# Patient Record
Sex: Female | Born: 1935 | Race: White | Hispanic: No | Smoking: Former smoker
Health system: Southern US, Community
[De-identification: ages and names within clinical notes are randomized; demographics above are authoritative.]

## PROBLEM LIST (undated history)

## (undated) DIAGNOSIS — Z800 Family history of malignant neoplasm of digestive organs: Secondary | ICD-10-CM

## (undated) DIAGNOSIS — Z803 Family history of malignant neoplasm of breast: Secondary | ICD-10-CM

## (undated) DIAGNOSIS — N183 Chronic kidney disease, stage 3 unspecified: Secondary | ICD-10-CM

## (undated) DIAGNOSIS — K56609 Unspecified intestinal obstruction, unspecified as to partial versus complete obstruction: Secondary | ICD-10-CM

## (undated) DIAGNOSIS — M8580 Other specified disorders of bone density and structure, unspecified site: Secondary | ICD-10-CM

## (undated) HISTORY — DX: Family history of malignant neoplasm of breast: Z80.3

## (undated) HISTORY — DX: Family history of malignant neoplasm of digestive organs: Z80.0

## (undated) HISTORY — PX: GLAUCOMA SURGERY: SHX656

## (undated) HISTORY — PX: COLECTOMY: SHX59

## (undated) HISTORY — PX: HIP SURGERY: SHX245

## (undated) HISTORY — PX: APPENDECTOMY: SHX54

## (undated) HISTORY — DX: Other specified disorders of bone density and structure, unspecified site: M85.80

---

## 1998-08-14 ENCOUNTER — Ambulatory Visit (HOSPITAL_COMMUNITY): Admission: RE | Admit: 1998-08-14 | Discharge: 1998-08-14 | Payer: Self-pay | Admitting: Gastroenterology

## 1999-08-10 ENCOUNTER — Emergency Department (HOSPITAL_COMMUNITY): Admission: EM | Admit: 1999-08-10 | Discharge: 1999-08-10 | Payer: Self-pay | Admitting: Emergency Medicine

## 1999-09-10 ENCOUNTER — Encounter: Payer: Self-pay | Admitting: Family Medicine

## 1999-09-10 ENCOUNTER — Encounter: Admission: RE | Admit: 1999-09-10 | Discharge: 1999-09-10 | Payer: Self-pay | Admitting: Family Medicine

## 2000-09-14 ENCOUNTER — Encounter: Payer: Self-pay | Admitting: Internal Medicine

## 2000-09-14 ENCOUNTER — Encounter: Admission: RE | Admit: 2000-09-14 | Discharge: 2000-09-14 | Payer: Self-pay | Admitting: Internal Medicine

## 2001-07-22 ENCOUNTER — Emergency Department (HOSPITAL_COMMUNITY): Admission: EM | Admit: 2001-07-22 | Discharge: 2001-07-23 | Payer: Self-pay | Admitting: Anesthesiology

## 2001-07-23 ENCOUNTER — Ambulatory Visit (HOSPITAL_COMMUNITY): Admission: RE | Admit: 2001-07-23 | Discharge: 2001-07-23 | Payer: Self-pay

## 2001-07-23 ENCOUNTER — Encounter: Payer: Self-pay | Admitting: Emergency Medicine

## 2001-09-28 ENCOUNTER — Encounter: Payer: Self-pay | Admitting: *Deleted

## 2001-09-28 ENCOUNTER — Encounter: Admission: RE | Admit: 2001-09-28 | Discharge: 2001-09-28 | Payer: Self-pay | Admitting: *Deleted

## 2001-10-06 ENCOUNTER — Other Ambulatory Visit: Admission: RE | Admit: 2001-10-06 | Discharge: 2001-10-06 | Payer: Self-pay | Admitting: *Deleted

## 2002-03-13 ENCOUNTER — Ambulatory Visit (HOSPITAL_COMMUNITY): Admission: RE | Admit: 2002-03-13 | Discharge: 2002-03-13 | Payer: Self-pay | Admitting: Gastroenterology

## 2002-03-13 ENCOUNTER — Encounter (INDEPENDENT_AMBULATORY_CARE_PROVIDER_SITE_OTHER): Payer: Self-pay

## 2002-10-02 ENCOUNTER — Encounter: Admission: RE | Admit: 2002-10-02 | Discharge: 2002-10-02 | Payer: Self-pay | Admitting: Family Medicine

## 2002-10-02 ENCOUNTER — Encounter: Payer: Self-pay | Admitting: Family Medicine

## 2003-10-04 ENCOUNTER — Ambulatory Visit (HOSPITAL_COMMUNITY): Admission: RE | Admit: 2003-10-04 | Discharge: 2003-10-04 | Payer: Self-pay | Admitting: Family Medicine

## 2004-10-07 ENCOUNTER — Ambulatory Visit (HOSPITAL_COMMUNITY): Admission: RE | Admit: 2004-10-07 | Discharge: 2004-10-07 | Payer: Self-pay | Admitting: *Deleted

## 2005-03-19 ENCOUNTER — Other Ambulatory Visit: Admission: RE | Admit: 2005-03-19 | Discharge: 2005-03-19 | Payer: Self-pay | Admitting: *Deleted

## 2005-12-17 ENCOUNTER — Ambulatory Visit (HOSPITAL_COMMUNITY): Admission: RE | Admit: 2005-12-17 | Discharge: 2005-12-17 | Payer: Self-pay | Admitting: *Deleted

## 2006-12-21 ENCOUNTER — Ambulatory Visit (HOSPITAL_COMMUNITY): Admission: RE | Admit: 2006-12-21 | Discharge: 2006-12-21 | Payer: Self-pay | Admitting: *Deleted

## 2008-01-10 ENCOUNTER — Ambulatory Visit (HOSPITAL_COMMUNITY): Admission: RE | Admit: 2008-01-10 | Discharge: 2008-01-10 | Payer: Self-pay | Admitting: *Deleted

## 2008-02-16 ENCOUNTER — Encounter: Admission: RE | Admit: 2008-02-16 | Discharge: 2008-02-16 | Payer: Self-pay | Admitting: Family Medicine

## 2008-02-24 ENCOUNTER — Encounter: Admission: RE | Admit: 2008-02-24 | Discharge: 2008-02-24 | Payer: Self-pay | Admitting: Family Medicine

## 2008-06-12 ENCOUNTER — Encounter: Admission: RE | Admit: 2008-06-12 | Discharge: 2008-06-12 | Payer: Self-pay | Admitting: Family Medicine

## 2009-01-11 ENCOUNTER — Ambulatory Visit (HOSPITAL_COMMUNITY): Admission: RE | Admit: 2009-01-11 | Discharge: 2009-01-11 | Payer: Self-pay | Admitting: Family Medicine

## 2009-03-28 ENCOUNTER — Encounter: Admission: RE | Admit: 2009-03-28 | Discharge: 2009-03-28 | Payer: Self-pay | Admitting: Family Medicine

## 2009-10-28 ENCOUNTER — Emergency Department (HOSPITAL_COMMUNITY): Admission: EM | Admit: 2009-10-28 | Discharge: 2009-10-29 | Payer: Self-pay | Admitting: Emergency Medicine

## 2010-01-13 ENCOUNTER — Ambulatory Visit (HOSPITAL_COMMUNITY): Admission: RE | Admit: 2010-01-13 | Discharge: 2010-01-13 | Payer: Self-pay | Admitting: Family Medicine

## 2010-05-01 ENCOUNTER — Encounter: Admission: RE | Admit: 2010-05-01 | Discharge: 2010-05-01 | Payer: Self-pay | Admitting: Family Medicine

## 2010-06-13 ENCOUNTER — Ambulatory Visit: Payer: Self-pay | Admitting: Vascular Surgery

## 2010-10-15 LAB — BASIC METABOLIC PANEL
BUN: 25 mg/dL — ABNORMAL HIGH (ref 6–23)
CO2: 25 mEq/L (ref 19–32)
Glucose, Bld: 147 mg/dL — ABNORMAL HIGH (ref 70–99)
Potassium: 3.9 mEq/L (ref 3.5–5.1)
Sodium: 133 mEq/L — ABNORMAL LOW (ref 135–145)

## 2010-10-15 LAB — URINALYSIS, ROUTINE W REFLEX MICROSCOPIC
Bilirubin Urine: NEGATIVE
Glucose, UA: NEGATIVE mg/dL
Hgb urine dipstick: NEGATIVE
Ketones, ur: NEGATIVE mg/dL
pH: 5 (ref 5.0–8.0)

## 2010-10-15 LAB — DIFFERENTIAL
Basophils Absolute: 0 10*3/uL (ref 0.0–0.1)
Lymphocytes Relative: 6 % — ABNORMAL LOW (ref 12–46)
Lymphs Abs: 0.9 10*3/uL (ref 0.7–4.0)
Monocytes Absolute: 0.6 10*3/uL (ref 0.1–1.0)
Monocytes Relative: 4 % (ref 3–12)

## 2010-10-15 LAB — CBC
HCT: 31.9 % — ABNORMAL LOW (ref 36.0–46.0)
MCHC: 33.2 g/dL (ref 30.0–36.0)
RDW: 13.1 % (ref 11.5–15.5)

## 2010-11-27 ENCOUNTER — Other Ambulatory Visit: Payer: Self-pay | Admitting: Gastroenterology

## 2010-12-12 NOTE — Op Note (Signed)
   TNAMEGLADA, MATCHETTE                     ACCOUNT NO.:  [id number]   MEDICAL RECORD NO.:  [id number]                   PATIENT TYPE:  AMB   LOCATION:  ENDO                                 FACILITY:  [facility]   PHYSICIAN:  Missy Sabins, M.D.                  DATE OF BIRTH:  12-31-1931   DATE OF PROCEDURE:  03/13/2002  DATE OF DISCHARGE:                                 OPERATIVE REPORT   PROCEDURE:  Colonoscopy with polypectomy.   INDICATIONS FOR PROCEDURE:  History of adenomatous colon polyps.   DESCRIPTION OF PROCEDURE:  The patient was placed in the left lateral  decubitus position then placed on the pulse monitor with continuous low flow  oxygen delivered by nasal cannula. She was sedated with 70 mg IV Demerol and  7 mg IV Versed. The Olympus video colonoscope was inserted into the rectum  and advanced to the cecum, confirmed by transillumination at McBurney's  point and visualization of the ileocecal valve and appendiceal orifice. The  prep was generally good. The cecum and ascending colon appeared normal with  no masses, polyps, diverticula or other mucosal abnormalities. We looked in  the transverse colon and there was seen a 1.5 cm polyp that was removed by  snare. Opposite this, there was an 8 mm polyp which was removed by hot  biopsy. The remainder of the transverse, descending, sigmoid and rectum  appeared normal without further polyps, masses, diverticula or other mucosal  abnormalities. Within the rectum, there was an 8 mm sessile polyp which was  fulgurated by hot biopsy. The scope was then withdrawn and the patient  returned to the recovery room in stable condition. The patient tolerated the  procedure well and there were no immediate complications.   IMPRESSION:  Transverse and rectal polyps.   PLAN:  Await histology for determination of interval for next colonoscopy.                                               Missy Sabins, M.D.    JCH/MEDQ  D:   03/13/2002  T:  03/13/2002  Job:  [id number]

## 2010-12-22 DIAGNOSIS — K56699 Other intestinal obstruction unspecified as to partial versus complete obstruction: Secondary | ICD-10-CM | POA: Insufficient documentation

## 2010-12-24 ENCOUNTER — Other Ambulatory Visit: Payer: Self-pay | Admitting: Gastroenterology

## 2010-12-24 ENCOUNTER — Other Ambulatory Visit (HOSPITAL_COMMUNITY): Payer: Self-pay | Admitting: Gastroenterology

## 2010-12-24 DIAGNOSIS — R197 Diarrhea, unspecified: Secondary | ICD-10-CM

## 2010-12-25 ENCOUNTER — Ambulatory Visit (HOSPITAL_COMMUNITY)
Admission: RE | Admit: 2010-12-25 | Discharge: 2010-12-25 | Disposition: A | Discharge status: Home / Self Care | Payer: Medicare Other | Source: Ambulatory Visit | Attending: Gastroenterology | Admitting: Gastroenterology

## 2010-12-25 ENCOUNTER — Inpatient Hospital Stay
Admission: RE | Admit: 2010-12-25 | Discharge: 2010-12-25 | Payer: Self-pay | Source: Ambulatory Visit | Attending: Gastroenterology | Admitting: Gastroenterology

## 2010-12-25 DIAGNOSIS — K449 Diaphragmatic hernia without obstruction or gangrene: Secondary | ICD-10-CM | POA: Insufficient documentation

## 2010-12-25 DIAGNOSIS — R197 Diarrhea, unspecified: Secondary | ICD-10-CM

## 2010-12-25 DIAGNOSIS — M47817 Spondylosis without myelopathy or radiculopathy, lumbosacral region: Secondary | ICD-10-CM | POA: Insufficient documentation

## 2010-12-25 DIAGNOSIS — N269 Renal sclerosis, unspecified: Secondary | ICD-10-CM | POA: Insufficient documentation

## 2010-12-25 DIAGNOSIS — I7090 Unspecified atherosclerosis: Secondary | ICD-10-CM | POA: Insufficient documentation

## 2010-12-25 DIAGNOSIS — K639 Disease of intestine, unspecified: Secondary | ICD-10-CM | POA: Insufficient documentation

## 2010-12-25 DIAGNOSIS — N83209 Unspecified ovarian cyst, unspecified side: Secondary | ICD-10-CM | POA: Insufficient documentation

## 2010-12-25 MED ORDER — IOHEXOL 300 MG/ML  SOLN
100.0000 mL | Freq: Once | INTRAMUSCULAR | Status: AC | PRN
  Administered 2010-12-25: 100 mL via INTRAVENOUS

## 2011-01-14 ENCOUNTER — Other Ambulatory Visit (HOSPITAL_COMMUNITY): Payer: Self-pay | Admitting: Family Medicine

## 2011-01-14 DIAGNOSIS — Z1231 Encounter for screening mammogram for malignant neoplasm of breast: Secondary | ICD-10-CM

## 2011-01-19 ENCOUNTER — Other Ambulatory Visit: Payer: Self-pay | Admitting: Gastroenterology

## 2011-01-22 ENCOUNTER — Other Ambulatory Visit: Payer: Medicare Other

## 2011-01-23 ENCOUNTER — Ambulatory Visit (HOSPITAL_COMMUNITY): Payer: Medicare Other

## 2011-01-27 ENCOUNTER — Ambulatory Visit (HOSPITAL_COMMUNITY)
Admission: RE | Admit: 2011-01-27 | Discharge: 2011-01-27 | Disposition: A | Discharge status: Home / Self Care | Payer: Medicare Other | Source: Ambulatory Visit | Attending: Family Medicine | Admitting: Family Medicine

## 2011-01-27 ENCOUNTER — Ambulatory Visit
Admission: RE | Admit: 2011-01-27 | Discharge: 2011-01-27 | Disposition: A | Discharge status: Home / Self Care | Payer: Medicare Other | Source: Ambulatory Visit | Attending: Gastroenterology | Admitting: Gastroenterology

## 2011-01-27 DIAGNOSIS — Z1231 Encounter for screening mammogram for malignant neoplasm of breast: Secondary | ICD-10-CM | POA: Insufficient documentation

## 2011-01-27 MED ORDER — IOHEXOL 300 MG/ML  SOLN
100.0000 mL | Freq: Once | INTRAMUSCULAR | Status: AC | PRN
  Administered 2011-01-27: 100 mL via INTRAVENOUS

## 2011-02-04 ENCOUNTER — Other Ambulatory Visit: Payer: Self-pay | Admitting: Family Medicine

## 2011-02-04 DIAGNOSIS — R928 Other abnormal and inconclusive findings on diagnostic imaging of breast: Secondary | ICD-10-CM

## 2011-02-06 ENCOUNTER — Ambulatory Visit
Admission: RE | Admit: 2011-02-06 | Discharge: 2011-02-06 | Disposition: A | Discharge status: Home / Self Care | Payer: Medicare Other | Source: Ambulatory Visit | Attending: Family Medicine | Admitting: Family Medicine

## 2011-02-06 DIAGNOSIS — R928 Other abnormal and inconclusive findings on diagnostic imaging of breast: Secondary | ICD-10-CM

## 2011-03-06 ENCOUNTER — Other Ambulatory Visit: Payer: Self-pay | Admitting: Gastroenterology

## 2011-03-23 ENCOUNTER — Encounter (INDEPENDENT_AMBULATORY_CARE_PROVIDER_SITE_OTHER): Payer: Self-pay | Admitting: Surgery

## 2011-03-24 ENCOUNTER — Encounter (INDEPENDENT_AMBULATORY_CARE_PROVIDER_SITE_OTHER): Payer: Self-pay | Admitting: Surgery

## 2011-03-24 ENCOUNTER — Ambulatory Visit (INDEPENDENT_AMBULATORY_CARE_PROVIDER_SITE_OTHER): Payer: Medicare Other | Admitting: Surgery

## 2011-03-24 VITALS — BP 122/68 | HR 76 | Temp 96.7°F | Ht 60.0 in | Wt 114.6 lb

## 2011-03-24 DIAGNOSIS — K56609 Unspecified intestinal obstruction, unspecified as to partial versus complete obstruction: Secondary | ICD-10-CM

## 2011-03-24 DIAGNOSIS — K56699 Other intestinal obstruction unspecified as to partial versus complete obstruction: Secondary | ICD-10-CM

## 2011-03-24 NOTE — Progress Notes (Signed)
Chief Complaint  Patient presents with  . Other    Eval of col mass possible benign    HPI Laurie [name] is a 77 y.o. female.  This patient comes over for evaluation of a problem with her sigmoid colon. She is referred by [name].  She began having some symptoms of loose stools after E. and rectal bleeding with weight loss and was evaluated in April of 2012. At that time she had a slightly low hemoglobin of 10.8. She subsequently had an upper endoscopy which was negative and biopsies showed no features of sprue or active inflammation. She then underwent a colonoscopy which showed mild inflammation in the area of the sigmoid colon. A CT scan showed what looked like an apple core lesion in the area. Biopsies of the sigmoid showed mild inflammation but no evidence of tumor. She was treated with a couple of courses of Cipro and Flagyl. All of her abdominal symptoms have resolved and her abdominal pain is gone and she is having no problems with bowel movements. She returned for subsequent followup and had a second CT scan which basically unchanged from the original. Another attempt at colonoscopy showed inflammation at the area of the sigmoid and could not be passed. She has not had a barium enema.  She comes in today to discuss whether any surgical intervention is appropriate. She remains asymptomatic in terms of her abdomen. HPI  Past Medical History  Diagnosis Date  . Osteopenia   . Glaucoma   . Family history of breast cancer     mother  . Family history of colon cancer     father    Past Surgical History  Procedure Date  . Appendectomy   . Cesarean section     x6    Family History  Problem Relation Age of Onset  . Colon cancer Father   . Breast cancer Mother   . Diabetes Sister     Social History History  Substance Use Topics  . Smoking status: Current Everyday Smoker -- 0.5 packs/day  . Smokeless tobacco: Not on file  . Alcohol Use: No     rare    Allergies  Allergen Reactions  . Actonel Nausea And Vomiting  . Fosamax Nausea And Vomiting  . Miacalcin   . Neosporin (Neomycin-Polymyx-Gramicid) Swelling  . Codeine Palpitations    Current Outpatient Prescriptions  Medication Sig Dispense Refill  . aspirin 81 MG tablet Take 81 mg by mouth daily.        . dorzolamide-timolol (COSOPT) 22.3-6.8 MG/ML ophthalmic solution 1 drop 2 (two) times daily.        . Vitamin D, Ergocalciferol, (DRISDOL) 50000 UNITS CAPS Take 50,000 Units by mouth.        . hyoscyamine (ANASPAZ) 0.125 MG TBDP Place 0.125 mg under the tongue as needed.        . Travoprost, BAK Free, (TRAVATAMN) 0.004 % SOLN ophthalmic solution 1 drop at bedtime.          Review of Systems ROSHer 15 point ROS is basically negative  Blood pressure 122/68, pulse 76, temperature 96.7 F (35.9 C), temperature source Temporal, height 5' (1.524 m), weight 114 lb 9.6 oz (51.982 kg).  Physical Exam Physical Exam  GENERAL: The patient is alert, oriented, and generally healthy-appearing, NAD. Mood and affect are normal.  HEENT: The head is normocephalic, the eyes nonicteric, the pupils were round regular and equal. EOMs are normal. Pharynx normal. Dentition good.  NECK: The  neck is supple and there are no masses or thyromegaly.  LUNGS: Normal respirations and clear to auscultation.  HEART: Regular rhythm, with no murmurs rubs or gallops. Pulses are intact carotid dorsalis pedis and posterior tibial. No significant varicosities are noted.  ABDOMEN: Soft, flat, and nontender. No masses or organomegaly is noted. No hernias are noted. Bowel sounds are normal.There is some question of some tubular thickening in the LLQ in the presumed location of the sigmoid colon  EXTREMITIES: Good range of motion, no edema.  Data Reviewed I've reviewed over office notes from three or four office visits with her gastroenterologist, her pathology reports, her CT scan reports and films, and  her endoscopy/colonoscopy notes. Assessment    She appears to have some abnormality in the distal sigmoid area that looks like some form of a stricture. At least by the colonoscopy there did not appear to be an intrinsic mass in the colon although the CT scan is certainly suggestive for a colon carcinoma.    Plan    I think she is likely going to need exploration, possibly laparoscopic. However I would like to have a barium enema just to see if we can make any better definition of this area and to see if it would distend any under pressure. I got over that with the patient and she is agreeable to that plan. We will try to get this set up to be done at her convenience.       Laurie [name] 03/24/2011, 2:59 PM

## 2011-04-01 ENCOUNTER — Other Ambulatory Visit (INDEPENDENT_AMBULATORY_CARE_PROVIDER_SITE_OTHER): Payer: Self-pay | Admitting: Surgery

## 2011-04-01 ENCOUNTER — Ambulatory Visit
Admission: RE | Admit: 2011-04-01 | Discharge: 2011-04-01 | Disposition: A | Discharge status: Home / Self Care | Payer: Medicare Other | Source: Ambulatory Visit | Attending: Surgery | Admitting: Surgery

## 2011-04-01 DIAGNOSIS — K56699 Other intestinal obstruction unspecified as to partial versus complete obstruction: Secondary | ICD-10-CM

## 2011-04-03 ENCOUNTER — Telehealth (INDEPENDENT_AMBULATORY_CARE_PROVIDER_SITE_OTHER): Payer: Self-pay | Admitting: Surgery

## 2011-04-03 NOTE — Telephone Encounter (Signed)
I reviewed her barium enema films. There is a long stricture of uncertain etiology. Combined with the previous information I think she needs an exploratory laparotomy and sigmoid resection. Although she is asymptomatic I'm concerned about the length of the stricture and the potential for some extrinsic tumor although this could represent, I suppose, a long diverticular stricture.  I have left her message to give me a call since she was not home when I called her today. I reviewed with her at that point in time.

## 2011-04-06 ENCOUNTER — Telehealth (INDEPENDENT_AMBULATORY_CARE_PROVIDER_SITE_OTHER): Payer: Self-pay | Admitting: Surgery

## 2011-04-06 NOTE — Telephone Encounter (Signed)
Patient called re: speaking to Dr Margot Chimes. I informed her he would like to speak with her about her test results. He will be in the office tomorrow and I will have him call her. She has some appts but will be available after 3 pm.

## 2011-04-07 ENCOUNTER — Telehealth (INDEPENDENT_AMBULATORY_CARE_PROVIDER_SITE_OTHER): Payer: Self-pay | Admitting: Surgery

## 2011-04-07 DIAGNOSIS — K56699 Other intestinal obstruction unspecified as to partial versus complete obstruction: Secondary | ICD-10-CM

## 2011-04-07 NOTE — Telephone Encounter (Signed)
I called the patient today and reviewed the findings of her barium enema. Basically shows a sigmoid stricture with what appears to be perhaps extrinsic compression.  I told her that based on this, and the findings at her colonoscopy, that she on to undergo sigmoid colectomy. I went over the surgery including risks and complications. I did tell her that surgery I think she may at some point obstruct. I also told her that we don't have a clear etiology, this could be chronic inflammatory changes from diverticular disease or some sort of cancer.  I told her we will try to do this as a laparoscopic assisted, but that she might need a standard open operation. I told her to expect up to six or eight days in the hospital and at least six weeks of recovery to a full recovery.  I think all questions have been answered. We will try to set this up to be done at her convenience.

## 2011-04-20 ENCOUNTER — Telehealth (INDEPENDENT_AMBULATORY_CARE_PROVIDER_SITE_OTHER): Payer: Self-pay | Admitting: General Surgery

## 2011-04-20 NOTE — Telephone Encounter (Signed)
Patient called to see if she could mix her prep with apple juice instead of gatorade. Per Dr Zella Richer the enzymes in the gatorade are needed and the pectin in apple juice would be bad. She needs to use gatorade. Patient agrees with this.

## 2011-04-24 ENCOUNTER — Encounter (HOSPITAL_COMMUNITY)
Admission: RE | Admit: 2011-04-24 | Discharge: 2011-04-24 | Disposition: A | Discharge status: Home / Self Care | Payer: Medicare Other | Source: Ambulatory Visit | Attending: Surgery | Admitting: Surgery

## 2011-04-24 ENCOUNTER — Other Ambulatory Visit (INDEPENDENT_AMBULATORY_CARE_PROVIDER_SITE_OTHER): Payer: Self-pay | Admitting: Surgery

## 2011-04-24 ENCOUNTER — Telehealth (INDEPENDENT_AMBULATORY_CARE_PROVIDER_SITE_OTHER): Payer: Self-pay

## 2011-04-24 ENCOUNTER — Telehealth (INDEPENDENT_AMBULATORY_CARE_PROVIDER_SITE_OTHER): Payer: Self-pay | Admitting: General Surgery

## 2011-04-24 DIAGNOSIS — K56609 Unspecified intestinal obstruction, unspecified as to partial versus complete obstruction: Secondary | ICD-10-CM

## 2011-04-24 LAB — CBC
Hemoglobin: 12.5 g/dL (ref 12.0–15.0)
MCH: 30.1 pg (ref 26.0–34.0)
Platelets: 317 10*3/uL (ref 150–400)
RBC: 4.15 MIL/uL (ref 3.87–5.11)
WBC: 11.4 10*3/uL — ABNORMAL HIGH (ref 4.0–10.5)

## 2011-04-24 LAB — URINE MICROSCOPIC-ADD ON

## 2011-04-24 LAB — URINALYSIS, ROUTINE W REFLEX MICROSCOPIC
Bilirubin Urine: NEGATIVE
Ketones, ur: NEGATIVE mg/dL
Specific Gravity, Urine: 1.015 (ref 1.005–1.030)
pH: 5 (ref 5.0–8.0)

## 2011-04-24 LAB — DIFFERENTIAL
Basophils Absolute: 0 10*3/uL (ref 0.0–0.1)
Basophils Relative: 0 % (ref 0–1)
Eosinophils Absolute: 0.3 10*3/uL (ref 0.0–0.7)
Monocytes Relative: 8 % (ref 3–12)
Neutro Abs: 7.8 10*3/uL — ABNORMAL HIGH (ref 1.7–7.7)
Neutrophils Relative %: 69 % (ref 43–77)

## 2011-04-24 LAB — COMPREHENSIVE METABOLIC PANEL
ALT: 9 U/L (ref 0–35)
AST: 12 U/L (ref 0–37)
Alkaline Phosphatase: 79 U/L (ref 39–117)
CO2: 28 mEq/L (ref 19–32)
Calcium: 9.3 mg/dL (ref 8.4–10.5)
GFR calc non Af Amer: 36 mL/min — ABNORMAL LOW (ref >60)
Potassium: 3.8 mEq/L (ref 3.5–5.1)
Sodium: 140 mEq/L (ref 135–145)

## 2011-04-24 LAB — SURGICAL PCR SCREEN
MRSA, PCR: NEGATIVE
Staphylococcus aureus: POSITIVE — AB

## 2011-04-24 NOTE — Telephone Encounter (Signed)
Labs okay for surgery faxed to pre-op.

## 2011-04-24 NOTE — Telephone Encounter (Signed)
Message copied by Margarette Asal on Fri Apr 24, 2011  2:39 PM ------      Message from: Haywood Lasso      Created: Fri Apr 24, 2011  2:20 PM       These labs are OK for surgery

## 2011-04-24 NOTE — Telephone Encounter (Signed)
The Pharmacist called and stated the pt is allergic to Neomycin.  She was prescribed this for her bowel prep.  I spoke to Dr Margot Chimes and he advised to omit that part of the prescription and just give Erythromycin.  I called this to [facility]

## 2011-04-29 ENCOUNTER — Telehealth (INDEPENDENT_AMBULATORY_CARE_PROVIDER_SITE_OTHER): Payer: Self-pay | Admitting: General Surgery

## 2011-04-29 NOTE — Telephone Encounter (Signed)
Called pt to advise that the bowel prep was to remain the same based on doctor instruction. Advised her she had to complete the prep and antibiotic prescription as instructed in order to avoid complications from surgery.   Instruction regarding bowel prep was per Dr. Donne Hazel who was given a copy of the sx order for review prior to calling pt with answer.

## 2011-04-29 NOTE — Telephone Encounter (Signed)
Pt called in complaining of nausea due to bowel prep. Stated she has not started taking her antibiotic either. Stated she tried eating some chicken soup about an hour ago to see if it would help her. Advised patient prep needed to be completed, in addition to taking the antibiotic prescribed in order to be ready for surgery tomorrow. Advised pt I would call her back at [phone] as soon as I was able to get a doctor to approve a prescription for anti nausea medication.

## 2011-04-30 ENCOUNTER — Inpatient Hospital Stay (HOSPITAL_COMMUNITY)
Admission: RE | Admit: 2011-04-30 | Discharge: 2011-05-06 | DRG: 330 | Disposition: A | Discharge status: Home / Self Care | Payer: Medicare Other | Source: Ambulatory Visit | Attending: Surgery | Admitting: Surgery

## 2011-04-30 ENCOUNTER — Encounter (INDEPENDENT_AMBULATORY_CARE_PROVIDER_SITE_OTHER): Payer: Self-pay | Admitting: Surgery

## 2011-04-30 ENCOUNTER — Other Ambulatory Visit (INDEPENDENT_AMBULATORY_CARE_PROVIDER_SITE_OTHER): Payer: Self-pay | Admitting: Surgery

## 2011-04-30 DIAGNOSIS — K5669 Other intestinal obstruction: Secondary | ICD-10-CM | POA: Diagnosis present

## 2011-04-30 DIAGNOSIS — Z5331 Laparoscopic surgical procedure converted to open procedure: Secondary | ICD-10-CM

## 2011-04-30 DIAGNOSIS — K5732 Diverticulitis of large intestine without perforation or abscess without bleeding: Principal | ICD-10-CM | POA: Diagnosis present

## 2011-04-30 DIAGNOSIS — Z23 Encounter for immunization: Secondary | ICD-10-CM

## 2011-04-30 DIAGNOSIS — D282 Benign neoplasm of uterine tubes and ligaments: Secondary | ICD-10-CM

## 2011-04-30 DIAGNOSIS — Z87891 Personal history of nicotine dependence: Secondary | ICD-10-CM

## 2011-04-30 DIAGNOSIS — Z860100 Personal history of colon polyps, unspecified: Secondary | ICD-10-CM

## 2011-04-30 DIAGNOSIS — Z01812 Encounter for preprocedural laboratory examination: Secondary | ICD-10-CM

## 2011-04-30 DIAGNOSIS — Z01818 Encounter for other preprocedural examination: Secondary | ICD-10-CM

## 2011-04-30 HISTORY — PX: OTHER SURGICAL HISTORY: SHX169

## 2011-04-30 HISTORY — PX: LEFT COLECTOMY: SHX856

## 2011-04-30 LAB — TYPE AND SCREEN: ABO/RH(D): B NEG

## 2011-05-02 LAB — BASIC METABOLIC PANEL
BUN: 12 mg/dL (ref 6–23)
Calcium: 8.5 mg/dL (ref 8.4–10.5)
Creatinine, Ser: 1.26 mg/dL — ABNORMAL HIGH (ref 0.50–1.10)
GFR calc non Af Amer: 40 mL/min — ABNORMAL LOW (ref >90)
Glucose, Bld: 122 mg/dL — ABNORMAL HIGH (ref 70–99)

## 2011-05-02 LAB — CBC
HCT: 30.2 % — ABNORMAL LOW (ref 36.0–46.0)
Hemoglobin: 10.1 g/dL — ABNORMAL LOW (ref 12.0–15.0)
MCH: 29.7 pg (ref 26.0–34.0)
MCHC: 33.4 g/dL (ref 30.0–36.0)
MCV: 88.8 fL (ref 78.0–100.0)

## 2011-05-03 LAB — BASIC METABOLIC PANEL
BUN: 7 mg/dL (ref 6–23)
Calcium: 8.5 mg/dL (ref 8.4–10.5)
Chloride: 103 mEq/L (ref 96–112)
Creatinine, Ser: 1.04 mg/dL (ref 0.50–1.10)
GFR calc Af Amer: 59 mL/min — ABNORMAL LOW (ref >90)
GFR calc non Af Amer: 50 mL/min — ABNORMAL LOW (ref >90)

## 2011-05-06 NOTE — Discharge Summary (Signed)
  NAMEELTA, KAZAR NO.:  [id number]  MEDICAL RECORD NO.:  [id number]  LOCATION:  [id number]                         FACILITY:  [facility]  PHYSICIAN:  Haywood Lasso, M.D.DATE OF BIRTH:  1932/01/24  DATE OF ADMISSION:  04/30/2011 DATE OF DISCHARGE:  05/06/2011                              DISCHARGE SUMMARY   FINAL DIAGNOSIS:  Severe left colonic diverticulitis and diverticulosis with stricture. Also Serous cystadenoma left fallopian tube  CLINICAL HISTORY:  This patient was recently diagnosed with a significant sigmoid colon obstruction that was near-complete, although she was relatively asymptomatic.  It was thought to be more inflammatory than malignant.  After discussion with the patient, she was elected to be admitted for resection.  HOSPITAL COURSE:  The patient was admitted and taken to the operating room.  Operative findings including a very long inflammatory process of the sigmoid colon looked chronic with structure extending basically from the proximal descending colon to the rectosigmoid junction.  She underwent a left hemicolectomy with takedown of her splenic flexure, and a low anterior anastomosis.  In addition, a fallopian tube cystic mass was seen and this was removed as well.  The patient had an unremarkable postoperative course.  She was kept n.p.o. for a couple of days, begun on liquids and gradually advanced to solids. She underwent normal respiratory toilet.  Her wound was healing nicely and her wicks were removed on the 5th postoperative day.  She is passing gas and having bowel movements and tolerating diet.  She requested to be discharged today and she is feeling well.  Pathology report confirmed diverticulitis with a benign ovarian cyst.  Plan is for her to go home today to be followed in my office next week. She knows to call if there are any problems or concerns.     Haywood Lasso, M.D.     CJS/MEDQ  D:  05/06/2011   T:  05/06/2011  Job:  [id number]  cc:   Elyse Jarvis. Amedeo Plenty, M.D.  Electronically Signed by Neldon Mc M.D. on 05/06/2011 07:17:20 PM

## 2011-05-06 NOTE — Op Note (Signed)
Laurie [name], Laurie [name]            ACCOUNT NO.:  [id number]  MEDICAL RECORD NO.:  [id number]  LOCATION:  [id number]                         FACILITY:  [facility]  PHYSICIAN:  Haywood Lasso, M.D.DATE OF BIRTH:  1932-05-14  DATE OF PROCEDURE:  04/30/2011 DATE OF DISCHARGE:                              OPERATIVE REPORT   PREOPERATIVE DIAGNOSIS:  Stricture of sigmoid colon likely diverticulitis.  POSTOPERATIVE DIAGNOSIS:  Stricture of sigmoid colon likely diverticulitis plus left fallopian tube cyst.  PROCEDURE: 1. Left hemicolectomy with takedown of splenic flexure and low     anterior anastomosis. 2. Resection of left fallopian tube cystic mass.  SURGEON:  Haywood Lasso, MD  ASSISTANT:  Dr. Rise Patience.  ANESTHESIA:  General endotracheal.  CLINICAL HISTORY:  This lady has a what appears to be stricture with almost an apple core lesion on CT that has become relatively asymptomatic, but because of its ongoing nature and the fact we were concerned there might be tumor there, we recommended that she have a sigmoid colectomy.  She had been scoped, but they were unable to get passed the scope past this area of obstruction.  DESCRIPTION OF PROCEDURE:  I saw the patient in the holding area and reviewed the plans for the surgery as noted above.  The patient was taken to the operating room, after satisfactory general endotracheal anesthesia had been obtained, Foley catheter was placed and the abdomen prepped and draped.  We put her in some modified lithotomy.  After the time-out was done I went ahead and made a small incision below the umbilicus and identified the fascia and entered the peritoneal cavity under direct vision.  A pursestring was placed, the Hasson introduced and the abdomen insufflated to 15.  Camera was placed and we saw there was some inflammatory process along the left side, but could not get good visualization.  Under direct vision, I put a 5-mm trocar in the  right upper and right lower quadrants.  Upon doing this, I was able to mobilize the small bowel off and see that we had a very chronically inflamed thickened sigmoid that had no mobility whatsoever.  We tried to move this around, tried to free up a little bit of peritoneum, but I decided this was going to be better done open.  The laparoscopic instruments were removed.  The midline incision was made from just above the umbilicus at her old C-section scar to just above the symphysis.  Omental adhesions of the upper midline were taken down and self-retaining retractor was placed.  I could feel that the area of involvement extended right down to the rectosigmoid junction inferiorly and at most to the left colon proximally.  I mobilized the left colon off of the lateral abdominal wall opening the line of Toldt towards the splenic flexure.  I could see clearly we need to take the splenic flexure down and visualization was not good, so I extended the incision another inch and a half above the umbilicus.  I then took all the omental adhesions down.  Once that was done, I was able to mobilize around the splenic flexure using a combination of cautery and LigaSure.  Once I had that down, I put  a pack up to make she was not having bleeding around the spleen.  We checked that two additional times later the case and things appeared to be dry.  There were multiple omental adhesions to the colon as well as the normal attachment of the omentum.  These were all taken down so I had good mobility.  I then found an area that was proximal to any of the thickening and I divided the bowel here with the GIA.  I then became divided the mesentery staying fairly close to the bowel as I thought this was all chronic diverticular disease and not cancer until we got to the mid descending colon.  Earlier I had freed up the sigmoid somewhat identified the ureter and I reidentified it to make sure we are going  to stay well away from that. I also identified the right ureter it was well retroperitoneal and lateral to where we were working.  I then continue to using the LigaSure, but any large vessels were tied or suture ligated until I got beyond the distal area of inflammatory process and where I was in the proximal rectum.  I checked this carefully make sure that we had a healthy bowel.  I then used a stapler (contour) to divide the bowel distally and retracted a low bit up into the rectal pouch.  I checked to make sure I had adequate mobility of the proximal colon and this laid easily down into the pelvis with no tension whatsoever.  I cleaned a little bit more the mesentery off where I had stapled it and put the pursestring device across, cut off the excess and placed a pursestring of Prolene.  We used a 33 stapler and I put the anvil in and tied the pursestring down a little bit and we are going to have a nice proximal area.  At this point, my assistant, Dr. Rise Patience, went below.  He dilated the rectal a little bit and then put the stapler up through the anus.  I could see the stapler impinging and coming up on stretching out the staple line of the rectum and the superior came through easily.  The colon was attached, the stapler closed and fired in the usual fashion. This produced 2 nice thick rings and no tension.  I obstruct that the proximal colon with a clamp and Dr. Rise Patience scoped the patient and the anastomosis looked intact.  We insufflated, there was no evidence of any air leak.  The rectum was then deflated.  Working in the pelvis, I did note that there was about a 3 cm cystic structure attached to the left fallopian tube and this was resected using the LigaSure and sent off as a separate specimen.  The main colon specimen was sent to the pathology to report this looked like all diverticular disease.  I spent several minutes irrigating.  I checked along the spleen again  to make sure that was dry along the entire length of the sigmoid colon, the colon this was coming down to the pelvis.  I did tack the mesentery posteriorly so the small bowel would not get out under it to cause an internal hernia.  Our anastomosis looked healthy and I used to dial projectile, we had good blood supply with a good pulse was coming to the colon from proximal colon.  We irrigated a final time.  Everything appeared to be dry.  The abdomen was then closed with a running 0 looped PDS with interrupted 0 Prolene  sutures on the fascia.  Subcu was irrigated and skin closed with staples with some Telfa wicks placed as well.  The patient tolerated procedure well.  There were no operative complications.  All counts were correct.  Estimated blood loss was 150 mL.     Haywood Lasso, M.D.     CJS/MEDQ  D:  04/30/2011  T:  04/30/2011  Job:  [id number]  cc:   Elyse Jarvis. Amedeo Plenty, M.D.  Electronically Signed by Neldon Mc M.D. on 05/06/2011 07:15:45 PM

## 2011-05-07 ENCOUNTER — Ambulatory Visit (HOSPITAL_COMMUNITY)
Admission: RE | Admit: 2011-05-07 | Discharge: 2011-05-07 | Disposition: A | Discharge status: Home / Self Care | Payer: Medicare Other | Source: Ambulatory Visit | Attending: Surgery | Admitting: Surgery

## 2011-05-07 ENCOUNTER — Other Ambulatory Visit (INDEPENDENT_AMBULATORY_CARE_PROVIDER_SITE_OTHER): Payer: Self-pay | Admitting: General Surgery

## 2011-05-07 DIAGNOSIS — M79651 Pain in right thigh: Secondary | ICD-10-CM

## 2011-05-07 DIAGNOSIS — Z9889 Other specified postprocedural states: Secondary | ICD-10-CM | POA: Insufficient documentation

## 2011-05-07 DIAGNOSIS — M79609 Pain in unspecified limb: Secondary | ICD-10-CM | POA: Insufficient documentation

## 2011-05-13 ENCOUNTER — Ambulatory Visit (INDEPENDENT_AMBULATORY_CARE_PROVIDER_SITE_OTHER): Payer: Medicare Other | Admitting: Surgery

## 2011-05-13 ENCOUNTER — Encounter (INDEPENDENT_AMBULATORY_CARE_PROVIDER_SITE_OTHER): Payer: Self-pay | Admitting: Surgery

## 2011-05-13 VITALS — BP 104/58 | HR 64 | Temp 98.4°F | Resp 16 | Ht 60.5 in | Wt 111.8 lb

## 2011-05-13 DIAGNOSIS — K56609 Unspecified intestinal obstruction, unspecified as to partial versus complete obstruction: Secondary | ICD-10-CM

## 2011-05-13 DIAGNOSIS — K56699 Other intestinal obstruction unspecified as to partial versus complete obstruction: Secondary | ICD-10-CM

## 2011-05-13 NOTE — Patient Instructions (Signed)
Call if you have any problems - I will plan to see you in about three weeks.

## 2011-05-13 NOTE — Progress Notes (Signed)
NAME: Laurie [name]                                            DOB: Nov 12, 1931 DATE: 05/13/2011                                                  MRN: [id number]  CC: Post op colectomy   HPI: This patient comes in for post op follow-up.Sheunderwent Left hemicolectomy and removal of left fallopian tube cyst  on 04/30/2011. She feels that she is doing well.  PE: General: The patient appears to be healthy, NAD Abd is soft and benign. Wound healing nicely and staples removed  DATA REVIEWED: No new data - discussed path with patient in hospital. Reminded her that we removed a small left fallopian tube cyst  IMPRESSION: The patient is doing well S/P left colectomy.    PLAN: RTC three weeks

## 2011-05-19 ENCOUNTER — Encounter (INDEPENDENT_AMBULATORY_CARE_PROVIDER_SITE_OTHER): Payer: Medicare Other | Admitting: Surgery

## 2011-06-05 ENCOUNTER — Ambulatory Visit (INDEPENDENT_AMBULATORY_CARE_PROVIDER_SITE_OTHER): Payer: Medicare Other | Admitting: Surgery

## 2011-06-05 ENCOUNTER — Encounter (INDEPENDENT_AMBULATORY_CARE_PROVIDER_SITE_OTHER): Payer: Self-pay | Admitting: Surgery

## 2011-06-05 VITALS — BP 138/66 | HR 68 | Temp 98.0°F | Resp 16 | Ht 60.0 in | Wt 113.2 lb

## 2011-06-05 DIAGNOSIS — Z9889 Other specified postprocedural states: Secondary | ICD-10-CM

## 2011-06-05 DIAGNOSIS — N23 Unspecified renal colic: Secondary | ICD-10-CM

## 2011-06-05 DIAGNOSIS — R309 Painful micturition, unspecified: Secondary | ICD-10-CM

## 2011-06-05 NOTE — Progress Notes (Signed)
NAME: Laurie [name]                                            DOB: 04/22/1932 DATE: 06/05/2011                                                  MRN: [id number]  CC: Post op colectomy   HPI: This patient comes in for post op follow-up.Sheunderwent Left hemicolectomy and removal of left fallopian tube cyst  on 04/30/2011. She feels that she is doing well.She notes some discomfort around the umbilicus and thinks she may have a bladder infection  PE: General: The patient appears to be healthy, NAD Abd is soft and benign. Wound healed with the exception of some peri-umbilical granulations  DATA REVIEWED: No new data - discussed path with patient in hospital. Reminded her that we removed a small left fallopian tube cyst  IMPRESSION: The patient is doing well S/P left colectomy. Possible UTI (had one pre-op Wound granulations    PLAN: RTC PRN Urine c&s Touched granulations with AgNO3.

## 2011-06-08 ENCOUNTER — Telehealth (INDEPENDENT_AMBULATORY_CARE_PROVIDER_SITE_OTHER): Payer: Self-pay | Admitting: General Surgery

## 2011-06-08 NOTE — Telephone Encounter (Signed)
Message copied by Margarette Asal on Mon Jun 08, 2011  8:50 AM ------      Message from: Haywood Lasso      Created: Sun Jun 07, 2011 12:19 PM       Tell her that urine culture is negative - No urinary tract infection

## 2011-06-08 NOTE — Telephone Encounter (Signed)
Left message on machine making patient aware test was negative and if she has anymore symptoms to follow up with her PCP.

## 2011-09-02 ENCOUNTER — Other Ambulatory Visit: Payer: Self-pay | Admitting: Family Medicine

## 2011-09-02 DIAGNOSIS — R921 Mammographic calcification found on diagnostic imaging of breast: Secondary | ICD-10-CM

## 2011-09-07 ENCOUNTER — Ambulatory Visit
Admission: RE | Admit: 2011-09-07 | Discharge: 2011-09-07 | Disposition: A | Discharge status: Home / Self Care | Payer: Medicare Other | Source: Ambulatory Visit | Attending: Family Medicine | Admitting: Family Medicine

## 2011-09-07 DIAGNOSIS — R921 Mammographic calcification found on diagnostic imaging of breast: Secondary | ICD-10-CM

## 2012-01-08 ENCOUNTER — Other Ambulatory Visit: Payer: Self-pay | Admitting: Family Medicine

## 2012-01-08 DIAGNOSIS — R921 Mammographic calcification found on diagnostic imaging of breast: Secondary | ICD-10-CM

## 2012-02-02 ENCOUNTER — Ambulatory Visit
Admission: RE | Admit: 2012-02-02 | Discharge: 2012-02-02 | Disposition: A | Discharge status: Home / Self Care | Payer: Medicare Other | Source: Ambulatory Visit | Attending: Family Medicine | Admitting: Family Medicine

## 2012-02-02 DIAGNOSIS — R921 Mammographic calcification found on diagnostic imaging of breast: Secondary | ICD-10-CM

## 2012-07-07 ENCOUNTER — Emergency Department (HOSPITAL_COMMUNITY): Payer: Medicare Other

## 2012-07-07 ENCOUNTER — Emergency Department (HOSPITAL_COMMUNITY)
Admission: EM | Admit: 2012-07-07 | Discharge: 2012-07-07 | Disposition: A | Discharge status: Home / Self Care | Payer: Medicare Other | Attending: Emergency Medicine | Admitting: Emergency Medicine

## 2012-07-07 ENCOUNTER — Encounter (HOSPITAL_COMMUNITY): Payer: Self-pay | Admitting: Emergency Medicine

## 2012-07-07 ENCOUNTER — Encounter (HOSPITAL_COMMUNITY)
Admission: EM | Disposition: A | Discharge status: Home / Self Care | Payer: Self-pay | Source: Home / Self Care | Attending: Emergency Medicine

## 2012-07-07 DIAGNOSIS — IMO0002 Reserved for concepts with insufficient information to code with codable children: Secondary | ICD-10-CM | POA: Insufficient documentation

## 2012-07-07 DIAGNOSIS — R1310 Dysphagia, unspecified: Secondary | ICD-10-CM | POA: Insufficient documentation

## 2012-07-07 DIAGNOSIS — T18108A Unspecified foreign body in esophagus causing other injury, initial encounter: Secondary | ICD-10-CM

## 2012-07-07 HISTORY — PX: ESOPHAGOGASTRODUODENOSCOPY: SHX5428

## 2012-07-07 LAB — CBC WITH DIFFERENTIAL/PLATELET
Basophils Absolute: 0 10*3/uL (ref 0.0–0.1)
Basophils Relative: 0 % (ref 0–1)
Lymphocytes Relative: 23 % (ref 12–46)
MCHC: 34.9 g/dL (ref 30.0–36.0)
Neutro Abs: 5.4 10*3/uL (ref 1.7–7.7)
Neutrophils Relative %: 67 % (ref 43–77)
RDW: 12.8 % (ref 11.5–15.5)
WBC: 8 10*3/uL (ref 4.0–10.5)

## 2012-07-07 LAB — BASIC METABOLIC PANEL
CO2: 24 mEq/L (ref 19–32)
Chloride: 102 mEq/L (ref 96–112)
GFR calc Af Amer: 43 mL/min — ABNORMAL LOW (ref >90)
Potassium: 4 mEq/L (ref 3.5–5.1)
Sodium: 138 mEq/L (ref 135–145)

## 2012-07-07 SURGERY — EGD (ESOPHAGOGASTRODUODENOSCOPY)
Anesthesia: Moderate Sedation

## 2012-07-07 MED ORDER — PROPOFOL 10 MG/ML IV BOLUS
0.5000 mg/kg | Freq: Once | INTRAVENOUS | Status: DC
  Filled 2012-07-07: qty 20

## 2012-07-07 MED ORDER — ONDANSETRON HCL 4 MG/2ML IJ SOLN: 4.0000 mg | Freq: Once | INTRAMUSCULAR | Status: DC

## 2012-07-07 MED ORDER — SODIUM CHLORIDE 0.9 % IV BOLUS (SEPSIS): 1000.0000 mL | Freq: Once | INTRAVENOUS | Status: DC

## 2012-07-07 MED ORDER — GLUCAGON HCL (RDNA) 1 MG IJ SOLR: 1.0000 mg | Freq: Once | INTRAMUSCULAR | Status: DC

## 2012-07-07 MED ORDER — SODIUM CHLORIDE 0.9 % IV SOLN: INTRAVENOUS | Status: DC

## 2012-07-07 MED ORDER — SUCRALFATE 1 GM/10ML PO SUSP: 1.0000 g | Freq: Three times a day (TID) | ORAL | Status: DC

## 2012-07-07 MED ORDER — OXYMETAZOLINE HCL 0.05 % NA SOLN
1.0000 | Freq: Once | NASAL | Status: AC
  Administered 2012-07-07: 1 via NASAL
  Filled 2012-07-07: qty 15

## 2012-07-07 NOTE — Consult Note (Signed)
[facility] Consultation Note  Primary Care Physician:  Gerrit Heck, MD Primary Gastroenterologist:  Dr. Teena Irani  Reason for Consultation:  Possible esophageal foreign body  HPI: Laurie [name] is a 78 y.o. female who has had odynophagia and foreign body sensation in throat since eating apple earlier this afternoon.  No prior dysphagia.  On alendronate per chart, but she denies taking this.  Saw ENT who performed negative laryngoscopy.  She endorses sialorrhea, but was per nursing able to drink a glass of water.   Past Medical History  Diagnosis Date  . Osteopenia   . Glaucoma(365)   . Family history of breast cancer     mother  . Family history of colon cancer     father    Past Surgical History  Procedure Date  . Appendectomy   . Cesarean section     x6  . Left colectomy 04/30/2011    Sr Streck  . Tubal cyst 04/30/2011    L fallopian tube cyst incidentally found  . Colectomy     Prior to Admission medications   Medication Sig Start Date End Date Taking? Authorizing Provider  aspirin 81 MG tablet Take 81 mg by mouth daily.     Yes Historical Provider, MD  Cholecalciferol (VITAMIN D) 2000 UNITS CAPS Take 1 capsule by mouth once a week.    Yes Historical Provider, MD  dorzolamide-timolol (COSOPT) 22.3-6.8 MG/ML ophthalmic solution 1 drop 2 (two) times daily.     Yes Historical Provider, MD  Travoprost, BAK Free, (TRAVATAMN) 0.004 % SOLN ophthalmic solution Place 1 drop into both eyes at bedtime.    Yes Historical Provider, MD    Current Facility-Administered Medications  Medication Dose Route Frequency Provider Last Rate Last Dose  . glucagon (GLUCAGEN) injection 1 mg  1 mg Intravenous Once Ankit Nanavati, MD      . ondansetron (ZOFRAN) injection 4 mg  4 mg Intravenous Once Ankit Nanavati, MD      . propofol (DIPRIVAN) 10 mg/mL bolus/IV push 28.6 mg  0.5 mg/kg Intravenous Once Ankit Nanavati, MD      . sodium chloride 0.9 % bolus 1,000 mL   1,000 mL Intravenous Once Varney Biles, MD       Current Outpatient Prescriptions  Medication Sig Dispense Refill  . aspirin 81 MG tablet Take 81 mg by mouth daily.        . Cholecalciferol (VITAMIN D) 2000 UNITS CAPS Take 1 capsule by mouth once a week.       . dorzolamide-timolol (COSOPT) 22.3-6.8 MG/ML ophthalmic solution 1 drop 2 (two) times daily.        . Travoprost, BAK Free, (TRAVATAMN) 0.004 % SOLN ophthalmic solution Place 1 drop into both eyes at bedtime.         Allergies as of 07/07/2012 - Review Complete 07/07/2012  Allergen Reaction Noted  . Alendronate sodium Nausea And Vomiting 03/23/2011  . Miacalcin  03/23/2011  . Neomycin  04/24/2011  . Neosporin (neomycin-polymyxin-gramicidin) Swelling 03/23/2011  . Risedronate sodium Nausea And Vomiting 03/23/2011  . Codeine Palpitations 03/23/2011    Family History  Problem Relation Age of Onset  . Colon cancer Father   . Breast cancer Mother   . Diabetes Sister     History   Social History  . Marital Status: Widowed    Spouse Name: N/A    Number of Children: N/A  . Years of Education: N/A   Occupational History  . Not on file.   Social History  Main Topics  . Smoking status: Former Smoker -- 0.5 packs/day  . Smokeless tobacco: Not on file  . Alcohol Use: No     Comment: rare  . Drug Use: No  . Sexually Active: Not on file   Other Topics Concern  . Not on file   Social History Narrative  . No narrative on file    Review of Systems: As per HPI, all others negative  Physical Exam: Vital signs in last 24 hours: Temp:  [98.1 F (36.7 C)] 98.1 F (36.7 C) (12/12 1800) Pulse Rate:  [73] 73  (12/12 1800) Resp:  [18] 18  (12/12 1800) BP: (168)/(75) 168/75 mmHg (12/12 1800) SpO2:  [99 %] 99 % (12/12 1800) Weight:  [57.153 kg (126 lb)] 57.153 kg (126 lb) (12/12 2100)   General:   Alert, cantankerous, NAD Head:  Normocephalic and atraumatic. Eyes:  Sclera clear, no icterus.   Conjunctiva pink. Ears:   Normal auditory acuity. Nose:  No deformity, discharge,  or lesions. Mouth:  No deformity or lesions.  Oropharynx pink & moist. Neck:  Supple; no masses or thyromegaly. Lungs:  Clear throughout to auscultation.   No wheezes, crackles, or rhonchi. No acute distress. Heart:  Regular rate and rhythm; no murmurs, clicks, rubs,  or gallops. Abdomen:  Soft, nontender and nondistended. No masses, hepatosplenomegaly or hernias noted. Normal bowel sounds, without guarding, and without rebound.     Pulses:  Normal pulses noted. Extremities:  Without clubbing or edema. Neurologic:  Alert and  oriented x4;  grossly normal neurologically. Skin:  Intact without significant lesions or rashes. Psych:  Alert and cooperative. Normal mood and affect.  Lab Results:  [facility] 07/07/12 2017  WBC 8.0  HGB 13.4  HCT 38.4  PLT 270   BMET  Basename 07/07/12 2017  NA 138  K 4.0  CL 102  CO2 24  GLUCOSE 101*  BUN 22  CREATININE 1.33*  CALCIUM 9.4   LFT No results found for this basename: PROT,ALBUMIN,AST,ALT,ALKPHOS,BILITOT,BILIDIR,IBILI in the last 72 hours PT/INR No results found for this basename: LABPROT:2,INR:2 in the last 72 hours  Studies/Results: Dg Chest Port 1 View  07/07/2012  *RADIOLOGY REPORT*  Clinical Data: Chest pain.  Choked on apple earlier today.  To rule out foreign body.  PORTABLE CHEST - 1 VIEW  Comparison: 04/24/2011  Findings: Shallow inspiration.  Heart size and pulmonary vascularity are normal.  Calcified and tortuous aorta.  No focal airspace consolidation.  No blunting of costophrenic angles.  No pneumothorax.  No radiopaque foreign bodies are identified although there is a bull matter may not be radiopaque.  No significant change since previous study.  IMPRESSION: No evidence of active pulmonary disease.  No radiopaque foreign bodies identified.   Original Report Authenticated By: Lucienne Capers, M.D.    Impression:  1.  Odynophagia today after eating apple.  Able to  tolerate liquids.  Is on alendronate per chart, but she denies taking this. 2.  Foreign body sensation in esophagus.  Symptoms not typical for esophageal food impaction, but this can't be definitively excluded.  Plan:  1.  Upper endoscopy (EGD) today with possible foreign body extraction. 2.  Risks (bleeding, infection, bowel perforation that could require surgery, sedation-related changes in cardiopulmonary systems), benefits (identification and possible treatment of source of symptoms, exclusion of certain causes of symptoms), and alternatives (watchful waiting, radiographic imaging studies, empiric medical treatment) of upper endoscopy (EGD) were explained to patient in detail and she wishes to proceed.   LOS:  0 days   Thailan Sava M  07/07/2012, 10:37 PM

## 2012-07-07 NOTE — Op Note (Signed)
Date:  07/07/12 Physician:  Arta Silence, MD Procedure performed:  Esophagogastroduodenoscopy (EGD) with esophageal foreign body removal. Indication:  Suspected esophageal foreign body Medications administered:  Cetacaine spray x 2  FINDINGS:  1-cm by 1-cm husk of apple seen in her proximal esophagus, just distal to the GE junction.  This was gently nudged into the stomach with the pediatric (XP) endoscope.  There was lots of edema and ulceration in the proximal esophagus in the region of the impacted food.  Patient has moderate hiatal hernia but no obvious underlying stricture and no obvious mucosal features of eosinophilic esophagitis.  Limited views of stomach, pylorus and duodenum to the second portion was normal.  IMPRESSION:  As above.  Proximal esophageal foreign body (apple husk), gently nudged into the stomach as below.  Underlying ulceration, edema and friability of proximal esophagus in region of foreign body.  RECOMMENDATIONS: 1.  Watch for potential complications of procedure. 2.  Pureed and finely chopped food only until further notice. 3.  Carafate suspension 1 gram by mouth three times a day. 4.  Follow-up with Dr. Amedeo Plenty in 2-3 weeks.  Patient would likely benefit from repeat endoscopy in the next several weeks. 5.  Case discussed in detail with the ED team.

## 2012-07-07 NOTE — ED Provider Notes (Addendum)
History     CSN: [id number]  Arrival date & time 07/07/12  1745   First MD Initiated Contact with Patient 07/07/12 1848      Chief Complaint  Patient presents with  . Foreign Body    (Consider location/radiation/quality/duration/timing/severity/associated sxs/prior treatment) HPI Comments: Pt comes in with cc of foreign body impaction to the neck. She was eating apple, and felt like something got stuck. Since then she has been having a foreign body sensation in her lower throat, with painful swallowing. No n/v. No chest pain, sob. No hx of similar complain in the past. Pt was seen at urgent care, sent to the ED and ENT was consulted.   Patient is a 78 y.o. female presenting with foreign body. The history is provided by the patient.  Foreign Body  Pertinent negatives include no chest pain, no abdominal pain, no vomiting, no drooling and no sore throat.    Past Medical History  Diagnosis Date  . Osteopenia   . Glaucoma(365)   . Family history of breast cancer     mother  . Family history of colon cancer     father    Past Surgical History  Procedure Date  . Appendectomy   . Cesarean section     x6  . Left colectomy 04/30/2011    Sr Streck  . Tubal cyst 04/30/2011    L fallopian tube cyst incidentally found  . Colectomy     Family History  Problem Relation Age of Onset  . Colon cancer Father   . Breast cancer Mother   . Diabetes Sister     History  Substance Use Topics  . Smoking status: Former Smoker -- 0.5 packs/day  . Smokeless tobacco: Not on file  . Alcohol Use: No     Comment: rare    OB History    Grav Para Term Preterm Abortions TAB SAB Ect Mult Living                  Review of Systems  Constitutional: Positive for activity change.  HENT: Negative for sore throat, drooling and neck pain.   Respiratory: Negative for shortness of breath, wheezing and stridor.   Cardiovascular: Negative for chest pain.  Gastrointestinal: Negative for  nausea, vomiting and abdominal pain.  Genitourinary: Negative for dysuria.  Neurological: Negative for headaches.    Allergies  Alendronate sodium; Miacalcin; Neomycin; Neosporin; Risedronate sodium; and Codeine  Home Medications   Current Outpatient Rx  Name  Route  Sig  Dispense  Refill  . ASPIRIN 81 MG PO TABS   Oral   Take 81 mg by mouth daily.           Marland Kitchen VITAMIN D 2000 UNITS PO CAPS   Oral   Take 1 capsule by mouth once a week.          . DORZOLAMIDE HCL-TIMOLOL MAL 22.3-6.8 MG/ML OP SOLN      1 drop 2 (two) times daily.           . TRAVOPROST (BAK FREE) 0.004 % OP SOLN   Both Eyes   Place 1 drop into both eyes at bedtime.            BP 168/75  Pulse 73  Temp 98.1 F (36.7 C) (Oral)  Resp 18  SpO2 99%  Physical Exam  Nursing note and vitals reviewed. Constitutional: She is oriented to person, place, and time. She appears well-developed and well-nourished.  HENT:  Head: Normocephalic and  atraumatic.  Eyes: EOM are normal. Pupils are equal, round, and reactive to light.  Neck: Neck supple.  Cardiovascular: Normal rate, regular rhythm and normal heart sounds.   No murmur heard. Pulmonary/Chest: Effort normal. No respiratory distress.  Abdominal: Soft. She exhibits no distension. There is no tenderness. There is no rebound and no guarding.  Neurological: She is alert and oriented to person, place, and time.  Skin: Skin is warm and dry.    ED Course  Procedures (including critical care time)   Labs Reviewed  CBC WITH DIFFERENTIAL  BASIC METABOLIC PANEL   No results found.   No diagnosis found.    MDM  Pt comes in with appears like food impaction. Urgent care already seen, and appears that ENt has been paged from triage. Resp status is WNL at this time. Will try glucagon in the meantime.  Varney Biles, MD 07/07/12 2022  11:07 PM ENT scope was negative, but patient has persistent pain wit swallowing - so GI consulted. Pt got a  scope per GI - and there was a small foreign body that was pushed down. Will discharge now.  Varney Biles, MD 07/07/12 [phone]

## 2012-07-07 NOTE — H&P (Signed)
Patient interval history reviewed.  Patient examined again.  There has been no change from documented H/P and my consult dated 07/07/12 except as documented below.  Assessment:  1.  Possible esophageal foreign body. 2.  Odynophagia.  Plan:  1.  Upper endoscopy with possible esophageal foreign body extraction. 2.  Risks (bleeding, infection, bowel perforation that could require surgery, sedation-related changes in cardiopulmonary systems), benefits (identification and possible treatment of source of symptoms, exclusion of certain causes of symptoms), and alternatives (watchful waiting, radiographic imaging studies, empiric medical treatment) of upper endoscopy (EGD) were explained to patient in detail and patient wishes to proceed.

## 2012-07-07 NOTE — ED Notes (Signed)
Paged Dr. Benson Norway to [phone]

## 2012-07-07 NOTE — Consult Note (Signed)
Laurelin, Luca [id number] 12-May-1932 Varney Biles, MD  Reason for Consult: dysphagia, possible foreign body  HPI: patient was eating an apple today and thinks she choked on a piece of the apple. Went to an urgent care, has been able to swallow liquids but complains of odynophagia and dysphagia. Sent to ER for barium swallow and ENT eval.  Allergies:  Allergies  Allergen Reactions  . Alendronate Sodium Nausea And Vomiting  . Miacalcin     Unknown  . Neomycin     Unknown  . Neosporin (Neomycin-Polymyxin-Gramicidin) Swelling  . Risedronate Sodium Nausea And Vomiting  . Codeine Palpitations    ROS: + for odynophagia, dysphagia, sore throat, otherwise negative x 10 systems except per HPI.  PMH:  Past Medical History  Diagnosis Date  . Osteopenia   . Glaucoma(365)   . Family history of breast cancer     mother  . Family history of colon cancer     father    FH:  Family History  Problem Relation Age of Onset  . Colon cancer Father   . Breast cancer Mother   . Diabetes Sister     SH:  History   Social History  . Marital Status: Widowed    Spouse Name: N/A    Number of Children: N/A  . Years of Education: N/A   Occupational History  . Not on file.   Social History Main Topics  . Smoking status: Former Smoker -- 0.5 packs/day  . Smokeless tobacco: Not on file  . Alcohol Use: No     Comment: rare  . Drug Use: No  . Sexually Active: Not on file   Other Topics Concern  . Not on file   Social History Narrative  . No narrative on file    PSH:  Past Surgical History  Procedure Date  . Appendectomy   . Cesarean section     x6  . Left colectomy 04/30/2011    Sr Streck  . Tubal cyst 04/30/2011    L fallopian tube cyst incidentally found  . Colectomy     Physical  Exam: CN 2-12 grossly intact and symmetric. Normal voice, no hoarseness or stridor. Abel to swallow secretions without difficulty. EAC/TMs normal BL. Oral cavity, lips, gums, ororpharynx  normal with no masses or lesions. Skin warm and dry. Nasal cavity without polyps or purulence. External nose and ears without masses or lesions. EOMI, PERRLA. Neck supple with no masses or lesions. No lymphadenopathy palpated.  Procedure Note: 31575 Informed verbal consent was obtained after explaining the risks (including bleeding and infection), benefits and alternatives of the procedure. Verbal timeout was performed prior to the procedure. The 62mm flexible scope was advanced through the  nasal cavity. The septum and turbinates appeared normal on the right but on the left the septum is significantly deviated to the left with nasal obstruction. The middle meatus was free of polyps or purulence. The eustachian tube, choana, and adenoids were normal in appearance. The hypopharynx, arytenoids, false vocal folds, and true vocal folds appeared normal and the true vocal folds adduct and abduct normally. The visualized portion of the subglottis appeared normal. The esophageal inlet appeared normal. No laryngeal, hypopharyngeal, or vallecular masses or foreign bodies were seen. The patient tolerated the procedure with no immediate complications.  A/P: foreign body sensation/dysphagia with no foreign bodies seen on flexible laryngoscopy. I recommended a Barium swallow. If the Barium swallow is grossly normal she can resume a normal diet or soft/liquid diet and advance to  regular diet as tolerated.   Ruby Cola 07/07/2012 7:50 PM

## 2012-07-07 NOTE — ED Notes (Signed)
Pt st's she was eating a apple earlier today and got choked pt st's she feels like piece of apple is still lodged in her throat.  St's she was able to drink a bottle of water and keep it down.  Pt was sent to ED from Urgent Care.

## 2012-07-07 NOTE — ED Notes (Signed)
Procedure explained by Dr. Paulita Fujita and consent signed for EGD.  No sedation needed.

## 2012-07-07 NOTE — ED Notes (Signed)
Pt sent here by [facility] to have eval for possible piece of apple foreign body in throat; pt sts pain with swallowing and handling secretions at present; [facility] paged

## 2012-07-07 NOTE — ED Notes (Signed)
ENT returned call and sts for pt to be seen by EDP and have barium swallow and consult to ENT if needed

## 2012-07-07 NOTE — ED Notes (Signed)
Paged Dr. Benson Norway to [phone]

## 2012-07-11 ENCOUNTER — Encounter (HOSPITAL_COMMUNITY): Payer: Self-pay | Admitting: Gastroenterology

## 2013-02-16 ENCOUNTER — Other Ambulatory Visit: Payer: Self-pay

## 2013-02-16 DIAGNOSIS — Z1231 Encounter for screening mammogram for malignant neoplasm of breast: Secondary | ICD-10-CM

## 2013-03-01 ENCOUNTER — Ambulatory Visit
Admission: RE | Admit: 2013-03-01 | Discharge: 2013-03-01 | Disposition: A | Discharge status: Home / Self Care | Payer: Medicare Other | Source: Ambulatory Visit

## 2013-03-01 DIAGNOSIS — Z1231 Encounter for screening mammogram for malignant neoplasm of breast: Secondary | ICD-10-CM

## 2013-07-09 ENCOUNTER — Encounter (HOSPITAL_COMMUNITY): Payer: Self-pay | Admitting: Emergency Medicine

## 2013-07-09 ENCOUNTER — Emergency Department (HOSPITAL_COMMUNITY)
Admission: EM | Admit: 2013-07-09 | Discharge: 2013-07-09 | Disposition: A | Discharge status: Home / Self Care | Payer: Medicare Other | Attending: Emergency Medicine | Admitting: Emergency Medicine

## 2013-07-09 ENCOUNTER — Telehealth (HOSPITAL_COMMUNITY): Payer: Self-pay

## 2013-07-09 DIAGNOSIS — Z8739 Personal history of other diseases of the musculoskeletal system and connective tissue: Secondary | ICD-10-CM | POA: Insufficient documentation

## 2013-07-09 DIAGNOSIS — Z87891 Personal history of nicotine dependence: Secondary | ICD-10-CM | POA: Insufficient documentation

## 2013-07-09 DIAGNOSIS — H109 Unspecified conjunctivitis: Secondary | ICD-10-CM

## 2013-07-09 DIAGNOSIS — Z79899 Other long term (current) drug therapy: Secondary | ICD-10-CM | POA: Insufficient documentation

## 2013-07-09 MED ORDER — BACITRACIN 500 UNIT/GM OP OINT: 1.0000 "application " | TOPICAL_OINTMENT | Freq: Two times a day (BID) | OPHTHALMIC | Status: DC

## 2013-07-09 MED ORDER — BACITRACIN 500 UNIT/GM EX OINT: 1.0000 "application " | TOPICAL_OINTMENT | Freq: Four times a day (QID) | CUTANEOUS | Status: DC

## 2013-07-09 MED ORDER — FLUORESCEIN SODIUM 1 MG OP STRP
1.0000 | ORAL_STRIP | Freq: Once | OPHTHALMIC | Status: AC
  Administered 2013-07-09: 1 via OPHTHALMIC
  Filled 2013-07-09: qty 1

## 2013-07-09 MED ORDER — NON FORMULARY: 500.0000 [IU] | Freq: Two times a day (BID) | Status: DC

## 2013-07-09 MED ORDER — TETRACAINE HCL 0.5 % OP SOLN
2.0000 [drp] | Freq: Once | OPHTHALMIC | Status: AC
  Administered 2013-07-09: 2 [drp] via OPHTHALMIC
  Filled 2013-07-09: qty 2

## 2013-07-09 NOTE — Discharge Instructions (Signed)
Conjunctivitis Conjunctivitis is commonly called "pink eye." Conjunctivitis can be caused by bacterial or viral infection, allergies, or injuries. There is usually redness of the lining of the eye, itching, discomfort, and sometimes discharge. There may be deposits of matter along the eyelids. A viral infection usually causes a watery discharge, while a bacterial infection causes a yellowish, thick discharge. Pink eye is very contagious and spreads by direct contact. You may be given antibiotic eyedrops as part of your treatment. Before using your eye medicine, remove all drainage from the eye by washing gently with warm water and cotton balls. Continue to use the medication until you have awakened 2 mornings in a row without discharge from the eye. Do not rub your eye. This increases the irritation and helps spread infection. Use separate towels from other household members. Wash your hands with soap and water before and after touching your eyes. Use cold compresses to reduce pain and sunglasses to relieve irritation from light. Do not wear contact lenses or wear eye makeup until the infection is gone. SEEK MEDICAL CARE IF:   Your symptoms are not better after 3 days of treatment.  You have increased pain or trouble seeing.  The outer eyelids become very red or swollen. Document Released: 08/20/2004 Document Revised: 10/05/2011 Document Reviewed: 07/13/2005 Exit[employer] Patient Information 2014 [employer].  

## 2013-07-09 NOTE — ED Notes (Signed)
Pharmacy calling for clarification of Rx written by Dr Dorna Mai.   Call transferred to Dr Dorna Mai for clarification.

## 2013-07-09 NOTE — ED Notes (Addendum)
Pt reports having hx of glacuoma in both eyes and states she woke up this morning around 5am with right eye pain with clear watery discharge.

## 2013-07-09 NOTE — ED Provider Notes (Signed)
CSN: [id number]     Arrival date & time 07/09/13  0831 History   First MD Initiated Contact with Patient 07/09/13 [phone]     Chief Complaint  Patient presents with  . Eye Pain   (Consider location/radiation/quality/duration/timing/severity/associated sxs/prior Treatment) HPI Comments: Pt has a h/o glaucoma in both eyes and wears glasses, not contacts.  Does not think she injured it or got anything in it.  She has somewhat of a FB sensation in right eye, pain began with waking around 0500 this AM.  Doesn't have allergies.  She did use usually AM eye drops for glaucoma. Also takes 2 drops at night and a pill for glaucoma.  Her ophthalmologist is Dr. Posey Pronto.  No HA, no neck pain, no N/V.  No change in vision by her report.  Eye is watery with clear tears  Patient is a 79 y.o. female presenting with eye pain. The history is provided by the patient.  Eye Pain This is a new problem. The current episode started 1 to 2 hours ago. The problem occurs constantly. The problem has not changed since onset.Pertinent negatives include no chest pain, no abdominal pain and no headaches. Nothing aggravates the symptoms. Nothing relieves the symptoms. She has tried nothing for the symptoms.    Past Medical History  Diagnosis Date  . Osteopenia   . Glaucoma   . Family history of breast cancer     mother  . Family history of colon cancer     father   Past Surgical History  Procedure Laterality Date  . Appendectomy    . Cesarean section      x6  . Left colectomy  04/30/2011    Sr Streck  . Tubal cyst  04/30/2011    L fallopian tube cyst incidentally found  . Colectomy    . Esophagogastroduodenoscopy  07/07/2012    Procedure: ESOPHAGOGASTRODUODENOSCOPY (EGD);  Surgeon: Arta Silence, MD;  [facility] END[facility]COPY;  Service: Endoscopy;  Laterality: N/A;   Family History  Problem Relation Age of Onset  . Colon cancer Father   . Breast cancer Mother   . Diabetes Sister    History  Substance Use Topics   . Smoking status: Former Smoker -- 0.50 packs/day  . Smokeless tobacco: Not on file  . Alcohol Use: No     Comment: rare   OB History   Grav Para Term Preterm Abortions TAB SAB Ect Mult Living                 Review of Systems  Constitutional: Negative for fever and chills.  Eyes: Positive for pain, discharge, redness and itching. Negative for photophobia and visual disturbance.  Cardiovascular: Negative for chest pain.  Gastrointestinal: Negative for nausea, vomiting and abdominal pain.  Musculoskeletal: Negative for neck pain.  Neurological: Negative for headaches.    Allergies  Alendronate sodium; Miacalcin; Neomycin; Neosporin; Risedronate sodium; and Codeine  Home Medications   Current Outpatient Rx  Name  Route  Sig  Dispense  Refill  . acetaminophen (TYLENOL) 500 MG tablet   Oral   Take 1,000 mg by mouth every 6 (six) hours as needed.         . Cholecalciferol (VITAMIN D) 2000 UNITS CAPS   Oral   Take 1 capsule by mouth once a week.          . dorzolamide-timolol (COSOPT) 22.3-6.8 MG/ML ophthalmic solution      1 drop 2 (two) times daily.           Marland Kitchen  Travoprost, BAK Free, (TRAVATAMN) 0.004 % SOLN ophthalmic solution   Both Eyes   Place 1 drop into both eyes at bedtime.          . bacitracin 500 UNIT/GM ointment   Topical   Apply 1 application topically 4 (four) times daily.   15 g   0    BP 157/74  Pulse 60  Temp(Src) 98 F (36.7 C) (Oral)  Resp 16  SpO2 96% Physical Exam  Nursing note and vitals reviewed. Constitutional: She is oriented to person, place, and time. She appears well-developed and well-nourished. No distress.  Eyes: Lids are normal. Pupils are equal, round, and reactive to light. Right eye exhibits chemosis. Right conjunctiva is injected. Right conjunctiva has no hemorrhage. Left conjunctiva has no hemorrhage. Right eye exhibits normal extraocular motion. Left eye exhibits normal extraocular motion.  Fundoscopic exam:       The right eye shows no exudate.  Slit lamp exam:      The right eye shows no corneal abrasion, no corneal ulcer, no foreign body, no hyphema and no fluorescein uptake.       The left eye shows no corneal abrasion, no corneal ulcer, no foreign body and no fluorescein uptake.  tonopen pressure in OS is 19, in OD (affected eye) is 24  Neurological: She is alert and oriented to person, place, and time. She exhibits normal muscle tone. Coordination and gait normal.  Skin: Skin is warm and dry. No rash noted. She is not diaphoretic.  Psychiatric: She has a normal mood and affect.    ED Course  Procedures (including critical care time) Labs Review Labs Reviewed - No data to display Imaging Review No results found.  EKG Interpretation   None      RA sat is 94% and I interpret to be adequate  10:56 AM Spoke to Dr. Bing Plume who will see pt in follow up tomorrow.  She would prefer to be seen at 3 PM.  Will give bacitracin ointment for symptom relief.   MDM   1. Conjunctivitis      Pt with right eye irritation, eye pressure is slightly elevated, most likely consistent with h/o glaucoma and irritation.  No obvious abrasion, certainly no ulceration.  Pt is not diabetic.  Will discuss with Dr. Posey Pronto.  Pt is on Travatamn and Cosopt already.        Saddie Benders. Raegen Tarpley, MD 07/09/13 1103

## 2013-07-23 ENCOUNTER — Emergency Department (HOSPITAL_COMMUNITY)
Admission: EM | Admit: 2013-07-23 | Discharge: 2013-07-23 | Discharge status: Against Medical Advice | Payer: Medicare Other | Attending: Emergency Medicine | Admitting: Emergency Medicine

## 2013-07-23 ENCOUNTER — Encounter (HOSPITAL_COMMUNITY): Payer: Self-pay | Admitting: Emergency Medicine

## 2013-07-23 DIAGNOSIS — R05 Cough: Secondary | ICD-10-CM | POA: Insufficient documentation

## 2013-07-23 DIAGNOSIS — R059 Cough, unspecified: Secondary | ICD-10-CM | POA: Insufficient documentation

## 2013-07-23 DIAGNOSIS — J3489 Other specified disorders of nose and nasal sinuses: Secondary | ICD-10-CM | POA: Insufficient documentation

## 2013-07-23 NOTE — ED Notes (Signed)
Per pt/family, cold symptoms, N/V/D since the 25th-now has some nasal congestion, and cough, nonproductive

## 2013-11-08 ENCOUNTER — Ambulatory Visit: Payer: Medicare Other | Attending: Internal Medicine | Admitting: Physical Therapy

## 2013-11-08 DIAGNOSIS — R5381 Other malaise: Secondary | ICD-10-CM | POA: Insufficient documentation

## 2013-11-08 DIAGNOSIS — IMO0001 Reserved for inherently not codable concepts without codable children: Secondary | ICD-10-CM | POA: Insufficient documentation

## 2013-11-08 DIAGNOSIS — M810 Age-related osteoporosis without current pathological fracture: Secondary | ICD-10-CM | POA: Insufficient documentation

## 2013-11-15 ENCOUNTER — Ambulatory Visit: Payer: Medicare Other | Admitting: Physical Therapy

## 2013-11-22 ENCOUNTER — Encounter: Payer: Medicare Other | Admitting: Physical Therapy

## 2013-11-28 ENCOUNTER — Ambulatory Visit: Payer: Medicare Other | Attending: Internal Medicine | Admitting: Physical Therapy

## 2013-11-28 DIAGNOSIS — IMO0001 Reserved for inherently not codable concepts without codable children: Secondary | ICD-10-CM | POA: Diagnosis not present

## 2013-11-28 DIAGNOSIS — R5381 Other malaise: Secondary | ICD-10-CM | POA: Diagnosis not present

## 2013-11-28 DIAGNOSIS — M810 Age-related osteoporosis without current pathological fracture: Secondary | ICD-10-CM | POA: Insufficient documentation

## 2013-11-29 ENCOUNTER — Encounter: Payer: Medicare Other | Admitting: Physical Therapy

## 2013-12-05 ENCOUNTER — Ambulatory Visit: Payer: Medicare Other | Admitting: Physical Therapy

## 2013-12-05 DIAGNOSIS — IMO0001 Reserved for inherently not codable concepts without codable children: Secondary | ICD-10-CM | POA: Diagnosis not present

## 2014-02-08 ENCOUNTER — Other Ambulatory Visit: Payer: Self-pay

## 2014-02-08 DIAGNOSIS — Z1231 Encounter for screening mammogram for malignant neoplasm of breast: Secondary | ICD-10-CM

## 2014-03-02 ENCOUNTER — Ambulatory Visit: Payer: Medicare Other

## 2014-03-02 ENCOUNTER — Ambulatory Visit
Admission: RE | Admit: 2014-03-02 | Discharge: 2014-03-02 | Disposition: A | Discharge status: Home / Self Care | Payer: Medicare Other | Source: Ambulatory Visit

## 2014-03-02 DIAGNOSIS — Z1231 Encounter for screening mammogram for malignant neoplasm of breast: Secondary | ICD-10-CM

## 2015-02-11 ENCOUNTER — Other Ambulatory Visit: Payer: Self-pay

## 2015-02-11 DIAGNOSIS — Z1231 Encounter for screening mammogram for malignant neoplasm of breast: Secondary | ICD-10-CM

## 2015-03-05 ENCOUNTER — Ambulatory Visit: Payer: Self-pay

## 2015-03-12 ENCOUNTER — Ambulatory Visit
Admission: RE | Admit: 2015-03-12 | Discharge: 2015-03-12 | Disposition: A | Discharge status: Home / Self Care | Payer: Medicare Other | Source: Ambulatory Visit

## 2015-03-12 DIAGNOSIS — Z1231 Encounter for screening mammogram for malignant neoplasm of breast: Secondary | ICD-10-CM

## 2016-02-20 ENCOUNTER — Other Ambulatory Visit: Payer: Self-pay | Admitting: Family Medicine

## 2016-02-20 DIAGNOSIS — Z1231 Encounter for screening mammogram for malignant neoplasm of breast: Secondary | ICD-10-CM

## 2016-03-13 ENCOUNTER — Ambulatory Visit
Admission: RE | Admit: 2016-03-13 | Discharge: 2016-03-13 | Disposition: A | Discharge status: Home / Self Care | Payer: Medicare Other | Source: Ambulatory Visit | Attending: Family Medicine | Admitting: Family Medicine

## 2016-03-13 DIAGNOSIS — Z1231 Encounter for screening mammogram for malignant neoplasm of breast: Secondary | ICD-10-CM

## 2017-02-11 ENCOUNTER — Other Ambulatory Visit: Payer: Self-pay | Admitting: Family Medicine

## 2017-02-11 DIAGNOSIS — Z1231 Encounter for screening mammogram for malignant neoplasm of breast: Secondary | ICD-10-CM

## 2017-03-16 ENCOUNTER — Ambulatory Visit
Admission: RE | Admit: 2017-03-16 | Discharge: 2017-03-16 | Disposition: A | Discharge status: Home / Self Care | Payer: Medicare Other | Source: Ambulatory Visit | Attending: Family Medicine | Admitting: Family Medicine

## 2017-03-16 DIAGNOSIS — Z1231 Encounter for screening mammogram for malignant neoplasm of breast: Secondary | ICD-10-CM

## 2017-11-29 ENCOUNTER — Ambulatory Visit: Payer: Medicare Other | Attending: Internal Medicine | Admitting: Physical Therapy

## 2017-11-29 ENCOUNTER — Other Ambulatory Visit: Payer: Self-pay

## 2017-11-29 ENCOUNTER — Encounter: Payer: Self-pay | Admitting: Physical Therapy

## 2017-11-29 DIAGNOSIS — M810 Age-related osteoporosis without current pathological fracture: Secondary | ICD-10-CM | POA: Diagnosis present

## 2017-11-29 DIAGNOSIS — R2689 Other abnormalities of gait and mobility: Secondary | ICD-10-CM | POA: Diagnosis present

## 2017-11-29 DIAGNOSIS — M6281 Muscle weakness (generalized): Secondary | ICD-10-CM | POA: Insufficient documentation

## 2017-11-29 NOTE — Patient Instructions (Signed)
Access Code: [email or web address]  URL: [email or web address]  Date: 11/29/2017  Prepared by: Earlie Counts   Exercises  Standing Single Leg Stance with Counter Support - 5 reps - 10 hold - 1x daily - 7x weekly  Tandem Stance with Support - 5 reps - 1 sets - 30 hold - 1x daily - 7x weekly  Patient Education  How to [facility] [address], [place], [place] [address] Phone # [phone] Fax [phone]

## 2017-11-29 NOTE — Therapy (Signed)
[facility] [address] [place], [place], [address] Phone: [phone]   Fax:  [phone]  Physical Therapy Evaluation  Patient Details  Name: Laurie [name] MRN: [id number] Date of Birth: 13-Oct-1931 Referring Provider: Dr. Orion Crook   Encounter Date: 11/29/2017  PT End of Session - 11/29/17 1618    Visit Number  1    Date for PT Re-Evaluation  12/27/17    Authorization Type  UHC    PT Start Time  6712    PT Stop Time  1525    PT Time Calculation (min)  40 min    Activity Tolerance  Patient tolerated treatment well    Behavior During Therapy  [facility] for tasks assessed/performed       Past Medical History:  Diagnosis Date  . Family history of breast cancer    mother  . Family history of colon cancer    father  . Glaucoma   . Osteopenia     Past Surgical History:  Procedure Laterality Date  . APPENDECTOMY    . CESAREAN SECTION     x6  . COLECTOMY    . ESOPHAGOGASTRODUODENOSCOPY  07/07/2012   Procedure: ESOPHAGOGASTRODUODENOSCOPY (EGD);  Surgeon: Arta Silence, MD;  Location: [facility] ENDOSCOPY;  Service: Endoscopy;  Laterality: N/A;  . LEFT COLECTOMY  04/30/2011   Sr Streck  . tubal cyst  04/30/2011   L fallopian tube cyst incidentally found    There were no vitals filed for this visit.   Subjective Assessment - 11/29/17 1454    Subjective  Patient has a t-score of -2.8.  I was here before for osteoporosis. Exercise is walking around the house. Takes a nap in the afternoon.  I have not been doing the exercises since last time.     Patient Stated Goals  education on osteoporoses    Currently in Pain?  No/denies    Multiple Pain Sites  No         [facility] PT Assessment - 11/29/17 0001      Assessment   Medical Diagnosis  M81.0 age-related ostoeporosis without current pathological fracture    Referring Provider  Dr. Orion Crook    Onset Date/Surgical Date  11/29/16    Prior Therapy   yes      Precautions   Precautions  Other (comment)    Precaution Comments  osteoporosis      Restrictions   Weight Bearing Restrictions  No      Balance Screen   Has the patient fallen in the past 6 months  No    Has the patient had a decrease in activity level because of a fear of falling?   No    Is the patient reluctant to leave their home because of a fear of falling?   No      Home Environment   Living Environment  Private residence    Living Arrangements  Alone    Type of [place] to enter    Entrance Stairs-Number of Steps  1    Home Layout  Two level lives on first level      Prior Function   Level of Independence  Independent      Cognition   Overall Cognitive Status  Within Functional Limits for tasks assessed      Observation/Other Assessments   Focus on Therapeutic Outcomes (FOTO)   25% limitation due to Berg balance score  Posture/Postural Control   Posture Comments  57.3/4 inches      ROM / Strength   AROM / PROM / Strength  AROM;PROM;Strength      AROM   Lumbar Extension  decreased by 75%    Lumbar - Right Side Bend  decreased by 50%    Lumbar - Left Side Bend  decreased by 50%      Strength   Right Hip Flexion  4/5    Right Hip Extension  3/5    Right Hip ABduction  4/5    Left Hip Flexion  4/5    Left Hip Extension  3/5    Left Hip ABduction  4/5    Right/Left Knee  Right;Left    Right Knee Flexion  4/5    Right Knee Extension  5/5    Left Knee Flexion  4/5    Left Knee Extension  5/5      Ambulation/Gait   Ambulation/Gait  No      Standardized Balance Assessment   Standardized Balance Assessment  Five Times Sit to Stand;Timed Up and Go Test;Berg Balance Test    Five times sit to stand comments   15 sec       Berg Balance Test   Sit to Stand  Able to stand without using hands and stabilize independently    Standing Unsupported  Able to stand safely 2 minutes    Sitting with Back Unsupported but Feet  Supported on Floor or Stool  Able to sit safely and securely 2 minutes    Stand to Sit  Sits safely with minimal use of hands    Transfers  Able to transfer safely, minor use of hands    Standing Unsupported with Eyes Closed  Able to stand 10 seconds safely    Standing Ubsupported with Feet Together  Able to place feet together independently and stand 1 minute safely    From Standing, Reach Forward with Outstretched Arm  Can reach confidently >25 cm (10")    From Standing Position, Pick up Object from Floor  Able to pick up shoe safely and easily    From Standing Position, Turn to Look Behind Over each Shoulder  Looks behind from both sides and weight shifts well    Turn 360 Degrees  Able to turn 360 degrees safely in 4 seconds or less    Standing Unsupported, Alternately Place Feet on Step/Stool  Able to stand independently and safely and complete 8 steps in 20 seconds    Standing Unsupported, One Foot in Front  Needs help to step but can hold 15 seconds    Standing on One Leg  Tries to lift leg/unable to hold 3 seconds but remains standing independently    Total Score  50    Berg comment:  25% chance of falling      Timed Up and Go Test   TUG  Normal TUG    Normal TUG (seconds)  12                Objective measurements completed on examination: See above findings.              PT Education - 11/29/17 1721    Education provided  Yes    Education Details  Access Code: [email or web address]     Person(s) Educated  Patient    Methods  Explanation;Demonstration;Verbal cues;Handout    Comprehension  Returned demonstration;Verbalized understanding          PT Long  Term Goals - 11/29/17 1733      PT LONG TERM GOAL #1   Title  understand how to exercise at home to improve strength and balance safely    Time  4    Period  Weeks    Status  New    Target Date  12/27/17      PT LONG TERM GOAL #2   Title  understand correct posture with daily tasks to reduce strain on the  spine to reduce risk of fractures    Time  4    Period  Weeks    Status  New    Target Date  12/27/17      PT LONG TERM GOAL #3   Title  understand how to manage osteoporosis with correct posture and weightbearing activities    Time  4    Period  Weeks    Status  New    Target Date  12/27/17      PT LONG TERM GOAL #4   Title  Berg balance score >/= 52/56 due to improved balance    Time  4    Period  Weeks    Status  New    Target Date  12/27/17             Plan - 11/29/17 1724    Clinical Impression Statement  Patient is a 84 year old female with diagnosis of osteoporosis with t-score -2.8.  Patient reports no fracture. Patient reports no pain. Patient height is 57.75 inches using the [place] system.  Patient posture include forward head, increased cervical thoracic kyphosis, and flat lumbar spine.  TUG score is 12 seconds.  Sit to stand is 15 seconds with no hands.  Lumbar extension is decreased by 75% and bilateral sidebending is decreased by 50%. Bilateral hip strength is 4/5 with exception of extension and is 3/5. Bilatereal knee flexion is 4/5.  Berg Balance is 50/56 indicating 25% chance of falls.  Patient has difficulty with single leg stance and tandem stance.  Patient will benefit from osteoporosis program to improve balance, understand about osteoporosis and to be placed on an HEP to work on strength and bone strength.     History and Personal Factors relevant to plan of care:  osteoporosis; [place]    Clinical Presentation  Stable    Clinical Presentation due to:  stable condition    Clinical Decision Making  Low    Rehab Potential  Excellent    Clinical Impairments Affecting Rehab Potential  osteoporosis; [place]    PT Frequency  2x / week    PT Duration  4 weeks    PT Treatment/Interventions  Neuromuscular re-education;Therapeutic activities;Therapeutic exercise;Manual techniques;Energy conservation;Passive range of motion    PT Next Visit Plan  tips to avoid falls;  information about osteoporosis; balance exercises; hip and knee strength; stretches for legs and back    PT Home Exercise Plan  tips to avoid falls; information on osteoporosis    Consulted and Agree with Plan of Care  Patient       Patient will benefit from skilled therapeutic intervention in order to improve the following deficits and impairments:  Increased fascial restricitons, Decreased mobility, Decreased balance, Decreased strength  Visit Diagnosis: Muscle weakness (generalized)  Other abnormalities of gait and mobility  Osteoporosis without current pathological fracture, unspecified osteoporosis type     Problem List Patient Active Problem List   Diagnosis Date Noted  . 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, [place], [place], [address] Phone: [phone]   Fax:  [phone]  Name: Laurie [name] MRN: [id number] Date of Birth: 01-Nov-1931

## 2017-12-02 ENCOUNTER — Encounter: Payer: Medicare Other | Admitting: Physical Therapy

## 2017-12-07 ENCOUNTER — Encounter: Payer: Self-pay | Admitting: Physical Therapy

## 2017-12-07 ENCOUNTER — Ambulatory Visit: Payer: Medicare Other | Admitting: Physical Therapy

## 2017-12-07 DIAGNOSIS — M6281 Muscle weakness (generalized): Secondary | ICD-10-CM | POA: Diagnosis not present

## 2017-12-07 DIAGNOSIS — R2689 Other abnormalities of gait and mobility: Secondary | ICD-10-CM

## 2017-12-07 DIAGNOSIS — M810 Age-related osteoporosis without current pathological fracture: Secondary | ICD-10-CM

## 2017-12-07 NOTE — Therapy (Signed)
[facility] [address] [place], [place], [address] Phone: [phone]   Fax:  [phone]  Physical Therapy Treatment  Patient Details  Name: Laurie [name] MRN: [id number] Date of Birth: February 07, 1932 Referring Provider: Dr. Orion Crook   Encounter Date: 12/07/2017  PT End of Session - 12/07/17 1113    Visit Number  2    Date for PT Re-Evaluation  12/27/17    Authorization Type  UHC    PT Start Time  1110 late    PT Stop Time  1143    PT Time Calculation (min)  33 min    Activity Tolerance  Patient tolerated treatment well    Behavior During Therapy  [facility] for tasks assessed/performed       Past Medical History:  Diagnosis Date  . Family history of breast cancer    mother  . Family history of colon cancer    father  . Glaucoma   . Osteopenia     Past Surgical History:  Procedure Laterality Date  . APPENDECTOMY    . CESAREAN SECTION     x6  . COLECTOMY    . ESOPHAGOGASTRODUODENOSCOPY  07/07/2012   Procedure: ESOPHAGOGASTRODUODENOSCOPY (EGD);  Surgeon: Arta Silence, MD;  Location: [facility] ENDOSCOPY;  Service: Endoscopy;  Laterality: N/A;  . LEFT COLECTOMY  04/30/2011   Sr Streck  . tubal cyst  04/30/2011   L fallopian tube cyst incidentally found    There were no vitals filed for this visit.  Subjective Assessment - 12/07/17 1118    Subjective  Pt has been doing the exercises, states no issues or concerns at this time    Currently in Pain?  No/denies    Multiple Pain Sites  No                       [facility] Adult PT Treatment/Exercise - 12/07/17 0001      Exercises   Exercises  Knee/Hip      Knee/Hip Exercises: Standing   Hip Flexion  Stengthening;Right;Left;1 set;5 reps;Knee bent 5 sec hold for balance    Hip Abduction  Right;Left;10 reps;Stengthening;Knee straight    Hip Extension  Stengthening;Right;Left;10 reps;Knee straight      Shoulder Exercises: Supine   Horizontal ABduction  Strengthening;Both;15 reps;Theraband    Theraband Level (Shoulder Horizontal ABduction)  Level 1 (Yellow)    External Rotation  Strengthening;Both;10 reps;Theraband    Theraband Level (Shoulder External Rotation)  Level 1 (Yellow)      Shoulder Exercises: Seated   Horizontal ABduction  Strengthening;Both;15 reps;Theraband    Theraband Level (Shoulder Horizontal ABduction)  Level 1 (Yellow)    External Rotation  Strengthening;Both;15 reps;Theraband    Theraband Level (Shoulder External Rotation)  Level 1 (Yellow)       leg lengthener and hip extension in supine - 10x 5 sec hold      PT Education - 12/07/17 1139    Education provided  Yes    Education Details   Access Code: [email or web address]     Person(s) Educated  Patient    Methods  Explanation;Demonstration;Handout    Comprehension  Verbalized understanding;Returned demonstration          PT Long Term Goals - 11/29/17 1733      PT LONG TERM GOAL #1   Title  understand how to exercise at home to improve strength and balance safely    Time  4    Period  Weeks  Status  New    Target Date  12/27/17      PT LONG TERM GOAL #2   Title  understand correct posture with daily tasks to reduce strain on the spine to reduce risk of fractures    Time  4    Period  Weeks    Status  New    Target Date  12/27/17      PT LONG TERM GOAL #3   Title  understand how to manage osteoporosis with correct posture and weightbearing activities    Time  4    Period  Weeks    Status  New    Target Date  12/27/17      PT LONG TERM GOAL #4   Title  Berg balance score >/= 52/56 due to improved balance    Time  4    Period  Weeks    Status  New    Target Date  12/27/17            Plan - 12/07/17 1114    Clinical Impression Statement  Pt was able to hold tandem for count of 30 on each side.  She needs UE support for standing LE exercises.  Pt has not met any goals yet due to only one treatment since eval.  She will  benefit from skilled PT to work on posture and balance for reduced risk of falls and improved strength.    PT Treatment/Interventions  Neuromuscular re-education;Therapeutic activities;Therapeutic exercise;Manual techniques;Energy conservation;Passive range of motion    PT Next Visit Plan  tips to avoid falls; information about osteoporosis; balance exercises; hip and knee strength; stretches for legs and back    Consulted and Agree with Plan of Care  Patient       Patient will benefit from skilled therapeutic intervention in order to improve the following deficits and impairments:  Increased fascial restricitons, Decreased mobility, Decreased balance, Decreased strength  Visit Diagnosis: Muscle weakness (generalized)  Other abnormalities of gait and mobility  Osteoporosis without current pathological fracture, unspecified osteoporosis type     Problem List Patient Active Problem List   Diagnosis Date Noted  . 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 [place], [place], [address] Phone: [phone]   Fax:  [phone]  Name: Laurie [name] MRN: [id number] Date of Birth: April 04, 1932

## 2017-12-07 NOTE — Patient Instructions (Signed)
Access Code: [email or web address]  URL: [email or web address]  Date: 12/07/2017  Prepared by: Lovett Calender   Exercises  Standing Single Leg Stance with Counter Support - 5 reps - 10 hold - 1x daily - 7x weekly  Tandem Stance with Support - 5 reps - 1 sets - 30 hold - 1x daily - 7x weekly  Supine Shoulder Horizontal Abduction with Resistance - 10 reps - 2 sets - 1x daily - 7x weekly  Supine Shoulder External Rotation with Resistance - 10 reps - 2 sets - 1x daily - 7x weekly  Patient Education  How to Prevent [facility] [address], [place], [place] [address] Phone # [phone] Fax [phone]

## 2017-12-14 ENCOUNTER — Encounter: Payer: Medicare Other | Admitting: Physical Therapy

## 2017-12-16 ENCOUNTER — Ambulatory Visit: Payer: Medicare Other | Admitting: Physical Therapy

## 2017-12-16 ENCOUNTER — Encounter: Payer: Self-pay | Admitting: Physical Therapy

## 2017-12-16 DIAGNOSIS — M6281 Muscle weakness (generalized): Secondary | ICD-10-CM

## 2017-12-16 DIAGNOSIS — M810 Age-related osteoporosis without current pathological fracture: Secondary | ICD-10-CM

## 2017-12-16 DIAGNOSIS — R2689 Other abnormalities of gait and mobility: Secondary | ICD-10-CM

## 2017-12-16 NOTE — Therapy (Signed)
[facility] [address], [place], [place], [address] Phone: [phone]   Fax:  [phone]  Physical Therapy Treatment  Patient Details  Name: Laurie [name] MRN: [id number] Date of Birth: [birth year] Referring Provider: Dr. Orion Crook   Encounter Date: 12/16/2017  PT End of Session - 12/16/17 1058    Visit Number  3    Date for PT Re-Evaluation  12/27/17    Authorization Type  UHC    PT Start Time  1018    PT Stop Time  1058    PT Time Calculation (min)  40 min    Activity Tolerance  Patient tolerated treatment well    Behavior During Therapy  [facility] for tasks assessed/performed       Past Medical History:  Diagnosis Date  . Family history of breast cancer    mother  . Family history of colon cancer    father  . Glaucoma   . Osteopenia     Past Surgical History:  Procedure Laterality Date  . APPENDECTOMY    . CESAREAN SECTION     x6  . COLECTOMY    . ESOPHAGOGASTRODUODENOSCOPY  07/07/2012   Procedure: ESOPHAGOGASTRODUODENOSCOPY (EGD);  Surgeon: Arta Silence, MD;  Location: [facility] ENDOSCOPY;  Service: Endoscopy;  Laterality: N/A;  . LEFT COLECTOMY  04/30/2011   Sr Streck  . tubal cyst  04/30/2011   L fallopian tube cyst incidentally found    There were no vitals filed for this visit.  Subjective Assessment - 12/16/17 1021    Subjective  Pt had some knee pain with the exercises but stopped and it felt better.  She is feeling fine now and denies pain.    Patient Stated Goals  education on osteoporoses    Currently in Pain?  No/denies                       [facility] Adult PT Treatment/Exercise - 12/16/17 0001      Neuro Re-ed    Neuro Re-ed Details   educated on posture throughout exercises      Knee/Hip Exercises: Stretches   Active Hamstring Stretch  Right;Left;3 reps;10 seconds      Knee/Hip Exercises: Supine   Bridges  Strengthening;Both;15 reps cues for posture and  alignment of LE; go slow and don't plop    Nature conservation officer;Both    Straight Leg Raises  Strengthening;Right;Left      Shoulder Exercises: Standing   Extension  Strengthening;20 reps;Theraband    Theraband Level (Shoulder Extension)  Level 1 (Yellow)    Row  Strengthening;20 reps;Theraband    Theraband Level (Shoulder Row)  Level 2 (Red)      Shoulder Exercises: ROM/Strengthening   UBE (Upper Arm Bike)  L0 2 x2 fwd/back    Other ROM/Strengthening Exercises  pec stretch in doorway 3x 10 sec hold             PT Education - 12/16/17 1106    Education provided  Yes    Education Details   Access Code: [email or web address]     Person(s) Educated  Patient    Methods  Explanation;Demonstration;Handout    Comprehension  Verbalized understanding;Returned demonstration          PT Long Term Goals - 12/16/17 1157      PT LONG TERM GOAL #1   Title  understand how to exercise at home to improve strength and balance safely    Time  4    Period  Weeks    Status  On-going      PT LONG TERM GOAL #2   Title  understand correct posture with daily tasks to reduce strain on the spine to reduce risk of fractures    Time  4    Period  Weeks    Status  On-going      PT LONG TERM GOAL #3   Title  understand how to manage osteoporosis with correct posture and weightbearing activities    Time  4    Period  Weeks    Status  On-going      PT LONG TERM GOAL #4   Title  Berg balance score >/= 52/56 due to improved balance    Time  4    Period  Weeks    Status  On-going            Plan - 12/16/17 1152    Clinical Impression Statement  Pt was monitored for posture throughout.  She is doing well with exercises and able to demonstrate good posture with verbal cues.  Pt feels good with exercises and understands how to perform. She was educated on importance of HEP.  She verbalized ability to use techniques on how to avoid falls.  Pt will benefit from skilled PT to progress  strenght and balance for safe discharge with HEP and to reduce risk of falls.    PT Treatment/Interventions  Neuromuscular re-education;Therapeutic activities;Therapeutic exercise;Manual techniques;Energy conservation;Passive range of motion    PT Next Visit Plan  balance exercises; hip and knee strength; stretches for legs and back    Consulted and Agree with Plan of Care  Patient       Patient will benefit from skilled therapeutic intervention in order to improve the following deficits and impairments:  Increased fascial restricitons, Decreased mobility, Decreased balance, Decreased strength  Visit Diagnosis: Muscle weakness (generalized)  Other abnormalities of gait and mobility  Osteoporosis without current pathological fracture, unspecified osteoporosis type     Problem List Patient Active Problem List   Diagnosis Date Noted  . 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, [place], [place], [address] Phone: [phone]   Fax:  [phone]  Name: Laurie [name] MRN: [id number] Date of Birth: 18-Mar-1932

## 2017-12-16 NOTE — Patient Instructions (Signed)
Access Code: [email or web address]  URL: [email or web address]  Date: 12/16/2017  Prepared by: Lovett Calender   Exercises  Standing Single Leg Stance with Counter Support - 5 reps - 10 hold - 1x daily - 7x weekly  Tandem Stance with Support - 5 reps - 1 sets - 30 hold - 1x daily - 7x weekly  Supine Shoulder Horizontal Abduction with Resistance - 10 reps - 2 sets - 1x daily - 7x weekly  Supine Shoulder External Rotation with Resistance - 10 reps - 2 sets - 1x daily - 7x weekly  Supine Bridge - 10 reps - 2 sets - 1x daily - 7x weekly  Hooklying Clamshell with Resistance - 10 reps - 2 sets - 1x daily - 7x weekly  Seated Scapular Retraction - 10 reps - 1 sets - 5 sec hold - 3x daily - 7x weekly  Scapular Retraction with Resistance Advanced - 10 reps - 3 sets - 1x daily - 7x weekly  Patient Education  How to Prevent Falls

## 2017-12-23 ENCOUNTER — Ambulatory Visit: Payer: Medicare Other | Admitting: Physical Therapy

## 2017-12-23 DIAGNOSIS — M6281 Muscle weakness (generalized): Secondary | ICD-10-CM

## 2017-12-23 DIAGNOSIS — M810 Age-related osteoporosis without current pathological fracture: Secondary | ICD-10-CM

## 2017-12-23 DIAGNOSIS — R2689 Other abnormalities of gait and mobility: Secondary | ICD-10-CM

## 2017-12-23 NOTE — Therapy (Signed)
[facility] [address], [place], [place], [address] Phone: [phone]   Fax:  [phone]  Physical Therapy Treatment  Patient Details  Name: Laurie [name] MRN: [id number] Date of Birth: 13-Aug-1931 Referring Provider: Dr. Orion Crook   Encounter Date: 12/23/2017  PT End of Session - 12/23/17 1049    Visit Number  4    Date for PT Re-Evaluation  12/27/17    Authorization Type  [employer]    PT Start Time  1017    PT Stop Time  1056    PT Time Calculation (min)  39 min    Activity Tolerance  Patient tolerated treatment well    Behavior During Therapy  [facility] for tasks assessed/performed       Past Medical History:  Diagnosis Date  . Family history of breast cancer    mother  . Family history of colon cancer    father  . Glaucoma   . Osteopenia     Past Surgical History:  Procedure Laterality Date  . APPENDECTOMY    . CESAREAN SECTION     x6  . COLECTOMY    . ESOPHAGOGASTRODUODENOSCOPY  07/07/2012   Procedure: ESOPHAGOGASTRODUODENOSCOPY (EGD);  Surgeon: Arta Silence, MD;  Location: [facility] ENDOSCOPY;  Service: Endoscopy;  Laterality: N/A;  . LEFT COLECTOMY  04/30/2011   Sr Streck  . tubal cyst  04/30/2011   L fallopian tube cyst incidentally found    There were no vitals filed for this visit.      [facility] PT Assessment - 12/23/17 0001      Berg Balance Test   Sit to Stand  Able to stand without using hands and stabilize independently    Standing Unsupported  Able to stand safely 2 minutes    Sitting with Back Unsupported but Feet Supported on Floor or Stool  Able to sit safely and securely 2 minutes    Stand to Sit  Sits safely with minimal use of hands    Transfers  Able to transfer safely, minor use of hands    Standing Unsupported with Eyes Closed  Able to stand 10 seconds safely    Standing Ubsupported with Feet Together  Able to place feet together independently and stand 1 minute safely    From Standing, Reach Forward with Outstretched Arm  Can reach confidently >25 cm (10")    From Standing Position, Pick up Object from Floor  Able to pick up shoe safely and easily    From Standing Position, Turn to Look Behind Over each Shoulder  Looks behind from both sides and weight shifts well    Turn 360 Degrees  Able to turn 360 degrees safely in 4 seconds or less    Standing Unsupported, Alternately Place Feet on Step/Stool  Able to stand independently and safely and complete 8 steps in 20 seconds    Standing Unsupported, One Foot in Front  Able to plae foot ahead of the other independently and hold 30 seconds    Standing on One Leg  Able to lift leg independently and hold equal to or more than 3 seconds    Total Score  53                   [facility] Adult PT Treatment/Exercise - 12/23/17 0001      Neuro Re-ed    Neuro Re-ed Details   educated on posture throughout exercises      Knee/Hip Exercises: Stretches  Active Hamstring Stretch  Right;Left;3 reps;10 seconds      Knee/Hip Exercises: Aerobic   Nustep  L2 x 8 min      Knee/Hip Exercises: Seated   Long Arc Quad  Strengthening;Both;10 reps bracing core    Clamshell with TheraBand  Green 20x      Knee/Hip Exercises: Supine   Bridges  --    Bridges with Oceanographer;Both    Straight Leg Raises  Strengthening;Right;Left    Other Supine Knee/Hip Exercises  leg lengthener - 10x 5 sec hold      Shoulder Exercises: Standing   Extension  Strengthening;20 reps;Theraband    Theraband Level (Shoulder Extension)  Level 1 (Yellow)    Row  Strengthening;20 reps;Theraband    Theraband Level (Shoulder Row)  Level 2 (Red)      Shoulder Exercises: ROM/Strengthening   UBE (Upper Arm Bike)  --    Other ROM/Strengthening Exercises  --                  PT Long Term Goals - 12/23/17 1050      PT LONG TERM GOAL #1   Title  understand how to exercise at home to improve strength and balance safely     Status  Achieved      PT LONG TERM GOAL #2   Title  understand correct posture with daily tasks to reduce strain on the spine to reduce risk of fractures    Status  Achieved      PT LONG TERM GOAL #3   Title  understand how to manage osteoporosis with correct posture and weightbearing activities    Status  Achieved      PT LONG TERM GOAL #4   Title  Berg balance score >/= 52/56 due to improved balance    Baseline  53/56    Status  Achieved            Plan - 12/23/17 1049    Clinical Impression Statement  Pt is doing well and demonstrates good posture corrections and is able to perform exercises correctly.  She has met long term goal for improved balance with 53/56 Berg score.  Pt will discharge with HEP today.    PT Treatment/Interventions  Neuromuscular re-education;Therapeutic activities;Therapeutic exercise;Manual techniques;Energy conservation;Passive range of motion    PT Next Visit Plan  discharged today    Consulted and Agree with Plan of Care  Patient       Patient will benefit from skilled therapeutic intervention in order to improve the following deficits and impairments:  Increased fascial restricitons, Decreased mobility, Decreased balance, Decreased strength  Visit Diagnosis: No diagnosis found.     Problem List Patient Active Problem List   Diagnosis Date Noted  . 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[place]) 12/22/2010    Zannie Cove, PT 12/23/2017, 10:51 AM  [facility] [address], [place], [place], [address] Phone: [phone]   Fax:  [phone]  Name: Laurie [name] MRN: [id number] Date of Birth: [birth year]   PHYSICAL THERAPY DISCHARGE SUMMARY  Visits from Start of Care: 4  Current functional level related to goals / functional outcomes: See above goals   Remaining deficits: See above details   Education / Equipment: HEP  Plan: Patient agrees to discharge.  Patient goals  were met. Patient is being discharged due to meeting the stated rehab goals.  ?????    [employer], PT 12/23/17 10:56 AM

## 2018-01-25 ENCOUNTER — Ambulatory Visit (INDEPENDENT_AMBULATORY_CARE_PROVIDER_SITE_OTHER): Payer: Medicare Other

## 2018-01-25 ENCOUNTER — Ambulatory Visit (INDEPENDENT_AMBULATORY_CARE_PROVIDER_SITE_OTHER): Payer: Medicare Other | Admitting: Orthopaedic Surgery

## 2018-01-25 DIAGNOSIS — M1711 Unilateral primary osteoarthritis, right knee: Secondary | ICD-10-CM

## 2018-01-25 DIAGNOSIS — M25561 Pain in right knee: Secondary | ICD-10-CM | POA: Diagnosis not present

## 2018-01-25 DIAGNOSIS — G8929 Other chronic pain: Secondary | ICD-10-CM

## 2018-01-25 DIAGNOSIS — M1712 Unilateral primary osteoarthritis, left knee: Secondary | ICD-10-CM

## 2018-01-25 DIAGNOSIS — M25562 Pain in left knee: Secondary | ICD-10-CM

## 2018-01-25 MED ORDER — METHYLPREDNISOLONE ACETATE 40 MG/ML IJ SUSP
40.0000 mg | INTRAMUSCULAR | Status: AC | PRN
  Administered 2018-01-25: 40 mg via INTRA_ARTICULAR

## 2018-01-25 MED ORDER — LIDOCAINE HCL 1 % IJ SOLN
3.0000 mL | INTRAMUSCULAR | Status: AC | PRN
Start: 2018-01-25 — End: 2018-01-25
  Administered 2018-01-25: 3 mL

## 2018-01-25 MED ORDER — LIDOCAINE HCL 1 % IJ SOLN
3.0000 mL | INTRAMUSCULAR | Status: AC | PRN
  Administered 2018-01-25: 3 mL

## 2018-01-25 NOTE — Progress Notes (Signed)
Office Visit Note   Patient: Laurie [name]           Date of Birth: March 02, 1932           MRN: [id number] Visit Date: 01/25/2018              Requested by: Leighton Ruff, MD [place], [place] [address] PCP: Leighton Ruff, MD   Assessment & Plan: Visit Diagnoses:  1. Chronic pain of both knees   2. Unilateral primary osteoarthritis, left knee   3. Unilateral primary osteoarthritis, right knee     Plan: I went over x-rays with her in detail and she understands that she does have quite significant arthritis in both her knees.  I do feel she would benefit from a steroid injection in both knees and she agrees with this.  She understands the risk and benefits of injections and tolerated them well.  She is definitely candidate for hyaluronic acid as well.  I gave her handout about this we will order this for both her knees to be placed and hopefully a month from now.  All question concerns were answered and addressed.  She tolerated injections well and felt better after them.  Follow-Up Instructions: Return in about 1 month (around 02/22/2018).   Orders:  Orders Placed This Encounter  Procedures  . Large Joint Inj  . Large Joint Inj  . XR Knee 1-2 Views Left  . XR Knee 1-2 Views Right   No orders of the defined types were placed in this encounter.     Procedures: Large Joint Inj: R knee on 01/25/2018 10:20 AM Indications: diagnostic evaluation and pain Details: 22 G 1.5 in needle, superolateral approach  Arthrogram: No  Medications: 3 mL lidocaine 1 %; 40 mg methylPREDNISolone acetate 40 MG/ML Outcome: tolerated well, no immediate complications Procedure, treatment alternatives, risks and benefits explained, specific risks discussed. Consent was given by the patient. Immediately prior to procedure a time out was called to verify the correct patient, procedure, equipment, support staff and site/side marked as required. Patient was prepped and draped in  the usual sterile fashion.   Large Joint Inj: L knee on 01/25/2018 10:20 AM Indications: diagnostic evaluation and pain Details: 22 G 1.5 in needle, superolateral approach  Arthrogram: No  Medications: 3 mL lidocaine 1 %; 40 mg methylPREDNISolone acetate 40 MG/ML Outcome: tolerated well, no immediate complications Procedure, treatment alternatives, risks and benefits explained, specific risks discussed. Consent was given by the patient. Immediately prior to procedure a time out was called to verify the correct patient, procedure, equipment, support staff and site/side marked as required. Patient was prepped and draped in the usual sterile fashion.       Clinical Data: No additional findings.   Subjective: Chief Complaint  Patient presents with  . Left Knee - Pain  The patient is a very pleasant 84 year old who comes in with bilateral knee pain is been hurting for about 3 or 4 months now.  She denies any specific injuries and this is been a long and slow process of her knee started to hurt more.  She did have a twisting type of accident when she stepped the wrong way recently.  She says is a pretty constant ache.  It does wake her up at night.  She is been sitting for long period time and gets up to move she has bilateral knee pain.  She denies any locking catching.  She is never had any type of injections to  her knees.  She is never any type of injuries either.  She does have stage III kidney disease and cannot take anti-inflammatories.  She just takes Tylenol for pain.  HPI  Review of Systems She currently denies any headache, chest pain, shortness of breath, fever, chills, nausea, vomiting.  Objective: Vital Signs: There were no vitals taken for this visit.  Physical Exam She is alert and oriented x3 and in no acute distress Ortho Exam Examination of both knees show slight varus malalignment.  Both knees have slight effusion.  Both knees have patellofemoral crepitation from  flexion extension.  Both knees feel stable ligamentously. Specialty Comments:  No specialty comments available.  Imaging: Xr Knee 1-2 Views Left  Result Date: 01/25/2018 2 views of the left knee show tricompartmental arthritic changes with varus malalignment and medial joint space narrowing.  There are large para-articular osteophytes in all 3 compartments.  Xr Knee 1-2 Views Right  Result Date: 01/25/2018 2 views of the right knee shows severe tricompartmental arthritic changes.  There is varus malalignment with almost complete loss of medial joint space.  There are periarticular osteophytes throughout the knee and calcific changes in the meniscus.    PMFS History: Patient Active Problem List   Diagnosis Date Noted  . Unilateral primary osteoarthritis, left knee 01/25/2018  . Unilateral primary osteoarthritis, right knee 01/25/2018  . Chronic pain of both knees 01/25/2018  . 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[place]) 12/22/2010   Past Medical History:  Diagnosis Date  . Family history of breast cancer    mother  . Family history of colon cancer    father  . Glaucoma   . Osteopenia     Family History  Problem Relation Age of Onset  . Breast cancer Mother   . Colon cancer Father   . Diabetes Sister     Past Surgical History:  Procedure Laterality Date  . APPENDECTOMY    . CESAREAN SECTION     x6  . COLECTOMY    . ESOPHAGOGASTRODUODENOSCOPY  07/07/2012   Procedure: ESOPHAGOGASTRODUODENOSCOPY (EGD);  Surgeon: Arta Silence, MD;  Location: [facility] ENDOSCOPY;  Service: Endoscopy;  Laterality: N/A;  . LEFT COLECTOMY  04/30/2011   Sr Streck  . tubal cyst  04/30/2011   L fallopian tube cyst incidentally found   Social History   Occupational History  . Not on file  Tobacco Use  . Smoking status: Former Smoker    Packs/day: 0.50  Substance and Sexual Activity  . Alcohol use: No    Comment: rare  . Drug use: No  . Sexual activity: Not on file

## 2018-01-26 ENCOUNTER — Telehealth (INDEPENDENT_AMBULATORY_CARE_PROVIDER_SITE_OTHER): Payer: Self-pay

## 2018-01-26 ENCOUNTER — Other Ambulatory Visit (INDEPENDENT_AMBULATORY_CARE_PROVIDER_SITE_OTHER): Payer: Self-pay

## 2018-01-26 NOTE — Telephone Encounter (Signed)
Submitted application online for Monovisc, bilateral knee 

## 2018-01-26 NOTE — Telephone Encounter (Signed)
Bilateral knee gel injections 

## 2018-01-26 NOTE — Telephone Encounter (Signed)
Noted  

## 2018-02-01 ENCOUNTER — Telehealth (INDEPENDENT_AMBULATORY_CARE_PROVIDER_SITE_OTHER): Payer: Self-pay

## 2018-02-01 NOTE — Telephone Encounter (Signed)
Patient approved for Monovisc, bilateral knee. Covered at 100% Buy & Bill No PA required

## 2018-02-23 ENCOUNTER — Ambulatory Visit (INDEPENDENT_AMBULATORY_CARE_PROVIDER_SITE_OTHER): Payer: Medicare Other | Admitting: Orthopaedic Surgery

## 2018-02-23 ENCOUNTER — Encounter (INDEPENDENT_AMBULATORY_CARE_PROVIDER_SITE_OTHER): Payer: Self-pay | Admitting: Orthopaedic Surgery

## 2018-02-23 DIAGNOSIS — M1712 Unilateral primary osteoarthritis, left knee: Secondary | ICD-10-CM | POA: Diagnosis not present

## 2018-02-23 DIAGNOSIS — M1711 Unilateral primary osteoarthritis, right knee: Secondary | ICD-10-CM | POA: Diagnosis not present

## 2018-02-23 MED ORDER — HYALURONAN 88 MG/4ML IX SOSY
88.0000 mg | PREFILLED_SYRINGE | INTRA_ARTICULAR | Status: AC | PRN
  Administered 2018-02-23: 88 mg via INTRA_ARTICULAR

## 2018-02-23 MED ORDER — HYALURONAN 88 MG/4ML IX SOSY
88.0000 mg | PREFILLED_SYRINGE | INTRA_ARTICULAR | Status: AC | PRN
Start: 2018-02-23 — End: 2018-02-23
  Administered 2018-02-23: 88 mg via INTRA_ARTICULAR

## 2018-02-23 NOTE — Progress Notes (Signed)
   Procedure Note  Patient: Laurie [name]             Date of Birth: 09-08-1931           MRN: [id number]             Visit Date: 02/23/2018  Procedures: Visit Diagnoses: Unilateral primary osteoarthritis, left knee  Unilateral primary osteoarthritis, right knee  Large Joint Inj: R knee on 02/23/2018 9:44 AM Indications: diagnostic evaluation and pain Details: 22 G 1.5 in needle, superolateral approach  Arthrogram: No  Medications: 88 mg Hyaluronan 88 MG/4ML Outcome: tolerated well, no immediate complications Procedure, treatment alternatives, risks and benefits explained, specific risks discussed. Consent was given by the patient. Immediately prior to procedure a time out was called to verify the correct patient, procedure, equipment, support staff and site/side marked as required. Patient was prepped and draped in the usual sterile fashion.   Large Joint Inj on 02/23/2018 9:45 AM Indications: diagnostic evaluation and pain Details: 22 G 1.5 in needle, superolateral approach  Arthrogram: No  Medications: 88 mg Hyaluronan 88 MG/4ML Outcome: tolerated well, no immediate complications Procedure, treatment alternatives, risks and benefits explained, specific risks discussed. Consent was given by the patient. Immediately prior to procedure a time out was called to verify the correct patient, procedure, equipment, support staff and site/side marked as required. Patient was prepped and draped in the usual sterile fashion.    The patient is here today for scheduled hyaluronic acid injections with Monovisc in both knees to treat the pain from osteoarthritis.  She is 84 years old.  She has had steroid injections in her knees and this is helped temporize her pain.  On exam both knees have some patellofemoral crepitation with good range of motion.  Both knees have just a slight effusion with varus malalignment.  She tolerated the Monovisc injections well in both knees.  She understands  again this is treating the pain from osteoarthritis.  All questions concerns were answered and addressed.  Follow-up will be as needed.

## 2018-03-09 ENCOUNTER — Other Ambulatory Visit: Payer: Self-pay | Admitting: Family Medicine

## 2018-03-09 DIAGNOSIS — Z1231 Encounter for screening mammogram for malignant neoplasm of breast: Secondary | ICD-10-CM

## 2018-04-11 ENCOUNTER — Ambulatory Visit
Admission: RE | Admit: 2018-04-11 | Discharge: 2018-04-11 | Disposition: A | Discharge status: Home / Self Care | Payer: Medicare Other | Source: Ambulatory Visit | Attending: Family Medicine | Admitting: Family Medicine

## 2018-04-11 DIAGNOSIS — Z1231 Encounter for screening mammogram for malignant neoplasm of breast: Secondary | ICD-10-CM

## 2018-07-04 ENCOUNTER — Ambulatory Visit (INDEPENDENT_AMBULATORY_CARE_PROVIDER_SITE_OTHER): Payer: Medicare Other | Admitting: Orthopaedic Surgery

## 2018-07-04 ENCOUNTER — Encounter (INDEPENDENT_AMBULATORY_CARE_PROVIDER_SITE_OTHER): Payer: Self-pay | Admitting: Orthopaedic Surgery

## 2018-07-04 DIAGNOSIS — S8001XA Contusion of right knee, initial encounter: Secondary | ICD-10-CM

## 2018-07-04 MED ORDER — LIDOCAINE HCL 1 % IJ SOLN
3.0000 mL | INTRAMUSCULAR | Status: AC | PRN
Start: 2018-07-04 — End: 2018-07-04
  Administered 2018-07-04: 3 mL

## 2018-07-04 MED ORDER — METHYLPREDNISOLONE ACETATE 40 MG/ML IJ SUSP
40.0000 mg | INTRAMUSCULAR | Status: AC | PRN
Start: 2018-07-04 — End: 2018-07-04
  Administered 2018-07-04: 40 mg via INTRA_ARTICULAR

## 2018-07-04 NOTE — Progress Notes (Signed)
Office Visit Note   Patient: Laurie [name]           Date of Birth: 1932-06-06           MRN: [id number] Visit Date: 07/04/2018              Requested by: Leighton Ruff, MD [place], [place] [address] PCP: Leighton Ruff, MD   Assessment & Plan: Visit Diagnoses:  1. Contusion of right knee, initial encounter     Plan: Certainly a cane will help offload that knee if she is walking around in public she needs to have a cane in her opposite hand.  She says she has one at home.  I did provide a steroid injection in her right knee to see if this will help.  She also needs to consider compressive garments and staying off the leg at times so she can elevate it to help with swelling.  She understands this fully.  We will see her back in 4 weeks for repeat exam.  If there is any issues prior to then she will let us know.  Follow-Up Instructions: Return in about 4 weeks (around 08/01/2018).   Orders:  No orders of the defined types were placed in this encounter.  No orders of the defined types were placed in this encounter.     Procedures: Large Joint Inj: R knee on 07/04/2018 4:12 PM Indications: diagnostic evaluation and pain Details: 22 G 1.5 in needle, superolateral approach  Arthrogram: No  Medications: 3 mL lidocaine 1 %; 40 mg methylPREDNISolone acetate 40 MG/ML Outcome: tolerated well, no immediate complications Procedure, treatment alternatives, risks and benefits explained, specific risks discussed. Consent was given by the patient. Immediately prior to procedure a time out was called to verify the correct patient, procedure, equipment, support staff and site/side marked as required. Patient was prepped and draped in the usual sterile fashion.       Clinical Data: No additional findings.   Subjective: Chief Complaint  Patient presents with  . Right Knee - Pain  Patient is a very pleasant 84 year old female well-known to [place].  She has  bilateral knee osteoarthritis with the right worse than left.  We actually injected her knees back in July and she says she has been pain-free up until last week.  She had a significant fall landing on her right knee leg foot and ankle.  She went to a walk-in urgent care clinic and they obtain x-rays of her knee.  She has been able to walk.  She uses a walker at home.  I recommended a cane for her when she is out in public.  She has swelling down to her foot and ankle with bruising as well.  The x-rays of her knee were read as negative but we can see them today.  She did not injure anything else when she fell.  HPI  Review of Systems She currently denies any headache, chest pain, shortness of breath, fever, chills, nausea, vomiting.  Objective: Vital Signs: There were no vitals taken for this visit.  Physical Exam She is alert and oriented x3 and in no acute distress Ortho Exam Examination of her right knee shows no significant swelling or effusion but there is significant patellofemoral arthritic changes on clinical exam with swelling from her knee down her foot and ankle and significant bruising.  Her ankle exam is stable on exam as well as her knee exam.  Her calf is soft but again she  has global swelling down that leg. Specialty Comments:  No specialty comments available.  Imaging: No results found. Independent review of the x-rays of her right knee shows severe arthritic changes in all 3 compartments with osteopenic bone but no evidence of an acute injury or fracture.  PMFS History: Patient Active Problem List   Diagnosis Date Noted  . Unilateral primary osteoarthritis, left knee 01/25/2018  . Unilateral primary osteoarthritis, right knee 01/25/2018  . Chronic pain of both knees 01/25/2018  . 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[place]) 12/22/2010   Past Medical History:  Diagnosis Date  . Family history of breast cancer    mother  . Family history of colon cancer    father  . Glaucoma     . Osteopenia     Family History  Problem Relation Age of Onset  . Breast cancer Mother   . Colon cancer Father   . Diabetes Sister     Past Surgical History:  Procedure Laterality Date  . APPENDECTOMY    . CESAREAN SECTION     x6  . COLECTOMY    . ESOPHAGOGASTRODUODENOSCOPY  07/07/2012   Procedure: ESOPHAGOGASTRODUODENOSCOPY (EGD);  Surgeon: Arta Silence, MD;  Location: [facility] ENDOSCOPY;  Service: Endoscopy;  Laterality: N/A;  . LEFT COLECTOMY  04/30/2011   Sr Streck  . tubal cyst  04/30/2011   L fallopian tube cyst incidentally found   Social History   Occupational History  . Not on file  Tobacco Use  . Smoking status: Former Smoker    Packs/day: 0.50  Substance and Sexual Activity  . Alcohol use: No    Comment: rare  . Drug use: No  . Sexual activity: Not on file

## 2018-08-02 ENCOUNTER — Ambulatory Visit (INDEPENDENT_AMBULATORY_CARE_PROVIDER_SITE_OTHER): Payer: Medicare Other | Admitting: Orthopaedic Surgery

## 2018-08-02 DIAGNOSIS — M25561 Pain in right knee: Secondary | ICD-10-CM | POA: Diagnosis not present

## 2018-08-02 DIAGNOSIS — S8001XA Contusion of right knee, initial encounter: Secondary | ICD-10-CM

## 2018-08-02 DIAGNOSIS — G8929 Other chronic pain: Secondary | ICD-10-CM

## 2018-08-02 DIAGNOSIS — M25562 Pain in left knee: Secondary | ICD-10-CM | POA: Diagnosis not present

## 2018-08-02 NOTE — Progress Notes (Signed)
The patient is a 85 year old female who I saw last month after she had a mechanical fall and sustained a contusion to her arthritic right knee.  She had a significant minor swelling around the knee and we placed a steroid injection in the knee.  She states she is doing very well in the injection was helpful.  She walks without assistive device.  She is driving and doing well overall she states.  On examination of her right knee is still does have a significant of swelling comparing the right and left knees but her range of motion is full and she is pain-free.  At this point we will just watch her knee.  I would only recommend any other intervention if she is having problems a month from now.  She will let us know if there is any issues and come back and see [place] otherwise follow-up will be as needed.  All question concerns were answered and addressed.

## 2018-12-08 ENCOUNTER — Other Ambulatory Visit: Payer: Self-pay

## 2018-12-08 ENCOUNTER — Ambulatory Visit: Payer: Self-pay

## 2018-12-08 ENCOUNTER — Encounter: Payer: Self-pay | Admitting: Orthopaedic Surgery

## 2018-12-08 ENCOUNTER — Ambulatory Visit (INDEPENDENT_AMBULATORY_CARE_PROVIDER_SITE_OTHER): Payer: Medicare Other | Admitting: Orthopaedic Surgery

## 2018-12-08 DIAGNOSIS — M25511 Pain in right shoulder: Secondary | ICD-10-CM

## 2018-12-08 NOTE — Progress Notes (Signed)
Office Visit Note   Patient: Laurie [name]           Date of Birth: 19-Dec-1931           MRN: [id number] Visit Date: 12/08/2018              Requested by: Laurie Ruff, MD [place], [place] [address] PCP: Laurie Ruff, MD   Assessment & Plan: Visit Diagnoses:  1. Acute pain of right shoulder     Plan:  She is shown forward flexion exercises and wall crawls which she will perform on her own at home.  See her back in 2 weeks check her response to the cortisone injection and also to the exercises.  Questions were encouraged and answered.  She can always cancel the appointment if she is doing well.  Questions were encouraged and answered. Follow-Up Instructions: Return in about 2 weeks (around 12/22/2018).   Orders:  Orders Placed This Encounter  Procedures  . XR Shoulder Right   No orders of the defined types were placed in this encounter.     Procedures: No procedures performed   Clinical Data: No additional findings.   Subjective: Chief Complaint  Patient presents with  . Right Shoulder - Pain    HPI Laurie [name] is well-known to our service comes in today due to right shoulder pain.  She states she has had increasing pain in the right shoulder with decreased range of motion.  Difficulty elevating the arm above her head and reaching backwards.  This been ongoing for the past month with increasing pain over the past 2 weeks.  She is tried Tylenol without any relief.  She is had no known injury to the right shoulder.  Never had prior injury or surgery on this shoulder.  She denies any radicular symptoms down the right arm.  Review of Systems Denies fevers chills.  Please see HPI otherwise negative or noncontributory.  Objective: Vital Signs: There were no vitals taken for this visit.  Physical Exam Constitutional:      Appearance: She is not ill-appearing or diaphoretic.  Pulmonary:     Effort: Pulmonary effort is normal.   Neurological:     Mental Status: She is alert and oriented to person, place, and time.     Ortho Exam Bilateral shoulder she has 5 out of 5 strength with external and internal rotation against resistance.  Empty can test is negative bilaterally.  Positive impingement testing on the right negative on the left.  Forward flexion right shoulder to approximately 160 270 degrees and left shoulder full range of motion.  Specialty Comments:  No specialty comments available.  Imaging: Xr Shoulder Right  Result Date: 12/08/2018 3 views right shoulder: No acute fracture.  Shoulders well located.  Glenohumeral joint is well-maintained.  Lung fields on the right appear clear .    PMFS History: Patient Active Problem List   Diagnosis Date Noted  . Unilateral primary osteoarthritis, left knee 01/25/2018  . Unilateral primary osteoarthritis, right knee 01/25/2018  . Chronic pain of both knees 01/25/2018  . 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[place]) 12/22/2010   Past Medical History:  Diagnosis Date  . Family history of breast cancer    mother  . Family history of colon cancer    father  . Glaucoma   . Osteopenia     Family History  Problem Relation Age of Onset  . Breast cancer Mother   . Colon cancer Father   . Diabetes  Sister     Past Surgical History:  Procedure Laterality Date  . APPENDECTOMY    . CESAREAN SECTION     x6  . COLECTOMY    . ESOPHAGOGASTRODUODENOSCOPY  07/07/2012   Procedure: ESOPHAGOGASTRODUODENOSCOPY (EGD);  Surgeon: Arta Silence, MD;  Location: [facility] ENDOSCOPY;  Service: Endoscopy;  Laterality: N/A;  . LEFT COLECTOMY  04/30/2011   Sr Streck  . tubal cyst  04/30/2011   L fallopian tube cyst incidentally found   Social History   Occupational History  . Not on file  Tobacco Use  . Smoking status: Former Smoker    Packs/day: 0.50  Substance and Sexual Activity  . Alcohol use: No    Comment: rare  . Drug use: No  . Sexual activity: Not on file

## 2018-12-26 ENCOUNTER — Ambulatory Visit: Payer: Medicare Other | Admitting: Orthopaedic Surgery

## 2019-01-02 ENCOUNTER — Ambulatory Visit: Payer: Medicare Other | Admitting: Orthopaedic Surgery

## 2019-02-20 ENCOUNTER — Ambulatory Visit: Payer: Self-pay

## 2019-02-20 ENCOUNTER — Ambulatory Visit (INDEPENDENT_AMBULATORY_CARE_PROVIDER_SITE_OTHER): Payer: Medicare Other | Admitting: Physician Assistant

## 2019-02-20 ENCOUNTER — Encounter: Payer: Self-pay | Admitting: Physician Assistant

## 2019-02-20 DIAGNOSIS — M542 Cervicalgia: Secondary | ICD-10-CM | POA: Diagnosis not present

## 2019-02-20 MED ORDER — METHYLPREDNISOLONE 4 MG PO TABS: ORAL_TABLET | ORAL | 0 refills | Status: DC

## 2019-02-20 NOTE — Addendum Note (Signed)
Addended by: Erskine Emery on: 02/20/2019 12:24 PM   Modules accepted: Orders

## 2019-02-20 NOTE — Progress Notes (Addendum)
Office Visit Note   Patient: Laurie [name]           Date of Birth: 1932/02/09           MRN: [id number] Visit Date: 02/20/2019              Requested by: Leighton Ruff, MD [place],  [place] [address] PCP: Leighton Ruff, MD   Assessment & Plan: Visit Diagnoses:  1. Neck pain     Plan: We will place her on a Medrol Dosepak.  Send her to physical therapy for range of motion strengthening home exercise program and modalities for her cervical spine.  Questions were encouraged and answered at length.  We will see her back in a month check.  Response to each treatments.  However if her condition worsens she can always call the office and she may require an MRI of her cervical spine in the future.  Follow-Up Instructions: Return in about 4 weeks (around 03/20/2019) for Dr. Ninfa Linden.   Orders:  Orders Placed This Encounter  Procedures  . XR Cervical Spine 2 or 3 views  . Ambulatory referral to Physical Therapy   Meds ordered this encounter  Medications  . methylPREDNISolone (MEDROL) 4 MG tablet    Sig: Take as directed    Dispense:  21 tablet    Refill:  0      Procedures: No procedures performed   Clinical Data: No additional findings.   Subjective: Chief Complaint  Patient presents with  . Neck - Pain    Bilateral arm pain with numbness in fingers.  . Right Shoulder - Pain  . Left Shoulder - Pain    HPI Laurie [name] 85 year old female known to Dr. Ninfa Linden service comes in today with bilateral arm shoulder and neck pain for the last week.  She states she has pain down both arms.  Has pains and needle sensation in bilateral hands throughout.  She is had no known injury.  Tylenol does help some with the numbness tingling in the hands.  Numbness tingling in hands wakes her at night.  She notes pain lateral aspect of bilateral humerus regions.  Review of Systems  Constitutional: Negative for chills and fever.  Musculoskeletal: Positive for  neck pain and neck stiffness.     Objective: Vital Signs: There were no vitals taken for this visit.  Physical Exam Constitutional:      Appearance: She is not ill-appearing or diaphoretic.  Pulmonary:     Effort: Pulmonary effort is normal.  Neurological:     Mental Status: She is alert and oriented to person, place, and time.  Psychiatric:        Mood and Affect: Mood normal.     Ortho Exam Cervical spine decreased range of motion particularly with rotation right and left.  She has tenderness over the lower cervical spinal column.  No tenderness over the medial border of the scapula bilaterally.  Out of 5 strength bilateral shoulders with external and internal rotation against resistance.  Biceps and triceps 5 out of 5 strength bilaterally.  Subjective decreased sensation throughout bilateral hands.  Negative Tinel's at the wrist bilaterally negative Phalen's bilaterally and negative compression test over the median nerves bilaterally.  Full motor bilateral hands.  Specialty Comments:  No specialty comments available.  Imaging: Xr Cervical Spine 2 Or 3 Views  Result Date: 02/20/2019 Cervical spine AP and lateral views: No acute fractures.  Degenerative changes mid to lower cervical spine.  Cervical  6-7 disc space not visualized on lateral view.  No spondylolisthesis.    PMFS History: Patient Active Problem List   Diagnosis Date Noted  . Unilateral primary osteoarthritis, left knee 01/25/2018  . Unilateral primary osteoarthritis, right knee 01/25/2018  . Chronic pain of both knees 01/25/2018  . 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[place]) 12/22/2010   Past Medical History:  Diagnosis Date  . Family history of breast cancer    mother  . Family history of colon cancer    father  . Glaucoma   . Osteopenia     Family History  Problem Relation Age of Onset  . Breast cancer Mother   . Colon cancer Father   . Diabetes Sister     Past Surgical History:  Procedure Laterality Date   . APPENDECTOMY    . CESAREAN SECTION     x6  . COLECTOMY    . ESOPHAGOGASTRODUODENOSCOPY  07/07/2012   Procedure: ESOPHAGOGASTRODUODENOSCOPY (EGD);  Surgeon: Arta Silence, MD;  Location: [facility] ENDOSCOPY;  Service: Endoscopy;  Laterality: N/A;  . LEFT COLECTOMY  04/30/2011   Sr Streck  . tubal cyst  04/30/2011   L fallopian tube cyst incidentally found   Social History   Occupational History  . Not on file  Tobacco Use  . Smoking status: Former Smoker    Packs/day: 0.50  Substance and Sexual Activity  . Alcohol use: No    Comment: rare  . Drug use: No  . Sexual activity: Not on file

## 2019-02-27 ENCOUNTER — Other Ambulatory Visit: Payer: Self-pay

## 2019-02-27 ENCOUNTER — Ambulatory Visit: Payer: Medicare Other | Attending: Psychiatry | Admitting: Physical Therapy

## 2019-02-27 ENCOUNTER — Encounter: Payer: Self-pay | Admitting: Physical Therapy

## 2019-02-27 DIAGNOSIS — R293 Abnormal posture: Secondary | ICD-10-CM | POA: Insufficient documentation

## 2019-02-27 DIAGNOSIS — M542 Cervicalgia: Secondary | ICD-10-CM | POA: Diagnosis not present

## 2019-02-27 DIAGNOSIS — M6281 Muscle weakness (generalized): Secondary | ICD-10-CM | POA: Insufficient documentation

## 2019-02-27 NOTE — Therapy (Signed)
[facility] [address], [place], [place], [address] Phone: [phone]   Fax:  [phone]  Physical Therapy Evaluation  Patient Details  Name: Laurie [name] MRN: [id number] Date of Birth: [birth year] Referring Provider (PT): Pete Pelt, [place]   Encounter Date: 02/27/2019  PT End of Session - 02/27/19 1028    Visit Number  1    Date for PT Re-Evaluation  04/24/19    PT Start Time  1017    PT Stop Time  1051    PT Time Calculation (min)  34 min    Activity Tolerance  Patient tolerated treatment well       Past Medical History:  Diagnosis Date  . Family history of breast cancer    mother  . Family history of colon cancer    father  . Glaucoma   . Osteopenia     Past Surgical History:  Procedure Laterality Date  . APPENDECTOMY    . CESAREAN SECTION     x6  . COLECTOMY    . ESOPHAGOGASTRODUODENOSCOPY  07/07/2012   Procedure: ESOPHAGOGASTRODUODENOSCOPY (EGD);  Surgeon: Arta Silence, MD;  Location[facility] ENDOSCOPY;  Service: Endoscopy;  Laterality: N/A;  . LEFT COLECTOMY  04/30/2011   Sr Streck  . tubal cyst  04/30/2011   L fallopian tube cyst incidentally found    There were no vitals filed for this visit.   Subjective Assessment - 02/27/19 1031    Subjective  Pt has been having neck pain into shoulders, difficulty reaching overhead with Rt UE, tingling in bilateral hands    Limitations  Lifting    Patient Stated Goals  be able to do things around the house, get rid of pain and tingling    Currently in Pain?  Yes    Pain Score  8     Pain Location  Neck    Pain Orientation  Right;Left;Mid;Lower    Pain Descriptors / Indicators  Sharp    Pain Type  Acute pain    Pain Radiating Towards  down to shoulders and tingling going into hands    Pain Onset  1 to 4 weeks ago    Pain Frequency  Intermittent    Aggravating Factors   lifting the Rt arm;    Pain Relieving Factors  hot shower    Effect  of Pain on Daily Activities  very slow at house work because I am afraid of using the arm too much    Multiple Pain Sites  No         [facility] PT Assessment - 02/27/19 0001      Assessment   Medical Diagnosis  M54.2 (ICD-10-CM) - Neck pain    Referring Provider (PT)  Pete Pelt, PA-C    Onset Date/Surgical Date  --   2 weeks   Hand Dominance  Right    Prior Therapy  No      Precautions   Precautions  None      Restrictions   Weight Bearing Restrictions  No      Balance Screen   Has the patient fallen in the past 6 months  No      [place] residence    Living Arrangements  Alone      Prior Function   Level of Independence  Independent      Cognition   Overall Cognitive Status  Within Functional Limits for tasks assessed  Observation/Other Assessments   Focus on Therapeutic Outcomes (FOTO)   37% limited      Posture/Postural Control   Posture/Postural Control  Postural limitations    Postural Limitations  Forward head;Increased thoracic kyphosis      ROM / Strength   AROM / PROM / Strength  AROM;Strength      AROM   Overall AROM Comments  Lt shoulder flexion 148; Rt shoulder flexion 110    AROM Assessment Site  Cervical    Cervical Flexion  70    Cervical Extension  32    Cervical - Right Rotation  38    Cervical - Left Rotation  35      Strength   Overall Strength Comments  Lt shoulder flexion 3/5; Rt 4-/5; Rt shoulder abductoin 3/5; Lt 4/5      Palpation   Palpation comment  suboccipitals and cervical paraspinals tight, pecs tight, throracic hypomobility      Special Tests    Special Tests  Cervical    Cervical Tests  Spurling's;Dictraction      Spurling's   Findings  Positive    Side  Left      Distraction Test   Findngs  Positive    Comment  reduced symptoms      Ambulation/Gait   Gait Pattern  Within Functional Limits                Objective measurements completed on examination: See  above findings.      [place] Adult PT Treatment/Exercise - 02/27/19 0001      Self-Care   Self-Care  Other Self-Care Comments    Other Self-Care Comments   initial HEP  Access Code: [email or web address]              PT Education - 02/27/19 1742    Education Details  Access Code: [email or web address]    Person(s) Educated  Patient    Methods  Explanation;Demonstration;Verbal cues;Handout    Comprehension  Verbalized understanding;Returned demonstration       PT Short Term Goals - 02/27/19 1746      PT SHORT TERM GOAL #1   Title  ind with initial HEP    Time  4    Period  Weeks    Status  New    Target Date  03/27/19        PT Long Term Goals - 02/27/19 1746      PT LONG TERM GOAL #1   Title  ind with advanced HEP    Time  8    Period  Weeks    Status  New    Target Date  04/24/19      PT LONG TERM GOAL #2   Title  understand correct posture with daily tasks to reduce strain on the cervical spine and reduce pain    Time  8    Period  Weeks    Status  New    Target Date  04/24/19      PT LONG TERM GOAL #3   Title  Pt will report at least 60% improvement of pain    Time  8    Period  Weeks    Status  New    Target Date  04/24/19      PT LONG TERM GOAL #4   Title  Pt will report no tingling in hands during typical daily activities    Time  8    Period  Weeks    Status  New  Target Date  04/24/19             Plan - 02/27/19 1058    Clinical Impression Statement  Pt presents to clinic with pain in neck and shoulder, tingling in hands bilaterally.  Pt reports this began about 1 month ago.  She demonstrates posture abnormalities as mentioned above.  Pt has decreased cervical ROM and Rt shoulder flexion ROM.  Pt has shoulder weakness.  She is had reduced symptoms after applying cervical traction and pain increased with Spuring's test.  Pt will benefit from skilled PT to address impairments including posture and strength for greater ability to perform functoinal tasks     Personal Factors and Comorbidities  Age;Comorbidity 1    Comorbidities  arthritis    Examination-Activity Limitations  Reach Overhead    Examination-Participation Restrictions  Other   house work   Stability/Clinical Decision Making  Evolving/Moderate complexity    Clinical Decision Making  Moderate    Rehab Potential  Excellent    PT Frequency  2x / week    PT Duration  6 weeks    PT Treatment/Interventions  ADLs/Self Care Home Management;Cryotherapy;Electrical Stimulation;Moist Heat;Traction;Ultrasound;Therapeutic activities;Therapeutic exercise;Manual techniques;Patient/family education;Passive range of motion;Dry needling;Taping    PT Next Visit Plan  cervical traction, posture, gentle neck and shoulder strength and ROM    PT Home Exercise Plan  Access Code: [email or web address]    Consulted and Agree with Plan of Care  Patient       Patient will benefit from skilled therapeutic intervention in order to improve the following deficits and impairments:  Abnormal gait, Pain, Postural dysfunction, Increased muscle spasms, Decreased strength, Decreased range of motion  Visit Diagnosis: 1. Cervicalgia   2. Muscle weakness (generalized)   3. Abnormal posture        Problem List Patient Active Problem List   Diagnosis Date Noted  . Unilateral primary osteoarthritis, left knee 01/25/2018  . Unilateral primary osteoarthritis, right knee 01/25/2018  . Chronic pain of both knees 01/25/2018  . 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[place]) 12/22/2010    Jule Ser, PT 02/27/2019, 5:49 PM  [facility] [address], [place], [place], [address] Phone: [phone]   Fax:  [phone]  Name: Laurie [name] MRN: [id number] Date of Birth: 1932/07/26

## 2019-02-27 NOTE — Patient Instructions (Signed)
Access Code: [email or web address]  URL: [email or web address]  Date: 02/27/2019  Prepared by: Jari Favre   Exercises  Supine Chin Tuck - 10 reps - 1 sets - 2x daily - 7x weekly

## 2019-03-02 ENCOUNTER — Encounter: Payer: Self-pay | Admitting: Physical Therapy

## 2019-03-02 ENCOUNTER — Ambulatory Visit: Payer: Medicare Other | Admitting: Physical Therapy

## 2019-03-02 ENCOUNTER — Other Ambulatory Visit: Payer: Self-pay

## 2019-03-02 DIAGNOSIS — R293 Abnormal posture: Secondary | ICD-10-CM

## 2019-03-02 DIAGNOSIS — M6281 Muscle weakness (generalized): Secondary | ICD-10-CM

## 2019-03-02 DIAGNOSIS — M542 Cervicalgia: Secondary | ICD-10-CM | POA: Diagnosis not present

## 2019-03-02 NOTE — Therapy (Signed)
[facility] [address] [place], [place], [address] Phone: [phone]   Fax:  [phone]  Physical Therapy Treatment  Patient Details  Name: Laurie [name] MRN: [id number] Date of Birth: Jan 10, 1932 Referring Provider (PT): Pete Pelt, [place]   Encounter Date: 03/02/2019  PT End of Session - 03/02/19 1015    Visit Number  2    Date for PT Re-Evaluation  04/24/19    PT Start Time  1016    PT Stop Time  1058    PT Time Calculation (min)  42 min    Activity Tolerance  Patient tolerated treatment well    Behavior During Therapy  [facility] for tasks assessed/performed       Past Medical History:  Diagnosis Date  . Family history of breast cancer    mother  . Family history of colon cancer    father  . Glaucoma   . Osteopenia     Past Surgical History:  Procedure Laterality Date  . APPENDECTOMY    . CESAREAN SECTION     x6  . COLECTOMY    . ESOPHAGOGASTRODUODENOSCOPY  07/07/2012   Procedure: ESOPHAGOGASTRODUODENOSCOPY (EGD);  Surgeon: Arta Silence, MD;  Location: [facility] ENDOSCOPY;  Service: Endoscopy;  Laterality: N/A;  . LEFT COLECTOMY  04/30/2011   Sr Streck  . tubal cyst  04/30/2011   L fallopian tube cyst incidentally found    There were no vitals filed for this visit.  Subjective Assessment - 03/02/19 1038    Subjective  Pain is in the shoulder today.  The exercise felt good.    Patient Stated Goals  be able to do things around the house, get rid of pain and tingling    Pain Score  8     Pain Location  Neck    Pain Orientation  Right    Pain Radiating Towards  down shoulder to elbow    Pain Onset  More than a month ago    Pain Frequency  Intermittent    Multiple Pain Sites  No                       [facility] Adult PT Treatment/Exercise - 03/02/19 0001      Exercises   Exercises  Neck;Shoulder      Neck Exercises: Supine   Neck Retraction  20 reps      Shoulder Exercises:  Sidelying   Flexion  Strengthening;Right;20 reps    ABduction  Strengthening;Left;20 reps    ABduction Limitations  horizontal abd - 20x Rt UE      Shoulder Exercises: Pulleys   Flexion  2 minutes      Shoulder Exercises: ROM/Strengthening   Other ROM/Strengthening Exercises  wall slide Rt UE - cues to keep posture reduced pain      Manual Therapy   Manual Therapy  Manual Traction;Soft tissue mobilization    Soft tissue mobilization  suboccipitals    Manual Traction  cervial             PT Education - 03/02/19 1048    Education Details  Access Code: [email or web address]    Person(s) Educated  Patient    Methods  Explanation;Demonstration;Handout    Comprehension  Verbalized understanding;Returned demonstration       PT Short Term Goals - 02/27/19 1746      PT SHORT TERM GOAL #1   Title  ind with initial HEP    Time  4  Period  Weeks    Status  New    Target Date  03/27/19        PT Long Term Goals - 02/27/19 1746      PT LONG TERM GOAL #1   Title  ind with advanced HEP    Time  8    Period  Weeks    Status  New    Target Date  04/24/19      PT LONG TERM GOAL #2   Title  understand correct posture with daily tasks to reduce strain on the cervical spine and reduce pain    Time  8    Period  Weeks    Status  New    Target Date  04/24/19      PT LONG TERM GOAL #3   Title  Pt will report at least 60% improvement of pain    Time  8    Period  Weeks    Status  New    Target Date  04/24/19      PT LONG TERM GOAL #4   Title  Pt will report no tingling in hands during typical daily activities    Time  8    Period  Weeks    Status  New    Target Date  04/24/19            Plan - 03/02/19 1058    Clinical Impression Statement  Pt is doing well with initial HEP.  Pt had some pain into arm when supine during STM.  Pain was reduced to zero in side lying.  She had no pain with exercises.  She needs cues for posture and reduce shoulder elevation.  Pt will benefit  fom skilled PT to continue to progress strength and posture with overhead movements.    PT Treatment/Interventions  ADLs/Self Care Home Management;Cryotherapy;Electrical Stimulation;Moist Heat;Traction;Ultrasound;Therapeutic activities;Therapeutic exercise;Manual techniques;Patient/family education;Passive range of motion;Dry needling;Taping    PT Next Visit Plan  posture with overhead movements, AAROM and AROM cervical and UE strength and ROM    PT Home Exercise Plan  Access Code: [email or web address]    Consulted and Agree with Plan of Care  Patient       Patient will benefit from skilled therapeutic intervention in order to improve the following deficits and impairments:  Abnormal gait, Pain, Postural dysfunction, Increased muscle spasms, Decreased strength, Decreased range of motion  Visit Diagnosis: 1. Cervicalgia   2. Muscle weakness (generalized)   3. Abnormal posture        Problem List Patient Active Problem List   Diagnosis Date Noted  . Unilateral primary osteoarthritis, left knee 01/25/2018  . Unilateral primary osteoarthritis, right knee 01/25/2018  . Chronic pain of both knees 01/25/2018  . 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[place]) 12/22/2010    Jule Ser, PT 03/02/2019, 11:01 AM  [facility] [address], [place], [place], [address] Phone: [phone]   Fax:  [phone]  Name: Laurie [name] MRN: [id number] Date of Birth: 17-Dec-1931

## 2019-03-02 NOTE — Patient Instructions (Signed)
Access Code: [email or web address]  URL: [email or web address]  Date: 03/02/2019  Prepared by: Jari Favre   Exercises  Supine Chin Tuck - 10 reps - 1 sets - 2x daily - 7x weekly  Sidelying Shoulder Abduction - 10 reps - 2 sets - 1x daily - 7x weekly  Sidelying Shoulder Horizontal Abduction - 10 reps - 2 sets - 1x daily - 7x weekly  Sidelying Shoulder Flexion 15 Degrees - 10 reps - 2 sets - 1x daily - 7x weekly

## 2019-03-06 ENCOUNTER — Ambulatory Visit: Payer: Medicare Other | Admitting: Physical Therapy

## 2019-03-06 ENCOUNTER — Other Ambulatory Visit: Payer: Self-pay

## 2019-03-06 ENCOUNTER — Encounter: Payer: Self-pay | Admitting: Physical Therapy

## 2019-03-06 DIAGNOSIS — R293 Abnormal posture: Secondary | ICD-10-CM

## 2019-03-06 DIAGNOSIS — M542 Cervicalgia: Secondary | ICD-10-CM

## 2019-03-06 DIAGNOSIS — M6281 Muscle weakness (generalized): Secondary | ICD-10-CM

## 2019-03-06 NOTE — Therapy (Signed)
[facility] [address] [place], [place], [address] Phone: [phone]   Fax:  [phone]  Physical Therapy Treatment  Patient Details  Name: Laurie [name] MRN: [id number] Date of Birth: [birth year] Referring Provider (PT): Pete Pelt, [place]   Encounter Date: 03/06/2019  PT End of Session - 03/06/19 1019    Visit Number  3    Date for PT Re-Evaluation  04/24/19    PT Start Time  1018    PT Stop Time  1058    PT Time Calculation (min)  40 min    Activity Tolerance  Patient tolerated treatment well    Behavior During Therapy  [facility] for tasks assessed/performed       Past Medical History:  Diagnosis Date  . Family history of breast cancer    mother  . Family history of colon cancer    father  . Glaucoma   . Osteopenia     Past Surgical History:  Procedure Laterality Date  . APPENDECTOMY    . CESAREAN SECTION     x6  . COLECTOMY    . ESOPHAGOGASTRODUODENOSCOPY  07/07/2012   Procedure: ESOPHAGOGASTRODUODENOSCOPY (EGD);  Surgeon: Arta Silence, MD;  Location: [facility] ENDOSCOPY;  Service: Endoscopy;  Laterality: N/A;  . LEFT COLECTOMY  04/30/2011   Sr Streck  . tubal cyst  04/30/2011   L fallopian tube cyst incidentally found    There were no vitals filed for this visit.  Subjective Assessment - 03/06/19 1020    Subjective  Pt states she was in a lot of pain yesterday in her shoulder and then her neck hurt later. I used some head and it felt better    Patient Stated Goals  be able to do things around the house, get rid of pain and tingling    Currently in Pain?  Yes    Pain Score  4     Pain Location  Neck    Pain Orientation  Right;Left    Pain Descriptors / Indicators  Tingling;Pins and needles    Pain Radiating Towards  in the fingers    Pain Onset  More than a month ago    Pain Frequency  Intermittent    Aggravating Factors   lifting the arm    Pain Relieving Factors  heat    Multiple Pain  Sites  No                       [facility] Adult PT Treatment/Exercise - 03/06/19 0001      Shoulder Exercises: Standing   Flexion  Right;Strengthening;Left;5 reps   finger ladder with cues to relax shoulder   Extension  Strengthening;Both;15 reps;Theraband    Theraband Level (Shoulder Extension)  Level 1 (Yellow)    Row  Strengthening;Both;20 reps;Theraband    Theraband Level (Shoulder Row)  Level 2 (Red)      Shoulder Exercises: Pulleys   Flexion  3 minutes    ABduction  2 minutes      Manual Therapy   Soft tissue mobilization  suboccipitals, SCM, upper traps    Manual Traction  cervial               PT Short Term Goals - 03/06/19 1100      PT SHORT TERM GOAL #1   Title  ind with initial HEP    Status  Achieved        PT Long Term Goals - 02/27/19 1746  PT LONG TERM GOAL #1   Title  ind with advanced HEP    Time  8    Period  Weeks    Status  New    Target Date  04/24/19      PT LONG TERM GOAL #2   Title  understand correct posture with daily tasks to reduce strain on the cervical spine and reduce pain    Time  8    Period  Weeks    Status  New    Target Date  04/24/19      PT LONG TERM GOAL #3   Title  Pt will report at least 60% improvement of pain    Time  8    Period  Weeks    Status  New    Target Date  04/24/19      PT LONG TERM GOAL #4   Title  Pt will report no tingling in hands during typical daily activities    Time  8    Period  Weeks    Status  New    Target Date  04/24/19            Plan - 03/06/19 1058    Clinical Impression Statement  Pt did well with theraband progressions.  She has relief with upper trap trigger point release on right side today.  Overall, patient is progressing and able to perform initial HEP correctly.  Continue with POC.    PT Treatment/Interventions  ADLs/Self Care Home Management;Cryotherapy;Electrical Stimulation;Moist Heat;Traction;Ultrasound;Therapeutic activities;Therapeutic  exercise;Manual techniques;Patient/family education;Passive range of motion;Dry needling;Taping    PT Next Visit Plan  posture with overhead movements, AAROM and AROM cervical and UE strength and ROM    PT Home Exercise Plan  Access Code: [email or web address]    Consulted and Agree with Plan of Care  Patient       Patient will benefit from skilled therapeutic intervention in order to improve the following deficits and impairments:  Abnormal gait, Pain, Postural dysfunction, Increased muscle spasms, Decreased strength, Decreased range of motion  Visit Diagnosis: 1. Cervicalgia   2. Muscle weakness (generalized)   3. Abnormal posture        Problem List Patient Active Problem List   Diagnosis Date Noted  . Unilateral primary osteoarthritis, left knee 01/25/2018  . Unilateral primary osteoarthritis, right knee 01/25/2018  . Chronic pain of both knees 01/25/2018  . 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[place]) 12/22/2010    Jule Ser, PT 03/06/2019, 11:01 AM  [facility] [address] [place], [place], [address] Phone: [phone]   Fax:  [phone]  Name: JERMANI PUND MRN: [id number] Date of Birth: [birth year]

## 2019-03-10 ENCOUNTER — Other Ambulatory Visit: Payer: Self-pay

## 2019-03-10 ENCOUNTER — Ambulatory Visit: Payer: Medicare Other | Admitting: Physical Therapy

## 2019-03-10 ENCOUNTER — Encounter: Payer: Self-pay | Admitting: Physical Therapy

## 2019-03-10 DIAGNOSIS — M542 Cervicalgia: Secondary | ICD-10-CM

## 2019-03-10 DIAGNOSIS — M6281 Muscle weakness (generalized): Secondary | ICD-10-CM

## 2019-03-10 DIAGNOSIS — R293 Abnormal posture: Secondary | ICD-10-CM

## 2019-03-10 NOTE — Therapy (Signed)
[facility] [address], [place], [place], [address] Phone: [phone]   Fax:  [phone]  Physical Therapy Treatment  Patient Details  Name: Laurie [name] MRN: [id number] Date of Birth: Jun 10, 1932 Referring Provider (PT): Pete Pelt, [place]   Encounter Date: 03/10/2019  PT End of Session - 03/10/19 1027    Visit Number  4    Date for PT Re-Evaluation  04/24/19    PT Start Time  1028    PT Stop Time  1108    PT Time Calculation (min)  40 min    Activity Tolerance  Patient tolerated treatment well       Past Medical History:  Diagnosis Date  . Family history of breast cancer    mother  . Family history of colon cancer    father  . Glaucoma   . Osteopenia     Past Surgical History:  Procedure Laterality Date  . APPENDECTOMY    . CESAREAN SECTION     x6  . COLECTOMY    . ESOPHAGOGASTRODUODENOSCOPY  07/07/2012   Procedure: ESOPHAGOGASTRODUODENOSCOPY (EGD);  Surgeon: Arta Silence, MD;  Location: [facility] ENDOSCOPY;  Service: Endoscopy;  Laterality: N/A;  . LEFT COLECTOMY  04/30/2011   Sr Streck  . tubal cyst  04/30/2011   L fallopian tube cyst incidentally found    There were no vitals filed for this visit.  Subjective Assessment - 03/10/19 1133    Subjective  Pt states she was in a lot of pain yesterday and 52/84 pain with certain movements today in her shoulder and up to back of the head.  Pt states she almost went to the ER and doesn't know why it hurts so much.    Patient Stated Goals  be able to do things around the house, get rid of pain and tingling    Currently in Pain?  Yes    Pain Score  10-Worst pain ever    Pain Location  Neck    Pain Orientation  Mid;Right;Upper    Pain Descriptors / Indicators  Sharp    Pain Type  Chronic pain    Pain Radiating Towards  into lateral shoulder up to back of the head    Aggravating Factors   looking down    Multiple Pain Sites  No                        [facility] Adult PT Treatment/Exercise - 03/10/19 0001      Self-Care   Other Self-Care Comments   educated on posture correction brace and pulleys; reviewed and performed posture with scap squeeze added to HEP      Manual Therapy   Soft tissue mobilization  suboccipitals, cervical paraspinals, SCM, upper traps    Manual Traction  cervial             PT Education - 03/10/19 1117    Education Details  educated on posture corrector and pulleys for additions to HEP; scap squeezes added to [employer]) Educated  Patient    Methods  Explanation;Demonstration;Handout;Verbal cues    Comprehension  Verbalized understanding;Returned demonstration       PT Short Term Goals - 03/06/19 1100      PT SHORT TERM GOAL #1   Title  ind with initial HEP    Status  Achieved        PT Long Term Goals - 03/10/19 1126  PT LONG TERM GOAL #1   Title  ind with advanced HEP    Status  On-going      PT LONG TERM GOAL #2   Title  understand correct posture with daily tasks to reduce strain on the cervical spine and reduce pain    Baseline  improving    Status  Partially Met      PT LONG TERM GOAL #3   Title  Pt will report at least 60% improvement of pain    Status  On-going      PT LONG TERM GOAL #4   Title  Pt will report no tingling in hands during typical daily activities    Status  On-going            Plan - 03/10/19 1118    Clinical Impression Statement  Pt responded well to treatment today with no pain at the end of treatment. She arrived in 10/10 pain. Pt had muscle spasms throughout suboccipitals and cervical paraspinals.  She felt better with scap squeezes and improved posture in sitting.  Pt will benefit from skilled PT to work on strength and improved posture    PT Treatment/Interventions  ADLs/Self Care Home Management;Cryotherapy;Electrical Stimulation;Moist Heat;Traction;Ultrasound;Therapeutic activities;Therapeutic  exercise;Manual techniques;Patient/family education;Passive range of motion;Dry needling;Taping    PT Next Visit Plan  posture with UE movements, throacic extension, AAROM and AROM cervical and UE strength and ROM, STM to cervical paraspinals and suboccipitals as needed    PT Home Exercise Plan  Access Code: [email or web address]    Consulted and Agree with Plan of Care  Patient       Patient will benefit from skilled therapeutic intervention in order to improve the following deficits and impairments:  Abnormal gait, Pain, Postural dysfunction, Increased muscle spasms, Decreased strength, Decreased range of motion  Visit Diagnosis: 1. Cervicalgia   2. Muscle weakness (generalized)   3. Abnormal posture        Problem List Patient Active Problem List   Diagnosis Date Noted  . Unilateral primary osteoarthritis, left knee 01/25/2018  . Unilateral primary osteoarthritis, right knee 01/25/2018  . Chronic pain of both knees 01/25/2018  . 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[place]) 12/22/2010    Jule Ser, PT 03/10/2019, 11:35 AM   [facility] [address], [place], [place], [address] Phone: [phone]   Fax:  [phone]  Name: Laurie [name] MRN: [id number] Date of Birth: Apr 14, 1932

## 2019-03-10 NOTE — Patient Instructions (Addendum)
   [facility] [address], [address] [place], [place] [address] Phone # [phone] Fax [phone]  

## 2019-03-13 ENCOUNTER — Encounter: Payer: Self-pay | Admitting: Physical Therapy

## 2019-03-13 ENCOUNTER — Ambulatory Visit: Payer: Medicare Other | Admitting: Physical Therapy

## 2019-03-13 ENCOUNTER — Other Ambulatory Visit: Payer: Self-pay

## 2019-03-13 DIAGNOSIS — R293 Abnormal posture: Secondary | ICD-10-CM

## 2019-03-13 DIAGNOSIS — M542 Cervicalgia: Secondary | ICD-10-CM

## 2019-03-13 DIAGNOSIS — M6281 Muscle weakness (generalized): Secondary | ICD-10-CM

## 2019-03-13 NOTE — Therapy (Addendum)
[facility] [address] [place], [place], [address] Phone: [phone]   Fax:  [phone]  Physical Therapy Treatment  Patient Details  Name: Laurie [name] MRN: [id number] Date of Birth: Sep 29, 1931 Referring Provider (PT): Pete Pelt, [place]   Encounter Date: 03/13/2019  PT End of Session - 03/13/19 1017    Visit Number  5    Date for PT Re-Evaluation  04/24/19    PT Start Time  1017    PT Stop Time  1057    PT Time Calculation (min)  40 min    Activity Tolerance  Patient tolerated treatment well    Behavior During Therapy  [facility] for tasks assessed/performed       Past Medical History:  Diagnosis Date  . Family history of breast cancer    mother  . Family history of colon cancer    father  . Glaucoma   . Osteopenia     Past Surgical History:  Procedure Laterality Date  . APPENDECTOMY    . CESAREAN SECTION     x6  . COLECTOMY    . ESOPHAGOGASTRODUODENOSCOPY  07/07/2012   Procedure: ESOPHAGOGASTRODUODENOSCOPY (EGD);  Surgeon: Arta Silence, MD;  Location: [facility] ENDOSCOPY;  Service: Endoscopy;  Laterality: N/A;  . LEFT COLECTOMY  04/30/2011   Sr Streck  . tubal cyst  04/30/2011   L fallopian tube cyst incidentally found    There were no vitals filed for this visit.  Subjective Assessment - 03/13/19 1050    Subjective  Pt states she felt good after previous treatment but the pain is back this morning.  States it is okay when not moving.    Currently in Pain?  Yes    Pain Score  9     Pain Location  Neck    Pain Orientation  Right;Upper    Pain Descriptors / Indicators  Sharp    Pain Type  Chronic pain    Pain Radiating Towards  up into the head    Pain Onset  More than a month ago    Pain Frequency  Intermittent    Aggravating Factors   looking down    Pain Relieving Factors  heat    Multiple Pain Sites  No                       [facility] Adult PT Treatment/Exercise -  03/13/19 0001      Neck Exercises: Seated   Other Seated Exercise  cervical rotation - 10x each way      Neck Exercises: Supine   Cervical Rotation  Left;Both;10 reps      Shoulder Exercises: Supine   External Rotation  Strengthening;Both;20 reps;Theraband    Theraband Level (Shoulder External Rotation)  Level 1 (Yellow)      Modalities   Modalities  Moist Heat      Moist Heat Therapy   Number Minutes Moist Heat  10 Minutes    Moist Heat Location  Cervical   during supine ex     Manual Therapy   Soft tissue mobilization  suboccipitals, cervical paraspinals, SCM, upper traps    Manual Traction  cervial             PT Education - 03/13/19 1142    Education Details  Access Code: [email or web address]    Person(s) Educated  Patient    Methods  Explanation;Demonstration;Handout;Verbal cues    Comprehension  Verbalized understanding;Returned demonstration  PT Short Term Goals - 03/06/19 1100      PT SHORT TERM GOAL #1   Title  ind with initial HEP    Status  Achieved        PT Long Term Goals - 03/10/19 1126      PT LONG TERM GOAL #1   Title  ind with advanced HEP    Status  On-going      PT LONG TERM GOAL #2   Title  understand correct posture with daily tasks to reduce strain on the cervical spine and reduce pain    Baseline  improving    Status  Partially Met      PT LONG TERM GOAL #3   Title  Pt will report at least 60% improvement of pain    Status  On-going      PT LONG TERM GOAL #4   Title  Pt will report no tingling in hands during typical daily activities    Status  On-going            Plan - 03/13/19 1054    Clinical Impression Statement  Pt was able to rotate her head better without pain at the end of the session.  She was able to understand the cause of her pain a little better at the end of the session and it being due partly to muscle tension.  She was educated in adding cervical rotion to her HEP. Pt will benefit fom skilled PT to continue  to work on improved ROM for return to maximum function.    PT Treatment/Interventions  ADLs/Self Care Home Management;Cryotherapy;Electrical Stimulation;Moist Heat;Traction;Ultrasound;Therapeutic activities;Therapeutic exercise;Manual techniques;Patient/family education;Passive range of motion;Dry needling;Taping    PT Next Visit Plan  posture with UE movements, throacic extension, AAROM and AROM cervical and UE strength and ROM, STM to cervical paraspinals and suboccipitals as needed    PT Home Exercise Plan  Access Code: [email or web address]    Consulted and Agree with Plan of Care  Patient       Patient will benefit from skilled therapeutic intervention in order to improve the following deficits and impairments:  Abnormal gait, Pain, Postural dysfunction, Increased muscle spasms, Decreased strength, Decreased range of motion  Visit Diagnosis: 1. Cervicalgia   2. Muscle weakness (generalized)   3. Abnormal posture        Problem List Patient Active Problem List   Diagnosis Date Noted  . Unilateral primary osteoarthritis, left knee 01/25/2018  . Unilateral primary osteoarthritis, right knee 01/25/2018  . Chronic pain of both knees 01/25/2018  . 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[place]) 12/22/2010    Jule Ser, PT 03/13/2019, 11:42 AM  [facility] [address] [place], [place], [address] Phone: [phone]   Fax:  [phone]  Name: Laurie [name] MRN: [id number] Date of Birth: 09/30/1931  PHYSICAL THERAPY DISCHARGE SUMMARY  Visits from Start of Care: 5  Current functional level related to goals / functional outcomes: See above goals   Remaining deficits: See above   Education / Equipment: HEP  Plan: Patient agrees to discharge.  Patient goals were not met. Patient is being discharged due to not returning since the last visit.  ?????    [employer], PT 04/24/19 9:12 AM

## 2019-03-16 ENCOUNTER — Ambulatory Visit: Payer: Medicare Other | Admitting: Physical Therapy

## 2019-03-20 ENCOUNTER — Ambulatory Visit: Payer: Medicare Other | Admitting: Orthopaedic Surgery

## 2019-03-21 ENCOUNTER — Encounter: Payer: Medicare Other | Admitting: Physical Therapy

## 2019-03-23 ENCOUNTER — Ambulatory Visit: Payer: Medicare Other | Admitting: Physical Therapy

## 2019-03-27 ENCOUNTER — Ambulatory Visit: Payer: Medicare Other | Admitting: Physical Therapy

## 2019-03-30 ENCOUNTER — Ambulatory Visit: Payer: Medicare Other | Admitting: Physical Therapy

## 2019-04-04 ENCOUNTER — Encounter: Payer: Medicare Other | Admitting: Physical Therapy

## 2019-04-06 ENCOUNTER — Encounter: Payer: Medicare Other | Admitting: Physical Therapy

## 2019-04-27 ENCOUNTER — Other Ambulatory Visit: Payer: Self-pay | Admitting: Family Medicine

## 2019-04-27 DIAGNOSIS — Z1231 Encounter for screening mammogram for malignant neoplasm of breast: Secondary | ICD-10-CM

## 2019-04-28 ENCOUNTER — Other Ambulatory Visit: Payer: Self-pay

## 2019-04-28 ENCOUNTER — Ambulatory Visit
Admission: RE | Admit: 2019-04-28 | Discharge: 2019-04-28 | Disposition: A | Discharge status: Home / Self Care | Payer: Medicare Other | Source: Ambulatory Visit | Attending: Family Medicine | Admitting: Family Medicine

## 2019-04-28 DIAGNOSIS — Z1231 Encounter for screening mammogram for malignant neoplasm of breast: Secondary | ICD-10-CM

## 2019-07-10 ENCOUNTER — Ambulatory Visit: Payer: Medicare Other | Admitting: Physician Assistant

## 2019-07-10 ENCOUNTER — Ambulatory Visit: Payer: Self-pay

## 2019-07-10 ENCOUNTER — Other Ambulatory Visit: Payer: Self-pay

## 2019-07-10 ENCOUNTER — Encounter: Payer: Self-pay | Admitting: Physician Assistant

## 2019-07-10 DIAGNOSIS — M25561 Pain in right knee: Secondary | ICD-10-CM | POA: Diagnosis not present

## 2019-07-10 DIAGNOSIS — M1711 Unilateral primary osteoarthritis, right knee: Secondary | ICD-10-CM

## 2019-07-10 MED ORDER — METHYLPREDNISOLONE ACETATE 40 MG/ML IJ SUSP
40.0000 mg | INTRAMUSCULAR | Status: AC | PRN
  Administered 2019-07-10: 10:00:00 40 mg via INTRA_ARTICULAR

## 2019-07-10 MED ORDER — LIDOCAINE HCL 1 % IJ SOLN
3.0000 mL | INTRAMUSCULAR | Status: AC | PRN
  Administered 2019-07-10: 10:00:00 3 mL

## 2019-07-10 NOTE — Progress Notes (Signed)
Office Visit Note   Patient: Laurie [name]           Date of Birth: 23-Sep-1931           MRN: [id number] Visit Date: 07/10/2019              Requested by: Leighton Ruff, MD [place],  [place] [address] PCP: Leighton Ruff, MD   Assessment & Plan: Visit Diagnoses:  1. Right knee pain, unspecified chronicity   2. Unilateral primary osteoarthritis, right knee     Plan:  She understands she needs to wait at least 3 months between injections in the knee.  Follow-up as needed.  Follow-Up Instructions: Return if symptoms worsen or fail to improve.   Orders:  Orders Placed This Encounter  Procedures  . Large Joint Inj: R knee  . XR Knee 1-2 Views Right   No orders of the defined types were placed in this encounter.     Procedures: Large Joint Inj: R knee on 07/10/2019 10:11 AM Indications: pain Details: 22 G 1.5 in needle, anterolateral approach  Arthrogram: No  Medications: 3 mL lidocaine 1 %; 40 mg methylPREDNISolone acetate 40 MG/ML Outcome: tolerated well, no immediate complications Procedure, treatment alternatives, risks and benefits explained, specific risks discussed. Consent was given by the patient. Immediately prior to procedure a time out was called to verify the correct patient, procedure, equipment, support staff and site/side marked as required. Patient was prepped and draped in the usual sterile fashion.       Clinical Data: No additional findings.   Subjective: Chief Complaint  Patient presents with  . Right Knee - Pain    HPI Laurie [name] comes in today due to right knee pain.  Started this past Friday.  No injury.  She has pain when ambulating.  She has pain especially with the first steps.  She last had a cortisone injection in the right knee 12 of 2019.  She denies any mechanical symptoms to the knee.  She is taken some Tylenol which helps some.  She has known arthritis of the right knee. Review of Systems See HPI  otherwise negative or noncontributory  Objective: Vital Signs: There were no vitals taken for this visit.  Physical Exam Constitutional:      Appearance: She is not ill-appearing or diaphoretic.  Pulmonary:     Effort: Pulmonary effort is normal.  Neurological:     Mental Status: She is alert and oriented to person, place, and time.  Psychiatric:        Mood and Affect: Mood normal.     Ortho Exam Right knee good range of motion patellofemoral crepitus present.  Tenderness along the lateral joint line.  No instability no abnormal warmth erythema or effusion. Specialty Comments:  No specialty comments available.  Imaging: XR Knee 1-2 Views Right  Result Date: 07/10/2019 Right knee AP/lateral views: No acute fracture.  Tricompartmental changes with moderate medial compartmental changes and severe patellofemoral changes.    PMFS History: Patient Active Problem List   Diagnosis Date Noted  . Unilateral primary osteoarthritis, left knee 01/25/2018  . Unilateral primary osteoarthritis, right knee 01/25/2018  . Chronic pain of both knees 01/25/2018  . 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[place]) 12/22/2010   Past Medical History:  Diagnosis Date  . Family history of breast cancer    mother  . Family history of colon cancer    father  . Glaucoma   . Osteopenia     Family History  Problem Relation Age of Onset  . Breast cancer Mother   . Colon cancer Father   . Diabetes Sister     Past Surgical History:  Procedure Laterality Date  . APPENDECTOMY    . CESAREAN SECTION     x6  . COLECTOMY    . ESOPHAGOGASTRODUODENOSCOPY  07/07/2012   Procedure: ESOPHAGOGASTRODUODENOSCOPY (EGD);  Surgeon: Arta Silence, MD;  Location: [facility] ENDOSCOPY;  Service: Endoscopy;  Laterality: N/A;  . LEFT COLECTOMY  04/30/2011   Sr Streck  . tubal cyst  04/30/2011   L fallopian tube cyst incidentally found   Social History   Occupational History  . Not on file  Tobacco Use  . Smoking status:  Former Smoker    Packs/day: 0.50  Substance and Sexual Activity  . Alcohol use: No    Comment: rare  . Drug use: No  . Sexual activity: Not on file

## 2019-08-15 ENCOUNTER — Ambulatory Visit: Payer: Medicare Other | Attending: Internal Medicine

## 2019-08-15 DIAGNOSIS — Z23 Encounter for immunization: Secondary | ICD-10-CM

## 2019-08-15 NOTE — Progress Notes (Signed)
   Covid-19 Vaccination Clinic  Name:  Laurie [name]    MRN: [id number] DOB: 06-10-1932  08/15/2019  Laurie [name] was observed post Covid-19 immunization for 15 minutes without incidence. She was provided with Vaccine Information Sheet and instruction to access the V-Safe system.   Laurie [name] was instructed to call 911 with any severe reactions post vaccine: Marland Kitchen Difficulty breathing  . Swelling of your face and throat  . A fast heartbeat  . A bad rash all over your body  . Dizziness and weakness    Immunizations Administered    Name Date Dose VIS Date Route   Pfizer COVID-19 Vaccine 08/15/2019  5:41 PM 0.3 mL 07/07/2019 Intramuscular   Manufacturer: [place]   Lot: [id number]   [place]: [id number]

## 2019-09-02 ENCOUNTER — Ambulatory Visit: Payer: Medicare Other | Attending: Internal Medicine

## 2019-09-02 DIAGNOSIS — Z23 Encounter for immunization: Secondary | ICD-10-CM | POA: Insufficient documentation

## 2019-09-02 NOTE — Progress Notes (Signed)
   Covid-19 Vaccination Clinic  Name:  Laurie [name]    MRN: [id number] DOB: 07/28/1931  09/02/2019  [name] was observed post Covid-19 immunization for 15 minutes without incidence. She was provided with Vaccine Information Sheet and instruction to access the V-Safe system.   [name] was instructed to call 911 with any severe reactions post vaccine: Marland Kitchen Difficulty breathing  . Swelling of your face and throat  . A fast heartbeat  . A bad rash all over your body  . Dizziness and weakness    Immunizations Administered    Name Date Dose VIS Date Route   Pfizer COVID-19 Vaccine 09/02/2019  1:10 PM 0.3 mL 07/07/2019 Intramuscular   Manufacturer: [place]   Lot: [id number]   [place]: [id number]

## 2020-01-15 ENCOUNTER — Encounter: Payer: Self-pay | Admitting: Orthopaedic Surgery

## 2020-01-15 ENCOUNTER — Ambulatory Visit: Payer: Self-pay

## 2020-01-15 ENCOUNTER — Ambulatory Visit: Payer: Medicare Other | Admitting: Orthopaedic Surgery

## 2020-01-15 ENCOUNTER — Other Ambulatory Visit: Payer: Self-pay

## 2020-01-15 DIAGNOSIS — M542 Cervicalgia: Secondary | ICD-10-CM

## 2020-01-15 DIAGNOSIS — M1711 Unilateral primary osteoarthritis, right knee: Secondary | ICD-10-CM

## 2020-01-15 DIAGNOSIS — G8929 Other chronic pain: Secondary | ICD-10-CM | POA: Diagnosis not present

## 2020-01-15 DIAGNOSIS — M25561 Pain in right knee: Secondary | ICD-10-CM

## 2020-01-15 MED ORDER — METHYLPREDNISOLONE ACETATE 40 MG/ML IJ SUSP
40.0000 mg | INTRAMUSCULAR | Status: AC | PRN
  Administered 2020-01-15: 40 mg via INTRA_ARTICULAR

## 2020-01-15 MED ORDER — LIDOCAINE HCL 1 % IJ SOLN
3.0000 mL | INTRAMUSCULAR | Status: AC | PRN
  Administered 2020-01-15: 3 mL

## 2020-01-15 NOTE — Progress Notes (Signed)
Office Visit Note   Patient: Laurie [name]           Date of Birth: [birth year]           MRN: [id number] Visit Date: 01/15/2020              Requested by: Leighton Ruff, MD [place],  [place] [address] PCP: Leighton Ruff, MD   Assessment & Plan: Visit Diagnoses:  1. Chronic pain of right knee   2. Neck pain   3. Unilateral primary osteoarthritis, right knee     Plan: Per her request I did place a steroid injection in her right knee.  I also recommended Voltaren gel for both her knees and her cervical spine.  She will work on posture and will avoid reading for a while which affects neck posture.  All questions and concerns were answered and addressed.  The other thing we have recommended for her knee if all else fails will be a knee replacement.  Follow-Up Instructions: Return if symptoms worsen or fail to improve.   Orders:  Orders Placed This Encounter  Procedures  . Large Joint Inj  . XR Cervical Spine 2 or 3 views  . XR Knee 1-2 Views Right   No orders of the defined types were placed in this encounter.     Procedures: Large Joint Inj: R knee on 01/15/2020 10:07 AM Indications: diagnostic evaluation and pain Details: 22 G 1.5 in needle, superolateral approach  Arthrogram: No  Medications: 3 mL lidocaine 1 %; 40 mg methylPREDNISolone acetate 40 MG/ML Outcome: tolerated well, no immediate complications Procedure, treatment alternatives, risks and benefits explained, specific risks discussed. Consent was given by the patient. Immediately prior to procedure a time out was called to verify the correct patient, procedure, equipment, support staff and site/side marked as required. Patient was prepped and draped in the usual sterile fashion.       Clinical Data: No additional findings.   Subjective: Chief Complaint  Patient presents with  . Right Knee - Pain  The patient is an active 86 year old female who comes in today with neck pain  as well as right knee pain.  We have seen her before before her knees.  She has known osteoarthritis in her knees.  She is requesting a steroid injection in her right knee today because she is going to [place] next month.  She has had a steroid injection in her knees before and it does help.  The last time she had one was in December of last year.  She has been doing a lot of reading and now has had having neck pain in the mid neck and goes up into her head.  It does not radiate down her arms.  She says it happens more after she has been reading.  She did try therapy but states that that did not help her.  She denies any numbness and tingling in her hands or feet.  She denies any other acute changes in her medical status.  HPI  Review of Systems She currently denies any headache, chest pain, shortness of breath, fever, chills, nausea, vomiting  Objective: Vital Signs: There were no vitals taken for this visit.  Physical Exam She is alert and orient x3 and in no acute distress.  She is slow to mobilize. Ortho Exam Examination of her neck shows midline tenderness but no blocks to range of motion.  It is just stiff and has some pain throughout its  arc of motion.  She has good strength in her bilateral upper extremities and normal sensation.  Examination of both knees show stiffness with patellofemoral crepitation and slight varus malalignment.  Her right knee has more medial joint line tenderness. Specialty Comments:  No specialty comments available.  Imaging: XR Cervical Spine 2 or 3 views  Result Date: 01/15/2020 2 views of the cervical spine showed no acute findings.  There is no malalignment.  There is arthritic changes of the facet joints and slight degenerative disc disease at several levels.  XR Knee 1-2 Views Right  Result Date: 01/15/2020 2 views of the right knee show no acute findings.  There is tricompartmental arthritis with medial joint space narrowing as well as severe  patellofemoral disease.  There are osteophytes in all 3 compartments.    PMFS History: Patient Active Problem List   Diagnosis Date Noted  . Chronic pain of right knee 01/15/2020  . Unilateral primary osteoarthritis, left knee 01/25/2018  . Unilateral primary osteoarthritis, right knee 01/25/2018  . Chronic pain of both knees 01/25/2018  . 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[place]) 12/22/2010   Past Medical History:  Diagnosis Date  . Family history of breast cancer    mother  . Family history of colon cancer    father  . Glaucoma   . Osteopenia     Family History  Problem Relation Age of Onset  . Breast cancer Mother   . Colon cancer Father   . Diabetes Sister     Past Surgical History:  Procedure Laterality Date  . APPENDECTOMY    . CESAREAN SECTION     x6  . COLECTOMY    . ESOPHAGOGASTRODUODENOSCOPY  07/07/2012   Procedure: ESOPHAGOGASTRODUODENOSCOPY (EGD);  Surgeon: Arta Silence, MD;  Location: [facility] ENDOSCOPY;  Service: Endoscopy;  Laterality: N/A;  . LEFT COLECTOMY  04/30/2011   Sr Streck  . tubal cyst  04/30/2011   L fallopian tube cyst incidentally found   Social History   Occupational History  . Not on file  Tobacco Use  . Smoking status: Former Smoker    Packs/day: 0.50  Substance and Sexual Activity  . Alcohol use: No    Comment: rare  . Drug use: No  . Sexual activity: Not on file

## 2020-04-16 ENCOUNTER — Other Ambulatory Visit: Payer: Self-pay | Admitting: Family Medicine

## 2020-04-16 DIAGNOSIS — Z1231 Encounter for screening mammogram for malignant neoplasm of breast: Secondary | ICD-10-CM

## 2020-05-03 ENCOUNTER — Other Ambulatory Visit: Payer: Self-pay

## 2020-05-03 ENCOUNTER — Ambulatory Visit
Admission: RE | Admit: 2020-05-03 | Discharge: 2020-05-03 | Disposition: A | Discharge status: Home / Self Care | Payer: Medicare Other | Source: Ambulatory Visit | Attending: Family Medicine | Admitting: Family Medicine

## 2020-05-03 DIAGNOSIS — Z1231 Encounter for screening mammogram for malignant neoplasm of breast: Secondary | ICD-10-CM

## 2020-09-17 DIAGNOSIS — H348312 Tributary (branch) retinal vein occlusion, right eye, stable: Secondary | ICD-10-CM | POA: Diagnosis not present

## 2020-09-17 DIAGNOSIS — H04123 Dry eye syndrome of bilateral lacrimal glands: Secondary | ICD-10-CM | POA: Diagnosis not present

## 2020-09-17 DIAGNOSIS — H401133 Primary open-angle glaucoma, bilateral, severe stage: Secondary | ICD-10-CM | POA: Diagnosis not present

## 2020-10-09 DIAGNOSIS — H348312 Tributary (branch) retinal vein occlusion, right eye, stable: Secondary | ICD-10-CM | POA: Diagnosis not present

## 2020-10-09 DIAGNOSIS — H35372 Puckering of macula, left eye: Secondary | ICD-10-CM | POA: Diagnosis not present

## 2020-10-09 DIAGNOSIS — H401130 Primary open-angle glaucoma, bilateral, stage unspecified: Secondary | ICD-10-CM | POA: Diagnosis not present

## 2020-10-09 DIAGNOSIS — H353132 Nonexudative age-related macular degeneration, bilateral, intermediate dry stage: Secondary | ICD-10-CM | POA: Diagnosis not present

## 2020-10-29 DIAGNOSIS — H353130 Nonexudative age-related macular degeneration, bilateral, stage unspecified: Secondary | ICD-10-CM | POA: Diagnosis not present

## 2020-10-29 DIAGNOSIS — H401133 Primary open-angle glaucoma, bilateral, severe stage: Secondary | ICD-10-CM | POA: Diagnosis not present

## 2020-10-29 DIAGNOSIS — H348312 Tributary (branch) retinal vein occlusion, right eye, stable: Secondary | ICD-10-CM | POA: Diagnosis not present

## 2020-10-29 DIAGNOSIS — H04123 Dry eye syndrome of bilateral lacrimal glands: Secondary | ICD-10-CM | POA: Diagnosis not present

## 2020-11-14 DIAGNOSIS — H401112 Primary open-angle glaucoma, right eye, moderate stage: Secondary | ICD-10-CM | POA: Diagnosis not present

## 2021-01-07 DIAGNOSIS — R52 Pain, unspecified: Secondary | ICD-10-CM | POA: Diagnosis not present

## 2021-01-07 DIAGNOSIS — R059 Cough, unspecified: Secondary | ICD-10-CM | POA: Diagnosis not present

## 2021-01-30 DIAGNOSIS — H401123 Primary open-angle glaucoma, left eye, severe stage: Secondary | ICD-10-CM | POA: Diagnosis not present

## 2021-04-24 DIAGNOSIS — H401133 Primary open-angle glaucoma, bilateral, severe stage: Secondary | ICD-10-CM | POA: Diagnosis not present

## 2021-04-24 DIAGNOSIS — H353131 Nonexudative age-related macular degeneration, bilateral, early dry stage: Secondary | ICD-10-CM | POA: Diagnosis not present

## 2021-05-28 DIAGNOSIS — R051 Acute cough: Secondary | ICD-10-CM | POA: Diagnosis not present

## 2021-05-28 DIAGNOSIS — Z03818 Encounter for observation for suspected exposure to other biological agents ruled out: Secondary | ICD-10-CM | POA: Diagnosis not present

## 2021-06-05 DIAGNOSIS — H401133 Primary open-angle glaucoma, bilateral, severe stage: Secondary | ICD-10-CM | POA: Diagnosis not present

## 2021-06-05 DIAGNOSIS — H353131 Nonexudative age-related macular degeneration, bilateral, early dry stage: Secondary | ICD-10-CM | POA: Diagnosis not present

## 2021-06-11 DIAGNOSIS — D225 Melanocytic nevi of trunk: Secondary | ICD-10-CM | POA: Diagnosis not present

## 2021-06-11 DIAGNOSIS — C44529 Squamous cell carcinoma of skin of other part of trunk: Secondary | ICD-10-CM | POA: Diagnosis not present

## 2021-06-16 ENCOUNTER — Other Ambulatory Visit: Payer: Self-pay | Admitting: Family Medicine

## 2021-06-16 DIAGNOSIS — Z1231 Encounter for screening mammogram for malignant neoplasm of breast: Secondary | ICD-10-CM

## 2021-07-02 DIAGNOSIS — L821 Other seborrheic keratosis: Secondary | ICD-10-CM | POA: Diagnosis not present

## 2021-07-02 DIAGNOSIS — Z85828 Personal history of other malignant neoplasm of skin: Secondary | ICD-10-CM | POA: Diagnosis not present

## 2021-07-02 DIAGNOSIS — L570 Actinic keratosis: Secondary | ICD-10-CM | POA: Diagnosis not present

## 2021-07-02 DIAGNOSIS — D044 Carcinoma in situ of skin of scalp and neck: Secondary | ICD-10-CM | POA: Diagnosis not present

## 2021-07-02 DIAGNOSIS — D485 Neoplasm of uncertain behavior of skin: Secondary | ICD-10-CM | POA: Diagnosis not present

## 2021-07-09 DIAGNOSIS — E559 Vitamin D deficiency, unspecified: Secondary | ICD-10-CM | POA: Diagnosis not present

## 2021-07-09 DIAGNOSIS — M810 Age-related osteoporosis without current pathological fracture: Secondary | ICD-10-CM | POA: Diagnosis not present

## 2021-07-09 DIAGNOSIS — H409 Unspecified glaucoma: Secondary | ICD-10-CM | POA: Diagnosis not present

## 2021-07-09 DIAGNOSIS — N184 Chronic kidney disease, stage 4 (severe): Secondary | ICD-10-CM | POA: Diagnosis not present

## 2021-07-09 DIAGNOSIS — E7800 Pure hypercholesterolemia, unspecified: Secondary | ICD-10-CM | POA: Diagnosis not present

## 2021-07-09 DIAGNOSIS — L989 Disorder of the skin and subcutaneous tissue, unspecified: Secondary | ICD-10-CM | POA: Diagnosis not present

## 2021-07-09 DIAGNOSIS — Z0000 Encounter for general adult medical examination without abnormal findings: Secondary | ICD-10-CM | POA: Diagnosis not present

## 2021-07-14 DIAGNOSIS — L570 Actinic keratosis: Secondary | ICD-10-CM | POA: Diagnosis not present

## 2021-07-14 DIAGNOSIS — C44722 Squamous cell carcinoma of skin of right lower limb, including hip: Secondary | ICD-10-CM | POA: Diagnosis not present

## 2021-07-25 ENCOUNTER — Ambulatory Visit
Admission: RE | Admit: 2021-07-25 | Discharge: 2021-07-25 | Disposition: A | Discharge status: Home / Self Care | Payer: Medicare Other | Source: Ambulatory Visit | Attending: Family Medicine | Admitting: Family Medicine

## 2021-07-25 ENCOUNTER — Other Ambulatory Visit: Payer: Self-pay

## 2021-07-25 DIAGNOSIS — Z1231 Encounter for screening mammogram for malignant neoplasm of breast: Secondary | ICD-10-CM

## 2021-08-07 DIAGNOSIS — C44529 Squamous cell carcinoma of skin of other part of trunk: Secondary | ICD-10-CM | POA: Diagnosis not present

## 2021-08-21 DIAGNOSIS — L0889 Other specified local infections of the skin and subcutaneous tissue: Secondary | ICD-10-CM | POA: Diagnosis not present

## 2021-08-21 DIAGNOSIS — L0101 Non-bullous impetigo: Secondary | ICD-10-CM | POA: Diagnosis not present

## 2021-09-03 DIAGNOSIS — Z85828 Personal history of other malignant neoplasm of skin: Secondary | ICD-10-CM | POA: Diagnosis not present

## 2021-09-03 DIAGNOSIS — L570 Actinic keratosis: Secondary | ICD-10-CM | POA: Diagnosis not present

## 2021-09-09 DIAGNOSIS — K5900 Constipation, unspecified: Secondary | ICD-10-CM | POA: Diagnosis not present

## 2021-09-22 DIAGNOSIS — R194 Change in bowel habit: Secondary | ICD-10-CM | POA: Diagnosis not present

## 2021-10-01 DIAGNOSIS — R198 Other specified symptoms and signs involving the digestive system and abdomen: Secondary | ICD-10-CM | POA: Diagnosis not present

## 2021-10-01 DIAGNOSIS — R194 Change in bowel habit: Secondary | ICD-10-CM | POA: Diagnosis not present

## 2021-10-20 DIAGNOSIS — B029 Zoster without complications: Secondary | ICD-10-CM | POA: Diagnosis not present

## 2021-10-20 DIAGNOSIS — L989 Disorder of the skin and subcutaneous tissue, unspecified: Secondary | ICD-10-CM | POA: Diagnosis not present

## 2021-10-27 DIAGNOSIS — B0229 Other postherpetic nervous system involvement: Secondary | ICD-10-CM | POA: Diagnosis not present

## 2021-10-27 DIAGNOSIS — E46 Unspecified protein-calorie malnutrition: Secondary | ICD-10-CM | POA: Diagnosis not present

## 2021-11-10 DIAGNOSIS — B0229 Other postherpetic nervous system involvement: Secondary | ICD-10-CM | POA: Diagnosis not present

## 2021-11-21 DIAGNOSIS — I10 Essential (primary) hypertension: Secondary | ICD-10-CM | POA: Diagnosis not present

## 2021-11-21 DIAGNOSIS — K5900 Constipation, unspecified: Secondary | ICD-10-CM | POA: Diagnosis not present

## 2021-12-04 DIAGNOSIS — H401133 Primary open-angle glaucoma, bilateral, severe stage: Secondary | ICD-10-CM | POA: Diagnosis not present

## 2021-12-04 DIAGNOSIS — H353131 Nonexudative age-related macular degeneration, bilateral, early dry stage: Secondary | ICD-10-CM | POA: Diagnosis not present

## 2021-12-17 DIAGNOSIS — H401133 Primary open-angle glaucoma, bilateral, severe stage: Secondary | ICD-10-CM | POA: Diagnosis not present

## 2021-12-23 DIAGNOSIS — H401133 Primary open-angle glaucoma, bilateral, severe stage: Secondary | ICD-10-CM | POA: Diagnosis not present

## 2021-12-28 ENCOUNTER — Encounter (HOSPITAL_BASED_OUTPATIENT_CLINIC_OR_DEPARTMENT_OTHER): Payer: Self-pay

## 2021-12-28 ENCOUNTER — Emergency Department (HOSPITAL_BASED_OUTPATIENT_CLINIC_OR_DEPARTMENT_OTHER): Payer: Medicare Other

## 2021-12-28 ENCOUNTER — Inpatient Hospital Stay (HOSPITAL_BASED_OUTPATIENT_CLINIC_OR_DEPARTMENT_OTHER)
Admission: EM | Admit: 2021-12-28 | Discharge: 2021-12-30 | DRG: 065 | Disposition: A | Discharge status: Home Health | Payer: Medicare Other | Attending: Family Medicine | Admitting: Family Medicine

## 2021-12-28 ENCOUNTER — Other Ambulatory Visit: Payer: Self-pay

## 2021-12-28 DIAGNOSIS — I10 Essential (primary) hypertension: Secondary | ICD-10-CM

## 2021-12-28 DIAGNOSIS — I639 Cerebral infarction, unspecified: Principal | ICD-10-CM | POA: Diagnosis present

## 2021-12-28 DIAGNOSIS — I444 Left anterior fascicular block: Secondary | ICD-10-CM | POA: Diagnosis not present

## 2021-12-28 DIAGNOSIS — R4182 Altered mental status, unspecified: Secondary | ICD-10-CM | POA: Diagnosis present

## 2021-12-28 DIAGNOSIS — R059 Cough, unspecified: Secondary | ICD-10-CM | POA: Diagnosis not present

## 2021-12-28 DIAGNOSIS — R791 Abnormal coagulation profile: Secondary | ICD-10-CM | POA: Diagnosis not present

## 2021-12-28 DIAGNOSIS — R4781 Slurred speech: Secondary | ICD-10-CM | POA: Diagnosis not present

## 2021-12-28 DIAGNOSIS — R29702 NIHSS score 2: Secondary | ICD-10-CM | POA: Diagnosis present

## 2021-12-28 DIAGNOSIS — R471 Dysarthria and anarthria: Secondary | ICD-10-CM | POA: Diagnosis present

## 2021-12-28 DIAGNOSIS — Z87891 Personal history of nicotine dependence: Secondary | ICD-10-CM | POA: Diagnosis not present

## 2021-12-28 DIAGNOSIS — R5383 Other fatigue: Secondary | ICD-10-CM | POA: Diagnosis present

## 2021-12-28 DIAGNOSIS — G459 Transient cerebral ischemic attack, unspecified: Secondary | ICD-10-CM

## 2021-12-28 DIAGNOSIS — M1990 Unspecified osteoarthritis, unspecified site: Secondary | ICD-10-CM | POA: Diagnosis present

## 2021-12-28 DIAGNOSIS — I129 Hypertensive chronic kidney disease with stage 1 through stage 4 chronic kidney disease, or unspecified chronic kidney disease: Secondary | ICD-10-CM | POA: Diagnosis not present

## 2021-12-28 DIAGNOSIS — M79604 Pain in right leg: Secondary | ICD-10-CM | POA: Diagnosis not present

## 2021-12-28 DIAGNOSIS — R8290 Unspecified abnormal findings in urine: Secondary | ICD-10-CM | POA: Diagnosis not present

## 2021-12-28 DIAGNOSIS — M810 Age-related osteoporosis without current pathological fracture: Secondary | ICD-10-CM | POA: Diagnosis present

## 2021-12-28 DIAGNOSIS — Z803 Family history of malignant neoplasm of breast: Secondary | ICD-10-CM

## 2021-12-28 DIAGNOSIS — Z7952 Long term (current) use of systemic steroids: Secondary | ICD-10-CM

## 2021-12-28 DIAGNOSIS — Z881 Allergy status to other antibiotic agents status: Secondary | ICD-10-CM

## 2021-12-28 DIAGNOSIS — I6381 Other cerebral infarction due to occlusion or stenosis of small artery: Secondary | ICD-10-CM | POA: Diagnosis not present

## 2021-12-28 DIAGNOSIS — N184 Chronic kidney disease, stage 4 (severe): Secondary | ICD-10-CM | POA: Diagnosis present

## 2021-12-28 DIAGNOSIS — H409 Unspecified glaucoma: Secondary | ICD-10-CM | POA: Diagnosis not present

## 2021-12-28 DIAGNOSIS — Z885 Allergy status to narcotic agent status: Secondary | ICD-10-CM

## 2021-12-28 DIAGNOSIS — Z85038 Personal history of other malignant neoplasm of large intestine: Secondary | ICD-10-CM | POA: Diagnosis not present

## 2021-12-28 DIAGNOSIS — Z888 Allergy status to other drugs, medicaments and biological substances status: Secondary | ICD-10-CM

## 2021-12-28 DIAGNOSIS — N1832 Chronic kidney disease, stage 3b: Secondary | ICD-10-CM | POA: Diagnosis not present

## 2021-12-28 DIAGNOSIS — Z20822 Contact with and (suspected) exposure to covid-19: Secondary | ICD-10-CM | POA: Diagnosis present

## 2021-12-28 DIAGNOSIS — Z85828 Personal history of other malignant neoplasm of skin: Secondary | ICD-10-CM | POA: Diagnosis not present

## 2021-12-28 DIAGNOSIS — Z800 Family history of malignant neoplasm of digestive organs: Secondary | ICD-10-CM

## 2021-12-28 DIAGNOSIS — E785 Hyperlipidemia, unspecified: Secondary | ICD-10-CM | POA: Diagnosis present

## 2021-12-28 DIAGNOSIS — Z833 Family history of diabetes mellitus: Secondary | ICD-10-CM

## 2021-12-28 DIAGNOSIS — N183 Chronic kidney disease, stage 3 unspecified: Secondary | ICD-10-CM | POA: Diagnosis present

## 2021-12-28 DIAGNOSIS — M79605 Pain in left leg: Secondary | ICD-10-CM | POA: Diagnosis not present

## 2021-12-28 DIAGNOSIS — I63412 Cerebral infarction due to embolism of left middle cerebral artery: Secondary | ICD-10-CM | POA: Diagnosis not present

## 2021-12-28 HISTORY — DX: Chronic kidney disease, stage 3 unspecified: N18.30

## 2021-12-28 HISTORY — DX: Unspecified intestinal obstruction, unspecified as to partial versus complete obstruction: K56.609

## 2021-12-28 LAB — CBC
HCT: 41.1 % (ref 36.0–46.0)
Hemoglobin: 13.4 g/dL (ref 12.0–15.0)
MCH: 31.7 pg (ref 26.0–34.0)
MCHC: 32.6 g/dL (ref 30.0–36.0)
MCV: 97.2 fL (ref 80.0–100.0)
Platelets: 215 10*3/uL (ref 150–400)
RBC: 4.23 MIL/uL (ref 3.87–5.11)
RDW: 13.1 % (ref 11.5–15.5)
WBC: 6.2 10*3/uL (ref 4.0–10.5)
nRBC: 0 % (ref 0.0–0.2)

## 2021-12-28 LAB — URINALYSIS, ROUTINE W REFLEX MICROSCOPIC
Bilirubin Urine: NEGATIVE
Glucose, UA: NEGATIVE mg/dL
Hgb urine dipstick: NEGATIVE
Ketones, ur: NEGATIVE mg/dL
Nitrite: NEGATIVE
Protein, ur: NEGATIVE mg/dL
Specific Gravity, Urine: 1.01 (ref 1.005–1.030)
pH: 5.5 (ref 5.0–8.0)

## 2021-12-28 LAB — PROTIME-INR
INR: 1 (ref 0.8–1.2)
Prothrombin Time: 12.6 seconds (ref 11.4–15.2)

## 2021-12-28 LAB — DIFFERENTIAL
Abs Immature Granulocytes: 0.02 10*3/uL (ref 0.00–0.07)
Basophils Absolute: 0 10*3/uL (ref 0.0–0.1)
Basophils Relative: 0 %
Eosinophils Absolute: 0.3 10*3/uL (ref 0.0–0.5)
Eosinophils Relative: 5 %
Immature Granulocytes: 0 %
Lymphocytes Relative: 24 %
Lymphs Abs: 1.5 10*3/uL (ref 0.7–4.0)
Monocytes Absolute: 0.5 10*3/uL (ref 0.1–1.0)
Monocytes Relative: 8 %
Neutro Abs: 3.9 10*3/uL (ref 1.7–7.7)
Neutrophils Relative %: 63 %

## 2021-12-28 LAB — COMPREHENSIVE METABOLIC PANEL
ALT: 8 U/L (ref 0–44)
AST: 16 U/L (ref 15–41)
Albumin: 3.9 g/dL (ref 3.5–5.0)
Alkaline Phosphatase: 48 U/L (ref 38–126)
Anion gap: 9 (ref 5–15)
BUN: 23 mg/dL (ref 8–23)
CO2: 25 mmol/L (ref 22–32)
Calcium: 9.3 mg/dL (ref 8.9–10.3)
Chloride: 106 mmol/L (ref 98–111)
Creatinine, Ser: 1.57 mg/dL — ABNORMAL HIGH (ref 0.44–1.00)
GFR, Estimated: 32 mL/min — ABNORMAL LOW (ref >60)
Glucose, Bld: 84 mg/dL (ref 70–99)
Potassium: 4 mmol/L (ref 3.5–5.1)
Sodium: 140 mmol/L (ref 135–145)
Total Bilirubin: 0.6 mg/dL (ref 0.3–1.2)
Total Protein: 6.9 g/dL (ref 6.5–8.1)

## 2021-12-28 LAB — CBG MONITORING, ED: Glucose-Capillary: 84 mg/dL (ref 70–99)

## 2021-12-28 LAB — APTT: aPTT: 54 seconds — ABNORMAL HIGH (ref 24–36)

## 2021-12-28 LAB — SARS CORONAVIRUS 2 BY RT PCR: SARS Coronavirus 2 by RT PCR: NEGATIVE

## 2021-12-28 MED ORDER — SODIUM CHLORIDE 0.9% FLUSH
3.0000 mL | Freq: Once | INTRAVENOUS | Status: AC
  Administered 2021-12-28: 3 mL via INTRAVENOUS
  Filled 2021-12-28: qty 3

## 2021-12-28 MED ORDER — ACETAMINOPHEN 325 MG PO TABS
ORAL_TABLET | ORAL | Status: AC
  Administered 2021-12-28: 650 mg
  Filled 2021-12-28: qty 2

## 2021-12-28 NOTE — ED Triage Notes (Addendum)
Per son pt was "perfectly fine" yesterday afternoon at 3pm went to church approx 4pm son noted pt was lethargic with slurred speech, left church at 5:30 pm, son gave her food thinking maybe she was hungry, fell asleep approx 9pm per son. This 0830 pt woke up as son was leaving for work, son reports she was better, no slurred speech at that time. While at work son called her at noon today and noted pt to have slurred speech, lethargic and unable to find her words.  Right arm weakness noted in triage  LKW 3pm yesterday   EDPA Ovid Curd to triage

## 2021-12-28 NOTE — ED Notes (Signed)
Patient transported to CT 

## 2021-12-28 NOTE — Progress Notes (Signed)
Plan of Care Note for accepted transfer   Patient: Laurie [name] MRN: [id number]   [place]: 12/28/2021  Facility [facility] transfer: [facility]   Requesting Provider:Dr. Zenia Resides Reason for transfer: TIA workup Facility course: 88 yo F with HTN, CKD stage 4 who had slurred speech yesterday at 3 PM that resolved.  However continues to have some confusion and lethargy.   Has no leukocytosis. Negative UA.  CT head is negative.  Neurology Dr. Reeves Forth was consulted and recommends her to be admitted for TIA workup and MRI.   plan of care: The patient is accepted for admission to Telemetry unit, at [facility]..  ED physician to remain care of patient while she remains in the ED.  Author: Orene Desanctis, DO 12/28/2021  Check [email or web address] for on-call coverage.  Nursing staff, Please call [place] number on Amion as soon as patient's arrival, so appropriate admitting provider can evaluate the pt.

## 2021-12-28 NOTE — Plan of Care (Signed)
Received a call for curbside from Dr. Zenia Resides.  88 year old with slurred speech started yesterday at 3 PM that has resolved.  No focal weakness on exam has generalized weakness.  She also appears to have some confusion.  Discussed need for TIA work-up including MRI.  He will plan to transfer the [facility] to [facility] for further work-up

## 2021-12-28 NOTE — ED Provider Notes (Signed)
[facility] EMERGENC[facility] DEPT Provider Note   CSN: [id number] Arrival date & time: 12/28/21  1341     History  Chief Complaint  Patient presents with   Altered Mental Status    Laurie [name] is a 88 y.o. female.  88 year old female presents with slurred speech and lethargy which began yesterday around 3 PM.  Patient's symptoms somewhat resolved.  Returned again this morning around 830.  Her son noted that she did not appear to be herself.  She denies any new medications.  No recent illnesses.  No headaches.  No recent drug use.  No prior history of CVA.      Home Medications Prior to Admission medications   Medication Sig Start Date End Date Taking? Authorizing Provider  acetaminophen (TYLENOL) 500 MG tablet Take 1,000 mg by mouth every 6 (six) hours as needed.    [provider]  bacitracin 500 UNIT/GM ointment Apply 1 application topically 4 (four) times daily. 07/09/13   Kingsley Spittle, MD  bisacodyl (DULCOLAX) 5 MG EC tablet Take 5 mg by mouth daily as needed for moderate constipation.    [provider]  brimonidine (ALPHAGAN) 0.15 % ophthalmic solution 1 drop 3 (three) times daily.    [provider]  Cholecalciferol (VITAMIN D) 2000 UNITS CAPS Take 1 capsule by mouth once a week.     [provider]  dorzolamide-timolol (COSOPT) 22.3-6.8 MG/ML ophthalmic solution 1 drop 2 (two) times daily.      [provider]  methylPREDNISolone (MEDROL) 4 MG tablet Take as directed 02/20/19   Pete Pelt, PA-C  Travoprost, BAK Free, (TRAVATAMN) 0.004 % SOLN ophthalmic solution Place 1 drop into both eyes at bedtime.     [provider]      Allergies    Alendronate sodium, Miacalcin, Neomycin, Neosporin [neomycin-polymyxin-gramicidin], Risedronate sodium, and Codeine    Review of Systems   Review of Systems  All other systems reviewed and are negative.  Physical Exam Updated Vital Signs BP (!) 159/88 (BP  Location: Right Arm)   Pulse (!) 56   Temp 98.2 F (36.8 C)   Resp 16   SpO2 100%  Physical Exam Vitals and nursing note reviewed.  Constitutional:      General: She is not in acute distress.    Appearance: Normal appearance. She is well-developed. She is not toxic-appearing.  HENT:     Head: Normocephalic and atraumatic.  Eyes:     General: Lids are normal.     Conjunctiva/sclera: Conjunctivae normal.     Pupils: Pupils are equal, round, and reactive to light.  Neck:     Thyroid: No thyroid mass.     Trachea: No tracheal deviation.  Cardiovascular:     Rate and Rhythm: Normal rate and regular rhythm.     Heart sounds: Normal heart sounds. No murmur heard.   No gallop.  Pulmonary:     Effort: Pulmonary effort is normal. No respiratory distress.     Breath sounds: Normal breath sounds. No stridor. No decreased breath sounds, wheezing, rhonchi or rales.  Abdominal:     General: There is no distension.     Palpations: Abdomen is soft.     Tenderness: There is no abdominal tenderness. There is no rebound.  Musculoskeletal:        General: No tenderness. Normal range of motion.     Cervical back: Normal range of motion and neck supple.  Skin:    General: Skin is warm and dry.  Findings: No abrasion or rash.  Neurological:     General: No focal deficit present.     Mental Status: She is alert and oriented to person, place, and time. Mental status is at baseline.     GCS: GCS eye subscore is 4. GCS verbal subscore is 5. GCS motor subscore is 6.     Cranial Nerves: No cranial nerve deficit.     Sensory: No sensory deficit.     Motor: Motor function is intact.     Comments: Strength is 5 of 5 throughout  Psychiatric:        Attention and Perception: Attention normal.        Speech: Speech normal.        Behavior: Behavior normal.    ED Results / Procedures / Treatments   Labs (all labs ordered are listed, but only abnormal results are displayed) Labs Reviewed   PROTIME-INR  APTT  CBC  DIFFERENTIAL  COMPREHENSIVE METABOLIC PANEL  URINALYSIS, ROUTINE W REFLEX MICROSCOPIC  CBG MONITORING, ED    EKG EKG Interpretation  Date/Time:  Sunday December 28 2021 15:10:45 EDT Ventricular Rate:  50 PR Interval:  157 QRS Duration: 88 QT Interval:  491 QTC Calculation: 448 R Axis:   -42 Text Interpretation: Sinus rhythm Probable left atrial enlargement Left anterior fascicular block Probable anterior infarct, old Confirmed by Lacretia Leigh ([id number]) on 12/28/2021 3:25:52 PM  Radiology CT HEAD WO CONTRAST  Result Date: 12/28/2021 CLINICAL DATA:  Slurred speech EXAM: CT HEAD WITHOUT CONTRAST TECHNIQUE: Contiguous axial images were obtained from the base of the skull through the vertex without intravenous contrast. RADIATION DOSE REDUCTION: This exam was performed according to the departmental dose-optimization program which includes automated exposure control, adjustment of the mA and/or kV according to patient size and/or use of iterative reconstruction technique. COMPARISON:  None Available. FINDINGS: Brain: No evidence of acute infarction, hemorrhage, cerebral edema, mass, mass effect, or midline shift. No hydrocephalus or extra-axial fluid collection. Periventricular white matter changes, likely the sequela of chronic small vessel ischemic disease. Vascular: No hyperdense vessel. Atherosclerotic calcifications in the intracranial carotid and vertebral arteries. Skull: Normal. Negative for fracture or focal lesion. Sinuses/Orbits: Complete opacification of the right maxillary sinus, likely chronic sinusitis. Additional mild mucosal thickening in the ethmoid air cells. Status post bilateral lens replacements. Other: The mastoid air cells are well aerated. IMPRESSION: No acute intracranial process. Electronically Signed   By: Merilyn Baba M.D.   On: 12/28/2021 15:05    Procedures Procedures    Medications Ordered in ED Medications  sodium chloride flush (NS) 0.9  % injection 3 mL (has no administration in time range)    ED Course/ Medical Decision Making/ A&P                           Medical Decision Making Amount and/or Complexity of Data Reviewed Labs: ordered. Radiology: ordered.  Patient is EKG per my interpretation shows no acute hemic findings. Patient presented here with questionable strokelike symptoms.  Head CT per my interpretation showed no new findings. Considered infectious etiology as cause of patient's lethargy and so far negative.  We will add chest x-ray.  Has no leukocytosis on her CBC.  Urinalysis negative.  Discussed case with neurology on-call who agrees with plan for TIA work-up.  Requested patient be sent to [facility].  Will consult hospitalist for admission.  Discussed with patient and family and they are in agreement.  This  patient is attentionally critically ill and requires admission        Final Clinical Impression(s) / ED Diagnoses Final diagnoses:  None    Rx / DC Orders ED Discharge Orders     None         Lacretia Leigh, MD 12/28/21 1753

## 2021-12-29 ENCOUNTER — Observation Stay (HOSPITAL_COMMUNITY): Payer: Medicare Other

## 2021-12-29 ENCOUNTER — Encounter (HOSPITAL_BASED_OUTPATIENT_CLINIC_OR_DEPARTMENT_OTHER): Payer: Self-pay | Admitting: Family Medicine

## 2021-12-29 ENCOUNTER — Observation Stay (HOSPITAL_BASED_OUTPATIENT_CLINIC_OR_DEPARTMENT_OTHER): Payer: Medicare Other

## 2021-12-29 DIAGNOSIS — R8290 Unspecified abnormal findings in urine: Secondary | ICD-10-CM | POA: Diagnosis not present

## 2021-12-29 DIAGNOSIS — H409 Unspecified glaucoma: Secondary | ICD-10-CM

## 2021-12-29 DIAGNOSIS — R5383 Other fatigue: Secondary | ICD-10-CM | POA: Diagnosis not present

## 2021-12-29 DIAGNOSIS — I63412 Cerebral infarction due to embolism of left middle cerebral artery: Secondary | ICD-10-CM | POA: Diagnosis not present

## 2021-12-29 DIAGNOSIS — R471 Dysarthria and anarthria: Secondary | ICD-10-CM

## 2021-12-29 DIAGNOSIS — N184 Chronic kidney disease, stage 4 (severe): Secondary | ICD-10-CM | POA: Diagnosis present

## 2021-12-29 DIAGNOSIS — I639 Cerebral infarction, unspecified: Secondary | ICD-10-CM | POA: Diagnosis not present

## 2021-12-29 DIAGNOSIS — N1832 Chronic kidney disease, stage 3b: Secondary | ICD-10-CM | POA: Diagnosis not present

## 2021-12-29 DIAGNOSIS — R791 Abnormal coagulation profile: Secondary | ICD-10-CM

## 2021-12-29 DIAGNOSIS — M79605 Pain in left leg: Secondary | ICD-10-CM

## 2021-12-29 DIAGNOSIS — M79604 Pain in right leg: Secondary | ICD-10-CM

## 2021-12-29 DIAGNOSIS — N183 Chronic kidney disease, stage 3 unspecified: Secondary | ICD-10-CM | POA: Diagnosis present

## 2021-12-29 LAB — BASIC METABOLIC PANEL
Anion gap: 9 (ref 5–15)
BUN: 22 mg/dL (ref 8–23)
CO2: 22 mmol/L (ref 22–32)
Calcium: 9.3 mg/dL (ref 8.9–10.3)
Chloride: 109 mmol/L (ref 98–111)
Creatinine, Ser: 1.55 mg/dL — ABNORMAL HIGH (ref 0.44–1.00)
GFR, Estimated: 32 mL/min — ABNORMAL LOW (ref >60)
Glucose, Bld: 146 mg/dL — ABNORMAL HIGH (ref 70–99)
Potassium: 3.8 mmol/L (ref 3.5–5.1)
Sodium: 140 mmol/L (ref 135–145)

## 2021-12-29 LAB — ECHOCARDIOGRAM COMPLETE
AR max vel: 2.43 cm2
AV Peak grad: 6.5 mmHg
Ao pk vel: 1.28 m/s
Area-P 1/2: 2.91 cm2
Height: 59 in
S' Lateral: 1.9 cm
Weight: 2064 oz

## 2021-12-29 LAB — LIPID PANEL
Cholesterol: 225 mg/dL — ABNORMAL HIGH (ref 0–200)
HDL: 72 mg/dL (ref >40)
LDL Cholesterol: 130 mg/dL — ABNORMAL HIGH (ref 0–99)
Total CHOL/HDL Ratio: 3.1 RATIO
Triglycerides: 114 mg/dL (ref <150)
VLDL: 23 mg/dL (ref 0–40)

## 2021-12-29 LAB — APTT: aPTT: 51 seconds — ABNORMAL HIGH (ref 24–36)

## 2021-12-29 LAB — CBC
HCT: 45.2 % (ref 36.0–46.0)
Hemoglobin: 15.3 g/dL — ABNORMAL HIGH (ref 12.0–15.0)
MCH: 32.5 pg (ref 26.0–34.0)
MCHC: 33.8 g/dL (ref 30.0–36.0)
MCV: 96 fL (ref 80.0–100.0)
Platelets: 260 10*3/uL (ref 150–400)
RBC: 4.71 MIL/uL (ref 3.87–5.11)
RDW: 12.8 % (ref 11.5–15.5)
WBC: 7.7 10*3/uL (ref 4.0–10.5)
nRBC: 0 % (ref 0.0–0.2)

## 2021-12-29 LAB — PROTIME-INR
INR: 0.9 (ref 0.8–1.2)
Prothrombin Time: 12.3 seconds (ref 11.4–15.2)

## 2021-12-29 LAB — TSH: TSH: 2.321 u[IU]/mL (ref 0.350–4.500)

## 2021-12-29 LAB — HEMOGLOBIN A1C
Hgb A1c MFr Bld: 5.5 % (ref 4.8–5.6)
Mean Plasma Glucose: 111.15 mg/dL

## 2021-12-29 MED ORDER — ACETAMINOPHEN 325 MG PO TABS
650.0000 mg | ORAL_TABLET | ORAL | Status: DC | PRN
  Administered 2021-12-29: 650 mg via ORAL
  Filled 2021-12-29: qty 2

## 2021-12-29 MED ORDER — ACETAMINOPHEN 160 MG/5ML PO SOLN: 650.0000 mg | ORAL | Status: DC | PRN

## 2021-12-29 MED ORDER — HEPARIN SODIUM (PORCINE) 5000 UNIT/ML IJ SOLN
5000.0000 [IU] | Freq: Three times a day (TID) | INTRAMUSCULAR | Status: DC
  Administered 2021-12-29 – 2021-12-30 (×3): 5000 [IU] via SUBCUTANEOUS
  Filled 2021-12-29 (×3): qty 1

## 2021-12-29 MED ORDER — SODIUM CHLORIDE 0.9 % IV SOLN
Freq: Once | INTRAVENOUS | Status: AC
Start: 2021-12-29 — End: 2021-12-29

## 2021-12-29 MED ORDER — CLOPIDOGREL BISULFATE 75 MG PO TABS: 75.0000 mg | ORAL_TABLET | Freq: Every day | ORAL | Status: DC

## 2021-12-29 MED ORDER — CLOPIDOGREL BISULFATE 75 MG PO TABS
75.0000 mg | ORAL_TABLET | Freq: Every day | ORAL | Status: DC
  Administered 2021-12-29 – 2021-12-30 (×2): 75 mg via ORAL
  Filled 2021-12-29 (×2): qty 1

## 2021-12-29 MED ORDER — STROKE: EARLY STAGES OF RECOVERY BOOK: Freq: Once | Status: AC

## 2021-12-29 MED ORDER — SODIUM CHLORIDE 0.9 % IV SOLN: Freq: Once | INTRAVENOUS | Status: DC

## 2021-12-29 MED ORDER — ATORVASTATIN CALCIUM 40 MG PO TABS
40.0000 mg | ORAL_TABLET | Freq: Every day | ORAL | Status: DC
  Administered 2021-12-29 – 2021-12-30 (×2): 40 mg via ORAL
  Filled 2021-12-29 (×2): qty 1

## 2021-12-29 MED ORDER — ACETAMINOPHEN 650 MG RE SUPP: 650.0000 mg | RECTAL | Status: DC | PRN

## 2021-12-29 MED ORDER — ASPIRIN 81 MG PO TBEC
81.0000 mg | DELAYED_RELEASE_TABLET | Freq: Every day | ORAL | Status: DC
  Administered 2021-12-29 – 2021-12-30 (×2): 81 mg via ORAL
  Filled 2021-12-29 (×2): qty 1

## 2021-12-29 MED ORDER — STROKE: EARLY STAGES OF RECOVERY BOOK
Status: AC
  Filled 2021-12-29: qty 1

## 2021-12-29 MED ORDER — LORAZEPAM 2 MG/ML IJ SOLN: 0.5000 mg | Freq: Once | INTRAMUSCULAR | Status: DC | PRN

## 2021-12-29 NOTE — ED Provider Notes (Signed)
6:43 AM  I was asked by nursing to speak with this patient's son.  He expressed concerns regarding the patient's care.  Felt like she was not receiving good care.  She has been here for 17 hours and admitted for approximately 12.  She is admitted for TIA work-up.  He is concerned that she has not received proper nutrition or ongoing work-up.  I discussed the process of transfer to [facility]  Generally we would initiate ED ED transfer at 8 to 10 hours; however, given that this was around 4 AM this morning, felt it was reasonable to wait until the bed placement meeting at 7 AM.  I did offer ED to ED transfer; however, he was concerned about this as well.  We discussed the process from here.  When she is admitted and is transferred, she will likely need at least 24 hours for a TIA work-up.  He would like to have a contact at [facility] to ensure that she is receiving proper care.  I have encouraged him to make sure he engages the charge nurse on the floor as well as the admitting physician.  Patient has been hemodynamically stable.  She is resting comfortably without complaint while I am in the room.   Merryl Hacker, MD 12/29/21 [phone]

## 2021-12-29 NOTE — ED Notes (Signed)
Patient's son concerned about breakfast tray for patient. RN expressed that it would hopefully be a quick process as the patient has no history of HTN or DM so it should be a normal breakfast tray order once she is evaluated by the MD. RN accepting patient aware of patient's need for breakfast tray order and the fact that she has not eaten

## 2021-12-29 NOTE — Plan of Care (Signed)
  Problem: Education: Goal: Knowledge of General Education information will improve Description Including pain rating scale, medication(s)/side effects and non-pharmacologic comfort measures Outcome: Progressing   Problem: Health Behavior/Discharge Planning: Goal: Ability to manage health-related needs will improve Outcome: Progressing   

## 2021-12-29 NOTE — H&P (Addendum)
History and Physical    Patient: Laurie [name] SWN:[id number] DOB: 1931/08/17 DOA: 12/28/2021 DOS: the patient was seen and examined on 12/29/2021 PCP: Pcp, No  Patient coming from: transfer from [place]  Chief Complaint:  Chief Complaint  Patient presents with   Altered Mental Status   HPI: Laurie [name] is a 88 y.o. female with medical history significant of hypertension, CKD stage III/IV, arthritis, and SBO  s/p left hemicolectomy who presents with complaints of slurred speech and lethargy.  Patient lives at home and her son moved in with her approximately a year ago after patient was no longer able to drive.  He reports that she still able to manage her finances and she gets around without need of assistance.  The patient along with the assistance of her son present at bedside provide history.  Currently 2 days ago the the patient had been in her normal state of health at around 3 PM sitting at the dining room table doing bills.  When the son came to check on her at around 27 her speech was slurred and she appeared tired.  He had gotten her something to eat but she still seemed to be fatigued.  She went to bed around 9:30 PM at night, and when she woke up at around 8:30 AM seemed to be in her normal state of health.  However at around 12:30 PM when he checked on her that afternoon she was slurring her speech again and seemed lethargic which she took her to be evaluated.  Patient denies having any focal weakness, changes in vision, chest pain, palpitations, nausea, vomiting, abdominal pain, constipation, diarrhea, or dysuria.  She is not on a daily aspirin or any anticoagulation  Upon admission into the emergency department patient was seen to be afebrile with pulse 51-72, respirations 16-22, blood pressures elevated up to 214/91, and O2 saturations maintained on room air.  CT scan of the head was negative for any acute abnormality.   Labs significant for creatinine 1.57, BUN 23, APTT  54, and all other labs relatively within normal limits.  Urinalysis noted large leukocytes without signs of bacteria or signs of infection.  Dr.Palikh of neurology was consulted and recommends her to be admitted for TIA workup and MRI. Since she had been at the hospital she does complain of some right leg pain which is new.  Her son makes note that she seems to still be having a difficult time comprehending.  Review of Systems: As mentioned in the history of present illness. All other systems reviewed and are negative. Past Medical History:  Diagnosis Date   CKD (chronic kidney disease), stage III (HCC)    Family history of breast cancer    mother   Family history of colon cancer    father   Glaucoma    Osteopenia    SBO (small bowel obstruction) ([place])    Past Surgical History:  Procedure Laterality Date   APPENDECTOMY     CESAREAN SECTION     x6   COLECTOMY     ESOPHAGOGASTRODUODENOSCOPY  07/07/2012   Procedure: ESOPHAGOGASTRODUODENOSCOPY (EGD);  Surgeon: Arta Silence, MD;  Location: [facility] ENDOSCOPY;  Service: Endoscopy;  Laterality: N/A;   LEFT COLECTOMY  04/30/2011   Sr Streck   tubal cyst  04/30/2011   L fallopian tube cyst incidentally found   Social History:  reports that she has quit smoking. Her smoking use included cigarettes. She smoked an average of .5 packs per day. She does not  have any smokeless tobacco history on file. She reports that she does not drink alcohol and does not use drugs.  Allergies  Allergen Reactions   Alendronate Sodium Nausea And Vomiting   Miacalcin     Unknown   Neomycin     Unknown   Neosporin [Neomycin-Polymyxin-Gramicidin] Swelling   Risedronate Sodium Nausea And Vomiting   Codeine Palpitations    Family History  Problem Relation Age of Onset   Breast cancer Mother    Colon cancer Father    Diabetes Sister     Prior to Admission medications   Medication Sig Start Date End Date Taking? Authorizing Provider  acetaminophen (TYLENOL)  500 MG tablet Take 1,000 mg by mouth every 6 (six) hours as needed.    [provider]  bacitracin 500 UNIT/GM ointment Apply 1 application topically 4 (four) times daily. 07/09/13   Kingsley Spittle, MD  bisacodyl (DULCOLAX) 5 MG EC tablet Take 5 mg by mouth daily as needed for moderate constipation.    [provider]  brimonidine (ALPHAGAN) 0.15 % ophthalmic solution 1 drop 3 (three) times daily.    [provider]  Cholecalciferol (VITAMIN D) 2000 UNITS CAPS Take 1 capsule by mouth once a week.     [provider]  dorzolamide-timolol (COSOPT) 22.3-6.8 MG/ML ophthalmic solution 1 drop 2 (two) times daily.      [provider]  methylPREDNISolone (MEDROL) 4 MG tablet Take as directed 02/20/19   Pete Pelt, PA-C  Travoprost, BAK Free, (TRAVATAMN) 0.004 % SOLN ophthalmic solution Place 1 drop into both eyes at bedtime.     [provider]    Physical Exam: Vitals:   12/28/21 2259 12/29/21 0451 12/29/21 0745 12/29/21 0830  BP:  (!) 153/92  (!) 180/83  Pulse:  66 66 66  Resp:  '18 18 16  '$ Temp: 98.8 F (37.1 C)   98.7 F (37.1 C)  TempSrc: Oral     SpO2:  95% 96% 97%    Constitutional: Elderly female currently in no acute distress eating breakfast Eyes: PERRL, lids and conjunctivae normal ENMT: Mucous membranes are moist.   Neck: normal, supple,  Respiratory: clear to auscultation bilaterally, no wheezing, no crackles. Normal respiratory effort. No accessory muscle use.  Cardiovascular: Bradycardic, no murmurs / rubs / gallops. No extremity edema. 2+ pedal pulses.   Abdomen: no abdominal/CVA tenderness, no masses palpated. Bowel sounds positive.  Musculoskeletal: Arthritic changes noted of the bilateral hands.  Good ROM, no contractures. Normal muscle tone.  Skin: no rashes, lesions, ulcers. No induration Neurologic: CN 2-12 grossly intact. Sensation intact. Strength 5/5 in all 4.  Psychiatric: Normal judgment and insight. Alert  and oriented x 3. Normal mood.   Data Reviewed:  Sinus rhythm at 50 bpm with left anterior fascicular block.  Assessment and Plan:  Dysarthria and lethargy Acute. Patient presented with complaints of slurred speech and lethargy which started 2 days ago.  Patient still appears to be fatigued and son feels like patient is still having a hard time comprehending at times.  CT scan of the brain negative for any acute abnormality.  Patient was not a thrombolytic candidate due to being out of the window and patient's symptoms.  Neurology had recommended transfer for need of MRI. -Admit to telemetry bed -Stroke order set initiated -Neuro checks -Check Hemoglobin A1c, lipid panel, TSH -Check MRI w/o contrast and MRA of the head and neck -Check echocardiogram -PT/OT/Speech to eval and treat -Aspirin +/- Plavix per neurology recommended -  Follow-up telemetry  -Appreciate neurology consultative services, will follow-up   Coagulation test abnormality Acute.  On admission APTT was elevated at 54.  Patient is not on any anticoagulation or antiplatelet medications.  Possibly related to supplements that the patient takes over-the-counter.   -Recheck APTT -May warrant further investigation  Abnormal urinalysis Patient denied any complaints of dysuria or abdominal discomfort.  Urinalysis noted large leukocytes, negative nitrites, no bacteria seen, 0-5 squamous epithelial cells, and 0-5 WBCs. -Check urine culture  Right leg pain Acute.  Patient complained of right leg pain since waiting for transfer to the hospital. -Check Doppler ultrasound of the lower extremities  CKD stage IIIb/IV Stable.  On admission creatinine 1.57 with BUN 23.  Baseline creatinine noted to be 1.53 back in 2021 per records on care everywhere. -Continue to monitor  Glaucoma -Continue home eyedrops  DVT prophylaxis: Heparin Advance Care Planning:   Code Status: Full Code   Consults: neurology  Family Communication: Son  updated at bedside  Severity of Illness: The appropriate patient status for this patient is OBSERVATION. Observation status is judged to be reasonable and necessary in order to provide the required intensity of service to ensure the patient's safety. The patient's presenting symptoms, physical exam findings, and initial radiographic and laboratory data in the context of their medical condition is felt to place them at decreased risk for further clinical deterioration. Furthermore, it is anticipated that the patient will be medically stable for discharge from the hospital within 2 midnights of admission.   Author: Norval Morton, MD 12/29/2021 9:28 AM  For on call review [email or web address].

## 2021-12-29 NOTE — Evaluation (Signed)
Speech Language Pathology Evaluation Patient Details Name: Laurie [name] MRN: [id number] DOB: Oct 02, 1931 Today's Date: 12/29/2021 Time: 3267-1245 SLP Time Calculation (min) (ACUTE ONLY): 13 min  Problem List:  Patient Active Problem List   Diagnosis Date Noted   Lethargy 12/29/2021   Coagulation test abnormality 12/29/2021   Abnormal urinalysis 12/29/2021   CKD (chronic kidney disease), stage III ([place]) 12/29/2021   Glaucoma 12/29/2021   Dysarthria 12/28/2021   Chronic pain of right knee 01/15/2020   Unilateral primary osteoarthritis, left knee 01/25/2018   Unilateral primary osteoarthritis, right knee 01/25/2018   Chronic pain of both knees 01/25/2018   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[place]) 12/22/2010   Past Medical History:  Past Medical History:  Diagnosis Date   CKD (chronic kidney disease), stage III ([place])    Family history of breast cancer    mother   Family history of colon cancer    father   Glaucoma    Osteopenia    SBO (small bowel obstruction) ([place])    Past Surgical History:  Past Surgical History:  Procedure Laterality Date   APPENDECTOMY     CESAREAN SECTION     x6   ESOPHAGOGASTRODUODENOSCOPY  07/07/2012   Procedure: ESOPHAGOGASTRODUODENOSCOPY (EGD);  Surgeon: Arta Silence, MD;  Location: [facility] ENDOSCOPY;  Service: Endoscopy;  Laterality: N/A;   GLAUCOMA SURGERY     LEFT COLECTOMY  04/30/2011   Sr Streck   tubal cyst  04/30/2011   L fallopian tube cyst incidentally found   HPI:  Laurie [name] is a 88 y.o. female with a past medical history of CKDIV, osteoporosis, HLD, squamous cell carcinoma, glaucoma, colon cancer, and hypertension. Admitted for slurred speech, confusion and lethargy. 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   Assessment / Plan / Recommendation Clinical Impression  Pt exhbits mild dysarthria marked by imprecise articulation in sentences however when asked to repeat she indpendently increased intensity and precision. She is  oriented with adequate awareness and problem solving, able to understand language and express her thoughts. Follows simple commands and was 1/2 for more abstract direction. Her memory for 4 word recall was impaired and scored in the severe range. Educated and discussed with pt and family strategies to supplement her memory. Daughter reported that they help her recall appointments etc. She does not take meds at home and she is in charge of her finances. Pt has necessary help at home for her memory and therapist encouraged her to use her speech strategies (noted she was very tired and hadn't slept much past 24 hours). No further ST needed.    SLP Assessment  SLP Recommendation/Assessment: Patient does not need any further Speech Lanaguage Pathology Services SLP Visit Diagnosis: Cognitive communication deficit (R41.841);Dysarthria and anarthria (R47.1)    Recommendations for follow up therapy are one component of a multi-disciplinary discharge planning process, led by the attending physician.  Recommendations may be updated based on patient status, additional functional criteria and insurance authorization.    Follow Up Recommendations  No SLP follow up    Assistance Recommended at Discharge  None  Functional Status Assessment    Frequency and Duration           SLP Evaluation Cognition  Overall Cognitive Status: Within Functional Limits for tasks assessed Arousal/Alertness:  (awake but tired) Orientation Level: Oriented X4 Year: 2023 Month: June Day of Week: Correct Attention: Sustained Sustained Attention: Appears intact Memory: Impaired Memory Impairment: Retrieval deficit;Storage deficit (1/4 independent) Awareness: Appears intact Problem Solving: Appears intact Safety/Judgment:  Appears intact       Comprehension  Auditory Comprehension Overall Auditory Comprehension: Appears within functional limits for tasks assessed Commands: Impaired (1/2 for abstract command) Visual  Recognition/Discrimination Discrimination: Not tested Reading Comprehension Reading Status: Not tested    Expression Expression Primary Mode of Expression: Verbal Verbal Expression Overall Verbal Expression: Appears within functional limits for tasks assessed Naming:  (divergent naming 8 items in category) Pragmatics: No impairment Written Expression Written Expression: Not tested   Oral / Motor  Oral Motor/Sensory Function Overall Oral Motor/Sensory Function: Within functional limits Motor Speech Overall Motor Speech: Impaired Respiration: Within functional limits Phonation: Normal Resonance: Within functional limits Articulation: Impaired Level of Impairment: Sentence Intelligibility: Intelligibility reduced Word: 75-100% accurate Phrase: 75-100% accurate Sentence: 75-100% accurate Conversation: 75-100% accurate Motor Planning: Witnin functional limits            Houston Siren 12/29/2021, 3:42 PM

## 2021-12-29 NOTE — Progress Notes (Signed)
OT Cancellation Note  Patient Details Name: Laurie [name] MRN: [id number] DOB: 03/16/1932   Cancelled Treatment:    Reason Eval/Treat Not Completed: Patient at procedure or test/ unavailable off unit at this time. Will follow as see as able.  [place] 12/29/2021, 1:16 PM

## 2021-12-29 NOTE — ED Notes (Signed)
Family requesting to take patient home as they don't feel the care is adequate here. Will notify CN and MD.

## 2021-12-29 NOTE — ED Notes (Signed)
Patient's son reports he wants his mother to have breakfast when she arrives at [facility] and that he wishes to see the hospitalist/neurologist upon arrival to [facility]. Patient's son visibly frustrated and states he has been here going on 19 hours.

## 2021-12-29 NOTE — Consult Note (Addendum)
Neurology Consultation  Reason for Consult: ?TIA workup Referring Physician: Tamala Laurie [name]  CC: slurred speech, confusion, lethargy  History is obtained from: family  HPI: Laurie Laurie [name] is [name] 88 y.o. female with [name] past medical history of CKDIV, osteoporosis, HLD, squamous cell carcinoma, glaucoma, colon cancer, and hypertension.   The patient had been in her normal state of health at around 3 PM on 6/3 sitting at the dining room table doing bills.  When the son came to check on her at around 89 her speech was slurred and she appeared tired.  He had gotten her something to eat but she still seemed to be fatigued and she said that she had not taken her daily nap. She went to bed around 9:30 PM at night, and when she woke up at around 8:30 AM on 6/4 seemed to be in her normal state of health.  However at around 12:30 PM when he checked on her that afternoon she was slurring her speech again and seemed lethargic which she took her to be evaluated. She is not currently on daily aspirin or any anticoagulation. Son states that she is currently only taking medications (eye drops) for glaucoma.    ROS: [name] complete ROS was performed and is negative except as noted in the HPI.  Unable to obtain due to altered mental status.   Past Medical History:  Diagnosis Date   Family history of breast cancer    mother   Family history of colon cancer    father   Glaucoma    Osteopenia      Family History  Problem Relation Age of Onset   Breast cancer Mother    Colon cancer Father    Diabetes Sister      Social History:   reports that she has quit smoking. Her smoking use included cigarettes. She smoked an average of .5 packs per day. She does not have any smokeless tobacco history on file. She reports that she does not drink alcohol and does not use drugs.  Medications  Current Facility-Administered Medications:    acetaminophen (TYLENOL) tablet 650 mg, 650 mg, Oral, Q4H PRN **OR** acetaminophen  (TYLENOL) 160 MG/5ML solution 650 mg, 650 mg, Per Tube, Q4H PRN **OR** acetaminophen (TYLENOL) suppository 650 mg, 650 mg, Rectal, Q4H PRN, Laurie Laurie [name], Laurie A, MD   heparin injection 5,000 Units, 5,000 Units, Subcutaneous, Q8H, Laurie Laurie [name], Laurie A, MD   LORazepam (ATIVAN) injection 0.5 mg, 0.5 mg, Intravenous, Once PRN, Laurie Morton, MD   Exam: Current vital signs: BP (!) 165/65 (BP Location: Left Wrist)   Pulse 72   Temp 97.8 F (36.6 C) (Oral)   Resp 18   Ht '4\' 11"'$  (1.499 m)   Wt 58.5 kg   SpO2 96%   BMI 26.05 kg/m  Vital signs in last 24 hours: Temp:  [97.8 F (36.6 C)-98.8 F (37.1 C)] 97.8 F (36.6 C) (06/05 0943) Pulse Rate:  [51-72] 72 (06/05 0943) Resp:  [16-22] 18 (06/05 0943) BP: (146-214)/(59-141) 165/65 (06/05 0943) SpO2:  [81 %-100 %] 96 % (06/05 0943) Weight:  [58.5 kg] 58.5 kg (06/05 0943)  GENERAL: Awake, alert, in no acute distress Psych: Affect appropriate for situation, patient is calm and cooperative with examination Head: Normocephalic and atraumatic, without obvious abnormality EENT: Normal conjunctivae, dry mucous membranes, no OP obstruction LUNGS: Normal respiratory effort. Non-labored breathing on room air CV: Regular rate and rhythm on telemetry ABDOMEN: Soft, non-tender, non-distended Extremities: warm, Laurie [name] perfused, without obvious deformity  NEURO:  Mental Status: Awake, drowsy, but oriented to person, place, time, and situation. She is able to provide [name] clear and coherent history of present illness. Speech/Language: speech is mildly dysarthric.   Naming, repetition, fluency, and comprehension intact without aphasia  Able to name 12/15 animals and remembered 1/3 words.  No neglect is noted Cranial Nerves:  II: PERRL 2 mm/brisk. visual fields full.  III, IV, VI: EOMI. Lid elevation symmetric and full.  V: Sensation is intact to light touch and symmetrical to face. Blinks to threat. Moves jaw back and forth.  VII: Face is symmetric resting  and smiling. Able to puff cheeks and raise eyebrows.  VIII: Hearing intact to voice IX, X: Palate elevation is symmetric. Phonation normal.  XI: Normal sternocleidomastoid and trapezius muscle strength XII: Tongue protrudes midline without fasciculations.   Motor: 5/5 strength in bilateral upper extremities 3/5 strength in RLE, limited due to pain 5/5 strength in LLE Tone is normal. Bulk is normal.  Sensation: Intact to light touch bilaterally in all four extremities. No extinction to DSS present.  Coordination: FTN intact bilaterally. No pronator drift. Alternating hand movements.  DTRs: 2+ throughout.  Gait: Deferred  NIHSS: 1a Level of Conscious.: 0 1b LOC Questions: 0 1c LOC Commands: 0 2 Best Gaze: 0 3 Visual: 0 4 Facial Palsy: 0 5a Motor Arm - left: 0 5b Motor Arm - Right: 0 6a Motor Leg - Left: 0 6b Motor Leg - Right: 1 7 Limb Ataxia: 0 8 Sensory: 0 9 Best Language: 0 10 Dysarthria: 1 11 Extinct. and Inatten.: 0 TOTAL: 2   Labs I have reviewed labs in epic and the results pertinent to this consultation are:   CBC    Component Value Date/Time   WBC 6.2 12/28/2021 1429   RBC 4.23 12/28/2021 1429   HGB 13.4 12/28/2021 1429   HCT 41.1 12/28/2021 1429   PLT 215 12/28/2021 1429   MCV 97.2 12/28/2021 1429   MCH 31.7 12/28/2021 1429   MCHC 32.6 12/28/2021 1429   RDW 13.1 12/28/2021 1429   LYMPHSABS 1.5 12/28/2021 1429   MONOABS 0.5 12/28/2021 1429   EOSABS 0.3 12/28/2021 1429   BASOSABS 0.0 12/28/2021 1429    CMP     Component Value Date/Time   NA 140 12/28/2021 1429   K 4.0 12/28/2021 1429   CL 106 12/28/2021 1429   CO2 25 12/28/2021 1429   GLUCOSE 84 12/28/2021 1429   BUN 23 12/28/2021 1429   CREATININE 1.57 (H) 12/28/2021 1429   CALCIUM 9.3 12/28/2021 1429   PROT 6.9 12/28/2021 1429   ALBUMIN 3.9 12/28/2021 1429   AST 16 12/28/2021 1429   ALT 8 12/28/2021 1429   ALKPHOS 48 12/28/2021 1429   BILITOT 0.6 12/28/2021 1429   GFRNONAA 32 (L)  12/28/2021 1429   GFRAA 43 (L) 07/07/2012 2017    Lipid Panel  No results found for: CHOL, TRIG, HDL, CHOLHDL, VLDL, LDLCALC, LDLDIRECT  UA negative   Imaging I have reviewed the images obtained: CT-scan of the brain- no acute abnormality MRI examination of the brain- Small acute infarct of the left corona radiata.Moderate chronic microvascular ischemic changes. Mild burden of chronic microhemorrhages secondary to hypertension. MRA head and Neck- Negative for LVO and flow limiting stenosis  Assessment: 88 year old female with [name] PMH of CKDIV, osteoporosis, HLD, squamous cell carcinoma, glaucoma, colon cancer, and hypertension an acute onset of dysarthria, confusion, and lethargy. Admitted to [facility] for stroke work up. MRI shows an acute infarct in  the left corona radiata   Impression: Small acute infarct of the left corona radiata likely related to small vessel disease   Recommendations: - HgbA1c, fasting lipid panel - MRA of the head and neck without contrast - Frequent neuro checks - Echocardiogram - Prophylactic therapy-Antiplatelet med: ASA '81mg'$  and Plavix '75mg'$  - Risk factor modification - Telemetry monitoring - PT consult, OT consult, Speech consult - Stroke team to follow  Patient seen and examined by NP/APP with MD. MD to update note as needed.   Janine Ores, DNP, FNP-BC [facility] Pager: [phone]  I have seen the patient and reviewed the above note.  My suspicion is this likely represents [name] small vessel ischemic stroke.  She will need dual antiplatelet therapy and high intensity statin therapy.  Continue telemetry monitoring overnight, PT, OT, ST.  Stroke team to follow.  Roland Rack, MD [facility] [phone]  If 7pm- 7am, please page neurology on call as listed in [place].

## 2021-12-29 NOTE — ED Notes (Signed)
MD spoke with pt and family. Agreeable to stay and wait for bed assignment at this time.

## 2021-12-29 NOTE — Progress Notes (Deleted)
While helping patient's to go to the bathroom, she seems confused about her situation and condition.

## 2021-12-29 NOTE — Progress Notes (Signed)
VASCULAR LAB    Bilateral lower extremity venous duplex has been performed.  See CV proc for preliminary results.   Montavis Schubring, RVT 12/29/2021, 12:41 PM

## 2021-12-30 DIAGNOSIS — I639 Cerebral infarction, unspecified: Secondary | ICD-10-CM

## 2021-12-30 DIAGNOSIS — Z885 Allergy status to narcotic agent status: Secondary | ICD-10-CM | POA: Diagnosis not present

## 2021-12-30 DIAGNOSIS — I444 Left anterior fascicular block: Secondary | ICD-10-CM | POA: Diagnosis not present

## 2021-12-30 DIAGNOSIS — Z7952 Long term (current) use of systemic steroids: Secondary | ICD-10-CM | POA: Diagnosis not present

## 2021-12-30 DIAGNOSIS — H409 Unspecified glaucoma: Secondary | ICD-10-CM | POA: Diagnosis present

## 2021-12-30 DIAGNOSIS — M79604 Pain in right leg: Secondary | ICD-10-CM | POA: Diagnosis not present

## 2021-12-30 DIAGNOSIS — I6381 Other cerebral infarction due to occlusion or stenosis of small artery: Secondary | ICD-10-CM | POA: Diagnosis present

## 2021-12-30 DIAGNOSIS — I10 Essential (primary) hypertension: Secondary | ICD-10-CM | POA: Diagnosis not present

## 2021-12-30 DIAGNOSIS — E785 Hyperlipidemia, unspecified: Secondary | ICD-10-CM | POA: Diagnosis present

## 2021-12-30 DIAGNOSIS — N184 Chronic kidney disease, stage 4 (severe): Secondary | ICD-10-CM

## 2021-12-30 DIAGNOSIS — Z85038 Personal history of other malignant neoplasm of large intestine: Secondary | ICD-10-CM | POA: Diagnosis not present

## 2021-12-30 DIAGNOSIS — Z87891 Personal history of nicotine dependence: Secondary | ICD-10-CM | POA: Diagnosis not present

## 2021-12-30 DIAGNOSIS — Z85828 Personal history of other malignant neoplasm of skin: Secondary | ICD-10-CM | POA: Diagnosis not present

## 2021-12-30 DIAGNOSIS — Z881 Allergy status to other antibiotic agents status: Secondary | ICD-10-CM | POA: Diagnosis not present

## 2021-12-30 DIAGNOSIS — R4182 Altered mental status, unspecified: Secondary | ICD-10-CM | POA: Diagnosis present

## 2021-12-30 DIAGNOSIS — Z888 Allergy status to other drugs, medicaments and biological substances status: Secondary | ICD-10-CM | POA: Diagnosis not present

## 2021-12-30 DIAGNOSIS — M1990 Unspecified osteoarthritis, unspecified site: Secondary | ICD-10-CM | POA: Diagnosis present

## 2021-12-30 DIAGNOSIS — Z833 Family history of diabetes mellitus: Secondary | ICD-10-CM | POA: Diagnosis not present

## 2021-12-30 DIAGNOSIS — R471 Dysarthria and anarthria: Secondary | ICD-10-CM | POA: Diagnosis present

## 2021-12-30 DIAGNOSIS — I129 Hypertensive chronic kidney disease with stage 1 through stage 4 chronic kidney disease, or unspecified chronic kidney disease: Secondary | ICD-10-CM | POA: Diagnosis present

## 2021-12-30 DIAGNOSIS — Z803 Family history of malignant neoplasm of breast: Secondary | ICD-10-CM | POA: Diagnosis not present

## 2021-12-30 DIAGNOSIS — Z20822 Contact with and (suspected) exposure to covid-19: Secondary | ICD-10-CM | POA: Diagnosis present

## 2021-12-30 DIAGNOSIS — M810 Age-related osteoporosis without current pathological fracture: Secondary | ICD-10-CM | POA: Diagnosis present

## 2021-12-30 DIAGNOSIS — Z800 Family history of malignant neoplasm of digestive organs: Secondary | ICD-10-CM | POA: Diagnosis not present

## 2021-12-30 DIAGNOSIS — R29702 NIHSS score 2: Secondary | ICD-10-CM | POA: Diagnosis present

## 2021-12-30 LAB — URINE CULTURE

## 2021-12-30 MED ORDER — ASPIRIN 81 MG PO TBEC: 81.0000 mg | DELAYED_RELEASE_TABLET | Freq: Every day | ORAL | 12 refills | Status: AC

## 2021-12-30 MED ORDER — CLOPIDOGREL BISULFATE 75 MG PO TABS: 75.0000 mg | ORAL_TABLET | Freq: Every day | ORAL | 0 refills | Status: DC

## 2021-12-30 MED ORDER — ATORVASTATIN CALCIUM 40 MG PO TABS: 40.0000 mg | ORAL_TABLET | Freq: Every day | ORAL | 11 refills | Status: AC

## 2021-12-30 NOTE — Evaluation (Signed)
Occupational Therapy Evaluation Patient Details Name: MAGHEN GROUP MRN: [id number] DOB: 10-21-1931 Today's Date: 12/30/2021   History of Present Illness Pt is an 88 y/o F presenting to ED on 6/4 with lethargy, slurred speech, and RUE weakness. CT negative. MRI revealing small corona radiata infarct. PMH includes HTN, CKD III/IV, arthritis, SBO s/p L hemicolectomy.   Clinical Impression   Pt reports independence at baseline with ADLs/functional mobility. Son reports that pt has cane and wears glasses at baseline, however does not use/wear. Per son, pt will have 24/7 supervision at d/c. Educated pt on use of shower chair for safety, pt/family verbalized understanding. Pt min A for ADLs and transfers with RW, pt with posterior lean and difficulty  navigating small spaces with RW. Pt presenting with impairments listed below, will follow acutely. Recommend HHOT at d/c.      Recommendations for follow up therapy are one component of a multi-disciplinary discharge planning process, led by the attending physician.  Recommendations may be updated based on patient status, additional functional criteria and insurance authorization.   Follow Up Recommendations  Home health OT    Assistance Recommended at Discharge Frequent or constant Supervision/Assistance  Patient can return home with the following A little help with walking and/or transfers;A little help with bathing/dressing/bathroom;Assistance with cooking/housework;Direct supervision/assist for medications management;Direct supervision/assist for financial management;Help with stairs or ramp for entrance;Assist for transportation    Functional Status Assessment  Patient has had a recent decline in their functional status and demonstrates the ability to make significant improvements in function in a reasonable and predictable amount of time.  Equipment Recommendations  BSC/3in1; Other (comment) RW   Recommendations for Other Services PT  consult     Precautions / Restrictions Precautions Precautions: Fall Restrictions Weight Bearing Restrictions: No      Mobility Bed Mobility               General bed mobility comments: up in chair upon arrival    Transfers Overall transfer level: Needs assistance Equipment used: Rolling walker (2 wheels) Transfers: Sit to/from Stand Sit to Stand: Min assist                  Balance Overall balance assessment: Needs assistance Sitting-balance support: Feet supported, Bilateral upper extremity supported Sitting balance-Leahy Scale: Good Sitting balance - Comments: can reach outside BOS without LOB   Standing balance support: During functional activity, Reliant on assistive device for balance Standing balance-Leahy Scale: Poor Standing balance comment: reliant on external support                           ADL either performed or assessed with clinical judgement   ADL Overall ADL's : Needs assistance/impaired Eating/Feeding: Modified independent;Sitting   Grooming: Wash/dry hands;Minimal assistance;Standing Grooming Details (indicate cue type and reason): cues to find soap Upper Body Bathing: Minimal assistance;Sitting   Lower Body Bathing: Minimal assistance;Sit to/from stand;Sitting/lateral leans   Upper Body Dressing : Minimal assistance;Standing;Sitting   Lower Body Dressing: Minimal assistance;Sit to/from stand;Sitting/lateral leans Lower Body Dressing Details (indicate cue type and reason): to pull up socks Toilet Transfer: Minimal assistance;Rolling walker (2 wheels);Regular Toilet;Ambulation Toilet Transfer Details (indicate cue type and reason): posterior lean, difficulty with turning/small spaces with RW Toileting- Clothing Manipulation and Hygiene: Supervision/safety;Sitting/lateral lean Toileting - Clothing Manipulation Details (indicate cue type and reason): for pericare     Functional mobility during ADLs: Minimal  assistance;Rolling walker (2 wheels);Cueing for sequencing;Cueing for safety  Vision   Vision Assessment?: Yes Eye Alignment: Within Functional Limits Ocular Range of Motion: Within Functional Limits Alignment/Gaze Preference: Within Defined Limits Visual Fields: Impaired-to be further tested in functional context Additional Comments: pt able to identify stimulus in different quadrants of visual field, pt with difficulty following instructions for assesssment, looking at stimulus instead of looking ahead. Will further assess     Perception Perception Comments: R inattention noted   Praxis      Pertinent Vitals/Pain Pain Assessment Pain Assessment: No/denies pain     Hand Dominance     Extremity/Trunk Assessment Upper Extremity Assessment Upper Extremity Assessment: Generalized weakness (R>L weakness, both WFL, R (3+/5) and L (4/5))   Lower Extremity Assessment Lower Extremity Assessment: Defer to PT evaluation   Cervical / Trunk Assessment Cervical / Trunk Assessment: Normal   Communication Communication Communication: No difficulties   Cognition Arousal/Alertness: Awake/alert Behavior During Therapy: WFL for tasks assessed/performed Overall Cognitive Status: Impaired/Different from baseline Area of Impairment: Attention, Following commands, Safety/judgement, Memory, Awareness                   Current Attention Level: Focused Memory: Decreased short-term memory Following Commands: Follows one step commands with increased time Safety/Judgement: Decreased awareness of safety, Decreased awareness of deficits (R inattention) Awareness: Intellectual   General Comments: able to complete functional cognitive tasks, difficulty with navigating room     General Comments  VSS on RA, son in room    Exercises     Shoulder Clarence expects to be discharged to:: Private residence Living Arrangements: Children Available  Help at Discharge: Family;Available 24 hours/day Type of Home: House Home Access: Stairs to enter [employer] of Steps: 2   Home Layout: Two level;Able to live on main level with bedroom/bathroom     Bathroom Shower/Tub: Walk-in shower         Home Equipment: Kasandra Knudsen - single point   Additional Comments: family members alternating times taking care of pt  Lives With: Son    Prior Functioning/Environment Prior Level of Function : Independent/Modified Independent             Mobility Comments: has cane, but does not use per son ADLs Comments: does IADLs        OT Problem List: Decreased strength;Decreased range of motion;Decreased activity tolerance;Impaired balance (sitting and/or standing);Decreased knowledge of use of DME or AE;Decreased safety awareness;Impaired vision/perception      OT Treatment/Interventions: Self-care/ADL training;Therapeutic exercise;Therapeutic activities;Patient/family education;Balance training;Visual/perceptual remediation/compensation;Cognitive remediation/compensation    OT Goals(Current goals can be found in the care plan section) Acute Rehab OT Goals Patient Stated Goal: none stated OT Goal Formulation: With patient Time For Goal Achievement: 01/13/22 Potential to Achieve Goals: Good ADL Goals Pt Will Perform Upper Body Dressing: with modified independence;sitting;standing Pt Will Perform Lower Body Dressing: with modified independence;sitting/lateral leans;sit to/from stand Pt Will Transfer to Toilet: ambulating;regular height toilet;with supervision Pt Will Perform Tub/Shower Transfer: Shower transfer;ambulating;shower seat;rolling walker;with supervision Additional ADL Goal #1: Pt will be able to complete 3 step trailmaking task accurately in order to perform ADLs  OT Frequency: Min 3X/week    Co-evaluation              AM-PAC OT "6 Clicks" Daily Activity     Outcome Measure Help from another person eating meals?:  None Help from another person taking care of personal grooming?: A Little Help from another person toileting, which includes using toliet, bedpan, or urinal?: A  Lot Help from another person bathing (including washing, rinsing, drying)?: A Lot Help from another person to put on and taking off regular upper body clothing?: A Little Help from another person to put on and taking off regular lower body clothing?: A Little 6 Click Score: 17   End of Session Equipment Utilized During Treatment: Gait belt;Rolling walker (2 wheels) Nurse Communication: Mobility status  Activity Tolerance: Patient tolerated treatment well Patient left: in chair;with call bell/phone within reach;with family/visitor present  OT Visit Diagnosis: Unsteadiness on feet (R26.81);Other abnormalities of gait and mobility (R26.89);Muscle weakness (generalized) (M62.81);Other symptoms and signs involving cognitive function                Time: 5277-8242 OT Time Calculation (min): 22 min Charges:  OT General Charges $OT Visit: 1 Visit OT Evaluation $OT Eval Moderate Complexity: 1 [address] OTD, OTR/L Acute Rehab [phone]) [phone] - [place] 12/30/2021, 9:50 AM

## 2021-12-30 NOTE — Discharge Summary (Signed)
Physician Discharge Summary   Patient: Laurie [name] MRN: [id number] DOB: 17-Aug-1931  Admit date:     12/28/2021  Discharge date: 12/30/21  Discharge Physician: Edwin Dada   PCP: Pcp, No     Recommendations at discharge:  Follow up with [facility] in 6-8 weeks for new stroke     Discharge Diagnoses: Principal Problem:   Acute ischemic stroke [facility]) Active Problems:   CKD (chronic kidney disease), stage IV ([place])   Glaucoma   Essential hypertension        Hospital Course: [name] is an 88 y.o. F with CKD IV, HTN, hx SBO s/p L hemicolectomy who prseneted with slurred speech and generalized weakness.   In the ER, BP >188 systolic, CTH normal.    MRI brain was obtained that showed 1cm LEFT corona radiata stroke.  Non-invasive angiography showed <50% carotids and no significant intracranial stenoses.  Echocardiogram showed no cardiogenic source of embolism  Lipids ordered: discharged on new atorvastatin 40 mg nightly  Aspirin ordered at admission --> discharged on aspirin and Plavix for 3 weeks then aspirin alone indefinitely  Atrial fibrillation: none on monitoring.  tPA not given because unknown last well.  Dysphagia screen ordered in ER.  PT eval ordered: recommended HHPT.  Nonsmoker            The [facility] Controlled Substances Registry was reviewed for this patient prior to discharge.   Consultants: Neurology Procedures performed: Echo, MRI brain, MRA head and neck  Disposition: Home health Diet recommendation:  Discharge Diet Orders (From admission, onward)     Start     Ordered   12/30/21 0000  Diet - low sodium heart healthy        12/30/21 1356             DISCHARGE MEDICATION: Allergies as of 12/30/2021       Reactions   Actonel [risedronate] Nausea And Vomiting   Fosamax [alendronate] Nausea And Vomiting   Miacalcin Other (See Comments)   Unknown reaction   Neomycin Other (See Comments)    Unknown reaction   Neosporin [neomycin-polymyxin-gramicidin] Swelling   Neurontin [gabapentin] Other (See Comments)   Confusion   Codeine Palpitations        Medication List     TAKE these medications    acetaminophen 500 MG tablet Commonly known as: TYLENOL Take 1,000 mg by mouth every 6 (six) hours as needed.   aspirin EC 81 MG tablet Take 1 tablet (81 mg total) by mouth daily. Swallow whole. Start taking on: December 31, 2021   atorvastatin 40 MG tablet Commonly known as: LIPITOR Take 1 tablet (40 mg total) by mouth daily. Start taking on: December 31, 2021   brimonidine 0.2 % ophthalmic solution Commonly known as: ALPHAGAN Place 1 drop into both eyes 3 (three) times daily.   clopidogrel 75 MG tablet Commonly known as: PLAVIX Take 1 tablet (75 mg total) by mouth daily. Start taking on: December 31, 2021   dorzolamide-timolol 22.3-6.8 MG/ML ophthalmic solution Commonly known as: COSOPT 1 drop 2 (two) times daily.   fluorouracil 5 % cream Commonly known as: EFUDEX Apply 1 application. topically daily.   gabapentin 100 MG capsule Commonly known as: NEURONTIN Take 100 mg by mouth 3 (three) times daily.   latanoprost 0.005 % ophthalmic solution Commonly known as: XALATAN Place 1 drop into both eyes every evening.   OPTIVE 0.5-0.9 % ophthalmic solution Generic drug: carboxymethylcellul-glycerin Place 1 drop into both eyes 4 (  four) times daily as needed for dry eyes.   PreserVision AREDS 2 Caps Take 1 tablet by mouth 2 (two) times daily.   Rhopressa 0.02 % Soln Generic drug: Netarsudil Dimesylate Place 1 drop into both eyes every evening.   Rocklatan 0.02-0.005 % Soln Generic drug: Netarsudil-Latanoprost Place 1 drop into both eyes.   Travoprost (BAK Free) 0.004 % Soln ophthalmic solution Commonly known as: TRAVATAN Place 1 drop into both eyes at bedtime.   valACYclovir 500 MG tablet Commonly known as: VALTREX Take 500 mg by mouth in the morning and at  bedtime.        Follow-up Information     [facility] [place] Follow up.   Specialty: Home Health Services Why: The home health agency will contact you for the first home visit. Contact information: [address] [place] [address] [phone]         [facility] Follow up.   Specialty: Neurology Contact information: [address] [place] [phone]                Discharge Instructions     Ambulatory referral to Neurology   Complete by: As directed    An appointment is requested in approximately: 8 weeks For stroke   Diet - low sodium heart healthy   Complete by: As directed    Discharge instructions   Complete by: As directed    From Dr. Loleta Books: You were admitted for slurred speech and looking weak. Here, we found that you had a small 1cm stroke in the left corona radiata  You had an echocardiogram (ultrasound of the heart) that was normal You had imaging of the arteries of the neck that was normal  You should take atorvastatin/Lipitor nightly from now on (this is a cholesterol medicine) Take it at night  You should take aspirin also from now on, aspirin 81 mg daily  For the next three weeks, you should take clopidogrel/Plavix 75 mg daily This is like a "super aspirin" and like aspirin, it prevents platelets (clotting cells in your blood) from sticking together to form blockages  Go see the Neurologist in 8 weeks They will contact you for an appointment   Increase activity slowly   Complete by: As directed        Discharge Exam: Filed Weights   12/29/21 0943  Weight: 58.5 kg    General: Pt is alert, awake, not in acute distress, sitting up in chair, in soft neck collar Cardiovascular: RRR, nl S1-S2, no murmurs appreciated.   No LE edema.   Respiratory: Normal respiratory rate and rhythm.  CTAB without rales or wheezes. Abdominal: Abdomen soft and non-tender.  No  distension or HSM.   Neuro/Psych: Strength symmetric in upper and lower extremities.  Judgment and insight appear normal.   Condition at discharge: Good  The results of significant diagnostics from this hospitalization (including imaging, microbiology, ancillary and laboratory) are listed below for reference.   Imaging Studies: CT HEAD WO CONTRAST  Result Date: 12/28/2021 CLINICAL DATA:  Slurred speech EXAM: CT HEAD WITHOUT CONTRAST TECHNIQUE: Contiguous axial images were obtained from the base of the skull through the vertex without intravenous contrast. RADIATION DOSE REDUCTION: This exam was performed according to the departmental dose-optimization program which includes automated exposure control, adjustment of the mA and/or kV according to patient size and/or use of iterative reconstruction technique. COMPARISON:  None Available. FINDINGS: Brain: No evidence of acute infarction, hemorrhage, cerebral edema,  mass, mass effect, or midline shift. No hydrocephalus or extra-axial fluid collection. Periventricular white matter changes, likely the sequela of chronic small vessel ischemic disease. Vascular: No hyperdense vessel. Atherosclerotic calcifications in the intracranial carotid and vertebral arteries. Skull: Normal. Negative for fracture or focal lesion. Sinuses/Orbits: Complete opacification of the right maxillary sinus, likely chronic sinusitis. Additional mild mucosal thickening in the ethmoid air cells. Status post bilateral lens replacements. Other: The mastoid air cells are well aerated. IMPRESSION: No acute intracranial process. Electronically Signed   By: Merilyn Baba M.D.   On: 12/28/2021 15:05   MR ANGIO HEAD WO CONTRAST  Result Date: 12/29/2021 CLINICAL DATA:  Stroke, follow-up. Hypertension. Chronic renal disease. Slurred speech and lethargy. Left brain white matter infarction shown by MRI earlier today. EXAM: MRA NECK WITHOUT CONTRAST MRA HEAD WITHOUT CONTRAST TECHNIQUE: Angiographic  images of the Circle of Willis were acquired using MRA technique without intravenous contrast. COMPARISON:  MRI same day FINDINGS: MRA NECK FINDINGS Branching pattern is normal. No origin stenosis is seen. Both common carotid arteries are widely patent to their respective bifurcation. Both carotid bifurcations are patent without stenosis or irregularity. Both cervical internal carotid arteries are widely patent. Both vertebral arteries show antegrade flow with the left being dominant. MRA HEAD FINDINGS Both internal carotid arteries are widely patent into the brain. No siphon stenosis. The anterior and middle cerebral vessels are patent without proximal stenosis, aneurysm or vascular malformation. Both vertebral arteries are widely patent through the skull base. Right vertebral artery terminates in PICA. Left vertebral artery supplies the basilar. No basilar stenosis. Posterior circulation branch vessels appear normal. IMPRESSION: Negative noncontrast MR angiography of the neck vessels and the intracranial vasculature. No carotid stenosis. No intracranial large vessel occlusion. Electronically Signed   By: Nelson Chimes M.D.   On: 12/29/2021 14:14   MR ANGIO NECK WO CONTRAST  Result Date: 12/29/2021 CLINICAL DATA:  Stroke, follow-up. Hypertension. Chronic renal disease. Slurred speech and lethargy. Left brain white matter infarction shown by MRI earlier today. EXAM: MRA NECK WITHOUT CONTRAST MRA HEAD WITHOUT CONTRAST TECHNIQUE: Angiographic images of the Circle of Willis were acquired using MRA technique without intravenous contrast. COMPARISON:  MRI same day FINDINGS: MRA NECK FINDINGS Branching pattern is normal. No origin stenosis is seen. Both common carotid arteries are widely patent to their respective bifurcation. Both carotid bifurcations are patent without stenosis or irregularity. Both cervical internal carotid arteries are widely patent. Both vertebral arteries show antegrade flow with the left being  dominant. MRA HEAD FINDINGS Both internal carotid arteries are widely patent into the brain. No siphon stenosis. The anterior and middle cerebral vessels are patent without proximal stenosis, aneurysm or vascular malformation. Both vertebral arteries are widely patent through the skull base. Right vertebral artery terminates in PICA. Left vertebral artery supplies the basilar. No basilar stenosis. Posterior circulation branch vessels appear normal. IMPRESSION: Negative noncontrast MR angiography of the neck vessels and the intracranial vasculature. No carotid stenosis. No intracranial large vessel occlusion. Electronically Signed   By: Nelson Chimes M.D.   On: 12/29/2021 14:14   MR BRAIN WO CONTRAST  Result Date: 12/29/2021 CLINICAL DATA:  Stroke, follow up EXAM: MRI HEAD WITHOUT CONTRAST TECHNIQUE: Multiplanar, multiecho pulse sequences of the brain and surrounding structures were obtained without intravenous contrast. COMPARISON:  None Available. FINDINGS: Brain: 1 cm focus of reduced diffusion within the left corona radiata. Few small foci of susceptibility involving the deep gray nuclei likely reflecting hypertension related chronic microhemorrhages. Probable small chronic right  occipital infarct. Patchy and confluent areas of T2 hyperintensity in the supratentorial greater than pontine white matter are nonspecific but may reflect moderate chronic microvascular ischemic changes. Prominence of the ventricles and sulci reflects generalized parenchymal volume loss. Vascular: Major vessel flow voids at the skull base are preserved. Skull and upper cervical spine: Normal marrow signal is preserved. Sinuses/Orbits: Chronic right maxillary sinusitis. Bilateral lens replacements. Other: Sella is unremarkable.  Minimal left mastoid opacification. IMPRESSION: Small acute infarct of the left corona radiata. Moderate chronic microvascular ischemic changes. Mild burden of chronic microhemorrhages secondary to hypertension.  Electronically Signed   By: Macy Mis M.D.   On: 12/29/2021 11:27   DG Chest Port 1 View  Result Date: 12/28/2021 CLINICAL DATA:  Cough.  Slurred speech. EXAM: PORTABLE CHEST 1 VIEW COMPARISON:  07/08/2012 FINDINGS: Numerous leads and wires project over the chest. Midline trachea. Moderate cardiomegaly. Atherosclerosis in the transverse aorta. No pleural effusion or pneumothorax. Moderate pulmonary interstitial thickening, likely related to remote smoking history and/or chronic bronchitis. No lobar consolidation. No congestive failure. IMPRESSION: 1. No acute cardiopulmonary disease. 2. Cardiomegaly without congestive failure. Electronically Signed   By: Abigail Miyamoto M.D.   On: 12/28/2021 18:26   ECHOCARDIOGRAM COMPLETE  Result Date: 12/29/2021    ECHOCARDIOGRAM REPORT   Patient Name:   SHYANE FOSSUM Date of Exam: 12/29/2021 Medical Rec #:  [id number]          Height:       59.0 in Accession #:    [id number]         Weight:       129.0 lb Date of Birth:  [birth year]          BSA:          1.531 m Patient Age:    65 years           BP:           151/72 mmHg Patient Gender: F                  HR:           87 bpm. Exam Location:  Inpatient Procedure: 2D Echo, Cardiac Doppler and Color Doppler Indications:    TIA  History:        Patient has no prior history of Echocardiogram examinations.  Sonographer:    Jefferey Pica Referring Phys: [id number] RONDELL A SMITH IMPRESSIONS  1. Left ventricular ejection fraction, by estimation, is 60 to 65%. The left ventricle has normal function. The left ventricle has no regional wall motion abnormalities. There is mild concentric left ventricular hypertrophy. Left ventricular diastolic parameters were normal.  2. Right ventricular systolic function is normal. The right ventricular size is normal. Tricuspid regurgitation signal is inadequate for assessing PA pressure.  3. The mitral valve is normal in structure. Mild mitral valve regurgitation. No evidence of mitral  stenosis.  4. The aortic valve is tricuspid. Aortic valve regurgitation is not visualized. Aortic valve sclerosis is present, with no evidence of aortic valve stenosis.  5. The inferior vena cava is normal in size with greater than 50% respiratory variability, suggesting right atrial pressure of 3 mmHg. FINDINGS  Left Ventricle: Left ventricular ejection fraction, by estimation, is 60 to 65%. The left ventricle has normal function. The left ventricle has no regional wall motion abnormalities. The left ventricular internal cavity size was small. There is mild concentric left ventricular hypertrophy. Left ventricular diastolic parameters were normal. Right Ventricle: The right ventricular  size is normal. No increase in right ventricular wall thickness. Right ventricular systolic function is normal. Tricuspid regurgitation signal is inadequate for assessing PA pressure. Left Atrium: Left atrial size was normal in size. Right Atrium: Right atrial size was normal in size. Pericardium: There is no evidence of pericardial effusion. Presence of epicardial fat layer. Mitral Valve: The mitral valve is normal in structure. Mild mitral valve regurgitation. No evidence of mitral valve stenosis. Tricuspid Valve: The tricuspid valve is normal in structure. Tricuspid valve regurgitation is not demonstrated. No evidence of tricuspid stenosis. Aortic Valve: The aortic valve is tricuspid. Aortic valve regurgitation is not visualized. Aortic valve sclerosis is present, with no evidence of aortic valve stenosis. Aortic valve peak gradient measures 6.5 mmHg. Pulmonic Valve: The pulmonic valve was normal in structure. Pulmonic valve regurgitation is not visualized. No evidence of pulmonic stenosis. Aorta: The aortic root is normal in size and structure. Venous: The inferior vena cava is normal in size with greater than 50% respiratory variability, suggesting right atrial pressure of 3 mmHg. IAS/Shunts: No atrial level shunt detected by  color flow Doppler.  LEFT VENTRICLE PLAX 2D LVIDd:         2.80 cm   Diastology LVIDs:         1.90 cm   LV e' lateral:   8.70 cm/s LV PW:         1.00 cm   LV E/e' lateral: 7.7 LV IVS:        1.40 cm LVOT diam:     2.00 cm LV SV:         62 LV SV Index:   40 LVOT Area:     3.14 cm  IVC IVC diam: 1.20 cm LEFT ATRIUM           Index LA diam:      2.30 cm 1.50 cm/m LA Vol (A2C): 18.2 ml 11.89 ml/m LA Vol (A4C): 34.7 ml 22.67 ml/m  AORTIC VALVE                 PULMONIC VALVE AV Area (Vmax): 2.43 cm     PV Vmax:       0.74 m/s AV Vmax:        127.50 cm/s  PV Peak grad:  2.2 mmHg AV Peak Grad:   6.5 mmHg LVOT Vmax:      98.70 cm/s LVOT Vmean:     59.500 cm/s LVOT VTI:       0.196 m  AORTA Ao Root diam: 3.00 cm Ao Asc diam:  2.90 cm MITRAL VALVE MV Area (PHT): 2.91 cm    SHUNTS MV Decel Time: 261 msec    Systemic VTI:  0.20 m MV E velocity: 67.40 cm/s  Systemic Diam: 2.00 cm MV A velocity: 97.90 cm/s MV E/A ratio:  0.69 Kardie Tobb DO Electronically signed by Berniece Salines DO Signature Date/Time: 12/29/2021/4:59:03 PM    Final    VAS [place] LOWER EXTREMITY VENOUS (DVT)  Result Date: 12/29/2021  Lower Venous DVT Study Patient Name:  AN LANNAN  Date of Exam:   12/29/2021 Medical Rec #: [id number]           Accession #:    [id number] Date of Birth: Feb 27, 1932           Patient Gender: F Patient Age:   68 years Exam Location:  [facility] Procedure:      VAS [place] LOWER EXTREMITY VENOUS (DVT) Referring Phys: Fuller Plan --------------------------------------------------------------------------------  Indications:  Pain, and stroke.  Comparison Study: No prior study on file Performing Technologist: Sharion Dove RVS  Examination Guidelines: A complete evaluation includes B-mode imaging, spectral Doppler, color Doppler, and power Doppler as needed of all accessible portions of each vessel. Bilateral testing is considered an integral part of a complete examination. Limited examinations for reoccurring indications  may be performed as noted. The reflux portion of the exam is performed with the patient in reverse Trendelenburg.  +---------+---------------+---------+-----------+----------+--------------+ RIGHT    CompressibilityPhasicitySpontaneityPropertiesThrombus Aging +---------+---------------+---------+-----------+----------+--------------+ CFV      Full           Yes      Yes                                 +---------+---------------+---------+-----------+----------+--------------+ SFJ      Full                                                        +---------+---------------+---------+-----------+----------+--------------+ FV Prox  Full                                                        +---------+---------------+---------+-----------+----------+--------------+ FV Mid   Full                                                        +---------+---------------+---------+-----------+----------+--------------+ FV DistalFull                                                        +---------+---------------+---------+-----------+----------+--------------+ PFV      Full                                                        +---------+---------------+---------+-----------+----------+--------------+ POP      Full           Yes      Yes                                 +---------+---------------+---------+-----------+----------+--------------+ PTV      Full                                                        +---------+---------------+---------+-----------+----------+--------------+ PERO     Full                                                        +---------+---------------+---------+-----------+----------+--------------+   +---------+---------------+---------+-----------+----------+--------------+  LEFT     CompressibilityPhasicitySpontaneityPropertiesThrombus Aging +---------+---------------+---------+-----------+----------+--------------+ CFV       Full           Yes      Yes                                 +---------+---------------+---------+-----------+----------+--------------+ SFJ      Full                                                        +---------+---------------+---------+-----------+----------+--------------+ FV Prox  Full                                                        +---------+---------------+---------+-----------+----------+--------------+ FV Mid   Full                                                        +---------+---------------+---------+-----------+----------+--------------+ FV DistalFull                                                        +---------+---------------+---------+-----------+----------+--------------+ PFV      Full                                                        +---------+---------------+---------+-----------+----------+--------------+ POP      Full           Yes      Yes                                 +---------+---------------+---------+-----------+----------+--------------+ PTV      Full                                                        +---------+---------------+---------+-----------+----------+--------------+ PERO     Full                                                        +---------+---------------+---------+-----------+----------+--------------+     Summary: BILATERAL: - No evidence of deep vein thrombosis seen in the lower extremities, bilaterally. -No evidence of popliteal cyst, bilaterally.   *See table(s) above for measurements and observations. Electronically signed by Orlie Pollen on 12/29/2021 at 7:02:24 PM.  Final     Microbiology: Results for orders placed or performed during the hospital encounter of 12/28/21  SARS Coronavirus 2 by RT PCR (hospital order, performed in [facility] hospital lab) *cepheid single result test* Anterior Nasal Swab     Status: None   Collection Time: 12/28/21  6:39 PM   Specimen:  Anterior Nasal Swab  Result Value Ref Range Status   SARS Coronavirus 2 by RT PCR NEGATIVE NEGATIVE Final    Comment: (NOTE) SARS-CoV-2 target nucleic acids are NOT DETECTED.  The SARS-CoV-2 RNA is generally detectable in upper and lower respiratory specimens during the acute phase of infection. The lowest concentration of SARS-CoV-2 viral copies this assay can detect is 250 copies / mL. A negative result does not preclude SARS-CoV-2 infection and should not be used as the sole basis for treatment or other patient management decisions.  A negative result may occur with improper specimen collection / handling, submission of specimen other than nasopharyngeal swab, presence of viral mutation(s) within the areas targeted by this assay, and inadequate number of viral copies (<250 copies / mL). A negative result must be combined with clinical observations, patient history, and epidemiological information.  Fact Sheet for Patients:   https://www.patel.info/  Fact Sheet for Healthcare Providers: https://hall.com/  This test is not yet approved or  cleared by the [place] FDA and has been authorized for detection and/or diagnosis of SARS-CoV-2 by FDA under an Emergency Use Authorization (EUA).  This EUA will remain in effect (meaning this test can be used) for the duration of the COVID-19 declaration under Section 564(b)(1) of the Act, 21 U.S.C. section 360bbb-3(b)(1), unless the authorization is terminated or revoked sooner.  Performed at [facility], [address] [place], [place] [address]     Labs: CBC: Recent Labs  Lab 12/28/21 1429 12/29/21 1455  WBC 6.2 7.7  NEUTROABS 3.9  --   HGB 13.4 15.3*  HCT 41.1 45.2  MCV 97.2 96.0  PLT 215 573   Basic Metabolic Panel: Recent Labs  Lab 12/28/21 1429 12/29/21 1455  NA 140 140  K 4.0 3.8  CL 106 109  CO2 25 22  GLUCOSE 84 146*  BUN 23 22  CREATININE  1.57* 1.55*  CALCIUM 9.3 9.3   Liver Function Tests: Recent Labs  Lab 12/28/21 1429  AST 16  ALT 8  ALKPHOS 48  BILITOT 0.6  PROT 6.9  ALBUMIN 3.9   CBG: Recent Labs  Lab 12/28/21 1752  GLUCAP 84    Discharge time spent: approximately 25 minutes spent on discharge counseling, evaluation of patient on day of discharge, and coordination of discharge planning with nursing, social work, pharmacy and case management  Signed: Edwin Dada, MD [facility] 12/30/2021

## 2021-12-30 NOTE — TOC Transition Note (Signed)
Transition of Care [facility]) - CM/SW Discharge Note   Patient Details  Name: Laurie [name] MRN: [id number] Date of Birth: Nov 15, 1931  [facility]) CM/SW Contact:  Pollie Friar, RN Phone Number: 12/30/2021, 11:51 AM   Clinical Narrative:    PCP: [facility] [place] Patient is from home with her son. When her son is at work her daughter stays with her.  Pt with recommendations for 3 in 1. She is currently refusing the 3 in 1 and the daughter is in agreement.  Family provides needed transportation.  Son oversees her medications.  Pt has transport home today.   Final next level of care: Home w Home Health Services Barriers to Discharge: No Barriers Identified   Patient Goals and CMS Choice   CMS Medicare.gov Compare Post Acute Care list provided to:: Patient Represenative (must comment) Choice offered to / list presented to : Adult Children  Discharge Placement                       Discharge Plan and Services                          HH Arranged: PT, OT HH Agency: [place] Date [place]: 12/30/21   Representative spoke with at [place]: [place] Determinants of Health (SDOH) Interventions     Readmission Risk Interventions     View : No data to display.

## 2021-12-30 NOTE — Progress Notes (Addendum)
STROKE TEAM PROGRESS NOTE   INTERVAL HISTORY Patient is seen in her room with her daughter at the bedside.  Yesterday, she experienced acute onset slurred speech, but her speech has returned to baseline today.  MRI reveals small acute infarct in left corona radiata.  Echocardiogram shows ejection fraction of 60 to 65%.  Hemoglobin A1c is 5.5.  LDL cholesterol is 130 mg percent.  MR angiogram of the brain shows no significant large vessel stenosis or occlusion.  Vitals:   12/29/21 2355 12/30/21 0355 12/30/21 0829 12/30/21 1205  BP: (!) 179/77 (!) 165/89 (!) 154/79 (!) 141/66  Pulse: 65 71 80 78  Resp: '18 18 16 16  '$ Temp: 98.2 F (36.8 C) 98 F (36.7 C) 98.2 F (36.8 C) 97.8 F (36.6 C)  TempSrc: Oral Oral Oral Oral  SpO2: 98% 99% 99% 96%  Weight:      Height:       CBC:  Recent Labs  Lab 12/28/21 1429 12/29/21 1455  WBC 6.2 7.7  NEUTROABS 3.9  --   HGB 13.4 15.3*  HCT 41.1 45.2  MCV 97.2 96.0  PLT 215 628   Basic Metabolic Panel:  Recent Labs  Lab 12/28/21 1429 12/29/21 1455  NA 140 140  K 4.0 3.8  CL 106 109  CO2 25 22  GLUCOSE 84 146*  BUN 23 22  CREATININE 1.57* 1.55*  CALCIUM 9.3 9.3   Lipid Panel:  Recent Labs  Lab 12/29/21 1455  CHOL 225*  TRIG 114  HDL 72  CHOLHDL 3.1  VLDL 23  LDLCALC 130*   HgbA1c:  Recent Labs  Lab 12/29/21 1455  HGBA1C 5.5   Urine Drug Screen: No results for input(s): LABOPIA, COCAINSCRNUR, LABBENZ, AMPHETMU, THCU, LABBARB in the last 168 hours.  Alcohol Level No results for input(s): ETH in the last 168 hours.  IMAGING past 24 hours ECHOCARDIOGRAM COMPLETE  Result Date: 12/29/2021    ECHOCARDIOGRAM REPORT   Patient Name:   Laurie [name] Date of Exam: 12/29/2021 Medical Rec #:  [id number]          Height:       59.0 in Accession #:    [id number]         Weight:       129.0 lb Date of Birth:  Jun 12, 1932          BSA:          1.531 m Patient Age:    88 years           BP:           151/72 mmHg Patient Gender: F                   HR:           87 bpm. Exam Location:  Inpatient Procedure: 2D Echo, Cardiac Doppler and Color Doppler Indications:    TIA  History:        Patient has no prior history of Echocardiogram examinations.  Sonographer:    Jefferey Pica Referring Phys: [id number] RONDELL A SMITH IMPRESSIONS  1. Left ventricular ejection fraction, by estimation, is 60 to 65%. The left ventricle has normal function. The left ventricle has no regional wall motion abnormalities. There is mild concentric left ventricular hypertrophy. Left ventricular diastolic parameters were normal.  2. Right ventricular systolic function is normal. The right ventricular size is normal. Tricuspid regurgitation signal is inadequate for assessing PA pressure.  3. The mitral valve is normal in  structure. Mild mitral valve regurgitation. No evidence of mitral stenosis.  4. The aortic valve is tricuspid. Aortic valve regurgitation is not visualized. Aortic valve sclerosis is present, with no evidence of aortic valve stenosis.  5. The inferior vena cava is normal in size with greater than 50% respiratory variability, suggesting right atrial pressure of 3 mmHg. FINDINGS  Left Ventricle: Left ventricular ejection fraction, by estimation, is 60 to 65%. The left ventricle has normal function. The left ventricle has no regional wall motion abnormalities. The left ventricular internal cavity size was small. There is mild concentric left ventricular hypertrophy. Left ventricular diastolic parameters were normal. Right Ventricle: The right ventricular size is normal. No increase in right ventricular wall thickness. Right ventricular systolic function is normal. Tricuspid regurgitation signal is inadequate for assessing PA pressure. Left Atrium: Left atrial size was normal in size. Right Atrium: Right atrial size was normal in size. Pericardium: There is no evidence of pericardial effusion. Presence of epicardial fat layer. Mitral Valve: The mitral valve is normal in  structure. Mild mitral valve regurgitation. No evidence of mitral valve stenosis. Tricuspid Valve: The tricuspid valve is normal in structure. Tricuspid valve regurgitation is not demonstrated. No evidence of tricuspid stenosis. Aortic Valve: The aortic valve is tricuspid. Aortic valve regurgitation is not visualized. Aortic valve sclerosis is present, with no evidence of aortic valve stenosis. Aortic valve peak gradient measures 6.5 mmHg. Pulmonic Valve: The pulmonic valve was normal in structure. Pulmonic valve regurgitation is not visualized. No evidence of pulmonic stenosis. Aorta: The aortic root is normal in size and structure. Venous: The inferior vena cava is normal in size with greater than 50% respiratory variability, suggesting right atrial pressure of 3 mmHg. IAS/Shunts: No atrial level shunt detected by color flow Doppler.  LEFT VENTRICLE PLAX 2D LVIDd:         2.80 cm   Diastology LVIDs:         1.90 cm   LV e' lateral:   8.70 cm/s LV PW:         1.00 cm   LV E/e' lateral: 7.7 LV IVS:        1.40 cm LVOT diam:     2.00 cm LV SV:         62 LV SV Index:   40 LVOT Area:     3.14 cm  IVC IVC diam: 1.20 cm LEFT ATRIUM           Index LA diam:      2.30 cm 1.50 cm/m LA Vol (A2C): 18.2 ml 11.89 ml/m LA Vol (A4C): 34.7 ml 22.67 ml/m  AORTIC VALVE                 PULMONIC VALVE AV Area (Vmax): 2.43 cm     PV Vmax:       0.74 m/s AV Vmax:        127.50 cm/s  PV Peak grad:  2.2 mmHg AV Peak Grad:   6.5 mmHg LVOT Vmax:      98.70 cm/s LVOT Vmean:     59.500 cm/s LVOT VTI:       0.196 m  AORTA Ao Root diam: 3.00 cm Ao Asc diam:  2.90 cm MITRAL VALVE MV Area (PHT): 2.91 cm    SHUNTS MV Decel Time: 261 msec    Systemic VTI:  0.20 m MV E velocity: 67.40 cm/s  Systemic Diam: 2.00 cm MV A velocity: 97.90 cm/s MV E/A ratio:  0.69 Kardie Tobb DO  Electronically signed by Berniece Salines DO Signature Date/Time: 12/29/2021/4:59:03 PM    Final     PHYSICAL EXAM General:  Alert, well-nourished, well-developed elderly  Caucasian lady wearing neck brace Respiratory:  Regular, unlabored respirations on room air  NEURO:  Mental Status: AA&Ox3  Speech/Language: speech is without dysarthria or aphasia.  Repetition, fluency, and comprehension intact.  Cranial Nerves:  II: PERRL. Visual fields full.  III, IV, VI: EOMI. Eyelids elevate symmetrically.  V: Sensation is intact to light touch and symmetrical to face.  VII: Smile is symmetrical.   VIII: hearing intact to voice. IX, X:Phonation is normal.  XII: tongue is midline without fasciculations. Motor: 5/5 strength to all muscle groups tested.  Sensation- Intact to light touch bilaterally.  Coordination: FTN intact bilaterally. No drift.  Gait- deferred   ASSESSMENT/PLAN Laurie [name] is a 88 y.o. female with history of CKD, osteoporosis, HLD, squamous cell carcinoma, glaucoma, colon cancer and HTN presenting with acute onset slurred speech.  Her speech has returned to baseline today.  MRI reveals small acute infarct in left corona radiata.  Stroke:  left corona radiata infarct  likely secondary to small vessel disease CT head No acute abnormality.  MRI  small acute infarct in left corona radiata, chronic microvascular ischemic changes MRA head and neck no LVO, no carotid stenosis, no intracranial vessel stenosis 2D Echo EF 60-65%, no atrial level shunt LDL 130 HgbA1c 5.5 VTE prophylaxis - SQH    Diet   Diet regular Room service appropriate? Yes; Fluid consistency: Thin   No antithrombotic prior to admission, now on aspirin 81 mg daily and clopidogrel 75 mg daily for three weeks then aspirin indefinitely Therapy recommendations:  HH PT/OT Disposition:  home  Hypertension Home meds:  none Stable Permissive hypertension (OK if < 220/120) but gradually normalize in 5-7 days Long-term BP goal normotensive  Hyperlipidemia Home meds:  none LDL 130, goal < 70 Add atorvastatin 40 mg daily  Continue statin at discharge   Other Stroke  Risk Factors Advanced Age >/= 21  Former Cigarette smoker  Other Active Problems none  Hospital day # 0  Antony Contras , MSN, AGACNP-BC [facility] See Amion for schedule and pager information 12/30/2021 2:26 PM    STROKE MD NOTE :  I have personally obtained history,examined this patient, reviewed notes, independently viewed imaging studies, participated in medical decision making and plan of care.ROS completed by me personally and pertinent positives fully documented  I have made any additions or clarifications directly to the above note. Agree with note above.  Patient presented with speech difficulties secondary to left subcortical lacunar stroke from small vessel disease.  Recommend continue ongoing stroke work-up.  Aspirin and Plavix for 3 weeks followed by aspirin alone and aggressive risk factor modification.  Statin for elevated lipids.  Long discussion with patient and about her lacunar stroke and stroke prevention and treatment and answered questions.  Follow-up as an outpatient stroke clinic in 2 months.  Greater than 50% time during this 50-minute visit was spent in counseling and coordination of care about her lacunar stroke and discussion of stroke prevention and treatment with the patient and care team and answering questions.  Antony Contras, MD Medical Director [facility] Pager: [phone] 12/30/2021 2:27 PM   To contact Stroke Continuity provider, please refer to [email or web address] After hours, contact General Neurology

## 2021-12-30 NOTE — Hospital Course (Signed)
Laurie [name] is an 88 y.o. F with CKD IV, HTN, hx SBO s/p L hemicolectomy who prseneted with slurred speech and generalized weakness.   In the ER, BP >037 systolic, CTH normal.   6/5: Admitted, MRI brain showed small acute infarct L corona radiata

## 2021-12-30 NOTE — Evaluation (Signed)
Physical Therapy Evaluation Patient Details Name: Laurie [name] MRN: [id number] DOB: May 09, 1932 Today's Date: 12/30/2021  History of Present Illness  Pt is an 88 y/o F presenting to ED on 6/4 with lethargy, slurred speech, and RUE weakness. CT negative. MRI revealing small corona radiata infarct. PMH includes HTN, CKD III/IV, arthritis, SBO s/p L hemicolectomy.   Clinical Impression  Pt admitted with above. Per son pt was ambulating without AD PTA, stayed home alone without problems while son was at work, and was indep with ADLs and IADLs. Pt now unsteady requiring use of RW for safe ambulation and demonstrates impaired cognition. Pt with decreased safety awareness, R inattention, decreased insight to deficits, and poor memory/carry over of PT recommendations for safe transfers. Son reports his fiance will be staying with his mother all day. Pt to benefit from HHPT to progress towards indep function without AD as pt was PTA. Acute PT to cont to follow.       Recommendations for follow up therapy are one component of a multi-disciplinary discharge planning process, led by the attending physician.  Recommendations may be updated based on patient status, additional functional criteria and insurance authorization.  Follow Up Recommendations Home health PT    Assistance Recommended at Discharge Frequent or constant Supervision/Assistance  Patient can return home with the following  A little help with walking and/or transfers;A little help with bathing/dressing/bathroom;Assistance with cooking/housework;Direct supervision/assist for medications management;Direct supervision/assist for financial management;Assist for transportation;Help with stairs or ramp for entrance    Equipment Recommendations BSC/3in1  Recommendations for Other Services       Functional Status Assessment Patient has had a recent decline in their functional status and demonstrates the ability to make significant  improvements in function in a reasonable and predictable amount of time.     Precautions / Restrictions Precautions Precautions: Fall Restrictions Weight Bearing Restrictions: No      Mobility  Bed Mobility Overal bed mobility: Needs Assistance Bed Mobility: Supine to Sit     Supine to sit: Supervision     General bed mobility comments: used bed rail, HOB at 20 deg, increased time    Transfers Overall transfer level: Needs assistance Equipment used: Rolling walker (2 wheels) Transfers: Sit to/from Stand Sit to Stand: Min assist           General transfer comment: minA to power up, posterior lean and bracing self on bed with back of legs. verbal cues for safe hand placement and bend knees to feet were under knees to prevent posterior bias. pt with poor carry over of reaching back for chair/bed and pushing up from chair/bed instead of holding onto walker    Ambulation/Gait Ambulation/Gait assistance: Min assist Gait Distance (Feet): 70 Feet Assistive device: Rolling walker (2 wheels) Gait Pattern/deviations: Step-through pattern, Decreased stride length, Shuffle, Narrow base of support Gait velocity: slow Gait velocity interpretation: <1.31 ft/sec, indicative of household ambulator   General Gait Details: Pt unsteady with short step height and length, near shuffling, max verbal cues for safe walker managment, pt running into objects on the R  Stairs            Wheelchair Mobility    Modified Rankin (Stroke Patients Only) Modified Rankin (Stroke Patients Only) Pre-Morbid Rankin Score: Slight disability Modified Rankin: Moderately severe disability     Balance Overall balance assessment: Needs assistance Sitting-balance support: Feet supported, Bilateral upper extremity supported Sitting balance-Leahy Scale: Good Sitting balance - Comments: pt able to safely reach down and pull up  socks   Standing balance support: During functional activity, Reliant on  assistive device for balance Standing balance-Leahy Scale: Poor Standing balance comment: reliant on external support                             Pertinent Vitals/Pain Pain Assessment Pain Assessment: No/denies pain    Home Living Family/patient expects to be discharged to:: Private residence Living Arrangements: Children Available Help at Discharge: Family;Available 24 hours/day Type of Home: House Home Access: Stairs to enter   [employer] of Steps: 2   Home Layout: Two level;Able to live on main level with bedroom/bathroom (she stays in the master bedroom on the ground floor) Home Equipment: Cane - single point;Rollator (4 wheels) Additional Comments: family members alternating times taking care of pt    Prior Function Prior Level of Function : Independent/Modified Independent             Mobility Comments: has cane, but does not use per son ADLs Comments: does IADLs, does medication (eyes drops for glaucoma) and does finances     Hand Dominance   Dominant Hand: Right    Extremity/Trunk Assessment   Upper Extremity Assessment Upper Extremity Assessment: Generalized weakness    Lower Extremity Assessment Lower Extremity Assessment: Generalized weakness    Cervical / Trunk Assessment Cervical / Trunk Assessment: Normal  Communication   Communication: No difficulties  Cognition Arousal/Alertness: Awake/alert Behavior During Therapy: WFL for tasks assessed/performed Overall Cognitive Status: Impaired/Different from baseline Area of Impairment: Attention, Following commands, Safety/judgement, Memory, Awareness                   Current Attention Level: Focused Memory: Decreased short-term memory Following Commands: Follows one step commands with increased time Safety/Judgement: Decreased awareness of safety, Decreased awareness of deficits (R inattention) Awareness: Intellectual   General Comments: Pt running into objects more  so on the R, difficutly navigating room, also pt with glaucoma which could contribute to running into objects due to impaired vision        General Comments General comments (skin integrity, edema, etc.): VSS on RA    Exercises     Assessment/Plan    PT Assessment Patient needs continued PT services  PT Problem List Decreased strength;Decreased activity tolerance;Decreased balance;Decreased mobility;Decreased coordination;Decreased cognition;Decreased knowledge of use of DME;Decreased safety awareness       PT Treatment Interventions DME instruction;Gait training;Stair training;Functional mobility training;Therapeutic activities;Therapeutic exercise;Balance training;Neuromuscular re-education    PT Goals (Current goals can be found in the Care Plan section)  Acute Rehab PT Goals Patient Stated Goal: home today PT Goal Formulation: With patient/family Time For Goal Achievement: 01/13/22 Potential to Achieve Goals: Good    Frequency Min 4X/week     Co-evaluation               AM-PAC PT "6 Clicks" Mobility  Outcome Measure Help needed turning from your back to your side while in a flat bed without using bedrails?: None Help needed moving from lying on your back to sitting on the side of a flat bed without using bedrails?: None Help needed moving to and from a bed to a chair (including a wheelchair)?: A Little Help needed standing up from a chair using your arms (e.g., wheelchair or bedside chair)?: A Little Help needed to walk in hospital room?: A Lot Help needed climbing 3-5 steps with a railing? : A Lot 6 Click Score: 18    End  of Session Equipment Utilized During Treatment: Gait belt Activity Tolerance: Patient tolerated treatment well Patient left: in chair;with call bell/phone within reach;with family/visitor present Nurse Communication: Mobility status PT Visit Diagnosis: Unsteadiness on feet (R26.81);Muscle weakness (generalized) (M62.81);Difficulty in  walking, not elsewhere classified (R26.2)    Time: 1561-5379 PT Time Calculation (min) (ACUTE ONLY): 28 min   Charges:   PT Evaluation $PT Eval Moderate Complexity: 1 Mod PT Treatments $Gait Training: 8-22 mins        Kittie Plater, PT, DPT Acute Rehabilitation Services Secure chat preferred Office #: [phone]   Berline Lopes 12/30/2021, 10:02 AM

## 2022-01-02 DIAGNOSIS — M8580 Other specified disorders of bone density and structure, unspecified site: Secondary | ICD-10-CM | POA: Diagnosis not present

## 2022-01-02 DIAGNOSIS — I10 Essential (primary) hypertension: Secondary | ICD-10-CM | POA: Diagnosis not present

## 2022-01-02 DIAGNOSIS — M170 Bilateral primary osteoarthritis of knee: Secondary | ICD-10-CM | POA: Diagnosis not present

## 2022-01-02 DIAGNOSIS — G4730 Sleep apnea, unspecified: Secondary | ICD-10-CM | POA: Diagnosis not present

## 2022-01-02 DIAGNOSIS — R471 Dysarthria and anarthria: Secondary | ICD-10-CM | POA: Diagnosis not present

## 2022-01-02 DIAGNOSIS — N184 Chronic kidney disease, stage 4 (severe): Secondary | ICD-10-CM | POA: Diagnosis not present

## 2022-01-02 DIAGNOSIS — Z6824 Body mass index (BMI) 24.0-24.9, adult: Secondary | ICD-10-CM | POA: Diagnosis not present

## 2022-01-02 DIAGNOSIS — Z8673 Personal history of transient ischemic attack (TIA), and cerebral infarction without residual deficits: Secondary | ICD-10-CM | POA: Diagnosis not present

## 2022-01-02 DIAGNOSIS — D631 Anemia in chronic kidney disease: Secondary | ICD-10-CM | POA: Diagnosis not present

## 2022-01-02 DIAGNOSIS — I129 Hypertensive chronic kidney disease with stage 1 through stage 4 chronic kidney disease, or unspecified chronic kidney disease: Secondary | ICD-10-CM | POA: Diagnosis not present

## 2022-01-02 DIAGNOSIS — G8929 Other chronic pain: Secondary | ICD-10-CM | POA: Diagnosis not present

## 2022-01-02 DIAGNOSIS — F17210 Nicotine dependence, cigarettes, uncomplicated: Secondary | ICD-10-CM | POA: Diagnosis not present

## 2022-01-02 DIAGNOSIS — Z7982 Long term (current) use of aspirin: Secondary | ICD-10-CM | POA: Diagnosis not present

## 2022-01-02 DIAGNOSIS — H409 Unspecified glaucoma: Secondary | ICD-10-CM | POA: Diagnosis not present

## 2022-01-02 DIAGNOSIS — R634 Abnormal weight loss: Secondary | ICD-10-CM | POA: Diagnosis not present

## 2022-01-02 DIAGNOSIS — Z96641 Presence of right artificial hip joint: Secondary | ICD-10-CM | POA: Diagnosis not present

## 2022-01-02 DIAGNOSIS — Z7902 Long term (current) use of antithrombotics/antiplatelets: Secondary | ICD-10-CM | POA: Diagnosis not present

## 2022-01-07 DIAGNOSIS — Z09 Encounter for follow-up examination after completed treatment for conditions other than malignant neoplasm: Secondary | ICD-10-CM | POA: Diagnosis not present

## 2022-01-07 DIAGNOSIS — I639 Cerebral infarction, unspecified: Secondary | ICD-10-CM | POA: Diagnosis not present

## 2022-01-07 DIAGNOSIS — N184 Chronic kidney disease, stage 4 (severe): Secondary | ICD-10-CM | POA: Diagnosis not present

## 2022-01-13 DIAGNOSIS — H35373 Puckering of macula, bilateral: Secondary | ICD-10-CM | POA: Diagnosis not present

## 2022-01-13 DIAGNOSIS — H3561 Retinal hemorrhage, right eye: Secondary | ICD-10-CM | POA: Diagnosis not present

## 2022-01-13 DIAGNOSIS — H353132 Nonexudative age-related macular degeneration, bilateral, intermediate dry stage: Secondary | ICD-10-CM | POA: Diagnosis not present

## 2022-01-13 DIAGNOSIS — H43813 Vitreous degeneration, bilateral: Secondary | ICD-10-CM | POA: Diagnosis not present

## 2022-01-13 DIAGNOSIS — H348312 Tributary (branch) retinal vein occlusion, right eye, stable: Secondary | ICD-10-CM | POA: Diagnosis not present

## 2022-01-14 DIAGNOSIS — M8580 Other specified disorders of bone density and structure, unspecified site: Secondary | ICD-10-CM | POA: Diagnosis not present

## 2022-01-14 DIAGNOSIS — M170 Bilateral primary osteoarthritis of knee: Secondary | ICD-10-CM | POA: Diagnosis not present

## 2022-01-14 DIAGNOSIS — Z8673 Personal history of transient ischemic attack (TIA), and cerebral infarction without residual deficits: Secondary | ICD-10-CM | POA: Diagnosis not present

## 2022-01-14 DIAGNOSIS — H409 Unspecified glaucoma: Secondary | ICD-10-CM | POA: Diagnosis not present

## 2022-01-14 DIAGNOSIS — R471 Dysarthria and anarthria: Secondary | ICD-10-CM | POA: Diagnosis not present

## 2022-01-14 DIAGNOSIS — Z96641 Presence of right artificial hip joint: Secondary | ICD-10-CM | POA: Diagnosis not present

## 2022-01-14 DIAGNOSIS — R634 Abnormal weight loss: Secondary | ICD-10-CM | POA: Diagnosis not present

## 2022-01-14 DIAGNOSIS — D631 Anemia in chronic kidney disease: Secondary | ICD-10-CM | POA: Diagnosis not present

## 2022-01-14 DIAGNOSIS — G4730 Sleep apnea, unspecified: Secondary | ICD-10-CM | POA: Diagnosis not present

## 2022-01-14 DIAGNOSIS — G8929 Other chronic pain: Secondary | ICD-10-CM | POA: Diagnosis not present

## 2022-01-14 DIAGNOSIS — N184 Chronic kidney disease, stage 4 (severe): Secondary | ICD-10-CM | POA: Diagnosis not present

## 2022-01-14 DIAGNOSIS — Z7982 Long term (current) use of aspirin: Secondary | ICD-10-CM | POA: Diagnosis not present

## 2022-01-14 DIAGNOSIS — Z7902 Long term (current) use of antithrombotics/antiplatelets: Secondary | ICD-10-CM | POA: Diagnosis not present

## 2022-01-14 DIAGNOSIS — I10 Essential (primary) hypertension: Secondary | ICD-10-CM | POA: Diagnosis not present

## 2022-01-14 DIAGNOSIS — Z6824 Body mass index (BMI) 24.0-24.9, adult: Secondary | ICD-10-CM | POA: Diagnosis not present

## 2022-01-14 DIAGNOSIS — F17210 Nicotine dependence, cigarettes, uncomplicated: Secondary | ICD-10-CM | POA: Diagnosis not present

## 2022-01-14 DIAGNOSIS — I129 Hypertensive chronic kidney disease with stage 1 through stage 4 chronic kidney disease, or unspecified chronic kidney disease: Secondary | ICD-10-CM | POA: Diagnosis not present

## 2022-02-04 DIAGNOSIS — Z79899 Other long term (current) drug therapy: Secondary | ICD-10-CM | POA: Diagnosis not present

## 2022-02-04 DIAGNOSIS — E785 Hyperlipidemia, unspecified: Secondary | ICD-10-CM | POA: Diagnosis not present

## 2022-02-04 DIAGNOSIS — Z883 Allergy status to other anti-infective agents status: Secondary | ICD-10-CM | POA: Diagnosis not present

## 2022-02-04 DIAGNOSIS — Z885 Allergy status to narcotic agent status: Secondary | ICD-10-CM | POA: Diagnosis not present

## 2022-02-04 DIAGNOSIS — Z8673 Personal history of transient ischemic attack (TIA), and cerebral infarction without residual deficits: Secondary | ICD-10-CM | POA: Diagnosis not present

## 2022-02-04 DIAGNOSIS — Z91048 Other nonmedicinal substance allergy status: Secondary | ICD-10-CM | POA: Diagnosis not present

## 2022-02-04 DIAGNOSIS — H401123 Primary open-angle glaucoma, left eye, severe stage: Secondary | ICD-10-CM | POA: Diagnosis not present

## 2022-02-17 ENCOUNTER — Ambulatory Visit: Payer: Medicare Other | Admitting: Adult Health

## 2022-02-17 ENCOUNTER — Encounter: Payer: Self-pay | Admitting: Adult Health

## 2022-02-17 VITALS — BP 135/65 | HR 62 | Ht 59.0 in | Wt 132.0 lb

## 2022-02-17 DIAGNOSIS — Z09 Encounter for follow-up examination after completed treatment for conditions other than malignant neoplasm: Secondary | ICD-10-CM

## 2022-02-17 DIAGNOSIS — M79605 Pain in left leg: Secondary | ICD-10-CM

## 2022-02-17 DIAGNOSIS — I639 Cerebral infarction, unspecified: Secondary | ICD-10-CM

## 2022-02-17 HISTORY — PX: GLAUCOMA SURGERY: SHX656

## 2022-02-17 NOTE — Progress Notes (Signed)
[facility] [address] [place]. [place] [address] [phone]       HOSPITAL FOLLOW UP NOTE  [name] Date of Birth:  12/06/1931 Medical Record Number:  [id number]   Reason for Referral:  hospital stroke follow up    SUBJECTIVE:   CHIEF COMPLAINT:  Chief Complaint  Patient presents with   Follow-up    RM 2 with son Broadus John Pt is well, son states she just has some speech impairment but no other concerns     HPI:   [name] is a 88 y.o. female with history of CKD, osteoporosis, HLD, squamous cell carcinoma, glaucoma, colon cancer and HTN who presented on 12/28/2021 with acute onset slurred speech.  Personally reviewed hospitalization pertinent progress notes, lab work and imaging.  Evaluated by Dr. Leonie Man for small acute infarct in left corona radiata likely secondary to small vessel disease.  MRA head/neck negative LVO, no carotid stenosis or intracranial vessel stenosis.  EF 60 to 65%.  LDL 130.  A1c 5.5.  Recommended DAPT for 3 weeks and aspirin alone as well as initiate atorvastatin 40 mg daily.  No prior stroke history.  Therapy eval's recommended home health PT/OT    Today, 02/17/2022, patient is being seen for initial hospital follow-up accompanied by her son.  Has been doing well since discharge.  Reports residual occasional word finding difficulty. Denies any weakness. Will have some gait difficulty if sitting too long.  Complains of left leg pain which she reports has been present since her hospitalization. Also notes increased fatigue since stroke.  Use of cane with long distance or uneven ground, no recent falls. Was seen by [facility] PT/SLP and was released. Plans on getting set up with [facility] OT soon. Lives with her son and daughter lives near by. Able to maintain ADLs independently. Denies new stroke/TIA symptoms.   Is wearing a neck brace for a "pinched nerve" in her neck, reports previously seen by Ortho and was told due to arthritis  and "nothing could be done".  Neck brace helps with the pain as it helps to limit/protect cervical ROM.  This is a chronic issue and denies any worsening.  Completed 3 weeks DAPT, remains on aspirin alone as well as atorvastatin, denies side effects. Blood pressures today 135/65 - occasionally monitored at home which has been stable  Has since seen PCP since discharge, has f/u next month  No further concerns at this time     PERTINENT IMAGING  Per hospitalizations 02/17/2022 CT head No acute abnormality.  MRI  small acute infarct in left corona radiata, chronic microvascular ischemic changes MRA head and neck no LVO, no carotid stenosis, no intracranial vessel stenosis 2D Echo EF 60-65%, no atrial level shunt LDL 130 HgbA1c 5.5    ROS:   14 system review of systems performed and negative with exception of those listed in HPI  PMH:  Past Medical History:  Diagnosis Date   CKD (chronic kidney disease), stage III ([place])    Family history of breast cancer    mother   Family history of colon cancer    father   Glaucoma    Osteopenia    SBO (small bowel obstruction) (HCC)     PSH:  Past Surgical History:  Procedure Laterality Date   APPENDECTOMY     CESAREAN SECTION     x6   ESOPHAGOGASTRODUODENOSCOPY  07/07/2012   Procedure: ESOPHAGOGASTRODUODENOSCOPY (EGD);  Surgeon: Arta Silence, MD;  Location: [facility] ENDOSCOPY;  Service: Endoscopy;  Laterality: N/A;   GLAUCOMA SURGERY Bilateral 02/17/2022   LEFT COLECTOMY  04/30/2011   Sr Streck   tubal cyst  04/30/2011   L fallopian tube cyst incidentally found    Social History:  Social History   Socioeconomic History   Marital status: Widowed    Spouse name: Not on file   Number of children: Not on file   Years of education: Not on file   Highest education level: Not on file  Occupational History   Not on file  Tobacco Use   Smoking status: Former    Packs/day: 0.50    Types: Cigarettes   Smokeless tobacco: Not on  file  Vaping Use   Vaping Use: Not on file  Substance and Sexual Activity   Alcohol use: No    Comment: rare   Drug use: No   Sexual activity: Not Currently  Other Topics Concern   Not on file  Social History Narrative   Not on file   Social Determinants of Health   Financial Resource Strain: Not on file  Food Insecurity: Not on file  Transportation Needs: Not on file  Physical Activity: Not on file  Stress: Not on file  Social Connections: Not on file  Intimate Partner Violence: Not on file    Family History:  Family History  Problem Relation Age of Onset   Breast cancer Mother    Colon cancer Father    Diabetes Sister     Medications:   Current Outpatient Medications on File Prior to Visit  Medication Sig Dispense Refill   acetaminophen (TYLENOL) 500 MG tablet Take 1,000 mg by mouth every 6 (six) hours as needed.     aspirin EC 81 MG tablet Take 1 tablet (81 mg total) by mouth daily. Swallow whole. 30 tablet 12   atorvastatin (LIPITOR) 40 MG tablet Take 1 tablet (40 mg total) by mouth daily. 30 tablet 11   Atropine Sulfate 0.05 % SOLN Apply to eye.     brimonidine (ALPHAGAN) 0.2 % ophthalmic solution Place 1 drop into both eyes 3 (three) times daily.     dorzolamide-timolol (COSOPT) 22.3-6.8 MG/ML ophthalmic solution 1 drop 2 (two) times daily.       fluorouracil (EFUDEX) 5 % cream Apply 1 application. topically daily.     latanoprost (XALATAN) 0.005 % ophthalmic solution Place 1 drop into both eyes every evening.     Multiple Vitamins-Minerals (PRESERVISION AREDS 2) CAPS Take 1 tablet by mouth 2 (two) times daily.     OPTIVE 0.5-0.9 % ophthalmic solution Place 1 drop into both eyes 4 (four) times daily as needed for dry eyes.     prednisoLONE acetate (PRED MILD) 0.12 % ophthalmic suspension 1 drop 3 (three) times daily.     RHOPRESSA 0.02 % SOLN Place 1 drop into both eyes every evening.     Travoprost, BAK Free, (TRAVATAMN) 0.004 % SOLN ophthalmic solution Place 1  drop into both eyes at bedtime.      No current facility-administered medications on file prior to visit.    Allergies:   Allergies  Allergen Reactions   Actonel [Risedronate] Nausea And Vomiting   Fosamax [Alendronate] Nausea And Vomiting   Miacalcin Other (See Comments)    Unknown reaction   Neomycin Other (See Comments)    Unknown reaction   Neosporin [Neomycin-Polymyxin-Gramicidin] Swelling   Neurontin [Gabapentin] Other (See Comments)    Confusion   Codeine Palpitations      OBJECTIVE:  Physical Exam  Vitals:   02/17/22 1010  BP: 135/65  Pulse: 62  Weight: 132 lb (59.9 kg)  Height: '4\' 11"'$  (1.499 m)   Body mass index is 26.66 kg/m. No results found.  Poststroke PHQ 2/9    02/17/2022   11:16 AM  Depression screen PHQ 2/9  Decreased Interest 0  Down, Depressed, Hopeless 0  PHQ - 2 Score 0     General: well developed, well nourished, very pleasant elderly Caucasian female, seated, in no evident distress with soft neck brace intact Head: head normocephalic and atraumatic.   Neck: supple with no carotid or supraclavicular bruits Cardiovascular: regular rate and rhythm, no murmurs Musculoskeletal: no deformity; decreased LLE ROM 2/2 pain Skin:  no rash/petichiae Vascular:  Normal pulses all extremities   Neurologic Exam Mental Status: Awake and fully alert.  Mild dysarthria with occasional speech hesitancy.  Able to follow commands without difficulty.  Oriented to place and time. Recent memory mildly impaired and remote memory intact. Attention span, concentration and fund of knowledge appropriate during visit. Mood and affect appropriate.  Cranial Nerves: Fundoscopic exam reveals sharp disc margins. Pupils equal, briskly reactive to light. Extraocular movements full without nystagmus. Visual fields full to confrontation. Hearing intact. Facial sensation intact. Face, tongue, palate moves normally and symmetrically.  Motor: Normal bulk and tone. Normal strength  in all tested extremity muscles Sensory.: intact to touch , pinprick , position and vibratory sensation.  Coordination: Rapid alternating movements normal in all extremities. Finger-to-nose and heel-to-shin performed accurately bilaterally. Gait and Station: Arises from chair with with mild difficulty. Stance is slightly hunched. Gait demonstrates decreased stride length and step height bilaterally with mild imbalance and assisted by son (did not bring cane into office).  Tandem walk and heel toe not attempted.  Reflexes: 1+ and symmetric. Toes downgoing.     NIHSS  1 Modified Rankin  2-3      ASSESSMENT: Laurie [name] is a 88 y.o. year old female with left corona radiata infarct on 12/28/2021 likely secondary to small vessel disease. Vascular risk factors include HTN, HLD, advanced age and former tobacco use.      PLAN:  Left CR stroke:  Residual deficit: Mild dysarthria and occasional aphasia and gait impairment. Cleared by [facility] PT/SLP. Plans on initial evaluation with OT soon.  Discussed changes sethi to outpatient therapies once completed if indicated.  Discussed use of cane at all times unless otherwise instructed.  Discussed typical recovery time. Continue aspirin 81 mg daily  and atorvastatin 40 mg daily for secondary stroke prevention.   Discussed secondary stroke prevention measures and importance of close PCP follow up for aggressive stroke risk factor management including BP goal<130/90, HLD with LDL goal<70 and DM with A1c.<7 .  Stroke labs 12/2021: LDL 130, A1c 5.5 I have gone over the pathophysiology of stroke, warning signs and symptoms, risk factors and their management in some detail with instructions to go to the closest emergency room for symptoms of concern. Left leg pain: Suspect more musculoskeletal in etiology. Good strength but was guarded and decreased ROM 2/2 pain.  LE ultrasound negative for DVT.  Advised to f/u with PCP if symptoms persist or interfere with  functioning    Follow up in 6 months or call earlier if needed   CC:  [place] provider: Dr. Leonie Man PCP: Lawerance Cruel, MD    I spent 56 minutes of face-to-face and non-face-to-face time with patient and son.  This included previsit chart review including review of recent hospitalization,  lab review, study review, electronic health record documentation, patient and son education regarding recent stroke including etiology, secondary stroke prevention measures and importance of managing stroke risk factors, residual deficits and typical recovery time and answered all other questions to patient and sons satisfaction   Frann Rider, AGNP-BC  [facility] [address] [place], [place] [address]  Phone [phone] Fax [phone] Note: This document was prepared with digital dictation and possible smart phrase technology. Any transcriptional errors that result from this process are unintentional.

## 2022-02-17 NOTE — Patient Instructions (Addendum)
Start occupational therapy at home - If you would like to do any additional therapies outpatient, please let me know  Continue aspirin 81 mg daily  and atorvastatin 40 mg daily for secondary stroke prevention  Continue to follow up with PCP regarding blood pressure and cholesterol management  Maintain strict control of hypertension with blood pressure goal below 130/90 and cholesterol with LDL cholesterol (bad cholesterol) goal below 70 mg/dL.   Signs of a Stroke? Follow the BEFAST method:  Balance Watch for a sudden loss of balance, trouble with coordination or vertigo Eyes Is there a sudden loss of vision in one or both eyes? Or double vision?  Face: Ask the person to smile. Does one side of the face droop or is it numb?  Arms: Ask the person to raise both arms. Does one arm drift downward? Is there weakness or numbness of a leg? Speech: Ask the person to repeat a simple phrase. Does the speech sound slurred/strange? Is the person confused ? Time: If you observe any of these signs, call 911.     Followup in the future with me in 6 months or call earlier if needed      Thank you for coming to see [place] at [facility]. I hope we have been able to provide you high quality care today.  You may receive a patient satisfaction survey over the next few weeks. We would appreciate your feedback and comments so that we may continue to improve ourselves and the health of our patients.    Stroke Prevention Some medical conditions and lifestyle choices can lead to a higher risk for a stroke. You can help to prevent a stroke by eating healthy foods and exercising. It also helps to not smoke and to manage any health problems you may have. How can this condition affect me? A stroke is an emergency. It should be treated right away. A stroke can lead to brain damage or threaten your life. There is a better chance of surviving and getting better after a stroke if you get medical help  right away. What can increase my risk? The following medical conditions may increase your risk of a stroke: Diseases of the heart and blood vessels (cardiovascular disease). High blood pressure (hypertension). Diabetes. High cholesterol. Sickle cell disease. Problems with blood clotting. Being very overweight. Sleeping problems (obstructivesleep apnea). Other risk factors include: Being older than age 53. A history of blood clots, stroke, or mini-stroke (TIA). Race, ethnic background, or a family history of stroke. Smoking or using tobacco products. Taking birth control pills, especially if you smoke. Heavy alcohol and drug use. Not being active. What actions can I take to prevent this? Manage your health conditions High cholesterol. Eat a healthy diet. If this is not enough to manage your cholesterol, you may need to take medicines. Take medicines as told by your doctor. High blood pressure. Try to keep your blood pressure below 130/80. If your blood pressure cannot be managed through a healthy diet and regular exercise, you may need to take medicines. Take medicines as told by your doctor. Ask your doctor if you should check your blood pressure at home. Have your blood pressure checked every year. Diabetes. Eat a healthy diet and get regular exercise. If your blood sugar (glucose) cannot be managed through diet and exercise, you may need to take medicines. Take medicines as told by your doctor. Talk to your doctor about getting checked for sleeping problems. Signs of a problem can include:  Snoring a lot. Feeling very tired. Make sure that you manage any other conditions you have. Nutrition  Follow instructions from your doctor about what to eat or drink. You may be told to: Eat and drink fewer calories each day. Limit how much salt (sodium) you use to 1,500 milligrams (mg) each day. Use only healthy fats for cooking, such as olive oil, canola oil, and sunflower oil. Eat  healthy foods. To do this: Choose foods that are high in fiber. These include whole grains, and fresh fruits and vegetables. Eat at least 5 servings of fruits and vegetables a day. Try to fill one-half of your plate with fruits and vegetables at each meal. Choose low-fat (lean) proteins. These include low-fat cuts of meat, chicken without skin, fish, tofu, beans, and nuts. Eat low-fat dairy products. Avoid foods that: Are high in salt. Have saturated fat. Have trans fat. Have cholesterol. Are processed or pre-made. Count how many carbohydrates you eat and drink each day. Lifestyle If you drink alcohol: Limit how much you have to: 0-1 drink a day for women who are not pregnant. 0-2 drinks a day for men. Know how much alcohol is in your drink. In the [place] one drink equals one 12 oz bottle of beer (322m), one 5 oz glass of wine (1438m, or one 1 oz glass of hard liquor (4455m Do not smoke or use any products that have nicotine or tobacco. If you need help quitting, ask your doctor. Avoid secondhand smoke. Do not use drugs. Activity  Try to stay at a healthy weight. Get at least 30 minutes of exercise on most days, such as: Fast walking. Biking. Swimming. Medicines Take over-the-counter and prescription medicines only as told by your doctor. Avoid taking birth control pills. Talk to your doctor about the risks of taking birth control pills if: You are over 35 35ars old. You smoke. You get very bad headaches. You have had a blood clot. Where to find more information American Stroke Association: www.strokeassociation.org Get help right away if: You or a loved one has any signs of a stroke. "BE FAST" is an easy way to remember the warning signs: B - Balance. Dizziness, sudden trouble walking, or loss of balance. E - Eyes. Trouble seeing or a change in how you see. F - Face. Sudden weakness or loss of feeling of the face. The face or eyelid may droop on one side. A - Arms.  Weakness or loss of feeling in an arm. This happens all of a sudden and most often on one side of the body. S - Speech. Sudden trouble speaking, slurred speech, or trouble understanding what people say. T - Time. Time to call emergency services. Write down what time symptoms started. You or a loved one has other signs of a stroke, such as: A sudden, very bad headache with no known cause. Feeling like you may vomit (nausea). Vomiting. A seizure. These symptoms may be an emergency. Get help right away. Call your local emergency services (911 in the [place] Do not wait to see if the symptoms will go away. Do not drive yourself to the hospital. Summary You can help to prevent a stroke by eating healthy, exercising, and not smoking. It also helps to manage any health problems you have. Do not smoke or use any products that contain nicotine or tobacco. Get help right away if you or a loved one has any signs of a stroke. This information is not intended to replace advice given to you  by your health care provider. Make sure you discuss any questions you have with your health care provider. Document Revised: 02/12/2020 Document Reviewed: 02/12/2020 Elsevier Patient Education  [place].

## 2022-02-17 NOTE — Progress Notes (Signed)
I agree with the above plan 

## 2022-03-01 DIAGNOSIS — S2020XA Contusion of thorax, unspecified, initial encounter: Secondary | ICD-10-CM | POA: Diagnosis not present

## 2022-03-01 DIAGNOSIS — S4991XA Unspecified injury of right shoulder and upper arm, initial encounter: Secondary | ICD-10-CM | POA: Diagnosis not present

## 2022-03-01 DIAGNOSIS — W1839XA Other fall on same level, initial encounter: Secondary | ICD-10-CM | POA: Diagnosis not present

## 2022-03-03 DIAGNOSIS — I10 Essential (primary) hypertension: Secondary | ICD-10-CM | POA: Diagnosis not present

## 2022-03-03 DIAGNOSIS — M810 Age-related osteoporosis without current pathological fracture: Secondary | ICD-10-CM | POA: Diagnosis not present

## 2022-03-03 DIAGNOSIS — I69359 Hemiplegia and hemiparesis following cerebral infarction affecting unspecified side: Secondary | ICD-10-CM | POA: Diagnosis not present

## 2022-03-03 DIAGNOSIS — S2241XD Multiple fractures of ribs, right side, subsequent encounter for fracture with routine healing: Secondary | ICD-10-CM | POA: Diagnosis not present

## 2022-03-04 ENCOUNTER — Other Ambulatory Visit: Payer: Self-pay | Admitting: Family Medicine

## 2022-03-04 DIAGNOSIS — R5381 Other malaise: Secondary | ICD-10-CM

## 2022-03-05 ENCOUNTER — Other Ambulatory Visit: Payer: Self-pay | Admitting: Family Medicine

## 2022-03-05 DIAGNOSIS — M8580 Other specified disorders of bone density and structure, unspecified site: Secondary | ICD-10-CM

## 2022-03-09 ENCOUNTER — Ambulatory Visit (INDEPENDENT_AMBULATORY_CARE_PROVIDER_SITE_OTHER): Payer: Medicare Other

## 2022-03-09 ENCOUNTER — Ambulatory Visit (INDEPENDENT_AMBULATORY_CARE_PROVIDER_SITE_OTHER): Payer: Medicare Other | Admitting: Orthopaedic Surgery

## 2022-03-09 DIAGNOSIS — W19XXXD Unspecified fall, subsequent encounter: Secondary | ICD-10-CM

## 2022-03-09 DIAGNOSIS — S2241XA Multiple fractures of ribs, right side, initial encounter for closed fracture: Secondary | ICD-10-CM | POA: Diagnosis not present

## 2022-03-09 DIAGNOSIS — H401133 Primary open-angle glaucoma, bilateral, severe stage: Secondary | ICD-10-CM | POA: Diagnosis not present

## 2022-03-09 DIAGNOSIS — M25511 Pain in right shoulder: Secondary | ICD-10-CM

## 2022-03-09 NOTE — Progress Notes (Signed)
Chief Complaint: Right rib pain shoulder pain     History of Present Illness:    Laurie [name] is a 88 y.o. female presents with right rib and shoulder pain after a fall directly on the side.  Of note she does have a history of stroke she states that she fell backwards and onto the side when a door And hit her.  She does have baseline right-sided weakness.  She is here today in a wheelchair with her daughter.    Surgical History:   None  PMH/PSH/Family History/Social History/Meds/Allergies:    Past Medical History:  Diagnosis Date   CKD (chronic kidney disease), stage III (HCC)    Family history of breast cancer    mother   Family history of colon cancer    father   Glaucoma    Osteopenia    SBO (small bowel obstruction) ([place])    Past Surgical History:  Procedure Laterality Date   APPENDECTOMY     CESAREAN SECTION     x6   ESOPHAGOGASTRODUODENOSCOPY  07/07/2012   Procedure: ESOPHAGOGASTRODUODENOSCOPY (EGD);  Surgeon: Arta Silence, MD;  Location: [facility] ENDOSCOPY;  Service: Endoscopy;  Laterality: N/A;   GLAUCOMA SURGERY Bilateral 02/17/2022   LEFT COLECTOMY  04/30/2011   Sr Streck   tubal cyst  04/30/2011   L fallopian tube cyst incidentally found   Social History   Socioeconomic History   Marital status: Widowed    Spouse name: Not on file   Number of children: Not on file   Years of education: Not on file   Highest education level: Not on file  Occupational History   Not on file  Tobacco Use   Smoking status: Former    Packs/day: 0.50    Types: Cigarettes   Smokeless tobacco: Not on file  Vaping Use   Vaping Use: Not on file  Substance and Sexual Activity   Alcohol use: No    Comment: rare   Drug use: No   Sexual activity: Not Currently  Other Topics Concern   Not on file  Social History Narrative   Not on file   Social Determinants of Health   Financial Resource Strain: Not on file  Food Insecurity: Not  on file  Transportation Needs: Not on file  Physical Activity: Not on file  Stress: Not on file  Social Connections: Not on file   Family History  Problem Relation Age of Onset   Breast cancer Mother    Colon cancer Father    Diabetes Sister    Allergies  Allergen Reactions   Actonel [Risedronate] Nausea And Vomiting   Fosamax [Alendronate] Nausea And Vomiting   Miacalcin Other (See Comments)    Unknown reaction   Neomycin Other (See Comments)    Unknown reaction   Neosporin [Neomycin-Polymyxin-Gramicidin] Swelling   Neurontin [Gabapentin] Other (See Comments)    Confusion   Codeine Palpitations   Current Outpatient Medications  Medication Sig Dispense Refill   acetaminophen (TYLENOL) 500 MG tablet Take 1,000 mg by mouth every 6 (six) hours as needed.     aspirin EC 81 MG tablet Take 1 tablet (81 mg total) by mouth daily. Swallow whole. 30 tablet 12   atorvastatin (LIPITOR) 40 MG tablet Take 1 tablet (40 mg total) by mouth daily. 30 tablet 11   Atropine  Sulfate 0.05 % SOLN Apply to eye.     brimonidine (ALPHAGAN) 0.2 % ophthalmic solution Place 1 drop into both eyes 3 (three) times daily.     dorzolamide-timolol (COSOPT) 22.3-6.8 MG/ML ophthalmic solution 1 drop 2 (two) times daily.       fluorouracil (EFUDEX) 5 % cream Apply 1 application. topically daily.     latanoprost (XALATAN) 0.005 % ophthalmic solution Place 1 drop into both eyes every evening.     Multiple Vitamins-Minerals (PRESERVISION AREDS 2) CAPS Take 1 tablet by mouth 2 (two) times daily.     OPTIVE 0.5-0.9 % ophthalmic solution Place 1 drop into both eyes 4 (four) times daily as needed for dry eyes.     prednisoLONE acetate (PRED MILD) 0.12 % ophthalmic suspension 1 drop 3 (three) times daily.     RHOPRESSA 0.02 % SOLN Place 1 drop into both eyes every evening.     Travoprost, BAK Free, (TRAVATAMN) 0.004 % SOLN ophthalmic solution Place 1 drop into both eyes at bedtime.      No current facility-administered  medications for this visit.   No results found.  Review of Systems:   A ROS was performed including pertinent positives and negatives as documented in the HPI.  Physical Exam :   Constitutional: NAD and appears stated age Neurological: Alert and oriented Psych: Appropriate affect and cooperative There were no vitals taken for this visit.   Comprehensive Musculoskeletal Exam:    Tenderness palpation about the right lower ribs anteriorly.  Tenderness with direct compression.  No tenderness with palpation of the right shoulder.  Full active elevation of the right shoulder is to 140 degrees on both sides.  External rotation is to 45 degrees on both sides  Imaging:   Xray (3 views right shoulder, 2 views right ribs): Normal   I personally reviewed and interpreted the radiographs.   Assessment:   88 y.o. female with a right rib contusion after fall directly on the side.  At this time she does have physical therapy ordered to her house.  I would like her to complete this.  I will plan to see her back in 1 month for reassessment as needed  Plan :    -Return to clinic in 1 month for reassessment as needed     I personally saw and evaluated the patient, and participated in the management and treatment plan.  Vanetta Mulders, MD Attending Physician, Orthopedic Surgery  This document was dictated using Dragon voice recognition software. A reasonable attempt at proof reading has been made to minimize errors.

## 2022-03-10 DIAGNOSIS — Z85828 Personal history of other malignant neoplasm of skin: Secondary | ICD-10-CM | POA: Diagnosis not present

## 2022-03-10 DIAGNOSIS — Z87891 Personal history of nicotine dependence: Secondary | ICD-10-CM | POA: Diagnosis not present

## 2022-03-10 DIAGNOSIS — I4891 Unspecified atrial fibrillation: Secondary | ICD-10-CM | POA: Diagnosis not present

## 2022-03-10 DIAGNOSIS — H409 Unspecified glaucoma: Secondary | ICD-10-CM | POA: Diagnosis not present

## 2022-03-10 DIAGNOSIS — Z9181 History of falling: Secondary | ICD-10-CM | POA: Diagnosis not present

## 2022-03-10 DIAGNOSIS — M1990 Unspecified osteoarthritis, unspecified site: Secondary | ICD-10-CM | POA: Diagnosis not present

## 2022-03-10 DIAGNOSIS — I69351 Hemiplegia and hemiparesis following cerebral infarction affecting right dominant side: Secondary | ICD-10-CM | POA: Diagnosis not present

## 2022-03-10 DIAGNOSIS — N184 Chronic kidney disease, stage 4 (severe): Secondary | ICD-10-CM | POA: Diagnosis not present

## 2022-03-10 DIAGNOSIS — M800AXD Age-related osteoporosis with current pathological fracture, other site, subsequent encounter for fracture with routine healing: Secondary | ICD-10-CM | POA: Diagnosis not present

## 2022-03-10 DIAGNOSIS — Z9049 Acquired absence of other specified parts of digestive tract: Secondary | ICD-10-CM | POA: Diagnosis not present

## 2022-03-10 DIAGNOSIS — Z7982 Long term (current) use of aspirin: Secondary | ICD-10-CM | POA: Diagnosis not present

## 2022-03-10 DIAGNOSIS — I129 Hypertensive chronic kidney disease with stage 1 through stage 4 chronic kidney disease, or unspecified chronic kidney disease: Secondary | ICD-10-CM | POA: Diagnosis not present

## 2022-03-11 ENCOUNTER — Ambulatory Visit (HOSPITAL_BASED_OUTPATIENT_CLINIC_OR_DEPARTMENT_OTHER): Payer: Medicare Other | Admitting: Orthopaedic Surgery

## 2022-03-26 DIAGNOSIS — Z7982 Long term (current) use of aspirin: Secondary | ICD-10-CM | POA: Diagnosis not present

## 2022-03-26 DIAGNOSIS — Z87891 Personal history of nicotine dependence: Secondary | ICD-10-CM | POA: Diagnosis not present

## 2022-03-26 DIAGNOSIS — N184 Chronic kidney disease, stage 4 (severe): Secondary | ICD-10-CM | POA: Diagnosis not present

## 2022-03-26 DIAGNOSIS — M1990 Unspecified osteoarthritis, unspecified site: Secondary | ICD-10-CM | POA: Diagnosis not present

## 2022-03-26 DIAGNOSIS — I4891 Unspecified atrial fibrillation: Secondary | ICD-10-CM | POA: Diagnosis not present

## 2022-03-26 DIAGNOSIS — I69351 Hemiplegia and hemiparesis following cerebral infarction affecting right dominant side: Secondary | ICD-10-CM | POA: Diagnosis not present

## 2022-03-26 DIAGNOSIS — Z9049 Acquired absence of other specified parts of digestive tract: Secondary | ICD-10-CM | POA: Diagnosis not present

## 2022-03-26 DIAGNOSIS — M800AXD Age-related osteoporosis with current pathological fracture, other site, subsequent encounter for fracture with routine healing: Secondary | ICD-10-CM | POA: Diagnosis not present

## 2022-03-26 DIAGNOSIS — I129 Hypertensive chronic kidney disease with stage 1 through stage 4 chronic kidney disease, or unspecified chronic kidney disease: Secondary | ICD-10-CM | POA: Diagnosis not present

## 2022-03-26 DIAGNOSIS — H409 Unspecified glaucoma: Secondary | ICD-10-CM | POA: Diagnosis not present

## 2022-03-26 DIAGNOSIS — Z85828 Personal history of other malignant neoplasm of skin: Secondary | ICD-10-CM | POA: Diagnosis not present

## 2022-03-26 DIAGNOSIS — Z9181 History of falling: Secondary | ICD-10-CM | POA: Diagnosis not present

## 2022-03-27 DIAGNOSIS — Z9049 Acquired absence of other specified parts of digestive tract: Secondary | ICD-10-CM | POA: Diagnosis not present

## 2022-03-27 DIAGNOSIS — Z9181 History of falling: Secondary | ICD-10-CM | POA: Diagnosis not present

## 2022-03-27 DIAGNOSIS — Z7982 Long term (current) use of aspirin: Secondary | ICD-10-CM | POA: Diagnosis not present

## 2022-03-27 DIAGNOSIS — H409 Unspecified glaucoma: Secondary | ICD-10-CM | POA: Diagnosis not present

## 2022-03-27 DIAGNOSIS — Z85828 Personal history of other malignant neoplasm of skin: Secondary | ICD-10-CM | POA: Diagnosis not present

## 2022-03-27 DIAGNOSIS — M1990 Unspecified osteoarthritis, unspecified site: Secondary | ICD-10-CM | POA: Diagnosis not present

## 2022-03-27 DIAGNOSIS — N184 Chronic kidney disease, stage 4 (severe): Secondary | ICD-10-CM | POA: Diagnosis not present

## 2022-03-27 DIAGNOSIS — M800AXD Age-related osteoporosis with current pathological fracture, other site, subsequent encounter for fracture with routine healing: Secondary | ICD-10-CM | POA: Diagnosis not present

## 2022-03-27 DIAGNOSIS — Z87891 Personal history of nicotine dependence: Secondary | ICD-10-CM | POA: Diagnosis not present

## 2022-03-27 DIAGNOSIS — I69351 Hemiplegia and hemiparesis following cerebral infarction affecting right dominant side: Secondary | ICD-10-CM | POA: Diagnosis not present

## 2022-03-27 DIAGNOSIS — I129 Hypertensive chronic kidney disease with stage 1 through stage 4 chronic kidney disease, or unspecified chronic kidney disease: Secondary | ICD-10-CM | POA: Diagnosis not present

## 2022-03-27 DIAGNOSIS — I4891 Unspecified atrial fibrillation: Secondary | ICD-10-CM | POA: Diagnosis not present

## 2022-03-30 DIAGNOSIS — I129 Hypertensive chronic kidney disease with stage 1 through stage 4 chronic kidney disease, or unspecified chronic kidney disease: Secondary | ICD-10-CM | POA: Diagnosis not present

## 2022-03-30 DIAGNOSIS — Z7982 Long term (current) use of aspirin: Secondary | ICD-10-CM | POA: Diagnosis not present

## 2022-03-30 DIAGNOSIS — Z87891 Personal history of nicotine dependence: Secondary | ICD-10-CM | POA: Diagnosis not present

## 2022-03-30 DIAGNOSIS — I4891 Unspecified atrial fibrillation: Secondary | ICD-10-CM | POA: Diagnosis not present

## 2022-03-30 DIAGNOSIS — H409 Unspecified glaucoma: Secondary | ICD-10-CM | POA: Diagnosis not present

## 2022-03-30 DIAGNOSIS — M1990 Unspecified osteoarthritis, unspecified site: Secondary | ICD-10-CM | POA: Diagnosis not present

## 2022-03-30 DIAGNOSIS — Z85828 Personal history of other malignant neoplasm of skin: Secondary | ICD-10-CM | POA: Diagnosis not present

## 2022-03-30 DIAGNOSIS — Z9181 History of falling: Secondary | ICD-10-CM | POA: Diagnosis not present

## 2022-03-30 DIAGNOSIS — M800AXD Age-related osteoporosis with current pathological fracture, other site, subsequent encounter for fracture with routine healing: Secondary | ICD-10-CM | POA: Diagnosis not present

## 2022-03-30 DIAGNOSIS — Z9049 Acquired absence of other specified parts of digestive tract: Secondary | ICD-10-CM | POA: Diagnosis not present

## 2022-03-30 DIAGNOSIS — I69351 Hemiplegia and hemiparesis following cerebral infarction affecting right dominant side: Secondary | ICD-10-CM | POA: Diagnosis not present

## 2022-03-30 DIAGNOSIS — N184 Chronic kidney disease, stage 4 (severe): Secondary | ICD-10-CM | POA: Diagnosis not present

## 2022-04-01 DIAGNOSIS — H409 Unspecified glaucoma: Secondary | ICD-10-CM | POA: Diagnosis not present

## 2022-04-01 DIAGNOSIS — N184 Chronic kidney disease, stage 4 (severe): Secondary | ICD-10-CM | POA: Diagnosis not present

## 2022-04-01 DIAGNOSIS — Z85828 Personal history of other malignant neoplasm of skin: Secondary | ICD-10-CM | POA: Diagnosis not present

## 2022-04-01 DIAGNOSIS — M800AXD Age-related osteoporosis with current pathological fracture, other site, subsequent encounter for fracture with routine healing: Secondary | ICD-10-CM | POA: Diagnosis not present

## 2022-04-01 DIAGNOSIS — Z9049 Acquired absence of other specified parts of digestive tract: Secondary | ICD-10-CM | POA: Diagnosis not present

## 2022-04-01 DIAGNOSIS — I129 Hypertensive chronic kidney disease with stage 1 through stage 4 chronic kidney disease, or unspecified chronic kidney disease: Secondary | ICD-10-CM | POA: Diagnosis not present

## 2022-04-01 DIAGNOSIS — Z9181 History of falling: Secondary | ICD-10-CM | POA: Diagnosis not present

## 2022-04-01 DIAGNOSIS — M1990 Unspecified osteoarthritis, unspecified site: Secondary | ICD-10-CM | POA: Diagnosis not present

## 2022-04-01 DIAGNOSIS — Z7982 Long term (current) use of aspirin: Secondary | ICD-10-CM | POA: Diagnosis not present

## 2022-04-01 DIAGNOSIS — I69351 Hemiplegia and hemiparesis following cerebral infarction affecting right dominant side: Secondary | ICD-10-CM | POA: Diagnosis not present

## 2022-04-01 DIAGNOSIS — I4891 Unspecified atrial fibrillation: Secondary | ICD-10-CM | POA: Diagnosis not present

## 2022-04-01 DIAGNOSIS — Z87891 Personal history of nicotine dependence: Secondary | ICD-10-CM | POA: Diagnosis not present

## 2022-04-08 DIAGNOSIS — Z9181 History of falling: Secondary | ICD-10-CM | POA: Diagnosis not present

## 2022-04-08 DIAGNOSIS — H409 Unspecified glaucoma: Secondary | ICD-10-CM | POA: Diagnosis not present

## 2022-04-08 DIAGNOSIS — M800AXD Age-related osteoporosis with current pathological fracture, other site, subsequent encounter for fracture with routine healing: Secondary | ICD-10-CM | POA: Diagnosis not present

## 2022-04-08 DIAGNOSIS — I69351 Hemiplegia and hemiparesis following cerebral infarction affecting right dominant side: Secondary | ICD-10-CM | POA: Diagnosis not present

## 2022-04-08 DIAGNOSIS — Z87891 Personal history of nicotine dependence: Secondary | ICD-10-CM | POA: Diagnosis not present

## 2022-04-08 DIAGNOSIS — I4891 Unspecified atrial fibrillation: Secondary | ICD-10-CM | POA: Diagnosis not present

## 2022-04-08 DIAGNOSIS — I129 Hypertensive chronic kidney disease with stage 1 through stage 4 chronic kidney disease, or unspecified chronic kidney disease: Secondary | ICD-10-CM | POA: Diagnosis not present

## 2022-04-08 DIAGNOSIS — M1990 Unspecified osteoarthritis, unspecified site: Secondary | ICD-10-CM | POA: Diagnosis not present

## 2022-04-08 DIAGNOSIS — Z85828 Personal history of other malignant neoplasm of skin: Secondary | ICD-10-CM | POA: Diagnosis not present

## 2022-04-08 DIAGNOSIS — Z7982 Long term (current) use of aspirin: Secondary | ICD-10-CM | POA: Diagnosis not present

## 2022-04-08 DIAGNOSIS — N184 Chronic kidney disease, stage 4 (severe): Secondary | ICD-10-CM | POA: Diagnosis not present

## 2022-04-08 DIAGNOSIS — Z9049 Acquired absence of other specified parts of digestive tract: Secondary | ICD-10-CM | POA: Diagnosis not present

## 2022-04-15 DIAGNOSIS — I4891 Unspecified atrial fibrillation: Secondary | ICD-10-CM | POA: Diagnosis not present

## 2022-04-15 DIAGNOSIS — Z87891 Personal history of nicotine dependence: Secondary | ICD-10-CM | POA: Diagnosis not present

## 2022-04-15 DIAGNOSIS — Z9181 History of falling: Secondary | ICD-10-CM | POA: Diagnosis not present

## 2022-04-15 DIAGNOSIS — Z9049 Acquired absence of other specified parts of digestive tract: Secondary | ICD-10-CM | POA: Diagnosis not present

## 2022-04-15 DIAGNOSIS — N184 Chronic kidney disease, stage 4 (severe): Secondary | ICD-10-CM | POA: Diagnosis not present

## 2022-04-15 DIAGNOSIS — Z85828 Personal history of other malignant neoplasm of skin: Secondary | ICD-10-CM | POA: Diagnosis not present

## 2022-04-15 DIAGNOSIS — Z7982 Long term (current) use of aspirin: Secondary | ICD-10-CM | POA: Diagnosis not present

## 2022-04-15 DIAGNOSIS — M1990 Unspecified osteoarthritis, unspecified site: Secondary | ICD-10-CM | POA: Diagnosis not present

## 2022-04-15 DIAGNOSIS — H409 Unspecified glaucoma: Secondary | ICD-10-CM | POA: Diagnosis not present

## 2022-04-15 DIAGNOSIS — I129 Hypertensive chronic kidney disease with stage 1 through stage 4 chronic kidney disease, or unspecified chronic kidney disease: Secondary | ICD-10-CM | POA: Diagnosis not present

## 2022-04-15 DIAGNOSIS — M800AXD Age-related osteoporosis with current pathological fracture, other site, subsequent encounter for fracture with routine healing: Secondary | ICD-10-CM | POA: Diagnosis not present

## 2022-04-15 DIAGNOSIS — I69351 Hemiplegia and hemiparesis following cerebral infarction affecting right dominant side: Secondary | ICD-10-CM | POA: Diagnosis not present

## 2022-04-17 ENCOUNTER — Emergency Department: Payer: Medicare Other

## 2022-04-17 ENCOUNTER — Emergency Department (HOSPITAL_BASED_OUTPATIENT_CLINIC_OR_DEPARTMENT_OTHER)
Admission: EM | Admit: 2022-04-17 | Discharge: 2022-04-17 | Disposition: A | Discharge status: Home / Self Care | Payer: Medicare Other | Attending: Emergency Medicine | Admitting: Emergency Medicine

## 2022-04-17 ENCOUNTER — Emergency Department (HOSPITAL_BASED_OUTPATIENT_CLINIC_OR_DEPARTMENT_OTHER): Payer: Medicare Other

## 2022-04-17 ENCOUNTER — Other Ambulatory Visit: Payer: Self-pay

## 2022-04-17 ENCOUNTER — Encounter (HOSPITAL_BASED_OUTPATIENT_CLINIC_OR_DEPARTMENT_OTHER): Payer: Self-pay

## 2022-04-17 ENCOUNTER — Other Ambulatory Visit (HOSPITAL_BASED_OUTPATIENT_CLINIC_OR_DEPARTMENT_OTHER): Payer: Self-pay

## 2022-04-17 ENCOUNTER — Ambulatory Visit (INDEPENDENT_AMBULATORY_CARE_PROVIDER_SITE_OTHER): Payer: Medicare Other | Admitting: Orthopaedic Surgery

## 2022-04-17 ENCOUNTER — Emergency Department (HOSPITAL_BASED_OUTPATIENT_CLINIC_OR_DEPARTMENT_OTHER): Payer: Medicare Other | Admitting: Radiology

## 2022-04-17 DIAGNOSIS — M79605 Pain in left leg: Secondary | ICD-10-CM | POA: Insufficient documentation

## 2022-04-17 DIAGNOSIS — M1710 Unilateral primary osteoarthritis, unspecified knee: Secondary | ICD-10-CM

## 2022-04-17 DIAGNOSIS — Z7982 Long term (current) use of aspirin: Secondary | ICD-10-CM | POA: Diagnosis not present

## 2022-04-17 DIAGNOSIS — M1712 Unilateral primary osteoarthritis, left knee: Secondary | ICD-10-CM | POA: Diagnosis not present

## 2022-04-17 DIAGNOSIS — M25562 Pain in left knee: Secondary | ICD-10-CM | POA: Diagnosis not present

## 2022-04-17 DIAGNOSIS — M1180 Other specified crystal arthropathies, unspecified site: Secondary | ICD-10-CM | POA: Diagnosis not present

## 2022-04-17 MED ORDER — LIDOCAINE HCL 1 % IJ SOLN
4.0000 mL | INTRAMUSCULAR | Status: AC | PRN
  Administered 2022-04-17: 4 mL

## 2022-04-17 MED ORDER — TRAMADOL HCL 50 MG PO TABS: 50.0000 mg | ORAL_TABLET | Freq: Four times a day (QID) | ORAL | 0 refills | Status: DC | PRN

## 2022-04-17 MED ORDER — TRAMADOL HCL 50 MG PO TABS
50.0000 mg | ORAL_TABLET | Freq: Four times a day (QID) | ORAL | 0 refills | Status: AC | PRN
  Filled 2022-04-17: qty 30, 7d supply, fill #0

## 2022-04-17 MED ORDER — TRIAMCINOLONE ACETONIDE 40 MG/ML IJ SUSP
80.0000 mg | INTRAMUSCULAR | Status: AC | PRN
  Administered 2022-04-17: 80 mg via INTRA_ARTICULAR

## 2022-04-17 MED ORDER — HYDROCODONE-ACETAMINOPHEN 5-325 MG PO TABS
2.0000 | ORAL_TABLET | Freq: Once | ORAL | Status: AC
  Administered 2022-04-17: 2 via ORAL
  Filled 2022-04-17: qty 2

## 2022-04-17 MED ORDER — DICLOFENAC SODIUM 1 % EX GEL: 4.0000 g | Freq: Four times a day (QID) | CUTANEOUS | 0 refills | Status: DC

## 2022-04-17 NOTE — ED Notes (Signed)
Assisted pt to the bathroom via wheelchair

## 2022-04-17 NOTE — ED Provider Notes (Signed)
Patient initially seen by Dr. Lanny Cramp.  Please see his note.  Plan was to follow-up on the Doppler study.  No evidence of DVT.  Will treat for arthritis knee pain.  Outpatient follow-up with her orthopedist   Dorie Rank, MD 04/17/22 [phone]

## 2022-04-17 NOTE — ED Provider Notes (Signed)
[facility] EMERGENCY DEPT Provider Note   CSN: [id number] Arrival date & time: 04/17/22  0109     History  Chief Complaint  Patient presents with   Leg Pain    Laurie [name] is a 88 y.o. female.  Patient is an 88 year old female presenting with complaints of left leg pain.  This started three days ago in the absence of any injury or trauma.  She describes a constant ache to the knee and calf that is worse she attempts to ambulate or move.  She has had issues with arthritis in the knee in the past and has received cortisone injections in the past, but this feels different.  No chest pain or shortness of breath.  No fevers or chills.  The history is provided by the patient.  Leg Pain Location:  Knee Injury: no   Knee location:  L knee      Home Medications Prior to Admission medications   Medication Sig Start Date End Date Taking? Authorizing Provider  acetaminophen (TYLENOL) 500 MG tablet Take 1,000 mg by mouth every 6 (six) hours as needed.    [provider]  aspirin EC 81 MG tablet Take 1 tablet (81 mg total) by mouth daily. Swallow whole. 12/31/21   Danford, Suann Larry, MD  atorvastatin (LIPITOR) 40 MG tablet Take 1 tablet (40 mg total) by mouth daily. 12/31/21   Danford, Suann Larry, MD  Atropine Sulfate 0.05 % SOLN Apply to eye.    [provider]  brimonidine (ALPHAGAN) 0.2 % ophthalmic solution Place 1 drop into both eyes 3 (three) times daily. 12/23/21   [provider]  dorzolamide-timolol (COSOPT) 22.3-6.8 MG/ML ophthalmic solution 1 drop 2 (two) times daily.      [provider]  fluorouracil (EFUDEX) 5 % cream Apply 1 application. topically daily. 12/23/21   [provider]  latanoprost (XALATAN) 0.005 % ophthalmic solution Place 1 drop into both eyes every evening. 12/23/21   [provider]  Multiple Vitamins-Minerals (PRESERVISION AREDS 2) CAPS Take 1 tablet by mouth 2 (two) times daily.  12/23/21   [provider]  OPTIVE 0.5-0.9 % ophthalmic solution Place 1 drop into both eyes 4 (four) times daily as needed for dry eyes. 12/23/21   [provider]  prednisoLONE acetate (PRED MILD) 0.12 % ophthalmic suspension 1 drop 3 (three) times daily.    [provider]  RHOPRESSA 0.02 % SOLN Place 1 drop into both eyes every evening. 12/23/21   [provider]  Travoprost, BAK Free, (TRAVATAMN) 0.004 % SOLN ophthalmic solution Place 1 drop into both eyes at bedtime.     [provider]      Allergies    Actonel [risedronate], Fosamax [alendronate], Miacalcin, Neomycin, Neosporin [neomycin-polymyxin-gramicidin], Neurontin [gabapentin], and Codeine    Review of Systems   Review of Systems  All other systems reviewed and are negative.   Physical Exam Updated Vital Signs BP (!) 191/63 (BP Location: Right Arm)   Pulse 67   Temp 97.8 F (36.6 C) (Oral)   Resp 20   Ht '4\' 11"'$  (1.499 m)   Wt 59 kg   SpO2 98%   BMI 26.26 kg/m  Physical Exam Vitals and nursing note reviewed.  Constitutional:      General: She is not in acute distress.    Appearance: Normal appearance. She is not ill-appearing.  HENT:     Head: Normocephalic and atraumatic.  Pulmonary:     Effort: Pulmonary effort is  normal.  Musculoskeletal:     Comments: The left knee is grossly normal in appearance.  There is no definitive effusion, erythema, or warmth.  She does have pain with range of motion but no crepitus.  There is also tenderness in the left calf, but no palpable abnormality.  DP pulses are palpable and motor and sensation are intact to the leg distally and foot.  Homans sign absent.  Skin:    General: Skin is warm and dry.  Neurological:     Mental Status: She is alert and oriented to person, place, and time.     ED Results / Procedures / Treatments   Labs (all labs ordered are listed, but only abnormal results are displayed) Labs Reviewed - No data to  display  EKG None  Radiology No results found.  Procedures Procedures    Medications Ordered in ED Medications - No data to display  ED Course/ Medical Decision Making/ A&P  Patient presenting with complaints of left leg pain as described in the HPI.  She describes pain in the knee and calf.  This started 3 days ago, but rapidly worsened this morning.  She denies any injury or trauma.  On exam, vitals are stable.  The knee is grossly normal in appearance.  I palpate no definitive effusion and the knee seems stable without significant laxity.  She does have some pain with range of motion, but pain is most notable in the soft tissues of the calf.  X-rays show degenerative changes, but nothing acute.  Patient will also undergo ultrasound to rule out DVT versus Baker's cyst.  Ultrasound does not arrive here until 8 AM.  She will undergo the study at that time.  Care will be signed out to oncoming provider at shift change to obtain the results of the ultrasound and determine the final disposition.  Final Clinical Impression(s) / ED Diagnoses Final diagnoses:  None    Rx / DC Orders ED Discharge Orders     None         Veryl Speak, MD 04/17/22 [phone]

## 2022-04-17 NOTE — Discharge Instructions (Signed)
Ultrasound did not show any blood clot in your leg.  The x-rays did show some signs of arthritis.  Take the medication as prescribed to help with your knee pain.  Follow-up with your orthopedic doctor for further evaluation

## 2022-04-17 NOTE — Progress Notes (Signed)
Chief Complaint: Left knee pain     History of Present Illness:    Laurie [name] is a 88 y.o. female presents today with atraumatic left hand that has been going on for approximately 1 day.  She presented to the emergency room and x-rays were obtained which did not show any fracture.  She did not have any specific injury.  She is complaining of diffuse knee pain and swelling.   Surgical History:   None  PMH/PSH/Family History/Social History/Meds/Allergies:    Past Medical History:  Diagnosis Date  . CKD (chronic kidney disease), stage III ([place])   . Family history of breast cancer    mother  . Family history of colon cancer    father  . Glaucoma   . Osteopenia   . SBO (small bowel obstruction) ([facility])    Past Surgical History:  Procedure Laterality Date  . APPENDECTOMY    . CESAREAN SECTION     x6  . ESOPHAGOGASTRODUODENOSCOPY  07/07/2012   Procedure: ESOPHAGOGASTRODUODENOSCOPY (EGD);  Surgeon: Arta Silence, MD;  Location: [facility] ENDOSCOPY;  Service: Endoscopy;  Laterality: N/A;  . GLAUCOMA SURGERY Bilateral 02/17/2022  . LEFT COLECTOMY  04/30/2011   Sr [facility]  . tubal cyst  04/30/2011   L fallopian tube cyst incidentally found   Social History   Socioeconomic History  . Marital status: Widowed    Spouse name: Not on file  . Number of children: Not on file  . Years of education: Not on file  . Highest education level: Not on file  Occupational History  . Not on file  Tobacco Use  . Smoking status: Former    Packs/day: 0.50    Types: Cigarettes  . Smokeless tobacco: Not on file  Vaping Use  . Vaping Use: Not on file  Substance and Sexual Activity  . Alcohol use: No    Comment: rare  . Drug use: No  . Sexual activity: Not Currently  Other Topics Concern  . Not on file  Social History Narrative  . Not on file   Social Determinants of Health   Financial Resource Strain: Not on file  Food Insecurity: Not on file   Transportation Needs: Not on file  Physical Activity: Not on file  Stress: Not on file  Social Connections: Not on file   Family History  Problem Relation Age of Onset  . Breast cancer Mother   . Colon cancer Father   . Diabetes Sister    Allergies  Allergen Reactions  . Actonel [Risedronate] Nausea And Vomiting  . Fosamax [Alendronate] Nausea And Vomiting  . Miacalcin Other (See Comments)    Unknown reaction  . Neomycin Other (See Comments)    Unknown reaction  . Neosporin [Neomycin-Polymyxin-Gramicidin] Swelling  . Neurontin [Gabapentin] Other (See Comments)    Confusion  . Codeine Palpitations   Current Outpatient Medications  Medication Sig Dispense Refill  . acetaminophen (TYLENOL) 500 MG tablet Take 1,000 mg by mouth every 6 (six) hours as needed.    Marland Kitchen aspirin EC 81 MG tablet Take 1 tablet (81 mg total) by mouth daily. Swallow whole. 30 tablet 12  . atorvastatin (LIPITOR) 40 MG tablet Take 1 tablet (40 mg total) by mouth daily. 30 tablet 11  . Atropine Sulfate 0.05 % SOLN Apply to eye.    Marland Kitchen  brimonidine (ALPHAGAN) 0.2 % ophthalmic solution Place 1 drop into both eyes 3 (three) times daily.    . diclofenac Sodium (VOLTAREN ARTHRITIS PAIN) 1 % GEL Apply 4 g topically 4 (four) times daily. 100 g 0  . dorzolamide-timolol (COSOPT) 22.3-6.8 MG/ML ophthalmic solution 1 drop 2 (two) times daily.      . fluorouracil (EFUDEX) 5 % cream Apply 1 application. topically daily.    Marland Kitchen latanoprost (XALATAN) 0.005 % ophthalmic solution Place 1 drop into both eyes every evening.    . Multiple Vitamins-Minerals (PRESERVISION AREDS 2) CAPS Take 1 tablet by mouth 2 (two) times daily.    . OPTIVE 0.5-0.9 % ophthalmic solution Place 1 drop into both eyes 4 (four) times daily as needed for dry eyes.    . prednisoLONE acetate (PRED MILD) 0.12 % ophthalmic suspension 1 drop 3 (three) times daily.    . RHOPRESSA 0.02 % SOLN Place 1 drop into both eyes every evening.    . traMADol (ULTRAM) 50 MG  tablet Take 1 tablet (50 mg total) by mouth every 6 (six) hours as needed for up to 14 days. 30 tablet 0  . Travoprost, BAK Free, (TRAVATAMN) 0.004 % SOLN ophthalmic solution Place 1 drop into both eyes at bedtime.      No current facility-administered medications for this visit.   US Venous Img Lower Unilateral Left (DVT)  Result Date: 04/17/2022 CLINICAL DATA:  Left leg pain EXAM: LEFT LOWER EXTREMITY VENOUS DOPPLER ULTRASOUND TECHNIQUE: Gray-scale sonography with graded compression, as well as color Doppler and duplex ultrasound were performed to evaluate the lower extremity deep venous systems from the level of the common femoral vein and including the common femoral, femoral, profunda femoral, popliteal and calf veins including the posterior tibial, peroneal and gastrocnemius veins when visible. The superficial great saphenous vein was also interrogated. Spectral Doppler was utilized to evaluate flow at rest and with distal augmentation maneuvers in the common femoral, femoral and popliteal veins. COMPARISON:  None Available. FINDINGS: Contralateral Common Femoral Vein: Respiratory phasicity is normal and symmetric with the symptomatic side. No evidence of thrombus. Normal compressibility. Common Femoral Vein: No evidence of thrombus. Normal compressibility, respiratory phasicity and response to augmentation. Saphenofemoral Junction: No evidence of thrombus. Normal compressibility and flow on color Doppler imaging. Profunda Femoral Vein: No evidence of thrombus. Normal compressibility and flow on color Doppler imaging. Femoral Vein: No evidence of thrombus. Normal compressibility, respiratory phasicity and response to augmentation. Popliteal Vein: No evidence of thrombus. Normal compressibility, respiratory phasicity and response to augmentation. Calf Veins: Posterior tibial vein appears patent. Peroneal vein not well visualized. No gross calf thrombus appreciated. Superficial Great Saphenous Vein: No  evidence of thrombus. Normal compressibility. Other Findings:  Peripheral subcutaneous edema noted. IMPRESSION: No significant left lower extremity DVT. Limited assessment of the calf veins. Peripheral subcutaneous edema. Electronically Signed   By: Jerilynn Mages.  Shick M.D.   On: 04/17/2022 08:47   DG Knee Complete 4 Views Left  Result Date: 04/17/2022 CLINICAL DATA:  Left knee pain. EXAM: LEFT KNEE - COMPLETE 4+ VIEW COMPARISON:  AP Lat 01/25/2018 FINDINGS: Small suprapatellar bursal effusion is unchanged. Osteopenia. Subcutaneous edema noted in the soft tissues. Tricompartmental degenerative arthrosis is seen, consisting of bone-on-bone patellofemoral joint space loss with moderately prominent osteophytes and mild-to-moderate femorotibial joint space loss and osteophytosis. There is meniscal chondrocalcinosis as well which was also seen previously. No loose body is seen or evidence of fractures. Similar findings were present previously including enthesopathic changes of the patella. IMPRESSION:  Osteopenia, degenerative changes and chondrocalcinosis, without evidence of fractures. No interval change compared with 01/25/2018. Electronically Signed   By: Telford Nab M.D.   On: 04/17/2022 06:47    Review of Systems:   A ROS was performed including pertinent positives and negatives as documented in the HPI.  Physical Exam :   Constitutional: NAD and appears stated age Neurological: Alert and oriented Psych: Appropriate affect and cooperative There were no vitals taken for this visit.   Comprehensive Musculoskeletal Exam:    Tenderness about the left knee diffusely.  There is effusion.  Range of motion is from 0 to 90 degrees with some pain.  Imaging:   Xray (3 views right shoulder, 2 views right ribs): Normal  X-ray left knee 4 views; There is calcification of bilateral menisci consistent with pseudogout in the setting of tricompartmental osteoarthritis  I personally reviewed and interpreted the  radiographs.   Assessment:   88 y.o. female with with left knee pseudogout.  At today's visit I recommended ultrasound-guided injection to hopefully help with pain relief.  She will return to clinic as needed Plan :    -Left knee ultrasound-guided injection performed after verbal consent obtained    Procedure Note  Patient: Laurie [name]             Date of Birth: 1932-01-16           MRN: [id number]             Visit Date: 04/17/2022  Procedures: Visit Diagnoses: No diagnosis found.  Large Joint Inj: L knee on 04/17/2022 3:23 PM Indications: pain Details: 22 G 1.5 in needle, ultrasound-guided anterior approach  Arthrogram: No  Medications: 4 mL lidocaine 1 %; 80 mg triamcinolone acetonide 40 MG/ML Outcome: tolerated well, no immediate complications Procedure, treatment alternatives, risks and benefits explained, specific risks discussed. Consent was given by the patient. Immediately prior to procedure a time out was called to verify the correct patient, procedure, equipment, support staff and site/side marked as required. Patient was prepped and draped in the usual sterile fashion.        I personally saw and evaluated the patient, and participated in the management and treatment plan.  Vanetta Mulders, MD Attending Physician, Orthopedic Surgery  This document was dictated using Dragon voice recognition software. A reasonable attempt at proof reading has been made to minimize errors.

## 2022-04-17 NOTE — ED Triage Notes (Signed)
Pt reports L left pain since Wednesday. Pt reports pain worse over night and was unable to sleep. Pt arrived with 3 salonpas patches on below left knee with no relief and were removed by son during triage. Some swelling noted to left knee. Pedal pulses +2 bilaterally. Pt reports pain radiating from L knee to L ankle. Pt denies injury. Pt took tylenol around 0300.

## 2022-04-20 DIAGNOSIS — Z85828 Personal history of other malignant neoplasm of skin: Secondary | ICD-10-CM | POA: Diagnosis not present

## 2022-04-20 DIAGNOSIS — M1990 Unspecified osteoarthritis, unspecified site: Secondary | ICD-10-CM | POA: Diagnosis not present

## 2022-04-20 DIAGNOSIS — Z9181 History of falling: Secondary | ICD-10-CM | POA: Diagnosis not present

## 2022-04-20 DIAGNOSIS — N184 Chronic kidney disease, stage 4 (severe): Secondary | ICD-10-CM | POA: Diagnosis not present

## 2022-04-20 DIAGNOSIS — M800AXD Age-related osteoporosis with current pathological fracture, other site, subsequent encounter for fracture with routine healing: Secondary | ICD-10-CM | POA: Diagnosis not present

## 2022-04-20 DIAGNOSIS — I4891 Unspecified atrial fibrillation: Secondary | ICD-10-CM | POA: Diagnosis not present

## 2022-04-20 DIAGNOSIS — I129 Hypertensive chronic kidney disease with stage 1 through stage 4 chronic kidney disease, or unspecified chronic kidney disease: Secondary | ICD-10-CM | POA: Diagnosis not present

## 2022-04-20 DIAGNOSIS — Z9049 Acquired absence of other specified parts of digestive tract: Secondary | ICD-10-CM | POA: Diagnosis not present

## 2022-04-20 DIAGNOSIS — Z87891 Personal history of nicotine dependence: Secondary | ICD-10-CM | POA: Diagnosis not present

## 2022-04-20 DIAGNOSIS — I69351 Hemiplegia and hemiparesis following cerebral infarction affecting right dominant side: Secondary | ICD-10-CM | POA: Diagnosis not present

## 2022-04-20 DIAGNOSIS — H409 Unspecified glaucoma: Secondary | ICD-10-CM | POA: Diagnosis not present

## 2022-04-20 DIAGNOSIS — Z7982 Long term (current) use of aspirin: Secondary | ICD-10-CM | POA: Diagnosis not present

## 2022-04-27 DIAGNOSIS — M800AXD Age-related osteoporosis with current pathological fracture, other site, subsequent encounter for fracture with routine healing: Secondary | ICD-10-CM | POA: Diagnosis not present

## 2022-04-27 DIAGNOSIS — I4891 Unspecified atrial fibrillation: Secondary | ICD-10-CM | POA: Diagnosis not present

## 2022-04-27 DIAGNOSIS — Z7982 Long term (current) use of aspirin: Secondary | ICD-10-CM | POA: Diagnosis not present

## 2022-04-27 DIAGNOSIS — M1990 Unspecified osteoarthritis, unspecified site: Secondary | ICD-10-CM | POA: Diagnosis not present

## 2022-04-27 DIAGNOSIS — I129 Hypertensive chronic kidney disease with stage 1 through stage 4 chronic kidney disease, or unspecified chronic kidney disease: Secondary | ICD-10-CM | POA: Diagnosis not present

## 2022-04-27 DIAGNOSIS — Z87891 Personal history of nicotine dependence: Secondary | ICD-10-CM | POA: Diagnosis not present

## 2022-04-27 DIAGNOSIS — Z9181 History of falling: Secondary | ICD-10-CM | POA: Diagnosis not present

## 2022-04-27 DIAGNOSIS — Z85828 Personal history of other malignant neoplasm of skin: Secondary | ICD-10-CM | POA: Diagnosis not present

## 2022-04-27 DIAGNOSIS — I69351 Hemiplegia and hemiparesis following cerebral infarction affecting right dominant side: Secondary | ICD-10-CM | POA: Diagnosis not present

## 2022-04-27 DIAGNOSIS — Z9049 Acquired absence of other specified parts of digestive tract: Secondary | ICD-10-CM | POA: Diagnosis not present

## 2022-04-27 DIAGNOSIS — N184 Chronic kidney disease, stage 4 (severe): Secondary | ICD-10-CM | POA: Diagnosis not present

## 2022-04-27 DIAGNOSIS — H409 Unspecified glaucoma: Secondary | ICD-10-CM | POA: Diagnosis not present

## 2022-05-06 DIAGNOSIS — I4891 Unspecified atrial fibrillation: Secondary | ICD-10-CM | POA: Diagnosis not present

## 2022-05-06 DIAGNOSIS — Z9181 History of falling: Secondary | ICD-10-CM | POA: Diagnosis not present

## 2022-05-06 DIAGNOSIS — I69351 Hemiplegia and hemiparesis following cerebral infarction affecting right dominant side: Secondary | ICD-10-CM | POA: Diagnosis not present

## 2022-05-06 DIAGNOSIS — Z85828 Personal history of other malignant neoplasm of skin: Secondary | ICD-10-CM | POA: Diagnosis not present

## 2022-05-06 DIAGNOSIS — M800AXD Age-related osteoporosis with current pathological fracture, other site, subsequent encounter for fracture with routine healing: Secondary | ICD-10-CM | POA: Diagnosis not present

## 2022-05-06 DIAGNOSIS — M1990 Unspecified osteoarthritis, unspecified site: Secondary | ICD-10-CM | POA: Diagnosis not present

## 2022-05-06 DIAGNOSIS — H409 Unspecified glaucoma: Secondary | ICD-10-CM | POA: Diagnosis not present

## 2022-05-06 DIAGNOSIS — Z87891 Personal history of nicotine dependence: Secondary | ICD-10-CM | POA: Diagnosis not present

## 2022-05-06 DIAGNOSIS — Z9049 Acquired absence of other specified parts of digestive tract: Secondary | ICD-10-CM | POA: Diagnosis not present

## 2022-05-06 DIAGNOSIS — N184 Chronic kidney disease, stage 4 (severe): Secondary | ICD-10-CM | POA: Diagnosis not present

## 2022-05-06 DIAGNOSIS — Z7982 Long term (current) use of aspirin: Secondary | ICD-10-CM | POA: Diagnosis not present

## 2022-05-06 DIAGNOSIS — I129 Hypertensive chronic kidney disease with stage 1 through stage 4 chronic kidney disease, or unspecified chronic kidney disease: Secondary | ICD-10-CM | POA: Diagnosis not present

## 2022-05-25 DIAGNOSIS — Z23 Encounter for immunization: Secondary | ICD-10-CM | POA: Diagnosis not present

## 2022-05-25 DIAGNOSIS — R609 Edema, unspecified: Secondary | ICD-10-CM | POA: Diagnosis not present

## 2022-06-05 ENCOUNTER — Emergency Department (HOSPITAL_BASED_OUTPATIENT_CLINIC_OR_DEPARTMENT_OTHER): Payer: Medicare Other | Admitting: Radiology

## 2022-06-05 ENCOUNTER — Emergency Department (HOSPITAL_BASED_OUTPATIENT_CLINIC_OR_DEPARTMENT_OTHER): Payer: Medicare Other

## 2022-06-05 ENCOUNTER — Inpatient Hospital Stay (HOSPITAL_BASED_OUTPATIENT_CLINIC_OR_DEPARTMENT_OTHER)
Admission: EM | Admit: 2022-06-05 | Discharge: 2022-06-09 | DRG: 322 | Disposition: A | Discharge status: Home / Self Care | Payer: Medicare Other | Attending: Internal Medicine | Admitting: Internal Medicine

## 2022-06-05 ENCOUNTER — Other Ambulatory Visit: Payer: Self-pay

## 2022-06-05 ENCOUNTER — Encounter (HOSPITAL_COMMUNITY): Payer: Self-pay

## 2022-06-05 DIAGNOSIS — Z8673 Personal history of transient ischemic attack (TIA), and cerebral infarction without residual deficits: Secondary | ICD-10-CM | POA: Diagnosis not present

## 2022-06-05 DIAGNOSIS — Z833 Family history of diabetes mellitus: Secondary | ICD-10-CM

## 2022-06-05 DIAGNOSIS — Z800 Family history of malignant neoplasm of digestive organs: Secondary | ICD-10-CM

## 2022-06-05 DIAGNOSIS — M542 Cervicalgia: Secondary | ICD-10-CM | POA: Diagnosis present

## 2022-06-05 DIAGNOSIS — E785 Hyperlipidemia, unspecified: Secondary | ICD-10-CM | POA: Diagnosis not present

## 2022-06-05 DIAGNOSIS — I214 Non-ST elevation (NSTEMI) myocardial infarction: Secondary | ICD-10-CM | POA: Diagnosis not present

## 2022-06-05 DIAGNOSIS — K449 Diaphragmatic hernia without obstruction or gangrene: Secondary | ICD-10-CM | POA: Diagnosis not present

## 2022-06-05 DIAGNOSIS — Z955 Presence of coronary angioplasty implant and graft: Secondary | ICD-10-CM

## 2022-06-05 DIAGNOSIS — R8290 Unspecified abnormal findings in urine: Secondary | ICD-10-CM | POA: Insufficient documentation

## 2022-06-05 DIAGNOSIS — Z79899 Other long term (current) drug therapy: Secondary | ICD-10-CM | POA: Diagnosis not present

## 2022-06-05 DIAGNOSIS — Z7982 Long term (current) use of aspirin: Secondary | ICD-10-CM

## 2022-06-05 DIAGNOSIS — R778 Other specified abnormalities of plasma proteins: Secondary | ICD-10-CM | POA: Diagnosis not present

## 2022-06-05 DIAGNOSIS — I10 Essential (primary) hypertension: Secondary | ICD-10-CM | POA: Diagnosis not present

## 2022-06-05 DIAGNOSIS — R7989 Other specified abnormal findings of blood chemistry: Secondary | ICD-10-CM | POA: Diagnosis not present

## 2022-06-05 DIAGNOSIS — N1832 Chronic kidney disease, stage 3b: Secondary | ICD-10-CM | POA: Diagnosis not present

## 2022-06-05 DIAGNOSIS — Z9049 Acquired absence of other specified parts of digestive tract: Secondary | ICD-10-CM

## 2022-06-05 DIAGNOSIS — Z87891 Personal history of nicotine dependence: Secondary | ICD-10-CM

## 2022-06-05 DIAGNOSIS — I5030 Unspecified diastolic (congestive) heart failure: Secondary | ICD-10-CM | POA: Diagnosis not present

## 2022-06-05 DIAGNOSIS — Z803 Family history of malignant neoplasm of breast: Secondary | ICD-10-CM

## 2022-06-05 DIAGNOSIS — I129 Hypertensive chronic kidney disease with stage 1 through stage 4 chronic kidney disease, or unspecified chronic kidney disease: Secondary | ICD-10-CM | POA: Diagnosis not present

## 2022-06-05 DIAGNOSIS — I700 Atherosclerosis of aorta: Secondary | ICD-10-CM | POA: Diagnosis not present

## 2022-06-05 DIAGNOSIS — R55 Syncope and collapse: Secondary | ICD-10-CM | POA: Diagnosis not present

## 2022-06-05 DIAGNOSIS — I2510 Atherosclerotic heart disease of native coronary artery without angina pectoris: Secondary | ICD-10-CM | POA: Diagnosis not present

## 2022-06-05 LAB — CBC
HCT: 37.9 % (ref 36.0–46.0)
Hemoglobin: 12.4 g/dL (ref 12.0–15.0)
MCH: 32 pg (ref 26.0–34.0)
MCHC: 32.7 g/dL (ref 30.0–36.0)
MCV: 97.7 fL (ref 80.0–100.0)
Platelets: 256 10*3/uL (ref 150–400)
RBC: 3.88 MIL/uL (ref 3.87–5.11)
RDW: 13.2 % (ref 11.5–15.5)
WBC: 11.6 10*3/uL — ABNORMAL HIGH (ref 4.0–10.5)
nRBC: 0 % (ref 0.0–0.2)

## 2022-06-05 LAB — HEPATIC FUNCTION PANEL
ALT: 11 U/L (ref 0–44)
AST: 17 U/L (ref 15–41)
Albumin: 3.9 g/dL (ref 3.5–5.0)
Alkaline Phosphatase: 60 U/L (ref 38–126)
Bilirubin, Direct: 0.1 mg/dL (ref 0.0–0.2)
Indirect Bilirubin: 0.4 mg/dL (ref 0.3–0.9)
Total Bilirubin: 0.5 mg/dL (ref 0.3–1.2)
Total Protein: 7 g/dL (ref 6.5–8.1)

## 2022-06-05 LAB — URINALYSIS, ROUTINE W REFLEX MICROSCOPIC
Bilirubin Urine: NEGATIVE
Glucose, UA: NEGATIVE mg/dL
Hgb urine dipstick: NEGATIVE
Ketones, ur: NEGATIVE mg/dL
Nitrite: NEGATIVE
Protein, ur: NEGATIVE mg/dL
Specific Gravity, Urine: 1.017 (ref 1.005–1.030)
Trans Epithel, UA: <1
pH: 6 (ref 5.0–8.0)

## 2022-06-05 LAB — BASIC METABOLIC PANEL
Anion gap: 11 (ref 5–15)
BUN: 34 mg/dL — ABNORMAL HIGH (ref 8–23)
CO2: 20 mmol/L — ABNORMAL LOW (ref 22–32)
Calcium: 8.9 mg/dL (ref 8.9–10.3)
Chloride: 107 mmol/L (ref 98–111)
Creatinine, Ser: 1.65 mg/dL — ABNORMAL HIGH (ref 0.44–1.00)
GFR, Estimated: 30 mL/min — ABNORMAL LOW (ref >60)
Glucose, Bld: 116 mg/dL — ABNORMAL HIGH (ref 70–99)
Potassium: 3.9 mmol/L (ref 3.5–5.1)
Sodium: 138 mmol/L (ref 135–145)

## 2022-06-05 LAB — D-DIMER, QUANTITATIVE: D-Dimer, Quant: 2.9 ug/mL-FEU — ABNORMAL HIGH (ref 0.00–0.50)

## 2022-06-05 LAB — MAGNESIUM: Magnesium: 2 mg/dL (ref 1.7–2.4)

## 2022-06-05 LAB — BRAIN NATRIURETIC PEPTIDE: B Natriuretic Peptide: 95.7 pg/mL (ref 0.0–100.0)

## 2022-06-05 LAB — TROPONIN I (HIGH SENSITIVITY)
Troponin I (High Sensitivity): 8 ng/L (ref <18)
Troponin I (High Sensitivity): 25 ng/L — ABNORMAL HIGH (ref <18)
Troponin I (High Sensitivity): 796 ng/L (ref <18)

## 2022-06-05 MED ORDER — LATANOPROST 0.005 % OP SOLN
1.0000 [drp] | Freq: Every day | OPHTHALMIC | Status: DC
  Administered 2022-06-06 – 2022-06-08 (×4): 1 [drp] via OPHTHALMIC
  Filled 2022-06-05: qty 2.5

## 2022-06-05 MED ORDER — IOHEXOL 350 MG/ML SOLN
80.0000 mL | Freq: Once | INTRAVENOUS | Status: AC | PRN
  Administered 2022-06-05: 80 mL via INTRAVENOUS

## 2022-06-05 MED ORDER — NITROGLYCERIN 0.4 MG SL SUBL
0.4000 mg | SUBLINGUAL_TABLET | SUBLINGUAL | Status: AC | PRN
  Administered 2022-06-05 – 2022-06-07 (×3): 0.4 mg via SUBLINGUAL
  Filled 2022-06-05 (×4): qty 1

## 2022-06-05 MED ORDER — HYDRALAZINE HCL 20 MG/ML IJ SOLN: 10.0000 mg | INTRAMUSCULAR | Status: DC | PRN

## 2022-06-05 MED ORDER — ACETAMINOPHEN 325 MG PO TABS
650.0000 mg | ORAL_TABLET | Freq: Four times a day (QID) | ORAL | Status: DC | PRN
  Administered 2022-06-05 – 2022-06-07 (×2): 650 mg via ORAL
  Filled 2022-06-05 (×3): qty 2

## 2022-06-05 MED ORDER — ACETAMINOPHEN 325 MG PO TABS
650.0000 mg | ORAL_TABLET | Freq: Once | ORAL | Status: AC
  Administered 2022-06-05: 650 mg via ORAL
  Filled 2022-06-05: qty 2

## 2022-06-05 MED ORDER — NETARSUDIL DIMESYLATE 0.02 % OP SOLN: 1.0000 [drp] | Freq: Every evening | OPHTHALMIC | Status: DC

## 2022-06-05 MED ORDER — DORZOLAMIDE HCL-TIMOLOL MAL 2-0.5 % OP SOLN
1.0000 [drp] | Freq: Two times a day (BID) | OPHTHALMIC | Status: DC
  Administered 2022-06-06 – 2022-06-09 (×8): 1 [drp] via OPHTHALMIC
  Filled 2022-06-05 (×2): qty 10

## 2022-06-05 MED ORDER — ALUM & MAG HYDROXIDE-SIMETH 200-200-20 MG/5ML PO SUSP
30.0000 mL | Freq: Once | ORAL | Status: AC
  Administered 2022-06-05: 30 mL via ORAL
  Filled 2022-06-05: qty 30

## 2022-06-05 MED ORDER — ASPIRIN 81 MG PO CHEW
324.0000 mg | CHEWABLE_TABLET | Freq: Once | ORAL | Status: AC
  Administered 2022-06-05: 324 mg via ORAL
  Filled 2022-06-05: qty 4

## 2022-06-05 MED ORDER — ATORVASTATIN CALCIUM 40 MG PO TABS
40.0000 mg | ORAL_TABLET | Freq: Every day | ORAL | Status: DC
  Administered 2022-06-06 – 2022-06-09 (×5): 40 mg via ORAL
  Filled 2022-06-05 (×5): qty 1

## 2022-06-05 MED ORDER — FENTANYL CITRATE PF 50 MCG/ML IJ SOSY: 50.0000 ug | PREFILLED_SYRINGE | Freq: Once | INTRAMUSCULAR | Status: DC

## 2022-06-05 MED ORDER — ASPIRIN 81 MG PO TBEC
81.0000 mg | DELAYED_RELEASE_TABLET | Freq: Every day | ORAL | Status: DC
  Administered 2022-06-06 – 2022-06-09 (×3): 81 mg via ORAL
  Filled 2022-06-05 (×3): qty 1

## 2022-06-05 MED ORDER — HEPARIN (PORCINE) 25000 UT/250ML-% IV SOLN
700.0000 [IU]/h | INTRAVENOUS | Status: DC
  Administered 2022-06-06 – 2022-06-07 (×2): 700 [IU]/h via INTRAVENOUS
  Filled 2022-06-05 (×2): qty 250

## 2022-06-05 MED ORDER — LATANOPROST 0.005 % OP SOLN: 1.0000 [drp] | Freq: Every evening | OPHTHALMIC | Status: DC

## 2022-06-05 MED ORDER — ASPIRIN 81 MG PO CHEW: 324.0000 mg | CHEWABLE_TABLET | Freq: Once | ORAL | Status: DC

## 2022-06-05 MED ORDER — AMLODIPINE BESYLATE 2.5 MG PO TABS
2.5000 mg | ORAL_TABLET | Freq: Every day | ORAL | Status: DC
  Administered 2022-06-05 – 2022-06-09 (×5): 2.5 mg via ORAL
  Filled 2022-06-05 (×5): qty 1

## 2022-06-05 MED ORDER — HEPARIN BOLUS VIA INFUSION
3500.0000 [IU] | Freq: Once | INTRAVENOUS | Status: AC
  Administered 2022-06-06: 3500 [IU] via INTRAVENOUS
  Filled 2022-06-05: qty 3500

## 2022-06-05 MED ORDER — METOPROLOL TARTRATE 25 MG PO TABS: 25.0000 mg | ORAL_TABLET | Freq: Once | ORAL | Status: DC

## 2022-06-05 MED ORDER — CARBOXYMETHYLCELLUL-GLYCERIN 0.5-0.9 % OP SOLN: 1.0000 [drp] | Freq: Four times a day (QID) | OPHTHALMIC | Status: DC | PRN

## 2022-06-05 MED ORDER — HEPARIN SODIUM (PORCINE) 5000 UNIT/ML IJ SOLN
5000.0000 [IU] | Freq: Three times a day (TID) | INTRAMUSCULAR | Status: DC
  Filled 2022-06-05: qty 1

## 2022-06-05 MED ORDER — OXYCODONE-ACETAMINOPHEN 5-325 MG PO TABS
1.0000 | ORAL_TABLET | Freq: Once | ORAL | Status: AC
  Administered 2022-06-05: 1 via ORAL
  Filled 2022-06-05: qty 1

## 2022-06-05 MED ORDER — ONDANSETRON HCL 4 MG/2ML IJ SOLN
4.0000 mg | Freq: Once | INTRAMUSCULAR | Status: AC
  Administered 2022-06-05: 4 mg via INTRAVENOUS
  Filled 2022-06-05: qty 2

## 2022-06-05 MED ORDER — LACTATED RINGERS IV BOLUS
1000.0000 mL | Freq: Once | INTRAVENOUS | Status: AC
  Administered 2022-06-05: 1000 mL via INTRAVENOUS

## 2022-06-05 NOTE — ED Notes (Signed)
No current pain, no current N/V, no current lightheaded or feeling faint and no current dizziness.

## 2022-06-05 NOTE — ED Notes (Signed)
Pt stated that she did not want fentanyl. Pt stated that she wanted something else but would not specify what she wanted. Md notified

## 2022-06-05 NOTE — ED Notes (Signed)
Family informed of delay and why she is being transported to [facility]. Family were not happy with delay.

## 2022-06-05 NOTE — ED Triage Notes (Signed)
Pt BIB son Laurie [name] from home. Pt called out stating she felt like she was going to faint at about 2 am. No syncope. Also c/o bilateral upper arm pain, nausea, and generalized weakness since midnight. No falls/injuries. No CP/Abd Pain. No dizziness/lightheaded in triage. Pt had been able to keep down Pedialyte.

## 2022-06-05 NOTE — Consult Note (Signed)
Cardiology Consultation   Patient ID: Laurie [name] MRN: [id number]; DOB: 1931-11-28  Admit date: 06/05/2022 Date of Consult: 06/05/2022  PCP:  Lawerance Cruel, [place] Providers Cardiologist:  None        Patient Profile:   Laurie [name] is a 88 y.o. female with a hx of HTN (not on medication), HLD, CVA, CKD stage IIIb, history of SBO status post left hemicolectomy, former smoker who is being seen 06/05/2022 for the evaluation of "atypical ACS" at the request of Dr. Marlowe Sax.  History of Present Illness:   Laurie [name] lives with her family.  She states sudden onset nausea and generalized weakness last night followed by bilateral upper arm pain.  Her family brought her to [facility] emergency room. Patient states she was still having intermittent arm pain +/- bilateral jaw pain which seemed improved with SL nitro. The pain appears to be worsened with arm movement. Given her ongoing symptoms, patient's family drove her to our ED. Patient is currently in her bed, complains occasional arm pain but much improved, on room air, no acute distress. Denies fever, chills, syncope, cough, chest discomfort, heart palpitations, dyspnea, abdominal fullness, dysuria, diarrhea, pedal edema or any bleeding events.  On admission, potassium 3.9, creatinine 1.65 (about her baseline), W BC 11.6, hemoglobin 12.4, magnesium 2.0, BNP (-), D-dimer 2.9  (CTA negative for PE or dissection), troponin 8->25-796 BP was elevated on arrival (160-170 SBP) ECG showed sinus rhythm  Past Medical History:  Diagnosis Date   CKD (chronic kidney disease), stage III (HCC)    Family history of breast cancer    mother   Family history of colon cancer    father   Glaucoma    Osteopenia    SBO (small bowel obstruction) ([place])     Past Surgical History:  Procedure Laterality Date   APPENDECTOMY     CESAREAN SECTION     x6   ESOPHAGOGASTRODUODENOSCOPY  07/07/2012   Procedure:  ESOPHAGOGASTRODUODENOSCOPY (EGD);  Surgeon: Arta Silence, MD;  Location: [facility] ENDOSCOPY;  Service: Endoscopy;  Laterality: N/A;   GLAUCOMA SURGERY Bilateral 02/17/2022   LEFT COLECTOMY  04/30/2011   Sr [facility]reck   tubal cyst  04/30/2011   L fallopian tube cyst incidentally found       Inpatient Medications: Scheduled Meds:  amLODipine  2.5 mg Oral Daily   [START ON 06/06/2022] aspirin EC  81 mg Oral Daily   atorvastatin  40 mg Oral Daily   dorzolamide-timolol  1 drop Both Eyes BID   heparin  5,000 Units Subcutaneous Q8H   latanoprost  1 drop Both Eyes QHS   Netarsudil Dimesylate  1 drop Both Eyes QPM   Continuous Infusions:  PRN Meds: acetaminophen, carboxymethylcellul-glycerin, nitroGLYCERIN  Allergies:    Allergies  Allergen Reactions   Actonel [Risedronate] Nausea And Vomiting   Fosamax [Alendronate] Nausea And Vomiting   Miacalcin Other (See Comments)    Unknown reaction   Neomycin Other (See Comments)    Unknown reaction   Neosporin [Neomycin-Polymyxin-Gramicidin] Swelling   Neurontin [Gabapentin] Other (See Comments)    Confusion   Codeine Palpitations    Social History:   Social History   Socioeconomic History   Marital status: Widowed    Spouse name: Not on file   Number of children: Not on file   Years of education: Not on file   Highest education level: Not on file  Occupational History   Not on file  Tobacco Use  Smoking status: Former    Packs/day: 0.50    Types: Cigarettes   Smokeless tobacco: Not on file  Vaping Use   Vaping Use: Not on file  Substance and Sexual Activity   Alcohol use: No    Comment: rare   Drug use: No   Sexual activity: Not Currently  Other Topics Concern   Not on file  Social History Narrative   Not on file   Social Determinants of Health   Financial Resource Strain: Not on file  Food Insecurity: Not on file  Transportation Needs: Not on file  Physical Activity: Not on file  Stress: Not on file  Social  Connections: Not on file  Intimate Partner Violence: Not on file    Family History:    Family History  Problem Relation Age of Onset   Breast cancer Mother    Colon cancer Father    Diabetes Sister      ROS:  Please see the history of present illness.   All other ROS reviewed and negative.     Physical Exam/Data:   Vitals:   06/05/22 1755 06/05/22 1850 06/05/22 1928 06/05/22 2140  BP:  (!) 184/76 (!) 143/63 (!) 177/82  Pulse:  69 65 77  Resp:  20 (!) 21 20  Temp: 98.3 F (36.8 C) 98.5 F (36.9 C) 98 F (36.7 C)   TempSrc: Oral Oral Oral   SpO2:  96% 95% 95%  Weight:  62.2 kg    Height:  '4\' 11"'$  (1.499 m)      Intake/Output Summary (Last 24 hours) at 06/05/2022 2146 Last data filed at 06/05/2022 2119 Gross per 24 hour  Intake 1000 ml  Output 200 ml  Net 800 ml      06/05/2022    6:50 PM 06/05/2022    3:06 AM 04/17/2022    6:07 AM  Last 3 Weights  Weight (lbs) 137 lb 2 oz 130 lb 130 lb  Weight (kg) 62.2 kg 58.968 kg 58.968 kg     Body mass index is 27.7 kg/m.  General:  Well nourished, well developed, in no acute distress HEENT: normal Neck: no JVD Vascular: No carotid bruits; Distal pulses 2+ bilaterally Cardiac:  normal S1, S2; RRR; no murmur  Lungs:  clear to auscultation bilaterally, no wheezing, rhonchi or rales  Abd: soft, nontender, no hepatomegaly  Ext: no edema Musculoskeletal:  No deformities, BUE and BLE strength normal and equal Skin: warm and dry  Neuro:  CNs 2-12 intact, no focal abnormalities noted Psych:  Normal affect   Laboratory Data:  High Sensitivity Troponin:   Recent Labs  Lab 06/05/22 0309 06/05/22 0526 06/05/22 2019  TROPONINIHS 8 25* 796*     Chemistry Recent Labs  Lab 06/05/22 0309  NA 138  K 3.9  CL 107  CO2 20*  GLUCOSE 116*  BUN 34*  CREATININE 1.65*  CALCIUM 8.9  MG 2.0  GFRNONAA 30*  ANIONGAP 11    Recent Labs  Lab 06/05/22 0310  PROT 7.0  ALBUMIN 3.9  AST 17  ALT 11  ALKPHOS 60  BILITOT 0.5    Lipids No results for input(s): "CHOL", "TRIG", "HDL", "LABVLDL", "LDLCALC", "CHOLHDL" in the last 168 hours.  Hematology Recent Labs  Lab 06/05/22 0309  WBC 11.6*  RBC 3.88  HGB 12.4  HCT 37.9  MCV 97.7  MCH 32.0  MCHC 32.7  RDW 13.2  PLT 256   Thyroid No results for input(s): "TSH", "FREET4" in the last 168 hours.  BNP Recent Labs  Lab 06/05/22 0309  BNP 95.7    DDimer  Recent Labs  Lab 06/05/22 0602  DDIMER 2.90*     Radiology/Studies:  CT Angio Chest/Abd/Pel for Dissection W and/or Wo Contrast  Result Date: 06/05/2022 CLINICAL DATA:  88 year old female with pain, nausea, weakness, near syncope. EXAM: CT ANGIOGRAPHY CHEST, ABDOMEN AND PELVIS TECHNIQUE: Non-contrast CT of the chest was initially obtained. Multidetector CT imaging through the chest, abdomen and pelvis was performed using the standard protocol during bolus administration of intravenous contrast. Multiplanar reconstructed images and MIPs were obtained and reviewed to evaluate the vascular anatomy. RADIATION DOSE REDUCTION: This exam was performed according to the departmental dose-optimization program which includes automated exposure control, adjustment of the mA and/or kV according to patient size and/or use of iterative reconstruction technique. CONTRAST:  73m OMNIPAQUE IOHEXOL 350 MG/ML SOLN COMPARISON:  CT Abdomen and Pelvis 01/27/2011.  No prior chest CT. FINDINGS: CTA CHEST FINDINGS Cardiovascular: Mildly tortuous thoracic aorta with calcified atherosclerosis. Left coronary artery calcified plaque and/or stent on series 5, image 26. Cardiac size remains within normal limits. No pericardial effusion. Negative for thoracic aortic aneurysm or dissection. Central pulmonary arteries also well enhanced and appear to be patent. Mediastinum/Nodes: Moderate to large gastric hiatal hernia has progressed since 2012. Negative for mediastinal mass or lymphadenopathy. Lungs/Pleura: Major airways are patent. Mild lung  base atelectasis. No pleural effusion or convincing acute pulmonary inflammation. Musculoskeletal: Mild kyphoscoliosis. Osteopenia. Thoracic spine degeneration. Chronic appearing right anterior 2nd through 7th rib fractures, mostly nondisplaced. No acute osseous abnormality identified. Review of the MIP images confirms the above findings. CTA ABDOMEN AND PELVIS FINDINGS VASCULAR Extensive Aortoiliac calcified atherosclerosis. Negative for abdominal aortic aneurysm or dissection. Major arterial structures in the abdomen and pelvis remain patent. Proximal femoral arteries are patent with atherosclerosis. Review of the MIP images confirms the above findings. NON-VASCULAR Hepatobiliary: Negative liver and gallbladder. Pancreas: Negative. Spleen: Negative. Adrenals/Urinary Tract: Negative adrenal glands. Symmetric bilateral renal cortical atrophy since 2012. Kidneys appear nonobstructed, symmetrically enhancing. Unremarkable bladder. Occasional pelvic phleboliths. Stomach/Bowel: Somewhat limited bowel detail with this technique. No dilated large or small bowel loops. 50% gastric hiatal hernia has progressed since 2012. Intra-abdominal stomach and duodenum appear negative. Abundant large bowel retained stool. No bowel inflammation is identified. Appendix is not clearly delineated. No free air or free fluid identified. Lymphatic: No lymphadenopathy identified. Reproductive: Within normal limits. Other: No pelvic free fluid. Musculoskeletal: Chronic spondylolisthesis in the lower lumbar spine. Chronic degeneration, including severe T12-L1 disc and endplate degeneration superimposed on multilevel lower thoracic chronic interbody ankylosis from flowing osteophytes. No acute osseous abnormality identified. Review of the MIP images confirms the above findings. IMPRESSION: 1. Negative for aortic aneurysm or dissection. Positive for Aortic Atherosclerosis (ICD10-I70.0). 2. Progressed chronic gastric hiatal hernia since 2012, with  roughly half of the stomach now intrathoracic. 3. No superimposed acute or inflammatory process identified in the chest, abdomen, or pelvis. 4. Osteopenia. Chronic appearing right anterior 2nd through 7th rib fractures. Advanced spine degeneration. Electronically Signed   By: HGenevie AnnM.D.   On: 06/05/2022 07:34   DG Chest 2 View  Result Date: 06/05/2022 CLINICAL DATA:  Presyncope. EXAM: CHEST - 2 VIEW COMPARISON:  12/28/2021 FINDINGS: The lungs are clear without focal pneumonia, edema, pneumothorax or pleural effusion. Interstitial markings are diffusely coarsened with chronic features. The cardio pericardial silhouette is enlarged. Small to moderate hiatal hernia evident. Telemetry leads overlie the chest. IMPRESSION: 1. No acute cardiopulmonary findings. 2. Small to moderate  hiatal hernia. Electronically Signed   By: Misty Stanley M.D.   On: 06/05/2022 05:29     Assessment and Plan:   #NSTEMI -atypical ACS presentation, ECG not concerning for ACS, however 3rd troponin was substantially elevated. Given her CAD risk factors, treat as NSTEMI -continue to trend troponin -risk stratification with A1C, TSH and lipid panel -acquire a TTE -continue ASA, statin, no BB given sinus bradycardia now -start heparin gtt -discussed with the patient and her family in terms of possibility of invasive CAD work-up, I.e., LHC. If the patient would like to proceed with LHC, hydration prior to [facility] (potentially Monday)   Risk Assessment/Risk Scores:     TIMI Risk Score for Unstable Angina or Non-ST Elevation MI:   The patient's TIMI risk score is 4, which indicates a 20% risk of all cause mortality, new or recurrent myocardial infarction or need for urgent revascularization in the next 14 days     For questions or updates, please contact [place] Please consult [email or web address] for contact info under    Signed, Laurice Record, MD  06/05/2022 9:46 PM

## 2022-06-05 NOTE — Progress Notes (Signed)
The doctor said it was OK for the Pt a sprite and food. RT gave the Pt some crackers and a sprite

## 2022-06-05 NOTE — ED Provider Notes (Signed)
[place] [facility] EMERGENCY DEPT Provider Note   CSN: [id number] Arrival date & time: 06/05/22  0247     History  Chief Complaint  Patient presents with   Weakness    Laurie [name] is a 88 y.o. female.  88 year old female who presents ER today with multiple symptoms.  Patient initially started feeling unwell around midnight.  She states that she had bilateral upper arm pain neck pain and back pain shortly followed by headache dizziness and near syncopal episode.  No recent illnesses.  No fevers.  She states that improved for a while here but then came back.  She recently had a stroke back in June and has been hypertensive since then which seems to be waxing and waning prior to my evaluation and seems to coincide with her symptoms.  After noticing that her right leg is swollen I discussed with the son who thought that maybe had been swollen for at least the last couple weeks.  She had problems with left leg swelling in the past.  No falls.  No other associated symptoms.  No chest pain.  No shortness of breath.  Did have some nausea.   Weakness      Home Medications Prior to Admission medications   Medication Sig Start Date End Date Taking? Authorizing Provider  acetaminophen (TYLENOL) 500 MG tablet Take 1,000 mg by mouth every 6 (six) hours as needed.    [provider]  aspirin EC 81 MG tablet Take 1 tablet (81 mg total) by mouth daily. Swallow whole. 12/31/21   Danford, Suann Larry, MD  atorvastatin (LIPITOR) 40 MG tablet Take 1 tablet (40 mg total) by mouth daily. 12/31/21   Danford, Suann Larry, MD  Atropine Sulfate 0.05 % SOLN Apply to eye.    [provider]  brimonidine (ALPHAGAN) 0.2 % ophthalmic solution Place 1 drop into both eyes 3 (three) times daily. 12/23/21   [provider]  diclofenac Sodium (VOLTAREN ARTHRITIS PAIN) 1 % GEL Apply 4 g topically 4 (four) times daily. 04/17/22   Dorie Rank, MD  dorzolamide-timolol (COSOPT)  22.3-6.8 MG/ML ophthalmic solution 1 drop 2 (two) times daily.      [provider]  fluorouracil (EFUDEX) 5 % cream Apply 1 application. topically daily. 12/23/21   [provider]  latanoprost (XALATAN) 0.005 % ophthalmic solution Place 1 drop into both eyes every evening. 12/23/21   [provider]  Multiple Vitamins-Minerals (PRESERVISION AREDS 2) CAPS Take 1 tablet by mouth 2 (two) times daily. 12/23/21   [provider]  OPTIVE 0.5-0.9 % ophthalmic solution Place 1 drop into both eyes 4 (four) times daily as needed for dry eyes. 12/23/21   [provider]  prednisoLONE acetate (PRED MILD) 0.12 % ophthalmic suspension 1 drop 3 (three) times daily.    [provider]  RHOPRESSA 0.02 % SOLN Place 1 drop into both eyes every evening. 12/23/21   [provider]  Travoprost, BAK Free, (TRAVATAMN) 0.004 % SOLN ophthalmic solution Place 1 drop into both eyes at bedtime.     [provider]      Allergies    Actonel [risedronate], Fosamax [alendronate], Miacalcin, Neomycin, Neosporin [neomycin-polymyxin-gramicidin], Neurontin [gabapentin], and Codeine    Review of Systems   Review of Systems  Neurological:  Positive for weakness.    Physical Exam Updated Vital Signs BP (!) 173/75   Pulse 68   Temp 97.7 F (36.5 C) (Oral)   Resp (!) 24   Ht '4\' 11"'$  (1.499  m)   Wt 59 kg   SpO2 97%   BMI 26.26 kg/m  Physical Exam Vitals and nursing note reviewed.  Constitutional:      Appearance: She is well-developed.  HENT:     Head: Normocephalic and atraumatic.     Mouth/Throat:     Mouth: Mucous membranes are moist.     Pharynx: Oropharynx is clear.  Eyes:     Pupils: Pupils are equal, round, and reactive to light.  Cardiovascular:     Rate and Rhythm: Normal rate and regular rhythm.  Pulmonary:     Effort: No respiratory distress.     Breath sounds: No stridor.  Abdominal:     General: Abdomen is flat. There is no  distension.  Musculoskeletal:        General: Swelling (RLE withi pitting, LLE with evidence of PVD but no significant edema) present. No tenderness. Normal range of motion.     Cervical back: Normal range of motion.  Skin:    General: Skin is warm and dry.  Neurological:     General: No focal deficit present.     Mental Status: She is alert.     ED Results / Procedures / Treatments   Labs (all labs ordered are listed, but only abnormal results are displayed) Labs Reviewed  BASIC METABOLIC PANEL - Abnormal; Notable for the following components:      Result Value   CO2 20 (*)    Glucose, Bld 116 (*)    BUN 34 (*)    Creatinine, Ser 1.65 (*)    GFR, Estimated 30 (*)    All other components within normal limits  CBC - Abnormal; Notable for the following components:   WBC 11.6 (*)    All other components within normal limits  URINALYSIS, ROUTINE W REFLEX MICROSCOPIC - Abnormal; Notable for the following components:   Leukocytes,Ua MODERATE (*)    All other components within normal limits  D-DIMER, QUANTITATIVE - Abnormal; Notable for the following components:   D-Dimer, Quant 2.90 (*)    All other components within normal limits  TROPONIN I (HIGH SENSITIVITY) - Abnormal; Notable for the following components:   Troponin I (High Sensitivity) 25 (*)    All other components within normal limits  HEPATIC FUNCTION PANEL  MAGNESIUM  BRAIN NATRIURETIC PEPTIDE  CBG MONITORING, ED  TROPONIN I (HIGH SENSITIVITY)    EKG EKG Interpretation  Date/Time:  Friday June 05 2022 03:26:28 EST Ventricular Rate:  58 PR Interval:  162 QRS Duration: 64 QT Interval:  454 QTC Calculation: 445 R Axis:   -37 Text Interpretation: Sinus bradycardia Left axis deviation Septal infarct , age undetermined Abnormal ECG When compared with ECG of 28-Dec-2021 15:10, PREVIOUS ECG IS PRESENT Confirmed by Merrily Pew [phone]) on 06/05/2022 3:53:41 AM  Radiology DG Chest 2 View  Result Date:  06/05/2022 CLINICAL DATA:  Presyncope. EXAM: CHEST - 2 VIEW COMPARISON:  12/28/2021 FINDINGS: The lungs are clear without focal pneumonia, edema, pneumothorax or pleural effusion. Interstitial markings are diffusely coarsened with chronic features. The cardio pericardial silhouette is enlarged. Small to moderate hiatal hernia evident. Telemetry leads overlie the chest. IMPRESSION: 1. No acute cardiopulmonary findings. 2. Small to moderate hiatal hernia. Electronically Signed   By: Misty Stanley M.D.   On: 06/05/2022 05:29    Procedures Procedures    Medications Ordered in ED Medications  nitroGLYCERIN (NITROSTAT) SL tablet 0.4 mg (0.4 mg Sublingual Given 06/05/22 0630)  fentaNYL (SUBLIMAZE) injection 50 mcg (has no  administration in time range)  ondansetron (ZOFRAN) injection 4 mg (4 mg Intravenous Given 06/05/22 0514)  lactated ringers bolus 1,000 mL (0 mLs Intravenous Stopped 06/05/22 0630)  oxyCODONE-acetaminophen (PERCOCET/ROXICET) 5-325 MG per tablet 1 tablet (1 tablet Oral Given 06/05/22 0514)  aspirin chewable tablet 324 mg (324 mg Oral Given 06/05/22 0630)  iohexol (OMNIPAQUE) 350 MG/ML injection 80 mL (80 mLs Intravenous Contrast Given 06/05/22 1423)    ED Course/ Medical Decision Making/ A&P                           Medical Decision Making Amount and/or Complexity of Data Reviewed Labs: ordered. Radiology: ordered.  Risk OTC drugs. Prescription drug management. Decision regarding hospitalization.   Patient intermittently hypertensive which seems to make her symptoms return.  Concern for aortic dissection.  D-dimer is high and in association with that right lower leg concern for possible PE.  Evaluate for both order CT dissection study if her creatinine allows it as she does have a recent history of chronic kidney disease.  Also consider cardiac causes with her age, vascular risk factors and pain in both of her arms.  Will check troponins.  EKG has some artifact but no  obvious STEMI.  First troponin was 8-second was 25.  Although this is not significantly elevated with her symptoms that could be atypical coronary symptoms she Printy is a ACS rule out at the very least.  We will hold on heparin at this time until CVA or dissection study shows.  Is most likely the symptoms are in part related from the hypertension she is having but it improves on its own so I do hesitate starting antihypertensives right now.  Care transferred pending results of the dissection study and ultimate disposition.  Final Clinical Impression(s) / ED Diagnoses Final diagnoses:  None    Rx / DC Orders ED Discharge Orders     None         Aarib Pulido, Corene Cornea, MD 06/05/22 2313

## 2022-06-05 NOTE — ED Notes (Signed)
Laurie [name] will send transport. ABB (NS) 15:21

## 2022-06-05 NOTE — H&P (Signed)
History and Physical    Laurie [name]:[id number] DOB: 09-20-1931 DOA: 06/05/2022  PCP: Lawerance Cruel, MD  Patient coming from: [facility] ED  Chief Complaint: Nausea, arm/mouth pain  HPI: Laurie [name] is a 88 y.o. female with medical history significant of hypertension, hyperlipidemia, CVA, CKD stage IIIb, history of SBO status post left hemicolectomy, former smoker presented to the ED with complaints of sudden onset nausea and pain in neck and jaw.  In the ED, blood pressure elevated with systolic in the 166A.  Heart rate in the high 50s to 60s.  Afebrile.  Not hypoxic.  Labs showing WBC 11.6, bicarb 20, glucose 116, BUN 34, creatinine 1.6 (stable), BNP 95.7, high-sensitivity troponin 8> 25, D-dimer 2.9.  UA with negative nitrite, moderate amount of leukocytes, and microscopy showing 11-20 WBCs.  EKG showing sinus bradycardia (heart rate 58) and no acute ischemic changes.  CTA of chest/abdomen/pelvis negative for dissection or large PE but did show worsening hiatal hernia with half of the stomach now intrathoracic.  Patient transferred to [facility] due to concern for atypical ACS.  She was given GI cocktail, Tylenol, Zofran, sublingual nitroglycerin, aspirin 324 mg, 1 L LR bolus, and oxycodone in the ED.  History provided by the patient and her family at bedside.  Patient's son states yesterday night around midnight she complained of sudden onset nausea, generalized weakness, and pain in both of her upper arms.  Family drove her to [place] emergency room.  While in the emergency room, she continued to have symptoms and was also complaining of pain in her mouth.  Patient denies any chest pain or shortness of breath.  Denies vomiting or abdominal pain.  Denies any GERD symptoms.  Resting comfortably eating dinner.  She continues to complain of pain around her mouth/chin area.  Per family, patient has never been diagnosed with CAD and has never had stress test or  cardiac catheterization in the past.  Review of Systems:  Review of Systems  All other systems reviewed and are negative.   Past Medical History:  Diagnosis Date   CKD (chronic kidney disease), stage III (HCC)    Family history of breast cancer    mother   Family history of colon cancer    father   Glaucoma    Osteopenia    SBO (small bowel obstruction) ([place])     Past Surgical History:  Procedure Laterality Date   APPENDECTOMY     CESAREAN SECTION     x6   ESOPHAGOGASTRODUODENOSCOPY  07/07/2012   Procedure: ESOPHAGOGASTRODUODENOSCOPY (EGD);  Surgeon: Arta Silence, MD;  Location: [facility] ENDOSCOPY;  Service: Endoscopy;  Laterality: N/A;   GLAUCOMA SURGERY Bilateral 02/17/2022   LEFT COLECTOMY  04/30/2011   [facility]   tubal cyst  04/30/2011   L fallopian tube cyst incidentally found     reports that she has quit smoking. Her smoking use included cigarettes. She smoked an average of .5 packs per day. She does not have any smokeless tobacco history on file. She reports that she does not drink alcohol and does not use drugs.  Allergies  Allergen Reactions   Actonel [Risedronate] Nausea And Vomiting   Fosamax [Alendronate] Nausea And Vomiting   Miacalcin Other (See Comments)    Unknown reaction   Neomycin Other (See Comments)    Unknown reaction   Neosporin [Neomycin-Polymyxin-Gramicidin] Swelling   Neurontin [Gabapentin] Other (See Comments)    Confusion   Codeine Palpitations    Family History  Problem Relation Age of Onset   Breast cancer Mother    Colon cancer Father    Diabetes Sister     Prior to Admission medications   Medication Sig Start Date End Date Taking? Authorizing Provider  acetaminophen (TYLENOL) 500 MG tablet Take 1,000 mg by mouth every 6 (six) hours as needed.   Yes [provider]  amLODipine (NORVASC) 2.5 MG tablet Take 2.5 mg by mouth daily. 05/28/22  Yes [provider]  aspirin EC 81 MG tablet Take 1 tablet (81 mg total) by  mouth daily. Swallow whole. 12/31/21  Yes Danford, Suann Larry, MD  atorvastatin (LIPITOR) 40 MG tablet Take 1 tablet (40 mg total) by mouth daily. 12/31/21  Yes Danford, Suann Larry, MD  brimonidine (ALPHAGAN) 0.2 % ophthalmic solution Place 1 drop into both eyes 3 (three) times daily. 12/23/21  Yes [provider]  dorzolamide-timolol (COSOPT) 22.3-6.8 MG/ML ophthalmic solution 1 drop 2 (two) times daily.     Yes [provider]  latanoprost (XALATAN) 0.005 % ophthalmic solution Place 1 drop into both eyes every evening. 12/23/21  Yes [provider]  OPTIVE 0.5-0.9 % ophthalmic solution Place 1 drop into both eyes 4 (four) times daily as needed for dry eyes. 12/23/21  Yes [provider]  RHOPRESSA 0.02 % SOLN Place 1 drop into both eyes every evening. 12/23/21  Yes [provider]  Travoprost, BAK Free, (TRAVATAMN) 0.004 % SOLN ophthalmic solution Place 1 drop into both eyes at bedtime.    Yes [provider]  diclofenac Sodium (VOLTAREN ARTHRITIS PAIN) 1 % GEL Apply 4 g topically 4 (four) times daily. Patient not taking: Reported on 06/05/2022 04/17/22   Dorie Rank, MD    Physical Exam: Vitals:   06/05/22 1600 06/05/22 1635 06/05/22 1755 06/05/22 1850  BP: 132/73 (!) 153/65  (!) 184/76  Pulse: 63 62  69  Resp: 19 (!) 24  20  Temp:   98.3 F (36.8 C) 98.5 F (36.9 C)  TempSrc:   Oral Oral  SpO2: 95% 96%  96%  Weight:    62.2 kg  Height:    '4\' 11"'$  (1.499 m)    Physical Exam Vitals reviewed.  Constitutional:      General: She is not in acute distress. HENT:     Head: Normocephalic and atraumatic.  Eyes:     Extraocular Movements: Extraocular movements intact.  Cardiovascular:     Rate and Rhythm: Normal rate and regular rhythm.     Pulses: Normal pulses.  Pulmonary:     Effort: Pulmonary effort is normal. No respiratory distress.     Breath sounds: No wheezing or rales.  Abdominal:     General: Bowel sounds are normal. There  is no distension.     Palpations: Abdomen is soft.     Tenderness: There is no abdominal tenderness.  Musculoskeletal:        General: No swelling or tenderness.     Cervical back: Normal range of motion.  Skin:    General: Skin is warm and dry.  Neurological:     General: No focal deficit present.     Mental Status: She is alert and oriented to person, place, and time.     Labs on Admission: I have personally reviewed following labs and imaging studies  CBC: Recent Labs  Lab 06/05/22 0309  WBC 11.6*  HGB 12.4  HCT 37.9  MCV 97.7  PLT 809   Basic Metabolic Panel: Recent Labs  Lab  06/05/22 0309  NA 138  K 3.9  CL 107  CO2 20*  GLUCOSE 116*  BUN 34*  CREATININE 1.65*  CALCIUM 8.9  MG 2.0   GFR: Estimated Creatinine Clearance: 18.9 mL/min (A) (by C-G formula based on SCr of 1.65 mg/dL (H)). Liver Function Tests: Recent Labs  Lab 06/05/22 0310  AST 17  ALT 11  ALKPHOS 60  BILITOT 0.5  PROT 7.0  ALBUMIN 3.9   No results for input(s): "LIPASE", "AMYLASE" in the last 168 hours. No results for input(s): "AMMONIA" in the last 168 hours. Coagulation Profile: No results for input(s): "INR", "PROTIME" in the last 168 hours. Cardiac Enzymes: No results for input(s): "CKTOTAL", "CKMB", "CKMBINDEX", "TROPONINI" in the last 168 hours. BNP (last 3 results) No results for input(s): "PROBNP" in the last 8760 hours. HbA1C: No results for input(s): "HGBA1C" in the last 72 hours. CBG: No results for input(s): "GLUCAP" in the last 168 hours. Lipid Profile: No results for input(s): "CHOL", "HDL", "LDLCALC", "TRIG", "CHOLHDL", "LDLDIRECT" in the last 72 hours. Thyroid Function Tests: No results for input(s): "TSH", "T4TOTAL", "FREET4", "T3FREE", "THYROIDAB" in the last 72 hours. Anemia Panel: No results for input(s): "VITAMINB12", "FOLATE", "FERRITIN", "TIBC", "IRON", "RETICCTPCT" in the last 72 hours. Urine analysis:    Component Value Date/Time   COLORURINE YELLOW  06/05/2022 0512   APPEARANCEUR CLEAR 06/05/2022 0512   LABSPEC 1.017 06/05/2022 0512   PHURINE 6.0 06/05/2022 0512   GLUCOSEU NEGATIVE 06/05/2022 0512   HGBUR NEGATIVE 06/05/2022 0512   BILIRUBINUR NEGATIVE 06/05/2022 0512   KETONESUR NEGATIVE 06/05/2022 0512   PROTEINUR NEGATIVE 06/05/2022 0512   UROBILINOGEN 0.2 04/24/2011 1235   NITRITE NEGATIVE 06/05/2022 0512   LEUKOCYTESUR MODERATE (A) 06/05/2022 0512    Radiological Exams on Admission: CT Angio Chest/Abd/Pel for Dissection W and/or Wo Contrast  Result Date: 06/05/2022 CLINICAL DATA:  88 year old female with pain, nausea, weakness, near syncope. EXAM: CT ANGIOGRAPHY CHEST, ABDOMEN AND PELVIS TECHNIQUE: Non-contrast CT of the chest was initially obtained. Multidetector CT imaging through the chest, abdomen and pelvis was performed using the standard protocol during bolus administration of intravenous contrast. Multiplanar reconstructed images and MIPs were obtained and reviewed to evaluate the vascular anatomy. RADIATION DOSE REDUCTION: This exam was performed according to the departmental dose-optimization program which includes automated exposure control, adjustment of the mA and/or kV according to patient size and/or use of iterative reconstruction technique. CONTRAST:  65m OMNIPAQUE IOHEXOL 350 MG/ML SOLN COMPARISON:  CT Abdomen and Pelvis 01/27/2011.  No prior chest CT. FINDINGS: CTA CHEST FINDINGS Cardiovascular: Mildly tortuous thoracic aorta with calcified atherosclerosis. Left coronary artery calcified plaque and/or stent on series 5, image 26. Cardiac size remains within normal limits. No pericardial effusion. Negative for thoracic aortic aneurysm or dissection. Central pulmonary arteries also well enhanced and appear to be patent. Mediastinum/Nodes: Moderate to large gastric hiatal hernia has progressed since 2012. Negative for mediastinal mass or lymphadenopathy. Lungs/Pleura: Major airways are patent. Mild lung base atelectasis.  No pleural effusion or convincing acute pulmonary inflammation. Musculoskeletal: Mild kyphoscoliosis. Osteopenia. Thoracic spine degeneration. Chronic appearing right anterior 2nd through 7th rib fractures, mostly nondisplaced. No acute osseous abnormality identified. Review of the MIP images confirms the above findings. CTA ABDOMEN AND PELVIS FINDINGS VASCULAR Extensive Aortoiliac calcified atherosclerosis. Negative for abdominal aortic aneurysm or dissection. Major arterial structures in the abdomen and pelvis remain patent. Proximal femoral arteries are patent with atherosclerosis. Review of the MIP images confirms the above findings. NON-VASCULAR Hepatobiliary: Negative liver and gallbladder. Pancreas: Negative.  Spleen: Negative. Adrenals/Urinary Tract: Negative adrenal glands. Symmetric bilateral renal cortical atrophy since 2012. Kidneys appear nonobstructed, symmetrically enhancing. Unremarkable bladder. Occasional pelvic phleboliths. Stomach/Bowel: Somewhat limited bowel detail with this technique. No dilated large or small bowel loops. 50% gastric hiatal hernia has progressed since 2012. Intra-abdominal stomach and duodenum appear negative. Abundant large bowel retained stool. No bowel inflammation is identified. Appendix is not clearly delineated. No free air or free fluid identified. Lymphatic: No lymphadenopathy identified. Reproductive: Within normal limits. Other: No pelvic free fluid. Musculoskeletal: Chronic spondylolisthesis in the lower lumbar spine. Chronic degeneration, including severe T12-L1 disc and endplate degeneration superimposed on multilevel lower thoracic chronic interbody ankylosis from flowing osteophytes. No acute osseous abnormality identified. Review of the MIP images confirms the above findings. IMPRESSION: 1. Negative for aortic aneurysm or dissection. Positive for Aortic Atherosclerosis (ICD10-I70.0). 2. Progressed chronic gastric hiatal hernia since 2012, with roughly half of  the stomach now intrathoracic. 3. No superimposed acute or inflammatory process identified in the chest, abdomen, or pelvis. 4. Osteopenia. Chronic appearing right anterior 2nd through 7th rib fractures. Advanced spine degeneration. Electronically Signed   By: Genevie Ann M.D.   On: 06/05/2022 07:34   DG Chest 2 View  Result Date: 06/05/2022 CLINICAL DATA:  Presyncope. EXAM: CHEST - 2 VIEW COMPARISON:  12/28/2021 FINDINGS: The lungs are clear without focal pneumonia, edema, pneumothorax or pleural effusion. Interstitial markings are diffusely coarsened with chronic features. The cardio pericardial silhouette is enlarged. Small to moderate hiatal hernia evident. Telemetry leads overlie the chest. IMPRESSION: 1. No acute cardiopulmonary findings. 2. Small to moderate hiatal hernia. Electronically Signed   By: Misty Stanley M.D.   On: 06/05/2022 05:29    Assessment and Plan  Elevated troponin Patient presenting with complaints of sudden onset nausea, pain in both of her upper arms, and around her mouth/chin.  EKG without acute ischemic changes.  High-sensitivity troponin 8 > 25. CTA of chest/abdomen/pelvis negative for dissection or large PE.  At present, she is not complaining of any chest pain but continues to complain of pain around her mouth/chin.  Heart score 6.  Presentation concerning for possible atypical ACS. -Cardiac monitoring -Stat repeat troponin.  If trending up, start IV heparin. -Echocardiogram -Discussed with Dr. Alfred Levins, cardiology will consult.  Recommending keeping n.p.o. after midnight.  Hiatal hernia CT showing worsening hiatal hernia with half of the stomach now intrathoracic. -Likely not a surgical candidate given age and comorbidities.  Needs to be discussed with cardiology and general surgery.  Elevated D-dimer In the setting of chronic kidney disease.  D-dimer 2.9.  CTA negative for large PE.  No clinical signs of DVT on exam.  Abnormal urinalysis UA with negative nitrite,  moderate amount of leukocytes, and microscopy showing 11-20 WBCs.  Patient is not endorsing any urinary symptoms. -Urine culture  Hypertension Most recent blood pressure 143/63. -Continue amlodipine  Hyperlipidemia -Continue Lipitor  History of CVA -Continue aspirin, Lipitor  CKD stage IIIb Creatinine stable. -Continue to monitor renal function  DVT prophylaxis: SQ Heparin Code Status: Full Code (discussed with the patient and her family) Family Communication: Patient's son, daughter, and son-in-law at bedside. Consults called: Cardiology Level of care: Telemetry bed Admission status: It is my clinical opinion that referral for OBSERVATION is reasonable and necessary in this patient based on the above information provided. The aforementioned taken together are felt to place the patient at high risk for further clinical deterioration. However, it is anticipated that the patient may be medically stable for discharge  from the hospital within 24 to 48 hours.   Shela Leff MD [facility]  If 7PM-7AM, please contact night-coverage [email or web address]  06/05/2022, 7:17 PM

## 2022-06-05 NOTE — ED Notes (Signed)
ED Provider at bedside. 

## 2022-06-05 NOTE — Plan of Care (Addendum)
Transfer from [facility] Ms. Collier is a 88 year old female with pmh HTN(not on medication), HLD, CVA, and CKD who presented with complaints of waking up feeling unwell with nausea.  Ported complaints of pain in neck and jaw.  Blood pressures were noted to be elevated up to 173/75 with some mild tachypnea and heart rates as low as 58.  Labs noted WBC elevated 11.6, BNP 95.7,  high-sensitivity troponin 8-> 25, and D-dimer 2.9.  EKG noted to be similar to prior without significant ischemic changes.  CTA of the chest abdomen pelvis negative for dissection or large pulmonary embolus, but did note worsening hiatal hernia..  Transfer requested due to concern for atypical ACS.  Patient having given GI cocktail, 324 mg of aspirin, 1 L lactated Ringer's, and oxycodone.  Orders placed for observation to a telemetry bed.  Will need to check echocardiogram and consult cardiology for possible need of further invasive testing.

## 2022-06-05 NOTE — ED Provider Notes (Addendum)
  Physical Exam  BP (!) 173/75   Pulse 68   Temp 97.7 F (36.5 C) (Oral)   Resp (!) 24   Ht '4\' 11"'$  (1.499 m)   Wt 59 kg   SpO2 97%   BMI 26.26 kg/m   Physical Exam  Procedures  .Critical Care  Performed by: Varney Biles, MD Authorized by: Varney Biles, MD   Critical care provider statement:    Critical care time (minutes):  32   Critical care was necessary to treat or prevent imminent or life-threatening deterioration of the following conditions:  Circulatory failure and cardiac failure   Critical care was time spent personally by me on the following activities:  Development of treatment plan with patient or surrogate, discussions with consultants, evaluation of patient's response to treatment, examination of patient, ordering and review of laboratory studies, ordering and review of radiographic studies, ordering and performing treatments and interventions, pulse oximetry, re-evaluation of patient's condition and review of old charts   ED Course / MDM    Medical Decision Making Amount and/or Complexity of Data Reviewed Labs: ordered. Radiology: ordered.  Risk OTC drugs. Prescription drug management. Decision regarding hospitalization.   Sudden onset nausea, neck pain, arm pain, mouth pain. BP was high. Hs-tn - 8-->25.  CT dissection ordered. If neg - admit for ACS r/o.    Varney Biles, MD 06/05/22 0714  8:28 AM CT scan shows a worsening ventral hernia. No dissection. Patient reassessed.  She said that she woke up with sudden onset nausea and feeling sick and unwell.  She denies any epigastric pain.  She has never been told that she has hernia.  She has not had any postprandial symptoms including yesterday.  She went to bed feeling fine.  She is having intermittent discomfort over her mouth/jaw.  Discussed with the patient that the dissection study is negative.  Patient has rising high-sensitivity troponin.   She has history of stroke, uncontrolled  hypertension, CKD and is over 87.  This could be atypical presentation of ACS.  Will need admission for ACS rule out. Other possibility includes worsening ventral hernia.  GI cocktail ordered. I have reviewed MRA of head and neck from June, 2023.  There were no vascular pathology noted, further reducing the chance of aneurysm or dissection.  Patient's BP is slightly high.  We will give her low-dose metoprolol. ?  Poorly controlled hypertension. stable for admission.   Varney Biles, MD 06/05/22 [place], [place], MD 06/05/22 [phone]

## 2022-06-05 NOTE — Progress Notes (Signed)
ANTICOAGULATION CONSULT NOTE - Initial Consult  Pharmacy Consult for heparin Indication: chest pain/ACS  Allergies  Allergen Reactions   Actonel [Risedronate] Nausea And Vomiting   Fosamax [Alendronate] Nausea And Vomiting   Miacalcin Other (See Comments)    Unknown reaction   Neomycin Other (See Comments)    Unknown reaction   Neosporin [Neomycin-Polymyxin-Gramicidin] Swelling   Neurontin [Gabapentin] Other (See Comments)    Confusion   Codeine Palpitations    Patient Measurements: Height: '4\' 11"'$  (149.9 cm) Weight: 62.2 kg (137 lb 2 oz) IBW/kg (Calculated) : 43.2 HEPARIN DW (KG): 56.5   Vital Signs: Temp: 98 F (36.7 C) (11/10 1928) Temp Source: Oral (11/10 1928) BP: 177/82 (11/10 2140) Pulse Rate: 77 (11/10 2140)  Labs: Recent Labs    06/05/22 0309 06/05/22 0526 06/05/22 2019  HGB 12.4  --   --   HCT 37.9  --   --   PLT 256  --   --   CREATININE 1.65*  --   --   TROPONINIHS 8 25* 796*    Estimated Creatinine Clearance: 18.9 mL/min (A) (by C-G formula based on SCr of 1.65 mg/dL (H)).   Medical History: Past Medical History:  Diagnosis Date   CKD (chronic kidney disease), stage III (HCC)    Family history of breast cancer    mother   Family history of colon cancer    father   Glaucoma    Osteopenia    SBO (small bowel obstruction) (HCC)     Medications:  Medications Prior to Admission  Medication Sig Dispense Refill Last Dose   acetaminophen (TYLENOL) 500 MG tablet Take 1,000 mg by mouth every 6 (six) hours as needed.   Unk   amLODipine (NORVASC) 2.5 MG tablet Take 2.5 mg by mouth daily.   06/04/2022   aspirin EC 81 MG tablet Take 1 tablet (81 mg total) by mouth daily. Swallow whole. 30 tablet 12 06/04/2022   atorvastatin (LIPITOR) 40 MG tablet Take 1 tablet (40 mg total) by mouth daily. 30 tablet 11 06/04/2022   brimonidine (ALPHAGAN) 0.2 % ophthalmic solution Place 1 drop into both eyes 3 (three) times daily.   Unk   dorzolamide-timolol (COSOPT)  22.3-6.8 MG/ML ophthalmic solution 1 drop 2 (two) times daily.     06/05/2022   latanoprost (XALATAN) 0.005 % ophthalmic solution Place 1 drop into both eyes every evening.   06/04/2022   OPTIVE 0.5-0.9 % ophthalmic solution Place 1 drop into both eyes 4 (four) times daily as needed for dry eyes.   06/04/2022   RHOPRESSA 0.02 % SOLN Place 1 drop into both eyes every evening.   06/04/2022   Travoprost, BAK Free, (TRAVATAMN) 0.004 % SOLN ophthalmic solution Place 1 drop into both eyes at bedtime.    06/04/2022   diclofenac Sodium (VOLTAREN ARTHRITIS PAIN) 1 % GEL Apply 4 g topically 4 (four) times daily. (Patient not taking: Reported on 06/05/2022) 100 g 0 Not Taking   Scheduled:   amLODipine  2.5 mg Oral Daily   [START ON 06/06/2022] aspirin EC  81 mg Oral Daily   atorvastatin  40 mg Oral Daily   dorzolamide-timolol  1 drop Both Eyes BID   heparin  5,000 Units Subcutaneous Q8H   latanoprost  1 drop Both Eyes QHS   Netarsudil Dimesylate  1 drop Both Eyes QPM    Assessment: 88 yo female with elevated troponin to start heparin for r/o ACS. No anticoagulants noted PTA -hg= 12.4, plt= 256   Goal of  Therapy:  Heparin level 0.3-0.7 units/ml Monitor platelets by anticoagulation protocol: Yes   Plan:  -heparin bolus 3500 units x1 followed by 700 units/he -Heparin level in 8 hours and daily wth CBC daily  Hildred Laser, PharmD Clinical Pharmacist **Pharmacist phone directory can now be found on [email or web address] ([email or web address]).  Listed under [place].

## 2022-06-05 NOTE — ED Notes (Signed)
Pt c/o mouth and upper arm pain. MD notified.

## 2022-06-05 NOTE — Progress Notes (Signed)
Pt has just arrived to unit. Pt is alert and oriented. Pt stated feeling her face hot and is requesting to see the Dr.  Abbott Pao was placed in telemetry.vitals completed. Call bell within reach.Marland Kitchen

## 2022-06-05 NOTE — ED Notes (Signed)
Gave report to [facility] and nurse on 3E.

## 2022-06-06 ENCOUNTER — Other Ambulatory Visit (HOSPITAL_COMMUNITY): Payer: Medicare Other

## 2022-06-06 ENCOUNTER — Observation Stay (HOSPITAL_BASED_OUTPATIENT_CLINIC_OR_DEPARTMENT_OTHER): Payer: Medicare Other

## 2022-06-06 DIAGNOSIS — Z800 Family history of malignant neoplasm of digestive organs: Secondary | ICD-10-CM | POA: Diagnosis not present

## 2022-06-06 DIAGNOSIS — E785 Hyperlipidemia, unspecified: Secondary | ICD-10-CM

## 2022-06-06 DIAGNOSIS — I214 Non-ST elevation (NSTEMI) myocardial infarction: Secondary | ICD-10-CM | POA: Diagnosis present

## 2022-06-06 DIAGNOSIS — Z803 Family history of malignant neoplasm of breast: Secondary | ICD-10-CM | POA: Diagnosis not present

## 2022-06-06 DIAGNOSIS — Z7982 Long term (current) use of aspirin: Secondary | ICD-10-CM | POA: Diagnosis not present

## 2022-06-06 DIAGNOSIS — I10 Essential (primary) hypertension: Secondary | ICD-10-CM

## 2022-06-06 DIAGNOSIS — M542 Cervicalgia: Secondary | ICD-10-CM | POA: Diagnosis present

## 2022-06-06 DIAGNOSIS — Z9049 Acquired absence of other specified parts of digestive tract: Secondary | ICD-10-CM | POA: Diagnosis not present

## 2022-06-06 DIAGNOSIS — Z79899 Other long term (current) drug therapy: Secondary | ICD-10-CM | POA: Diagnosis not present

## 2022-06-06 DIAGNOSIS — Z87891 Personal history of nicotine dependence: Secondary | ICD-10-CM | POA: Diagnosis not present

## 2022-06-06 DIAGNOSIS — N1832 Chronic kidney disease, stage 3b: Secondary | ICD-10-CM | POA: Diagnosis present

## 2022-06-06 DIAGNOSIS — Z8673 Personal history of transient ischemic attack (TIA), and cerebral infarction without residual deficits: Secondary | ICD-10-CM | POA: Diagnosis not present

## 2022-06-06 DIAGNOSIS — R7989 Other specified abnormal findings of blood chemistry: Secondary | ICD-10-CM | POA: Diagnosis not present

## 2022-06-06 DIAGNOSIS — Z833 Family history of diabetes mellitus: Secondary | ICD-10-CM | POA: Diagnosis not present

## 2022-06-06 DIAGNOSIS — I2510 Atherosclerotic heart disease of native coronary artery without angina pectoris: Secondary | ICD-10-CM | POA: Diagnosis not present

## 2022-06-06 DIAGNOSIS — R8290 Unspecified abnormal findings in urine: Secondary | ICD-10-CM | POA: Diagnosis not present

## 2022-06-06 DIAGNOSIS — I5030 Unspecified diastolic (congestive) heart failure: Secondary | ICD-10-CM

## 2022-06-06 DIAGNOSIS — K449 Diaphragmatic hernia without obstruction or gangrene: Secondary | ICD-10-CM | POA: Diagnosis present

## 2022-06-06 DIAGNOSIS — I129 Hypertensive chronic kidney disease with stage 1 through stage 4 chronic kidney disease, or unspecified chronic kidney disease: Secondary | ICD-10-CM | POA: Diagnosis present

## 2022-06-06 LAB — ECHOCARDIOGRAM COMPLETE
AR max vel: 2.16 cm2
AV Area VTI: 2.01 cm2
AV Area mean vel: 2 cm2
AV Mean grad: 2 mmHg
AV Peak grad: 3.9 mmHg
Ao pk vel: 0.98 m/s
Area-P 1/2: 3.54 cm2
Height: 59 in
S' Lateral: 2.7 cm
Weight: 2113.6 oz

## 2022-06-06 LAB — BASIC METABOLIC PANEL
Anion gap: 9 (ref 5–15)
BUN: 26 mg/dL — ABNORMAL HIGH (ref 8–23)
CO2: 23 mmol/L (ref 22–32)
Calcium: 8.8 mg/dL — ABNORMAL LOW (ref 8.9–10.3)
Chloride: 107 mmol/L (ref 98–111)
Creatinine, Ser: 1.74 mg/dL — ABNORMAL HIGH (ref 0.44–1.00)
GFR, Estimated: 28 mL/min — ABNORMAL LOW (ref >60)
Glucose, Bld: 115 mg/dL — ABNORMAL HIGH (ref 70–99)
Potassium: 4.1 mmol/L (ref 3.5–5.1)
Sodium: 139 mmol/L (ref 135–145)

## 2022-06-06 LAB — CBC
HCT: 36.3 % (ref 36.0–46.0)
Hemoglobin: 11.9 g/dL — ABNORMAL LOW (ref 12.0–15.0)
MCH: 31.8 pg (ref 26.0–34.0)
MCHC: 32.8 g/dL (ref 30.0–36.0)
MCV: 97.1 fL (ref 80.0–100.0)
Platelets: 235 10*3/uL (ref 150–400)
RBC: 3.74 MIL/uL — ABNORMAL LOW (ref 3.87–5.11)
RDW: 13.1 % (ref 11.5–15.5)
WBC: 9.8 10*3/uL (ref 4.0–10.5)
nRBC: 0 % (ref 0.0–0.2)

## 2022-06-06 LAB — TROPONIN I (HIGH SENSITIVITY)
Troponin I (High Sensitivity): 763 ng/L (ref <18)
Troponin I (High Sensitivity): 633 ng/L (ref <18)
Troponin I (High Sensitivity): 877 ng/L (ref <18)
Troponin I (High Sensitivity): 749 ng/L (ref <18)

## 2022-06-06 LAB — HEPARIN LEVEL (UNFRACTIONATED)
Heparin Unfractionated: 0.64 IU/mL (ref 0.30–0.70)
Heparin Unfractionated: 0.49 IU/mL (ref 0.30–0.70)

## 2022-06-06 NOTE — Progress Notes (Addendum)
ANTICOAGULATION CONSULT NOTE   Pharmacy Consult for heparin Indication: chest pain/ACS  Allergies  Allergen Reactions   Actonel [Risedronate] Nausea And Vomiting   Fosamax [Alendronate] Nausea And Vomiting   Miacalcin Other (See Comments)    Unknown reaction   Neomycin Other (See Comments)    Unknown reaction   Neosporin [Neomycin-Polymyxin-Gramicidin] Swelling   Neurontin [Gabapentin] Other (See Comments)    Confusion   Codeine Palpitations    Patient Measurements: Height: '4\' 11"'$  (149.9 cm) Weight: 59.9 kg (132 lb 1.6 oz) IBW/kg (Calculated) : 43.2 HEPARIN DW (KG): 56.5   Vital Signs: Temp: 98.2 F (36.8 C) (11/11 0523) Temp Source: Oral (11/11 0523) BP: 156/68 (11/11 0523) Pulse Rate: 67 (11/11 0523)  Labs: Recent Labs    06/05/22 0309 06/05/22 0526 06/05/22 2019 06/06/22 0024 06/06/22 0403 06/06/22 0644  HGB 12.4  --   --  11.9*  --   --   HCT 37.9  --   --  36.3  --   --   PLT 256  --   --  235  --   --   HEPARINUNFRC  --   --   --   --   --  0.64  CREATININE 1.65*  --   --  1.74*  --   --   TROPONINIHS 8   < > 796* 763* 633*  --    < > = values in this interval not displayed.     Estimated Creatinine Clearance: 17.6 mL/min (A) (by C-G formula based on SCr of 1.74 mg/dL (H)).   Medical History: Past Medical History:  Diagnosis Date   CKD (chronic kidney disease), stage III (HCC)    Family history of breast cancer    mother   Family history of colon cancer    father   Glaucoma    Osteopenia    SBO (small bowel obstruction) (HCC)     Medications:  Medications Prior to Admission  Medication Sig Dispense Refill Last Dose   acetaminophen (TYLENOL) 500 MG tablet Take 1,000 mg by mouth every 6 (six) hours as needed.   Unk   amLODipine (NORVASC) 2.5 MG tablet Take 2.5 mg by mouth daily.   06/04/2022   aspirin EC 81 MG tablet Take 1 tablet (81 mg total) by mouth daily. Swallow whole. 30 tablet 12 06/04/2022   atorvastatin (LIPITOR) 40 MG tablet Take  1 tablet (40 mg total) by mouth daily. 30 tablet 11 06/04/2022   brimonidine (ALPHAGAN) 0.2 % ophthalmic solution Place 1 drop into both eyes 3 (three) times daily.   Unk   dorzolamide-timolol (COSOPT) 22.3-6.8 MG/ML ophthalmic solution 1 drop 2 (two) times daily.     06/05/2022   latanoprost (XALATAN) 0.005 % ophthalmic solution Place 1 drop into both eyes every evening.   06/04/2022   OPTIVE 0.5-0.9 % ophthalmic solution Place 1 drop into both eyes 4 (four) times daily as needed for dry eyes.   06/04/2022   RHOPRESSA 0.02 % SOLN Place 1 drop into both eyes every evening.   06/04/2022   Travoprost, BAK Free, (TRAVATAMN) 0.004 % SOLN ophthalmic solution Place 1 drop into both eyes at bedtime.    06/04/2022   diclofenac Sodium (VOLTAREN ARTHRITIS PAIN) 1 % GEL Apply 4 g topically 4 (four) times daily. (Patient not taking: Reported on 06/05/2022) 100 g 0 Not Taking   Scheduled:   amLODipine  2.5 mg Oral Daily   aspirin EC  81 mg Oral Daily   atorvastatin  40  mg Oral Daily   dorzolamide-timolol  1 drop Both Eyes BID   latanoprost  1 drop Both Eyes QHS   Netarsudil Dimesylate  1 drop Both Eyes QPM    Assessment: 88 yo female with elevated troponin to start heparin for r/o ACS, treating as NSTEMI. No anticoagulants noted PTA.   8 hour heparin level therapeutic at 0.64. No issues with infusion or bleeding noted per RN. Hgb 11.9 and PLT 235 stable.   Goal of Therapy:  Heparin level 0.3-0.7 units/ml Monitor platelets by anticoagulation protocol: Yes   Plan:  Continue heparin at 700 units/hour Check confirmatory 8 hour heparin level Daily heparin level and CBC  Monitor for s/sx bleeding  F/u plans for potential LHC   Eliseo Gum, PharmD PGY1 Pharmacy Resident   Addendum  Confirm HL therapeutic. Cont current rate.  Onnie Boer, PharmD, BCIDP, AAHIVP, CPP Infectious Disease Pharmacist 06/06/2022 3:19 PM

## 2022-06-06 NOTE — Progress Notes (Signed)
Progress Note  Patient Name: Laurie [name] Date of Encounter: 06/06/2022  Primary Cardiologist: None   Subjective   Pt reports sudden onset weakness with nausea, intermittent bilateral arm pain radiating to jaw starting yesterday, relieved with nitroglycerin.  Today, she reports that she is feeling fine other than that she is hungry. She still has some arm pain though it is different from yesterday's. She says her current pain is from "people messing with her."  Inpatient Medications    Scheduled Meds:  amLODipine  2.5 mg Oral Daily   aspirin EC  81 mg Oral Daily   atorvastatin  40 mg Oral Daily   dorzolamide-timolol  1 drop Both Eyes BID   latanoprost  1 drop Both Eyes QHS   Netarsudil Dimesylate  1 drop Both Eyes QPM   Continuous Infusions:  heparin 700 Units/hr (06/06/22 0003)   PRN Meds: acetaminophen, carboxymethylcellul-glycerin, nitroGLYCERIN   Vital Signs    Vitals:   06/05/22 2200 06/05/22 2355 06/06/22 0523 06/06/22 0751  BP: 126/65 (!) 119/51 (!) 156/68 (!) 152/70  Pulse: 81 78 67 65  Resp: (!) '23 16 19 '$ (!) 22  Temp:  98.2 F (36.8 C) 98.2 F (36.8 C) 98.4 F (36.9 C)  TempSrc:  Oral Oral Oral  SpO2: 91% 96% 95% 96%  Weight:   59.9 kg   Height:        Intake/Output Summary (Last 24 hours) at 06/06/2022 0905 Last data filed at 06/06/2022 0600 Gross per 24 hour  Intake 76.13 ml  Output 600 ml  Net -523.87 ml   Filed Weights   06/05/22 0306 06/05/22 1850 06/06/22 0523  Weight: 59 kg 62.2 kg 59.9 kg    Telemetry    Sinus rhythm - Personally Reviewed  ECG    Normal sinus rhythm, old septal MI - Personally Reviewed  Physical Exam  GEN: No acute distress.   Neck: No JVD Cardiac: RRR, no murmurs, rubs, or gallops.  Respiratory: Clear to auscultation bilaterally. GI: Soft, nontender, non-distended  MS: No edema; No deformity. Neuro:  Nonfocal  Psych: Normal affect   Labs    Chemistry Recent Labs  Lab 06/05/22 0309  06/05/22 0310 06/06/22 0024  NA 138  --  139  K 3.9  --  4.1  CL 107  --  107  CO2 20*  --  23  GLUCOSE 116*  --  115*  BUN 34*  --  26*  CREATININE 1.65*  --  1.74*  CALCIUM 8.9  --  8.8*  PROT  --  7.0  --   ALBUMIN  --  3.9  --   AST  --  17  --   ALT  --  11  --   ALKPHOS  --  60  --   BILITOT  --  0.5  --   GFRNONAA 30*  --  28*  ANIONGAP 11  --  9     Hematology Recent Labs  Lab 06/05/22 0309 06/06/22 0024  WBC 11.6* 9.8  RBC 3.88 3.74*  HGB 12.4 11.9*  HCT 37.9 36.3  MCV 97.7 97.1  MCH 32.0 31.8  MCHC 32.7 32.8  RDW 13.2 13.1  PLT 256 235    Cardiac EnzymesNo results for input(s): "TROPONINI" in the last 168 hours. No results for input(s): "TROPIPOC" in the last 168 hours.   BNP Recent Labs  Lab 06/05/22 0309  BNP 95.7     DDimer  Recent Labs  Lab 06/05/22 0602  DDIMER  2.90*     Radiology    CT Angio Chest/Abd/Pel for Dissection W and/or Wo Contrast  Result Date: 06/05/2022 CLINICAL DATA:  88 year old female with pain, nausea, weakness, near syncope. EXAM: CT ANGIOGRAPHY CHEST, ABDOMEN AND PELVIS TECHNIQUE: Non-contrast CT of the chest was initially obtained. Multidetector CT imaging through the chest, abdomen and pelvis was performed using the standard protocol during bolus administration of intravenous contrast. Multiplanar reconstructed images and MIPs were obtained and reviewed to evaluate the vascular anatomy. RADIATION DOSE REDUCTION: This exam was performed according to the departmental dose-optimization program which includes automated exposure control, adjustment of the mA and/or kV according to patient size and/or use of iterative reconstruction technique. CONTRAST:  57m OMNIPAQUE IOHEXOL 350 MG/ML SOLN COMPARISON:  CT Abdomen and Pelvis 01/27/2011.  No prior chest CT. FINDINGS: CTA CHEST FINDINGS Cardiovascular: Mildly tortuous thoracic aorta with calcified atherosclerosis. Left coronary artery calcified plaque and/or stent on series 5,  image 26. Cardiac size remains within normal limits. No pericardial effusion. Negative for thoracic aortic aneurysm or dissection. Central pulmonary arteries also well enhanced and appear to be patent. Mediastinum/Nodes: Moderate to large gastric hiatal hernia has progressed since 2012. Negative for mediastinal mass or lymphadenopathy. Lungs/Pleura: Major airways are patent. Mild lung base atelectasis. No pleural effusion or convincing acute pulmonary inflammation. Musculoskeletal: Mild kyphoscoliosis. Osteopenia. Thoracic spine degeneration. Chronic appearing right anterior 2nd through 7th rib fractures, mostly nondisplaced. No acute osseous abnormality identified. Review of the MIP images confirms the above findings. CTA ABDOMEN AND PELVIS FINDINGS VASCULAR Extensive Aortoiliac calcified atherosclerosis. Negative for abdominal aortic aneurysm or dissection. Major arterial structures in the abdomen and pelvis remain patent. Proximal femoral arteries are patent with atherosclerosis. Review of the MIP images confirms the above findings. NON-VASCULAR Hepatobiliary: Negative liver and gallbladder. Pancreas: Negative. Spleen: Negative. Adrenals/Urinary Tract: Negative adrenal glands. Symmetric bilateral renal cortical atrophy since 2012. Kidneys appear nonobstructed, symmetrically enhancing. Unremarkable bladder. Occasional pelvic phleboliths. Stomach/Bowel: Somewhat limited bowel detail with this technique. No dilated large or small bowel loops. 50% gastric hiatal hernia has progressed since 2012. Intra-abdominal stomach and duodenum appear negative. Abundant large bowel retained stool. No bowel inflammation is identified. Appendix is not clearly delineated. No free air or free fluid identified. Lymphatic: No lymphadenopathy identified. Reproductive: Within normal limits. Other: No pelvic free fluid. Musculoskeletal: Chronic spondylolisthesis in the lower lumbar spine. Chronic degeneration, including severe T12-L1 disc  and endplate degeneration superimposed on multilevel lower thoracic chronic interbody ankylosis from flowing osteophytes. No acute osseous abnormality identified. Review of the MIP images confirms the above findings. IMPRESSION: 1. Negative for aortic aneurysm or dissection. Positive for Aortic Atherosclerosis (ICD10-I70.0). 2. Progressed chronic gastric hiatal hernia since 2012, with roughly half of the stomach now intrathoracic. 3. No superimposed acute or inflammatory process identified in the chest, abdomen, or pelvis. 4. Osteopenia. Chronic appearing right anterior 2nd through 7th rib fractures. Advanced spine degeneration. Electronically Signed   By: HGenevie AnnM.D.   On: 06/05/2022 07:34   DG Chest 2 View  Result Date: 06/05/2022 CLINICAL DATA:  Presyncope. EXAM: CHEST - 2 VIEW COMPARISON:  12/28/2021 FINDINGS: The lungs are clear without focal pneumonia, edema, pneumothorax or pleural effusion. Interstitial markings are diffusely coarsened with chronic features. The cardio pericardial silhouette is enlarged. Small to moderate hiatal hernia evident. Telemetry leads overlie the chest. IMPRESSION: 1. No acute cardiopulmonary findings. 2. Small to moderate hiatal hernia. Electronically Signed   By: EMisty StanleyM.D.   On: 06/05/2022 05:29    Cardiac Studies  TTE pending  Patient Profile     88 y.o. female with a hx of HTN (not on medication), HLD, CVA, CKD stage IIIb, history of SBO status post left hemicolectomy, former smoker who was admitted 06/05/2022 for the evaluation of "atypical ACS." She presented with sudden onset nausea, weakness, and intermittent arm and jaw pain.  hsTnI peaked at 796. ECG showed possible old septal MI but no evidence of acute ischemia.  Assessment & Plan    NSTEMI: troponin peaked at 796       - continue heparin drip, ASA 81 Elevated LDL: on home atorvastatin. HTN: BP controlled today on atorvastatin Hiatal hernia: significant herniation on CT. Could be  alternate cause of nausea Abnormal UA: urine culture   For questions or updates, please contact [place] Please consult [email or web address] for contact info under Cardiology/STEMI.      Signed, Melida Quitter, MD 06/06/2022, 9:05 AM

## 2022-06-06 NOTE — Progress Notes (Signed)
Family members/son at bedside, wanted to get an update about plan of care.Paged MD. Also wanted to know if two family members can stay overnight at bedside, verified with [facility], per policy only one person, family members informed.

## 2022-06-06 NOTE — Progress Notes (Signed)
[facility]  PROGRESS NOTE  Laurie [name] [id number]:[id number] DOB: [birth year] DOA: 06/05/2022 PCP: Lawerance Cruel, MD   Brief HPI:   88 year old female with medical history of hypertension, hyperlipidemia, CVA, CKD stage IIIb, history of SBO s/p left hemicolectomy, former smoker came to ED with complaints of sudden onset of nausea, pain in the neck and jaw. CTA chest was done which was negative for PE but showed worsening hiatal hernia with half of stomach now intrathoracic. Cardiology was consulted. Initial troponin was 25, repeat troponin 796, 763, 633 Patient started on IV heparin and aspirin.    Subjective   Patient seen and examined, eating breakfast.  Denies nausea, vomiting.  No chest pain   Assessment/Plan:     NSTEMI -Patient troponin went from 25 to 796 -Patient started on heparin and aspirin -Cardiology following  Hypertension -Blood pressure is stable -Continue amlodipine  Hiatal hernia -CT chest showed hiatal hernia worsening with half of stomach no intrathoracic -Patient is totally symptomatic, does not want surgery. - will likely benefit from surgical consultation as outpatient  Hyperlipidemia -Continue atorvastatin  D-dimer elevation -D-dimer is elevated at 2.9 -CTA negative for large PE -No signs of DVT on exam  Abnormal UA -Patient found to have abnormal UA, urine culture ordered -Follow urine culture results  History of CVA -Continue aspirin, Lipitor  CKD stage IIIb -Creatinine stable -At baseline    Medications     amLODipine  2.5 mg Oral Daily   aspirin EC  81 mg Oral Daily   atorvastatin  40 mg Oral Daily   dorzolamide-timolol  1 drop Both Eyes BID   latanoprost  1 drop Both Eyes QHS   Netarsudil Dimesylate  1 drop Both Eyes QPM     Data Reviewed:   CBG:  No results for input(s): "GLUCAP" in the last 168 hours.  SpO2: 96 %    Vitals:   06/05/22 2355 06/06/22 0523 06/06/22 0751 06/06/22 1000  BP: (!) 119/51  (!) 156/68 (!) 152/70 (!) 156/64  Pulse: 78 67 65   Resp: 16 19 (!) 22   Temp: 98.2 F (36.8 C) 98.2 F (36.8 C) 98.4 F (36.9 C)   TempSrc: Oral Oral Oral   SpO2: 96% 95% 96%   Weight:  59.9 kg    Height:          Data Reviewed:  Basic Metabolic Panel: Recent Labs  Lab 06/05/22 0309 06/06/22 0024  NA 138 139  K 3.9 4.1  CL 107 107  CO2 20* 23  GLUCOSE 116* 115*  BUN 34* 26*  CREATININE 1.65* 1.74*  CALCIUM 8.9 8.8*  MG 2.0  --     CBC: Recent Labs  Lab 06/05/22 0309 06/06/22 0024  WBC 11.6* 9.8  HGB 12.4 11.9*  HCT 37.9 36.3  MCV 97.7 97.1  PLT 256 235    LFT Recent Labs  Lab 06/05/22 0310  AST 17  ALT 11  ALKPHOS 60  BILITOT 0.5  PROT 7.0  ALBUMIN 3.9     Antibiotics: Anti-infectives (From admission, onward)    None        DVT prophylaxis: Heparin  Code Status: Full code  Family Communication: Discussed with patient's sons at bedside   CONSULTS cardiology   Objective    Physical Examination:   General-appears in no acute distress Heart-S1-S2, regular, no murmur auscultated Lungs-clear to auscultation bilaterally, no wheezing or crackles auscultated Abdomen-soft, nontender, no organomegaly Extremities-no edema in the lower extremities Neuro-alert, oriented x3, no focal  deficit noted   Status is: Inpatient:             Oswald Hillock   [facility] If 7PM-7AM, please contact night-coverage at [email or web address], Office  [phone]   06/06/2022, 10:28 AM  LOS: 0 days

## 2022-06-06 NOTE — Progress Notes (Signed)
CRITICAL VALUE STICKER  CRITICAL VALUE:Troponin= 796  RECEIVER (on-site recipient of call):Lakara Weiland RN  DATE & TIME NOTIFIED: 06/05/2022 2128  MESSENGER (representative from lab):Wiliam Ke  MD NOTIFIED: Dr. Bridgett Larsson and Dr. Alfred Levins  TIME OF NOTIFICATION: 2200  RESPONSE: New orders placed in [facility] and carried out.

## 2022-06-06 NOTE — Progress Notes (Signed)
Mobility Specialist Progress Note:   06/06/22 1553  Mobility  Activity Ambulated with assistance in hallway  Level of Assistance Contact guard assist, steadying assist  Assistive Device Front wheel walker  Distance Ambulated (ft) 500 ft  Activity Response Tolerated well  Mobility Referral Yes  $Mobility charge 1 Mobility   Pt received sitting EOB and eager. Mild unsteadiness upon standing from EOB, self corrected. No complaints throughout ambulation. Pt left on [facility] with all needs met, call bell in reach, family in room, and RN aware.   Andrey Campanile Mobility Specialist Please contact via SecureChat or  Rehab office at [phone]

## 2022-06-06 NOTE — Progress Notes (Signed)
  Echocardiogram 2D Echocardiogram has been performed.  Laurie [name] 06/06/2022, 2:04 PM

## 2022-06-07 DIAGNOSIS — R8290 Unspecified abnormal findings in urine: Secondary | ICD-10-CM | POA: Diagnosis not present

## 2022-06-07 DIAGNOSIS — I10 Essential (primary) hypertension: Secondary | ICD-10-CM | POA: Diagnosis not present

## 2022-06-07 DIAGNOSIS — R7989 Other specified abnormal findings of blood chemistry: Secondary | ICD-10-CM | POA: Diagnosis not present

## 2022-06-07 LAB — BASIC METABOLIC PANEL
Anion gap: 5 (ref 5–15)
BUN: 23 mg/dL (ref 8–23)
CO2: 25 mmol/L (ref 22–32)
Calcium: 8.6 mg/dL — ABNORMAL LOW (ref 8.9–10.3)
Chloride: 108 mmol/L (ref 98–111)
Creatinine, Ser: 1.68 mg/dL — ABNORMAL HIGH (ref 0.44–1.00)
GFR, Estimated: 29 mL/min — ABNORMAL LOW (ref >60)
Glucose, Bld: 112 mg/dL — ABNORMAL HIGH (ref 70–99)
Potassium: 4.3 mmol/L (ref 3.5–5.1)
Sodium: 138 mmol/L (ref 135–145)

## 2022-06-07 LAB — CBC
HCT: 33.3 % — ABNORMAL LOW (ref 36.0–46.0)
Hemoglobin: 11.3 g/dL — ABNORMAL LOW (ref 12.0–15.0)
MCH: 32.5 pg (ref 26.0–34.0)
MCHC: 33.9 g/dL (ref 30.0–36.0)
MCV: 95.7 fL (ref 80.0–100.0)
Platelets: 205 10*3/uL (ref 150–400)
RBC: 3.48 MIL/uL — ABNORMAL LOW (ref 3.87–5.11)
RDW: 13.2 % (ref 11.5–15.5)
WBC: 8 10*3/uL (ref 4.0–10.5)
nRBC: 0 % (ref 0.0–0.2)

## 2022-06-07 LAB — HEPARIN LEVEL (UNFRACTIONATED): Heparin Unfractionated: 0.5 IU/mL (ref 0.30–0.70)

## 2022-06-07 MED ORDER — NITROGLYCERIN 0.4 MG SL SUBL
0.4000 mg | SUBLINGUAL_TABLET | SUBLINGUAL | Status: DC | PRN
  Administered 2022-06-07 – 2022-06-08 (×3): 0.4 mg via SUBLINGUAL
  Filled 2022-06-07 (×2): qty 1

## 2022-06-07 MED ORDER — SODIUM CHLORIDE 0.9 % IV SOLN: INTRAVENOUS | Status: AC

## 2022-06-07 NOTE — Progress Notes (Addendum)
[facility]  PROGRESS NOTE  Laurie [name] UJW:[id number] DOB: 1932-06-02 DOA: 06/05/2022 PCP: Lawerance Cruel, MD   Brief HPI:   88 year old female with medical history of hypertension, hyperlipidemia, CVA, CKD stage IIIb, history of SBO s/p left hemicolectomy, former smoker came to ED with complaints of sudden onset of nausea, pain in the neck and jaw. CTA chest was done which was negative for PE but showed worsening hiatal hernia with half of stomach now intrathoracic. Cardiology was consulted. Initial troponin was 25, repeat troponin 796, 763, 633 Patient started on IV heparin and aspirin.    Subjective   Patient seen and examined, denies chest pain or shortness of breath.  Plan for cardiac cath in a.m.   Assessment/Plan:     NSTEMI -Patient troponin went from 25 to 796 -Patient started on heparin and aspirin -Cardiology following -Plan for cardiac cath in a.m.  Hypertension -Blood pressure is stable -Continue amlodipine  Hiatal hernia -CT chest showed hiatal hernia worsening with half of stomach no intrathoracic -Patient is totally symptomatic, does not want surgery. - will likely benefit from surgical consultation as outpatient  Hyperlipidemia -Continue atorvastatin  D-dimer elevation -D-dimer is elevated at 2.9 -CTA negative for large PE -No signs of DVT on exam - will check venous duplex of lower extremities  Abnormal UA -Patient found to have abnormal UA, urine culture ordered   History of CVA -Continue aspirin, Lipitor  CKD stage IIIb -Creatinine stable -At baseline -We will start normal saline at 75 mL/h in anticipation for cardiac cath in a.m. -Check BMP in a.m.    Medications     amLODipine  2.5 mg Oral Daily   aspirin EC  81 mg Oral Daily   atorvastatin  40 mg Oral Daily   dorzolamide-timolol  1 drop Both Eyes BID   latanoprost  1 drop Both Eyes QHS   Netarsudil Dimesylate  1 drop Both Eyes QPM     Data Reviewed:    CBG:  No results for input(s): "GLUCAP" in the last 168 hours.  SpO2: 96 %    Vitals:   06/06/22 1930 06/07/22 0525 06/07/22 0742 06/07/22 0851  BP: (!) 161/71 (!) 147/63 (!) 135/56 138/60  Pulse: 64 66 64   Resp: '18 15 19   '$ Temp: 97.8 F (36.6 C) 97.6 F (36.4 C) (!) 97.5 F (36.4 C)   TempSrc: Oral Oral Oral   SpO2: 98% 95% 96%   Weight:  59.5 kg    Height:          Data Reviewed:  Basic Metabolic Panel: Recent Labs  Lab 06/05/22 0309 06/06/22 0024  NA 138 139  K 3.9 4.1  CL 107 107  CO2 20* 23  GLUCOSE 116* 115*  BUN 34* 26*  CREATININE 1.65* 1.74*  CALCIUM 8.9 8.8*  MG 2.0  --     CBC: Recent Labs  Lab 06/05/22 0309 06/06/22 0024 06/07/22 0027  WBC 11.6* 9.8 8.0  HGB 12.4 11.9* 11.3*  HCT 37.9 36.3 33.3*  MCV 97.7 97.1 95.7  PLT 256 235 205    LFT Recent Labs  Lab 06/05/22 0310  AST 17  ALT 11  ALKPHOS 60  BILITOT 0.5  PROT 7.0  ALBUMIN 3.9     Antibiotics: Anti-infectives (From admission, onward)    None        DVT prophylaxis: Heparin  Code Status: Full code  Family Communication: Discussed with patient's sons at bedside.  He is upset that patient was unable  to get out of bed to urinate by herself.  Patient has IV heparin running through IV.  Called patient's nurse and discussed the issue with patient nurse at bedside.  Patient nurse suggested that patient could sit in the chair and would have to call her to go to the bathroom.   CONSULTS cardiology   Objective    Physical Examination:   General-appears in no acute distress Heart-S1-S2, regular, no murmur auscultated Lungs-clear to auscultation bilaterally, no wheezing or crackles auscultated Abdomen-soft, nontender, no organomegaly Extremities-trace edema in the right lower extremity Neuro-alert, oriented x3, no focal deficit noted   Status is: Inpatient:             Oswald Hillock   [facility] If 7PM-7AM, please contact night-coverage at  [email or web address], Office  [phone]   06/07/2022, 9:24 AM  LOS: 1 day

## 2022-06-07 NOTE — Progress Notes (Signed)
Mobility Specialist Progress Note:   06/07/22 1107  Mobility  Activity Ambulated with assistance in hallway  Level of Assistance Contact guard assist, steadying assist  Assistive Device Front wheel walker  Distance Ambulated (ft) 500 ft  Activity Response Tolerated well  Mobility Referral Yes  $Mobility charge 1 Mobility   Pt received in bed and eager. Mild unsteadiness upon standing, self corrected. No complaints throughout ambulation. Pt left in chair with all needs met and call bell in reach.   Andrey Campanile Mobility Specialist Please contact via SecureChat or  Rehab office at [phone]

## 2022-06-07 NOTE — Progress Notes (Signed)
Progress Note  Patient Name: Laurie [name] Date of Encounter: 06/07/2022  Primary Cardiologist: None   Subjective   Pt reports sudden onset weakness with nausea, intermittent bilateral arm pain radiating to jaw starting yesterday, relieved with nitroglycerin.  Today, she has no complaints - no chest pain, no shortness of breath.  Son is upset with the care her mother is receiving. States that we haven't been doing anything other than a single test and is very upset that his mother spent 15 minutes waiting for staff to help her off the commode. The patient is irritated that she isn't allowed to get out of bed without assistance. She states that she will not stay another night here.  Inpatient Medications    Scheduled Meds:  amLODipine  2.5 mg Oral Daily   aspirin EC  81 mg Oral Daily   atorvastatin  40 mg Oral Daily   dorzolamide-timolol  1 drop Both Eyes BID   latanoprost  1 drop Both Eyes QHS   Netarsudil Dimesylate  1 drop Both Eyes QPM   Continuous Infusions:  heparin 700 Units/hr (06/07/22 0654)   PRN Meds: acetaminophen, carboxymethylcellul-glycerin, nitroGLYCERIN   Vital Signs    Vitals:   06/07/22 0525 06/07/22 0742 06/07/22 0851 06/07/22 1001  BP: (!) 147/63 (!) 135/56 138/60 (!) 161/76  Pulse: 66 64    Resp: 15 19    Temp: 97.6 F (36.4 C) (!) 97.5 F (36.4 C)    TempSrc: Oral Oral    SpO2: 95% 96%    Weight: 59.5 kg     Height:        Intake/Output Summary (Last 24 hours) at 06/07/2022 1046 Last data filed at 06/07/2022 0500 Gross per 24 hour  Intake 363 ml  Output 1225 ml  Net -862 ml   Filed Weights   06/05/22 1850 06/06/22 0523 06/07/22 0525  Weight: 62.2 kg 59.9 kg 59.5 kg    Telemetry    Sinus rhythm - Personally Reviewed  ECG    Normal sinus rhythm, old septal MI - Personally Reviewed  Physical Exam  GEN: No acute distress.   Neck: No JVD Cardiac: RRR, no murmurs, rubs, or gallops.  Respiratory: Clear to auscultation  bilaterally. GI: Soft, nontender, non-distended  MS: No edema; No deformity. Neuro:  Nonfocal  Psych: Normal affect   Labs    Chemistry Recent Labs  Lab 06/05/22 0309 06/05/22 0310 06/06/22 0024  NA 138  --  139  K 3.9  --  4.1  CL 107  --  107  CO2 20*  --  23  GLUCOSE 116*  --  115*  BUN 34*  --  26*  CREATININE 1.65*  --  1.74*  CALCIUM 8.9  --  8.8*  PROT  --  7.0  --   ALBUMIN  --  3.9  --   AST  --  17  --   ALT  --  11  --   ALKPHOS  --  60  --   BILITOT  --  0.5  --   GFRNONAA 30*  --  28*  ANIONGAP 11  --  9     Hematology Recent Labs  Lab 06/05/22 0309 06/06/22 0024 06/07/22 0027  WBC 11.6* 9.8 8.0  RBC 3.88 3.74* 3.48*  HGB 12.4 11.9* 11.3*  HCT 37.9 36.3 33.3*  MCV 97.7 97.1 95.7  MCH 32.0 31.8 32.5  MCHC 32.7 32.8 33.9  RDW 13.2 13.1 13.2  PLT 256 235 205  Cardiac EnzymesNo results for input(s): "TROPONINI" in the last 168 hours. No results for input(s): "TROPIPOC" in the last 168 hours.   BNP Recent Labs  Lab 06/05/22 0309  BNP 95.7     DDimer  Recent Labs  Lab 06/05/22 0602  DDIMER 2.90*     Radiology    ECHOCARDIOGRAM COMPLETE  Result Date: 06/06/2022    ECHOCARDIOGRAM REPORT   Patient Name:   Laurie [name] Date of Exam: 06/06/2022 Medical Rec #:  [id number]          Height:       59.0 in Accession #:    [id number]         Weight:       132.1 lb Date of Birth:  Jun 29, 1932          BSA:          1.546 m Patient Age:    61 years           BP:           113/63 mmHg Patient Gender: F                  HR:           59 bpm. Exam Location:  Inpatient Procedure: 2D Echo, Cardiac Doppler and Color Doppler Indications:    Elevated troponin  History:        Patient has prior history of Echocardiogram examinations, most                 recent 12/29/2021. Risk Factors:Dyslipidemia, Hypertension and                 Former Smoker. Hx stroke. CKD.  Sonographer:    Clayton Lefort RDCS (AE) Referring Phys: Wandra Feinstein RATHORE IMPRESSIONS  1. Left  ventricular ejection fraction, by estimation, is 65 to 70%. The left ventricle has normal function. The left ventricle has no regional wall motion abnormalities. There is mild left ventricular hypertrophy. Left ventricular diastolic parameters are consistent with Grade I diastolic dysfunction (impaired relaxation).  2. Right ventricular systolic function is normal. The right ventricular size is normal.  3. The mitral valve is abnormal. No evidence of mitral valve regurgitation. No evidence of mitral stenosis.  4. The aortic valve is abnormal. Aortic valve regurgitation is not visualized. No aortic stenosis is present.  5. The inferior vena cava is normal in size with greater than 50% respiratory variability, suggesting right atrial pressure of 3 mmHg. Comparison(s): No significant change from prior study. FINDINGS  Left Ventricle: Left ventricular ejection fraction, by estimation, is 65 to 70%. The left ventricle has normal function. The left ventricle has no regional wall motion abnormalities. The left ventricular internal cavity size was normal in size. There is  mild left ventricular hypertrophy. Left ventricular diastolic parameters are consistent with Grade I diastolic dysfunction (impaired relaxation). Right Ventricle: The right ventricular size is normal. No increase in right ventricular wall thickness. Right ventricular systolic function is normal. Left Atrium: Left atrial size was normal in size. Right Atrium: Right atrial size was normal in size. Pericardium: There is no evidence of pericardial effusion. Mitral Valve: The mitral valve is abnormal. Normal mobility of the mitral valve leaflets. Mild to moderate mitral annular calcification. No evidence of mitral valve regurgitation. No evidence of mitral valve stenosis. Tricuspid Valve: The tricuspid valve is not well visualized. Tricuspid valve regurgitation is not demonstrated. No evidence of tricuspid stenosis. Aortic Valve: The aortic valve is abnormal.  There is moderate aortic valve annular calcification. Aortic valve regurgitation is not visualized. No aortic stenosis is present. Aortic valve mean gradient measures 2.0 mmHg. Aortic valve peak gradient measures 3.9 mmHg. Aortic valve area, by VTI measures 2.01 cm. Pulmonic Valve: The pulmonic valve was not well visualized. Pulmonic valve regurgitation is not visualized. No evidence of pulmonic stenosis. Aorta: The aortic root is normal in size and structure. Venous: The inferior vena cava is normal in size with greater than 50% respiratory variability, suggesting right atrial pressure of 3 mmHg. IAS/Shunts: No atrial level shunt detected by color flow Doppler.  LEFT VENTRICLE PLAX 2D LVIDd:         4.20 cm   Diastology LVIDs:         2.70 cm   LV e' medial:    4.90 cm/s LV PW:         1.20 cm   LV E/e' medial:  15.9 LV IVS:        1.30 cm   LV e' lateral:   6.96 cm/s LVOT diam:     1.80 cm   LV E/e' lateral: 11.2 LV SV:         48 LV SV Index:   31 LVOT Area:     2.54 cm  RIGHT VENTRICLE             IVC RV Basal diam:  2.50 cm     IVC diam: 2.00 cm RV S prime:     14.00 cm/s TAPSE (M-mode): 2.3 cm LEFT ATRIUM           Index        RIGHT ATRIUM          Index LA diam:      2.30 cm 1.49 cm/m   RA Area:     6.93 cm LA Vol (A2C): 17.1 ml 11.06 ml/m  RA Volume:   12.10 ml 7.83 ml/m LA Vol (A4C): 15.1 ml 9.77 ml/m  AORTIC VALVE AV Area (Vmax):    2.16 cm AV Area (Vmean):   2.00 cm AV Area (VTI):     2.01 cm AV Vmax:           98.20 cm/s AV Vmean:          69.300 cm/s AV VTI:            0.238 m AV Peak Grad:      3.9 mmHg AV Mean Grad:      2.0 mmHg LVOT Vmax:         83.30 cm/s LVOT Vmean:        54.500 cm/s LVOT VTI:          0.188 m LVOT/AV VTI ratio: 0.79  AORTA Ao Root diam: 2.80 cm Ao Asc diam:  3.20 cm MITRAL VALVE MV Area (PHT): 3.54 cm    SHUNTS MV Decel Time: 214 msec    Systemic VTI:  0.19 m MV E velocity: 78.00 cm/s  Systemic Diam: 1.80 cm MV A velocity: 99.30 cm/s MV E/A ratio:  0.79 Vishnu Priya  Mallipeddi Electronically signed by Lorelee Cover Mallipeddi Signature Date/Time: 06/06/2022/2:49:10 PM    Final     Cardiac Studies   TTE with normal function, no WMA  Patient Profile     88 y.o. female with a hx of HTN (not on medication), HLD, CVA, CKD stage IIIb, history of SBO status post left hemicolectomy, former smoker who was admitted 06/05/2022 for the evaluation of "atypical ACS." She  presented with sudden onset nausea, weakness, and intermittent arm and jaw pain.  hsTnI peaked at 796. ECG showed possible old septal MI but no evidence of acute ischemia.  Assessment & Plan    NSTEMI: troponin recheck remains elevated at ~ 800       - continue heparin drip, ASA 81       - I recommend coronary angiogram. Please make NPO after midnight.  Elevated LDL: on home atorvastatin. HTN: BP controlled today on amlodipine Hiatal hernia: significant herniation on CT. Could be alternate cause of nausea Abnormal UA: urine culture with insignificant growth   For questions or updates, please contact Maurice Please consult [email or web address] for contact info under Cardiology/STEMI.      Signed, Melida Quitter, MD 06/07/2022, 10:46 AM

## 2022-06-07 NOTE — Progress Notes (Signed)
ANTICOAGULATION CONSULT NOTE   Pharmacy Consult for heparin Indication: chest pain/ACS  Allergies  Allergen Reactions   Actonel [Risedronate] Nausea And Vomiting   Fosamax [Alendronate] Nausea And Vomiting   Miacalcin Other (See Comments)    Unknown reaction   Neomycin Other (See Comments)    Unknown reaction   Neosporin [Neomycin-Polymyxin-Gramicidin] Swelling   Neurontin [Gabapentin] Other (See Comments)    Confusion   Codeine Palpitations    Patient Measurements: Height: '4\' 11"'$  (149.9 cm) Weight: 59.5 kg (131 lb 1.6 oz) IBW/kg (Calculated) : 43.2 HEPARIN DW (KG): 56.5   Vital Signs: Temp: 97.5 F (36.4 C) (11/12 0742) Temp Source: Oral (11/12 0742) BP: 135/56 (11/12 0742) Pulse Rate: 64 (11/12 0742)  Labs: Recent Labs    06/05/22 0309 06/05/22 0526 06/06/22 0024 06/06/22 0403 06/06/22 0644 06/06/22 1447 06/06/22 1809 06/06/22 1953 06/07/22 0027  HGB 12.4  --  11.9*  --   --   --   --   --  11.3*  HCT 37.9  --  36.3  --   --   --   --   --  33.3*  PLT 256  --  235  --   --   --   --   --  205  HEPARINUNFRC  --   --   --   --  0.64 0.49  --   --  0.50  CREATININE 1.65*  --  1.74*  --   --   --   --   --   --   TROPONINIHS 8   < > 763* 633*  --   --  877* 749*  --    < > = values in this interval not displayed.     Estimated Creatinine Clearance: 17.5 mL/min (A) (by C-G formula based on SCr of 1.74 mg/dL (H)).   Medical History: Past Medical History:  Diagnosis Date   CKD (chronic kidney disease), stage III (HCC)    Family history of breast cancer    mother   Family history of colon cancer    father   Glaucoma    Osteopenia    SBO (small bowel obstruction) (HCC)     Medications:  Medications Prior to Admission  Medication Sig Dispense Refill Last Dose   acetaminophen (TYLENOL) 500 MG tablet Take 1,000 mg by mouth every 6 (six) hours as needed.   Unk   amLODipine (NORVASC) 2.5 MG tablet Take 2.5 mg by mouth daily.   06/04/2022   aspirin EC 81  MG tablet Take 1 tablet (81 mg total) by mouth daily. Swallow whole. 30 tablet 12 06/04/2022   atorvastatin (LIPITOR) 40 MG tablet Take 1 tablet (40 mg total) by mouth daily. 30 tablet 11 06/04/2022   brimonidine (ALPHAGAN) 0.2 % ophthalmic solution Place 1 drop into both eyes 3 (three) times daily.   Unk   dorzolamide-timolol (COSOPT) 22.3-6.8 MG/ML ophthalmic solution 1 drop 2 (two) times daily.     06/05/2022   latanoprost (XALATAN) 0.005 % ophthalmic solution Place 1 drop into both eyes every evening.   06/04/2022   OPTIVE 0.5-0.9 % ophthalmic solution Place 1 drop into both eyes 4 (four) times daily as needed for dry eyes.   06/04/2022   RHOPRESSA 0.02 % SOLN Place 1 drop into both eyes every evening.   06/04/2022   Travoprost, BAK Free, (TRAVATAMN) 0.004 % SOLN ophthalmic solution Place 1 drop into both eyes at bedtime.    06/04/2022   diclofenac Sodium (VOLTAREN ARTHRITIS  PAIN) 1 % GEL Apply 4 g topically 4 (four) times daily. (Patient not taking: Reported on 06/05/2022) 100 g 0 Not Taking   Scheduled:   amLODipine  2.5 mg Oral Daily   aspirin EC  81 mg Oral Daily   atorvastatin  40 mg Oral Daily   dorzolamide-timolol  1 drop Both Eyes BID   latanoprost  1 drop Both Eyes QHS   Netarsudil Dimesylate  1 drop Both Eyes QPM    Assessment: 88 yo female with elevated troponin to start heparin for r/o ACS, treating as NSTEMI. No anticoagulants noted PTA.   Heparin level therapeutic and stable at 0.50. No issues with infusion or bleeding noted. Hgb 11.3 and PLT 205 stable.   Goal of Therapy:  Heparin level 0.3-0.7 units/ml Monitor platelets by anticoagulation protocol: Yes   Plan:  Continue heparin at 700 units/hour Daily heparin level and CBC  Monitor for s/sx bleeding  F/u plans for potential LHC   Eliseo Gum, PharmD PGY1 Pharmacy Resident

## 2022-06-08 ENCOUNTER — Inpatient Hospital Stay (HOSPITAL_COMMUNITY)
Admission: EM | Disposition: A | Discharge status: Home / Self Care | Payer: Self-pay | Source: Home / Self Care | Attending: Family Medicine

## 2022-06-08 ENCOUNTER — Encounter (HOSPITAL_COMMUNITY): Payer: Medicare Other

## 2022-06-08 DIAGNOSIS — R7989 Other specified abnormal findings of blood chemistry: Secondary | ICD-10-CM | POA: Diagnosis not present

## 2022-06-08 DIAGNOSIS — N1832 Chronic kidney disease, stage 3b: Secondary | ICD-10-CM

## 2022-06-08 DIAGNOSIS — I2510 Atherosclerotic heart disease of native coronary artery without angina pectoris: Secondary | ICD-10-CM

## 2022-06-08 DIAGNOSIS — I10 Essential (primary) hypertension: Secondary | ICD-10-CM | POA: Diagnosis not present

## 2022-06-08 HISTORY — PX: CORONARY STENT INTERVENTION: CATH118234

## 2022-06-08 HISTORY — PX: LEFT HEART CATH AND CORONARY ANGIOGRAPHY: CATH118249

## 2022-06-08 LAB — BASIC METABOLIC PANEL
Anion gap: 8 (ref 5–15)
BUN: 26 mg/dL — ABNORMAL HIGH (ref 8–23)
CO2: 20 mmol/L — ABNORMAL LOW (ref 22–32)
Calcium: 8.8 mg/dL — ABNORMAL LOW (ref 8.9–10.3)
Chloride: 111 mmol/L (ref 98–111)
Creatinine, Ser: 1.76 mg/dL — ABNORMAL HIGH (ref 0.44–1.00)
GFR, Estimated: 27 mL/min — ABNORMAL LOW (ref >60)
Glucose, Bld: 113 mg/dL — ABNORMAL HIGH (ref 70–99)
Potassium: 4.3 mmol/L (ref 3.5–5.1)
Sodium: 139 mmol/L (ref 135–145)

## 2022-06-08 LAB — CBC
HCT: 38.1 % (ref 36.0–46.0)
Hemoglobin: 12.4 g/dL (ref 12.0–15.0)
MCH: 31.6 pg (ref 26.0–34.0)
MCHC: 32.5 g/dL (ref 30.0–36.0)
MCV: 96.9 fL (ref 80.0–100.0)
Platelets: 240 10*3/uL (ref 150–400)
RBC: 3.93 MIL/uL (ref 3.87–5.11)
RDW: 13.1 % (ref 11.5–15.5)
WBC: 9.5 10*3/uL (ref 4.0–10.5)
nRBC: 0 % (ref 0.0–0.2)

## 2022-06-08 LAB — POCT ACTIVATED CLOTTING TIME
Activated Clotting Time: 317 seconds
Activated Clotting Time: 239 seconds
Activated Clotting Time: 245 seconds
Activated Clotting Time: 323 seconds
Activated Clotting Time: 203 seconds
Activated Clotting Time: 179 seconds

## 2022-06-08 LAB — HEPARIN LEVEL (UNFRACTIONATED): Heparin Unfractionated: 0.38 IU/mL (ref 0.30–0.70)

## 2022-06-08 SURGERY — LEFT HEART CATH AND CORONARY ANGIOGRAPHY
Anesthesia: LOCAL

## 2022-06-08 MED ORDER — ONDANSETRON HCL 4 MG/2ML IJ SOLN
4.0000 mg | Freq: Four times a day (QID) | INTRAMUSCULAR | Status: DC | PRN
  Administered 2022-06-08: 4 mg via INTRAVENOUS

## 2022-06-08 MED ORDER — TICAGRELOR 90 MG PO TABS
90.0000 mg | ORAL_TABLET | Freq: Two times a day (BID) | ORAL | Status: DC
  Administered 2022-06-08 – 2022-06-09 (×2): 90 mg via ORAL
  Filled 2022-06-08 (×2): qty 1

## 2022-06-08 MED ORDER — TICAGRELOR 90 MG PO TABS
ORAL_TABLET | ORAL | Status: AC
  Filled 2022-06-08: qty 2

## 2022-06-08 MED ORDER — NITROGLYCERIN 1 MG/10 ML FOR IR/CATH LAB
INTRA_ARTERIAL | Status: AC
  Filled 2022-06-08: qty 10

## 2022-06-08 MED ORDER — ASPIRIN 81 MG PO CHEW: 81.0000 mg | CHEWABLE_TABLET | Freq: Every day | ORAL | Status: DC

## 2022-06-08 MED ORDER — DIAZEPAM 5 MG PO TABS
ORAL_TABLET | ORAL | Status: AC
  Filled 2022-06-08: qty 1

## 2022-06-08 MED ORDER — IOHEXOL 350 MG/ML SOLN
INTRAVENOUS | Status: DC | PRN
  Administered 2022-06-08: 125 mL

## 2022-06-08 MED ORDER — ATORVASTATIN CALCIUM 40 MG PO TABS: 40.0000 mg | ORAL_TABLET | Freq: Every day | ORAL | Status: DC

## 2022-06-08 MED ORDER — NITROGLYCERIN IN D5W 200-5 MCG/ML-% IV SOLN
INTRAVENOUS | Status: AC
  Filled 2022-06-08: qty 250

## 2022-06-08 MED ORDER — ENOXAPARIN SODIUM 30 MG/0.3ML IJ SOSY: 30.0000 mg | PREFILLED_SYRINGE | INTRAMUSCULAR | Status: DC

## 2022-06-08 MED ORDER — HEPARIN (PORCINE) IN NACL 1000-0.9 UT/500ML-% IV SOLN
INTRAVENOUS | Status: DC | PRN
  Administered 2022-06-08 (×2): 500 mL

## 2022-06-08 MED ORDER — VERAPAMIL HCL 2.5 MG/ML IV SOLN
INTRAVENOUS | Status: AC
  Filled 2022-06-08: qty 2

## 2022-06-08 MED ORDER — HYDRALAZINE HCL 20 MG/ML IJ SOLN
10.0000 mg | INTRAMUSCULAR | Status: AC | PRN
  Administered 2022-06-08: 10 mg via INTRAVENOUS

## 2022-06-08 MED ORDER — FENTANYL CITRATE (PF) 100 MCG/2ML IJ SOLN
INTRAMUSCULAR | Status: AC
  Filled 2022-06-08: qty 2

## 2022-06-08 MED ORDER — LIDOCAINE HCL (PF) 1 % IJ SOLN
INTRAMUSCULAR | Status: DC | PRN
  Administered 2022-06-08: 2 mL

## 2022-06-08 MED ORDER — HEPARIN (PORCINE) IN NACL 1000-0.9 UT/500ML-% IV SOLN
INTRAVENOUS | Status: AC
  Filled 2022-06-08: qty 1000

## 2022-06-08 MED ORDER — NITROGLYCERIN IN D5W 200-5 MCG/ML-% IV SOLN
INTRAVENOUS | Status: DC | PRN
  Administered 2022-06-08: 10 ug/min via INTRAVENOUS

## 2022-06-08 MED ORDER — SODIUM CHLORIDE 0.9 % IV SOLN: INTRAVENOUS | Status: AC

## 2022-06-08 MED ORDER — LABETALOL HCL 5 MG/ML IV SOLN: 10.0000 mg | INTRAVENOUS | Status: AC | PRN

## 2022-06-08 MED ORDER — TICAGRELOR 90 MG PO TABS
ORAL_TABLET | ORAL | Status: DC | PRN
  Administered 2022-06-08: 180 mg via ORAL

## 2022-06-08 MED ORDER — NITROGLYCERIN 1 MG/10 ML FOR IR/CATH LAB
INTRA_ARTERIAL | Status: DC | PRN
  Administered 2022-06-08 (×4): 200 ug via INTRACORONARY

## 2022-06-08 MED ORDER — SODIUM CHLORIDE 0.9 % IV SOLN: 250.0000 mL | INTRAVENOUS | Status: DC | PRN

## 2022-06-08 MED ORDER — ONDANSETRON HCL 4 MG/2ML IJ SOLN
INTRAMUSCULAR | Status: AC
  Filled 2022-06-08: qty 2

## 2022-06-08 MED ORDER — PANTOPRAZOLE SODIUM 40 MG PO TBEC
40.0000 mg | DELAYED_RELEASE_TABLET | Freq: Every day | ORAL | Status: DC
  Administered 2022-06-08 – 2022-06-09 (×2): 40 mg via ORAL
  Filled 2022-06-08 (×2): qty 1

## 2022-06-08 MED ORDER — SODIUM CHLORIDE 0.9 % WEIGHT BASED INFUSION: 1.0000 mL/kg/h | INTRAVENOUS | Status: DC

## 2022-06-08 MED ORDER — FENTANYL CITRATE (PF) 100 MCG/2ML IJ SOLN
INTRAMUSCULAR | Status: DC | PRN
  Administered 2022-06-08: 12.5 ug via INTRAVENOUS

## 2022-06-08 MED ORDER — HYDRALAZINE HCL 20 MG/ML IJ SOLN
INTRAMUSCULAR | Status: AC
  Filled 2022-06-08: qty 1

## 2022-06-08 MED ORDER — SODIUM CHLORIDE 0.9% FLUSH: 3.0000 mL | INTRAVENOUS | Status: DC | PRN

## 2022-06-08 MED ORDER — SODIUM CHLORIDE 0.9% FLUSH: 3.0000 mL | Freq: Two times a day (BID) | INTRAVENOUS | Status: DC

## 2022-06-08 MED ORDER — LIDOCAINE HCL (PF) 1 % IJ SOLN
INTRAMUSCULAR | Status: AC
  Filled 2022-06-08: qty 30

## 2022-06-08 MED ORDER — SODIUM CHLORIDE 0.9 % WEIGHT BASED INFUSION
3.0000 mL/kg/h | INTRAVENOUS | Status: DC
  Administered 2022-06-08: 3 mL/kg/h via INTRAVENOUS

## 2022-06-08 MED ORDER — HEPARIN SODIUM (PORCINE) 1000 UNIT/ML IJ SOLN
INTRAMUSCULAR | Status: DC | PRN
  Administered 2022-06-08: 6000 [IU] via INTRAVENOUS
  Administered 2022-06-08: 3000 [IU] via INTRAVENOUS

## 2022-06-08 MED ORDER — ACETAMINOPHEN 325 MG PO TABS
650.0000 mg | ORAL_TABLET | ORAL | Status: DC | PRN
  Administered 2022-06-08: 650 mg via ORAL
  Filled 2022-06-08: qty 2

## 2022-06-08 MED ORDER — DIAZEPAM 5 MG PO TABS
2.5000 mg | ORAL_TABLET | ORAL | Status: DC | PRN
  Administered 2022-06-08 – 2022-06-09 (×3): 2.5 mg via ORAL
  Filled 2022-06-08 (×2): qty 1

## 2022-06-08 MED ORDER — ASPIRIN 81 MG PO CHEW
81.0000 mg | CHEWABLE_TABLET | ORAL | Status: AC
  Administered 2022-06-08: 81 mg via ORAL
  Filled 2022-06-08: qty 1

## 2022-06-08 MED ORDER — MIDAZOLAM HCL 2 MG/2ML IJ SOLN
INTRAMUSCULAR | Status: AC
  Filled 2022-06-08: qty 2

## 2022-06-08 MED ORDER — MIDAZOLAM HCL 2 MG/2ML IJ SOLN
INTRAMUSCULAR | Status: DC | PRN
  Administered 2022-06-08: 1 mg via INTRAVENOUS
  Administered 2022-06-08 (×2): .5 mg via INTRAVENOUS
  Administered 2022-06-08: 1 mg via INTRAVENOUS

## 2022-06-08 MED ORDER — HEPARIN SODIUM (PORCINE) 1000 UNIT/ML IJ SOLN
INTRAMUSCULAR | Status: AC
  Filled 2022-06-08: qty 10

## 2022-06-08 SURGICAL SUPPLY — 31 items
BALLN EMERGE MR 2.5X15 (BALLOONS) ×1
BALLN SAPPHIRE 2.0X12 (BALLOONS) ×1
BALLN SPRINTER MX OTW 2.0X12 (BALLOONS) ×1
BALLN ~~LOC~~ EMERGE MR 3.75X15 (BALLOONS) ×1
BALLOON EMERGE MR 2.5X15 (BALLOONS) IMPLANT
BALLOON SAPPHIRE 2.0X12 (BALLOONS) IMPLANT
BALLOON SPRINTER MX OTW 2.0X12 (BALLOONS) IMPLANT
BALLOON ~~LOC~~ EMERGE MR 3.75X15 (BALLOONS) IMPLANT
CATH INFINITI 5FR MULTPACK ANG (CATHETERS) IMPLANT
CATH OPTITORQUE TIG 4.0 5F (CATHETERS) IMPLANT
CATH TELESCOPE 6F GEC (CATHETERS) IMPLANT
CATH VISTA GUIDE 6FR JR4 (CATHETERS) IMPLANT
CATH VISTA GUIDE 6FR XBRCA (CATHETERS) IMPLANT
ELECT DEFIB PAD ADLT CADENCE (PAD) IMPLANT
GLIDESHEATH SLEND SS 6F .021 (SHEATH) IMPLANT
GUIDEWIRE INQWIRE 1.5J.035X260 (WIRE) IMPLANT
HEMOSTAT KELLY 5.5 SS STRL (MISCELLANEOUS) IMPLANT
INQWIRE 1.5J .035X260CM (WIRE) ×1
KIT ENCORE 26 ADVANTAGE (KITS) IMPLANT
KIT HEART LEFT (KITS) ×1 IMPLANT
PACK CARDIAC CATHETERIZATION (CUSTOM PROCEDURE TRAY) ×1 IMPLANT
SHEATH PINNACLE 5F 10CM (SHEATH) IMPLANT
SHEATH PINNACLE 6F 10CM (SHEATH) IMPLANT
SHEATH PROBE COVER 6X72 (BAG) IMPLANT
STENT ONYX FRONTIER 3.5X22 (Permanent Stent) IMPLANT
STOPCOCK MORSE 400PSI 3WAY (MISCELLANEOUS) IMPLANT
TRANSDUCER W/STOPCOCK (MISCELLANEOUS) ×1 IMPLANT
TUBING CIL FLEX 10 FLL-RA (TUBING) ×1 IMPLANT
WIRE COUGAR XT STRL 190CM (WIRE) IMPLANT
WIRE EMERALD 3MM-J .035X150CM (WIRE) IMPLANT
WIRE HI TORQ WHISPER MS 300CM (WIRE) IMPLANT

## 2022-06-08 NOTE — Interval H&P Note (Signed)
Cath Lab Visit (complete for each Cath Lab visit)  Clinical Evaluation Leading to the Procedure:   ACS: Yes.    Non-ACS:    Anginal Classification: CCS III  Anti-ischemic medical therapy: Minimal Therapy (1 class of medications)  Non-Invasive Test Results: No non-invasive testing performed  Prior CABG: No previous CABG      History and Physical Interval Note:  06/08/2022 12:53 PM  Laurie [name]  has presented today for surgery, with the diagnosis of NSTEMI.  The various methods of treatment have been discussed with the patient and family. After consideration of risks, benefits and other options for treatment, the patient has consented to  Procedure(s): LEFT HEART CATH AND CORONARY ANGIOGRAPHY (N/A) as a surgical intervention.  The patient's history has been reviewed, patient examined, no change in status, stable for surgery.  I have reviewed the patient's chart and labs.  Questions were answered to the patient's satisfaction.     Shelva Majestic

## 2022-06-08 NOTE — Progress Notes (Signed)
[facility] PROGRESS NOTE    Progress Note  MIRENDA BALTAZAR  NGE:[id number] DOB: 1932-02-01 DOA: 06/05/2022 PCP: Lawerance Cruel, MD     Brief Narrative:   DEANZA UPPERMAN is an 88 y.o. female past medical history significant for essential hypertension, chronic kidney stage IIIb, history of small bowel obstruction with a left hemicolectomy comes in with sudden onset of nausea neck and jaw pain CTA of the chest was negative for PE but did show a hiatal hernia, there was a rise in troponins cardiology was consulted started on IV heparin and aspirin, for NSTEMI.   Assessment/Plan:   NSTEMI: Started on IV heparin, aspirin not on a beta-blocker due to heart rate of 50s and 60s. For cardiac cath today.  Essential hypertension: Continue amlodipine blood pressure stable.  Hiatal hernia: Half of the stomach seen in tr thoracically. Does not warrant surgery, will likely benefit for surgical consultation as an outpatient. Patient is symptomatic continue PPI.  Hyperlipidemia: Continue statins.  Abnormal UA: Urine culture has been ordered.  History of CVA: Continue aspirin and statins.  Chronic kidney disease stage IIIb: Creatinine at baseline currently on fluids anticipating cardiac cath  DVT prophylaxis: heparin Family Communication:son Status is: Inpatient Remains inpatient appropriate because: Follow-up NSTEMI    Code Status:     Code Status Orders  (From admission, onward)           Start     Ordered   06/05/22 2047  Full code  Continuous        06/05/22 2049           Code Status History     Date Active Date Inactive Code Status Order ID Comments User Context   12/29/2021 0941 12/30/2021 2040 Full Code [id number]  Norval Morton, MD Inpatient         IV Access:   Peripheral IV   Procedures and diagnostic studies:   ECHOCARDIOGRAM COMPLETE  Result Date: 06/06/2022    ECHOCARDIOGRAM REPORT   Patient Name:   LORRIANN HANSMANN  Date of Exam: 06/06/2022 Medical Rec #:  [id number]          Height:       59.0 in Accession #:    [id number]         Weight:       132.1 lb Date of Birth:  1931-12-22          BSA:          1.546 m Patient Age:    79 years           BP:           113/63 mmHg Patient Gender: F                  HR:           59 bpm. Exam Location:  Inpatient Procedure: 2D Echo, Cardiac Doppler and Color Doppler Indications:    Elevated troponin  History:        Patient has prior history of Echocardiogram examinations, most                 recent 12/29/2021. Risk Factors:Dyslipidemia, Hypertension and                 Former Smoker. Hx stroke. CKD.  Sonographer:    Clayton Lefort RDCS (AE) Referring Phys: Wandra Feinstein RATHORE IMPRESSIONS  1. Left ventricular ejection fraction, by estimation, is 65 to 70%. The left ventricle has normal  function. The left ventricle has no regional wall motion abnormalities. There is mild left ventricular hypertrophy. Left ventricular diastolic parameters are consistent with Grade I diastolic dysfunction (impaired relaxation).  2. Right ventricular systolic function is normal. The right ventricular size is normal.  3. The mitral valve is abnormal. No evidence of mitral valve regurgitation. No evidence of mitral stenosis.  4. The aortic valve is abnormal. Aortic valve regurgitation is not visualized. No aortic stenosis is present.  5. The inferior vena cava is normal in size with greater than 50% respiratory variability, suggesting right atrial pressure of 3 mmHg. Comparison(s): No significant change from prior study. FINDINGS  Left Ventricle: Left ventricular ejection fraction, by estimation, is 65 to 70%. The left ventricle has normal function. The left ventricle has no regional wall motion abnormalities. The left ventricular internal cavity size was normal in size. There is  mild left ventricular hypertrophy. Left ventricular diastolic parameters are consistent with Grade I diastolic dysfunction (impaired  relaxation). Right Ventricle: The right ventricular size is normal. No increase in right ventricular wall thickness. Right ventricular systolic function is normal. Left Atrium: Left atrial size was normal in size. Right Atrium: Right atrial size was normal in size. Pericardium: There is no evidence of pericardial effusion. Mitral Valve: The mitral valve is abnormal. Normal mobility of the mitral valve leaflets. Mild to moderate mitral annular calcification. No evidence of mitral valve regurgitation. No evidence of mitral valve stenosis. Tricuspid Valve: The tricuspid valve is not well visualized. Tricuspid valve regurgitation is not demonstrated. No evidence of tricuspid stenosis. Aortic Valve: The aortic valve is abnormal. There is moderate aortic valve annular calcification. Aortic valve regurgitation is not visualized. No aortic stenosis is present. Aortic valve mean gradient measures 2.0 mmHg. Aortic valve peak gradient measures 3.9 mmHg. Aortic valve area, by VTI measures 2.01 cm. Pulmonic Valve: The pulmonic valve was not well visualized. Pulmonic valve regurgitation is not visualized. No evidence of pulmonic stenosis. Aorta: The aortic root is normal in size and structure. Venous: The inferior vena cava is normal in size with greater than 50% respiratory variability, suggesting right atrial pressure of 3 mmHg. IAS/Shunts: No atrial level shunt detected by color flow Doppler.  LEFT VENTRICLE PLAX 2D LVIDd:         4.20 cm   Diastology LVIDs:         2.70 cm   LV e' medial:    4.90 cm/s LV PW:         1.20 cm   LV E/e' medial:  15.9 LV IVS:        1.30 cm   LV e' lateral:   6.96 cm/s LVOT diam:     1.80 cm   LV E/e' lateral: 11.2 LV SV:         48 LV SV Index:   31 LVOT Area:     2.54 cm  RIGHT VENTRICLE             IVC RV Basal diam:  2.50 cm     IVC diam: 2.00 cm RV S prime:     14.00 cm/s TAPSE (M-mode): 2.3 cm LEFT ATRIUM           Index        RIGHT ATRIUM          Index LA diam:      2.30 cm 1.49 cm/m    RA Area:     6.93 cm LA Vol (A2C): 17.1 ml 11.06 ml/m  RA Volume:   12.10 ml 7.83 ml/m LA Vol (A4C): 15.1 ml 9.77 ml/m  AORTIC VALVE AV Area (Vmax):    2.16 cm AV Area (Vmean):   2.00 cm AV Area (VTI):     2.01 cm AV Vmax:           98.20 cm/s AV Vmean:          69.300 cm/s AV VTI:            0.238 m AV Peak Grad:      3.9 mmHg AV Mean Grad:      2.0 mmHg LVOT Vmax:         83.30 cm/s LVOT Vmean:        54.500 cm/s LVOT VTI:          0.188 m LVOT/AV VTI ratio: 0.79  AORTA Ao Root diam: 2.80 cm Ao Asc diam:  3.20 cm MITRAL VALVE MV Area (PHT): 3.54 cm    SHUNTS MV Decel Time: 214 msec    Systemic VTI:  0.19 m MV E velocity: 78.00 cm/s  Systemic Diam: 1.80 cm MV A velocity: 99.30 cm/s MV E/A ratio:  0.79 Vishnu Priya Mallipeddi Electronically signed by Lorelee Cover Mallipeddi Signature Date/Time: 06/06/2022/2:49:10 PM    Final      Medical Consultants:   None.   Subjective:    LULABELLE DESTA denies any chest pain use nitroglycerin twice at night.  Objective:    Vitals:   06/08/22 0019 06/08/22 0115 06/08/22 0214 06/08/22 0400  BP:  131/67 136/72 (!) 160/62  Pulse:   88 86  Resp:  '20 18 18  '$ Temp:   98 F (36.7 C) 98 F (36.7 C)  TempSrc:   Oral Oral  SpO2:   98% 96%  Weight: 59.5 kg     Height:       SpO2: 96 %   Intake/Output Summary (Last 24 hours) at 06/08/2022 0738 Last data filed at 06/08/2022 0650 Gross per 24 hour  Intake 695.82 ml  Output 900 ml  Net -204.18 ml   Filed Weights   06/06/22 0523 06/07/22 0525 06/08/22 0019  Weight: 59.9 kg 59.5 kg 59.5 kg    Exam: General exam: In no acute distress. Respiratory system: Good air movement and clear to auscultation. Cardiovascular system: S1 & S2 heard, RRR. No JVD. Gastrointestinal system: Abdomen is nondistended, soft and nontender.  Extremities: No pedal edema. Skin: No rashes, lesions or ulcers Psychiatry: Judgement and insight appear normal. Mood & affect appropriate.    Data Reviewed:     Labs: Basic Metabolic Panel: Recent Labs  Lab 06/05/22 0309 06/06/22 0024 06/07/22 0034 06/08/22 0033  NA 138 139 138 139  K 3.9 4.1 4.3 4.3  CL 107 107 108 111  CO2 20* 23 25 20*  GLUCOSE 116* 115* 112* 113*  BUN 34* 26* 23 26*  CREATININE 1.65* 1.74* 1.68* 1.76*  CALCIUM 8.9 8.8* 8.6* 8.8*  MG 2.0  --   --   --    GFR Estimated Creatinine Clearance: 17.3 mL/min (A) (by C-G formula based on SCr of 1.76 mg/dL (H)). Liver Function Tests: Recent Labs  Lab 06/05/22 0310  AST 17  ALT 11  ALKPHOS 60  BILITOT 0.5  PROT 7.0  ALBUMIN 3.9   No results for input(s): "LIPASE", "AMYLASE" in the last 168 hours. No results for input(s): "AMMONIA" in the last 168 hours. Coagulation profile No results for input(s): "INR", "PROTIME" in the last 168 hours. COVID-19  Labs  No results for input(s): "DDIMER", "FERRITIN", "LDH", "CRP" in the last 72 hours.  Lab Results  Component Value Date   [place] NEGATIVE 12/28/2021    CBC: Recent Labs  Lab 06/05/22 0309 06/06/22 0024 06/07/22 0027 06/08/22 0033  WBC 11.6* 9.8 8.0 9.5  HGB 12.4 11.9* 11.3* 12.4  HCT 37.9 36.3 33.3* 38.1  MCV 97.7 97.1 95.7 96.9  PLT 256 235 205 240   Cardiac Enzymes: No results for input(s): "CKTOTAL", "CKMB", "CKMBINDEX", "TROPONINI" in the last 168 hours. BNP (last 3 results) No results for input(s): "PROBNP" in the last 8760 hours. CBG: No results for input(s): "GLUCAP" in the last 168 hours. D-Dimer: No results for input(s): "DDIMER" in the last 72 hours. Hgb A1c: No results for input(s): "HGBA1C" in the last 72 hours. Lipid Profile: No results for input(s): "CHOL", "HDL", "LDLCALC", "TRIG", "CHOLHDL", "LDLDIRECT" in the last 72 hours. Thyroid function studies: No results for input(s): "TSH", "T4TOTAL", "T3FREE", "THYROIDAB" in the last 72 hours.  Invalid input(s): "FREET3" Anemia work up: No results for input(s): "VITAMINB12", "FOLATE", "FERRITIN", "TIBC", "IRON", "RETICCTPCT" in  the last 72 hours. Sepsis Labs: Recent Labs  Lab 06/05/22 0309 06/06/22 0024 06/07/22 0027 06/08/22 0033  WBC 11.6* 9.8 8.0 9.5   Microbiology No results found for this or any previous visit (from the past 240 hour(s)).   Medications:    amLODipine  2.5 mg Oral Daily   aspirin EC  81 mg Oral Daily   atorvastatin  40 mg Oral Daily   dorzolamide-timolol  1 drop Both Eyes BID   latanoprost  1 drop Both Eyes QHS   Continuous Infusions:  sodium chloride 1 mL/kg/hr (06/08/22 0645)   heparin 700 Units/hr (06/07/22 0654)      LOS: 2 days   Charlynne Cousins  [facility]  06/08/2022, 7:38 AM

## 2022-06-08 NOTE — H&P (View-Only) (Signed)
Rounding Note    Patient Name: Laurie [name] Date of Encounter: 06/08/2022  [facility] Cardiologist: Dr Myles Gip  Subjective   No CP or dyspnea; earlier she had recurrent jaw and bilateral shoulder pain relieved with nitroglycerin.  Inpatient Medications    Scheduled Meds:  amLODipine  2.5 mg Oral Daily   aspirin EC  81 mg Oral Daily   atorvastatin  40 mg Oral Daily   dorzolamide-timolol  1 drop Both Eyes BID   latanoprost  1 drop Both Eyes QHS   pantoprazole  40 mg Oral Daily   Continuous Infusions:  sodium chloride 1 mL/kg/hr (06/08/22 0645)   heparin 700 Units/hr (06/07/22 0654)   PRN Meds: acetaminophen, carboxymethylcellul-glycerin, nitroGLYCERIN   Vital Signs    Vitals:   06/08/22 0019 06/08/22 0115 06/08/22 0214 06/08/22 0400  BP:  131/67 136/72 (!) 160/62  Pulse:   88 86  Resp:  '20 18 18  '$ Temp:   98 F (36.7 C) 98 F (36.7 C)  TempSrc:   Oral Oral  SpO2:   98% 96%  Weight: 59.5 kg     Height:        Intake/Output Summary (Last 24 hours) at 06/08/2022 1028 Last data filed at 06/08/2022 0650 Gross per 24 hour  Intake 695.82 ml  Output 900 ml  Net -204.18 ml      06/08/2022   12:19 AM 06/07/2022    5:25 AM 06/06/2022    5:23 AM  Last 3 Weights  Weight (lbs) 131 lb 3.2 oz 131 lb 1.6 oz 132 lb 1.6 oz  Weight (kg) 59.512 kg 59.467 kg 59.92 kg      Telemetry    Sinus - Personally Reviewed  Physical Exam   GEN: No acute distress.   Neck: No JVD Cardiac: RRR, no murmurs, rubs, or gallops.  Respiratory: Clear to auscultation bilaterally. GI: Soft, nontender, non-distended  MS: No edema Neuro:  Nonfocal  Psych: Normal affect   Labs    High Sensitivity Troponin:   Recent Labs  Lab 06/05/22 2019 06/06/22 0024 06/06/22 0403 06/06/22 1809 06/06/22 1953  TROPONINIHS 796* 763* 633* 877* 749*     Chemistry Recent Labs  Lab 06/05/22 0309 06/05/22 0310 06/06/22 0024 06/07/22 0034 06/08/22 0033  NA 138  --  139 138  139  K 3.9  --  4.1 4.3 4.3  CL 107  --  107 108 111  CO2 20*  --  23 25 20*  GLUCOSE 116*  --  115* 112* 113*  BUN 34*  --  26* 23 26*  CREATININE 1.65*  --  1.74* 1.68* 1.76*  CALCIUM 8.9  --  8.8* 8.6* 8.8*  MG 2.0  --   --   --   --   PROT  --  7.0  --   --   --   ALBUMIN  --  3.9  --   --   --   AST  --  17  --   --   --   ALT  --  11  --   --   --   ALKPHOS  --  60  --   --   --   BILITOT  --  0.5  --   --   --   GFRNONAA 30*  --  28* 29* 27*  ANIONGAP 11  --  '9 5 8     '$ Hematology Recent Labs  Lab 06/06/22 0024 06/07/22 0027 06/08/22 0033  WBC 9.8 8.0 9.5  RBC 3.74* 3.48* 3.93  HGB 11.9* 11.3* 12.4  HCT 36.3 33.3* 38.1  MCV 97.1 95.7 96.9  MCH 31.8 32.5 31.6  MCHC 32.8 33.9 32.5  RDW 13.1 13.2 13.1  PLT 235 205 240    BNP Recent Labs  Lab 06/05/22 0309  BNP 95.7    DDimer  Recent Labs  Lab 06/05/22 0602  DDIMER 2.90*     Radiology    ECHOCARDIOGRAM COMPLETE  Result Date: 06/06/2022    ECHOCARDIOGRAM REPORT   Patient Name:   NIQUITA DIGIOIA Date of Exam: 06/06/2022 Medical Rec #:  [id number]          Height:       59.0 in Accession #:    [id number]         Weight:       132.1 lb Date of Birth:  05-11-1932          BSA:          1.546 m Patient Age:    88 years           BP:           113/63 mmHg Patient Gender: F                  HR:           59 bpm. Exam Location:  Inpatient Procedure: 2D Echo, Cardiac Doppler and Color Doppler Indications:    Elevated troponin  History:        Patient has prior history of Echocardiogram examinations, most                 recent 12/29/2021. Risk Factors:Dyslipidemia, Hypertension and                 Former Smoker. Hx stroke. CKD.  Sonographer:    Clayton Lefort RDCS (AE) Referring Phys: Wandra Feinstein RATHORE IMPRESSIONS  1. Left ventricular ejection fraction, by estimation, is 65 to 70%. The left ventricle has normal function. The left ventricle has no regional wall motion abnormalities. There is mild left ventricular hypertrophy.  Left ventricular diastolic parameters are consistent with Grade I diastolic dysfunction (impaired relaxation).  2. Right ventricular systolic function is normal. The right ventricular size is normal.  3. The mitral valve is abnormal. No evidence of mitral valve regurgitation. No evidence of mitral stenosis.  4. The aortic valve is abnormal. Aortic valve regurgitation is not visualized. No aortic stenosis is present.  5. The inferior vena cava is normal in size with greater than 50% respiratory variability, suggesting right atrial pressure of 3 mmHg. Comparison(s): No significant change from prior study. FINDINGS  Left Ventricle: Left ventricular ejection fraction, by estimation, is 65 to 70%. The left ventricle has normal function. The left ventricle has no regional wall motion abnormalities. The left ventricular internal cavity size was normal in size. There is  mild left ventricular hypertrophy. Left ventricular diastolic parameters are consistent with Grade I diastolic dysfunction (impaired relaxation). Right Ventricle: The right ventricular size is normal. No increase in right ventricular wall thickness. Right ventricular systolic function is normal. Left Atrium: Left atrial size was normal in size. Right Atrium: Right atrial size was normal in size. Pericardium: There is no evidence of pericardial effusion. Mitral Valve: The mitral valve is abnormal. Normal mobility of the mitral valve leaflets. Mild to moderate mitral annular calcification. No evidence of mitral valve regurgitation. No evidence of mitral valve stenosis. Tricuspid Valve: The tricuspid valve  is not well visualized. Tricuspid valve regurgitation is not demonstrated. No evidence of tricuspid stenosis. Aortic Valve: The aortic valve is abnormal. There is moderate aortic valve annular calcification. Aortic valve regurgitation is not visualized. No aortic stenosis is present. Aortic valve mean gradient measures 2.0 mmHg. Aortic valve peak gradient  measures 3.9 mmHg. Aortic valve area, by VTI measures 2.01 cm. Pulmonic Valve: The pulmonic valve was not well visualized. Pulmonic valve regurgitation is not visualized. No evidence of pulmonic stenosis. Aorta: The aortic root is normal in size and structure. Venous: The inferior vena cava is normal in size with greater than 50% respiratory variability, suggesting right atrial pressure of 3 mmHg. IAS/Shunts: No atrial level shunt detected by color flow Doppler.  LEFT VENTRICLE PLAX 2D LVIDd:         4.20 cm   Diastology LVIDs:         2.70 cm   LV e' medial:    4.90 cm/s LV PW:         1.20 cm   LV E/e' medial:  15.9 LV IVS:        1.30 cm   LV e' lateral:   6.96 cm/s LVOT diam:     1.80 cm   LV E/e' lateral: 11.2 LV SV:         48 LV SV Index:   31 LVOT Area:     2.54 cm  RIGHT VENTRICLE             IVC RV Basal diam:  2.50 cm     IVC diam: 2.00 cm RV S prime:     14.00 cm/s TAPSE (M-mode): 2.3 cm LEFT ATRIUM           Index        RIGHT ATRIUM          Index LA diam:      2.30 cm 1.49 cm/m   RA Area:     6.93 cm LA Vol (A2C): 17.1 ml 11.06 ml/m  RA Volume:   12.10 ml 7.83 ml/m LA Vol (A4C): 15.1 ml 9.77 ml/m  AORTIC VALVE AV Area (Vmax):    2.16 cm AV Area (Vmean):   2.00 cm AV Area (VTI):     2.01 cm AV Vmax:           98.20 cm/s AV Vmean:          69.300 cm/s AV VTI:            0.238 m AV Peak Grad:      3.9 mmHg AV Mean Grad:      2.0 mmHg LVOT Vmax:         83.30 cm/s LVOT Vmean:        54.500 cm/s LVOT VTI:          0.188 m LVOT/AV VTI ratio: 0.79  AORTA Ao Root diam: 2.80 cm Ao Asc diam:  3.20 cm MITRAL VALVE MV Area (PHT): 3.54 cm    SHUNTS MV Decel Time: 214 msec    Systemic VTI:  0.19 m MV E velocity: 78.00 cm/s  Systemic Diam: 1.80 cm MV A velocity: 99.30 cm/s MV E/A ratio:  0.79 Vishnu Priya Mallipeddi Electronically signed by Lorelee Cover Mallipeddi Signature Date/Time: 06/06/2022/2:49:10 PM    Final      Patient Profile     88 y.o. female with past medical history of hypertension,  hyperlipidemia, prior CVA, chronic stage IIIb kidney disease, former tobacco use admitted with non-ST  elevation myocardial infarction.  Echocardiogram shows normal LV function, mild left ventricular hypertrophy, grade 1 diastolic dysfunction.  Assessment & Plan    1 non-ST elevation myocardial infarction-troponin increased from admission.  Recurrent symptoms earlier today now resolved.  Plan is for cardiac catheterization.  The risk and benefits including myocardial infarction, CVA, death and worsening renal function discussed and she agrees to proceed.  Continue aspirin, heparin and statin.  Beta-blocker not added due to baseline bradycardia.  2 chronic stage IIIb kidney disease-I discussed the possibility of worsening renal function following catheterization.  We will limit dye.  Note echocardiogram showed preserved LV function.  No ventriculogram.  Recheck potassium and renal function tomorrow morning.  3 hypertension-continue present medications and follow blood pressure.  4 hyperlipidemia-continue statin.  For questions or updates, please contact [place] Please consult [email or web address] for contact info under        Signed, Kirk Ruths, MD  06/08/2022, 10:28 AM

## 2022-06-08 NOTE — Progress Notes (Signed)
Patient arrived from cath lab at 1851hrs.  Family at bedside. Right groin slight ooze, area marked, soft to palpation.  Reviewed post cath instructions.

## 2022-06-08 NOTE — Plan of Care (Signed)
  Problem: Education: Goal: Knowledge of General Education information will improve Description: Including pain rating scale, medication(s)/side effects and non-pharmacologic comfort measures Outcome: Progressing   Problem: Health Behavior/Discharge Planning: Goal: Ability to manage health-related needs will improve Outcome: Progressing   Problem: Clinical Measurements: Goal: Ability to maintain clinical measurements within normal limits will improve Outcome: Progressing Goal: Will remain free from infection Outcome: Progressing Goal: Diagnostic test results will improve Outcome: Progressing Goal: Respiratory complications will improve Outcome: Progressing Goal: Cardiovascular complication will be avoided Outcome: Progressing   Problem: Activity: Goal: Risk for activity intolerance will decrease Outcome: Progressing   Problem: Nutrition: Goal: Adequate nutrition will be maintained Outcome: Progressing   Problem: Coping: Goal: Level of anxiety will decrease Outcome: Progressing   Problem: Elimination: Goal: Will not experience complications related to bowel motility Outcome: Progressing Goal: Will not experience complications related to urinary retention Outcome: Progressing   Problem: Pain Managment: Goal: General experience of comfort will improve Outcome: Progressing   Problem: Safety: Goal: Ability to remain free from injury will improve Outcome: Progressing   Problem: Skin Integrity: Goal: Risk for impaired skin integrity will decrease Outcome: Progressing   Problem: Education: Goal: Understanding of CV disease, CV risk reduction, and recovery process will improve Outcome: Progressing Goal: Individualized Educational Video(s) Outcome: Progressing   Problem: Activity: Goal: Ability to return to baseline activity level will improve Outcome: Progressing   Problem: Cardiovascular: Goal: Ability to achieve and maintain adequate cardiovascular perfusion  will improve Outcome: Progressing Goal: Vascular access site(s) Level 0-1 will be maintained Outcome: Progressing   Problem: Health Behavior/Discharge Planning: Goal: Ability to safely manage health-related needs after discharge will improve Outcome: Progressing   Problem: Education: Goal: Knowledge of cardiac device and self-care will improve Outcome: Progressing Goal: Ability to safely manage health related needs after discharge will improve Outcome: Progressing Goal: Individualized Educational Video(s) Outcome: Progressing   Problem: Cardiac: Goal: Ability to achieve and maintain adequate cardiopulmonary perfusion will improve Outcome: Progressing

## 2022-06-08 NOTE — Progress Notes (Signed)
Rounding Note    Patient Name: Laurie [name] Date of Encounter: 06/08/2022  [facility] Cardiologist: Dr Myles Gip  Subjective   No CP or dyspnea; earlier she had recurrent jaw and bilateral shoulder pain relieved with nitroglycerin.  Inpatient Medications    Scheduled Meds:  amLODipine  2.5 mg Oral Daily   aspirin EC  81 mg Oral Daily   atorvastatin  40 mg Oral Daily   dorzolamide-timolol  1 drop Both Eyes BID   latanoprost  1 drop Both Eyes QHS   pantoprazole  40 mg Oral Daily   Continuous Infusions:  sodium chloride 1 mL/kg/hr (06/08/22 0645)   heparin 700 Units/hr (06/07/22 0654)   PRN Meds: acetaminophen, carboxymethylcellul-glycerin, nitroGLYCERIN   Vital Signs    Vitals:   06/08/22 0019 06/08/22 0115 06/08/22 0214 06/08/22 0400  BP:  131/67 136/72 (!) 160/62  Pulse:   88 86  Resp:  '20 18 18  '$ Temp:   98 F (36.7 C) 98 F (36.7 C)  TempSrc:   Oral Oral  SpO2:   98% 96%  Weight: 59.5 kg     Height:        Intake/Output Summary (Last 24 hours) at 06/08/2022 1028 Last data filed at 06/08/2022 0650 Gross per 24 hour  Intake 695.82 ml  Output 900 ml  Net -204.18 ml      06/08/2022   12:19 AM 06/07/2022    5:25 AM 06/06/2022    5:23 AM  Last 3 Weights  Weight (lbs) 131 lb 3.2 oz 131 lb 1.6 oz 132 lb 1.6 oz  Weight (kg) 59.512 kg 59.467 kg 59.92 kg      Telemetry    Sinus - Personally Reviewed  Physical Exam   GEN: No acute distress.   Neck: No JVD Cardiac: RRR, no murmurs, rubs, or gallops.  Respiratory: Clear to auscultation bilaterally. GI: Soft, nontender, non-distended  MS: No edema Neuro:  Nonfocal  Psych: Normal affect   Labs    High Sensitivity Troponin:   Recent Labs  Lab 06/05/22 2019 06/06/22 0024 06/06/22 0403 06/06/22 1809 06/06/22 1953  TROPONINIHS 796* 763* 633* 877* 749*     Chemistry Recent Labs  Lab 06/05/22 0309 06/05/22 0310 06/06/22 0024 06/07/22 0034 06/08/22 0033  NA 138  --  139 138  139  K 3.9  --  4.1 4.3 4.3  CL 107  --  107 108 111  CO2 20*  --  23 25 20*  GLUCOSE 116*  --  115* 112* 113*  BUN 34*  --  26* 23 26*  CREATININE 1.65*  --  1.74* 1.68* 1.76*  CALCIUM 8.9  --  8.8* 8.6* 8.8*  MG 2.0  --   --   --   --   PROT  --  7.0  --   --   --   ALBUMIN  --  3.9  --   --   --   AST  --  17  --   --   --   ALT  --  11  --   --   --   ALKPHOS  --  60  --   --   --   BILITOT  --  0.5  --   --   --   GFRNONAA 30*  --  28* 29* 27*  ANIONGAP 11  --  '9 5 8     '$ Hematology Recent Labs  Lab 06/06/22 0024 06/07/22 0027 06/08/22 0033  WBC 9.8 8.0 9.5  RBC 3.74* 3.48* 3.93  HGB 11.9* 11.3* 12.4  HCT 36.3 33.3* 38.1  MCV 97.1 95.7 96.9  MCH 31.8 32.5 31.6  MCHC 32.8 33.9 32.5  RDW 13.1 13.2 13.1  PLT 235 205 240    BNP Recent Labs  Lab 06/05/22 0309  BNP 95.7    DDimer  Recent Labs  Lab 06/05/22 0602  DDIMER 2.90*     Radiology    ECHOCARDIOGRAM COMPLETE  Result Date: 06/06/2022    ECHOCARDIOGRAM REPORT   Patient Name:   Laurie [name] Date of Exam: 06/06/2022 Medical Rec #:  [id number]          Height:       59.0 in Accession #:    [id number]         Weight:       132.1 lb Date of Birth:  08-21-1931          BSA:          1.546 m Patient Age:    56 years           BP:           113/63 mmHg Patient Gender: F                  HR:           59 bpm. Exam Location:  Inpatient Procedure: 2D Echo, Cardiac Doppler and Color Doppler Indications:    Elevated troponin  History:        Patient has prior history of Echocardiogram examinations, most                 recent 12/29/2021. Risk Factors:Dyslipidemia, Hypertension and                 Former Smoker. Hx stroke. CKD.  Sonographer:    Clayton Lefort RDCS (AE) Referring Phys: Wandra Feinstein RATHORE IMPRESSIONS  1. Left ventricular ejection fraction, by estimation, is 65 to 70%. The left ventricle has normal function. The left ventricle has no regional wall motion abnormalities. There is mild left ventricular hypertrophy.  Left ventricular diastolic parameters are consistent with Grade I diastolic dysfunction (impaired relaxation).  2. Right ventricular systolic function is normal. The right ventricular size is normal.  3. The mitral valve is abnormal. No evidence of mitral valve regurgitation. No evidence of mitral stenosis.  4. The aortic valve is abnormal. Aortic valve regurgitation is not visualized. No aortic stenosis is present.  5. The inferior vena cava is normal in size with greater than 50% respiratory variability, suggesting right atrial pressure of 3 mmHg. Comparison(s): No significant change from prior study. FINDINGS  Left Ventricle: Left ventricular ejection fraction, by estimation, is 65 to 70%. The left ventricle has normal function. The left ventricle has no regional wall motion abnormalities. The left ventricular internal cavity size was normal in size. There is  mild left ventricular hypertrophy. Left ventricular diastolic parameters are consistent with Grade I diastolic dysfunction (impaired relaxation). Right Ventricle: The right ventricular size is normal. No increase in right ventricular wall thickness. Right ventricular systolic function is normal. Left Atrium: Left atrial size was normal in size. Right Atrium: Right atrial size was normal in size. Pericardium: There is no evidence of pericardial effusion. Mitral Valve: The mitral valve is abnormal. Normal mobility of the mitral valve leaflets. Mild to moderate mitral annular calcification. No evidence of mitral valve regurgitation. No evidence of mitral valve stenosis. Tricuspid Valve: The tricuspid valve  is not well visualized. Tricuspid valve regurgitation is not demonstrated. No evidence of tricuspid stenosis. Aortic Valve: The aortic valve is abnormal. There is moderate aortic valve annular calcification. Aortic valve regurgitation is not visualized. No aortic stenosis is present. Aortic valve mean gradient measures 2.0 mmHg. Aortic valve peak gradient  measures 3.9 mmHg. Aortic valve area, by VTI measures 2.01 cm. Pulmonic Valve: The pulmonic valve was not well visualized. Pulmonic valve regurgitation is not visualized. No evidence of pulmonic stenosis. Aorta: The aortic root is normal in size and structure. Venous: The inferior vena cava is normal in size with greater than 50% respiratory variability, suggesting right atrial pressure of 3 mmHg. IAS/Shunts: No atrial level shunt detected by color flow Doppler.  LEFT VENTRICLE PLAX 2D LVIDd:         4.20 cm   Diastology LVIDs:         2.70 cm   LV e' medial:    4.90 cm/s LV PW:         1.20 cm   LV E/e' medial:  15.9 LV IVS:        1.30 cm   LV e' lateral:   6.96 cm/s LVOT diam:     1.80 cm   LV E/e' lateral: 11.2 LV SV:         48 LV SV Index:   31 LVOT Area:     2.54 cm  RIGHT VENTRICLE             IVC RV Basal diam:  2.50 cm     IVC diam: 2.00 cm RV S prime:     14.00 cm/s TAPSE (M-mode): 2.3 cm LEFT ATRIUM           Index        RIGHT ATRIUM          Index LA diam:      2.30 cm 1.49 cm/m   RA Area:     6.93 cm LA Vol (A2C): 17.1 ml 11.06 ml/m  RA Volume:   12.10 ml 7.83 ml/m LA Vol (A4C): 15.1 ml 9.77 ml/m  AORTIC VALVE AV Area (Vmax):    2.16 cm AV Area (Vmean):   2.00 cm AV Area (VTI):     2.01 cm AV Vmax:           98.20 cm/s AV Vmean:          69.300 cm/s AV VTI:            0.238 m AV Peak Grad:      3.9 mmHg AV Mean Grad:      2.0 mmHg LVOT Vmax:         83.30 cm/s LVOT Vmean:        54.500 cm/s LVOT VTI:          0.188 m LVOT/AV VTI ratio: 0.79  AORTA Ao Root diam: 2.80 cm Ao Asc diam:  3.20 cm MITRAL VALVE MV Area (PHT): 3.54 cm    SHUNTS MV Decel Time: 214 msec    Systemic VTI:  0.19 m MV E velocity: 78.00 cm/s  Systemic Diam: 1.80 cm MV A velocity: 99.30 cm/s MV E/A ratio:  0.79 Vishnu Priya Mallipeddi Electronically signed by Lorelee Cover Mallipeddi Signature Date/Time: 06/06/2022/2:49:10 PM    Final      Patient Profile     88 y.o. female with past medical history of hypertension,  hyperlipidemia, prior CVA, chronic stage IIIb kidney disease, former tobacco use admitted with non-ST  elevation myocardial infarction.  Echocardiogram shows normal LV function, mild left ventricular hypertrophy, grade 1 diastolic dysfunction.  Assessment & Plan    1 non-ST elevation myocardial infarction-troponin increased from admission.  Recurrent symptoms earlier today now resolved.  Plan is for cardiac catheterization.  The risk and benefits including myocardial infarction, CVA, death and worsening renal function discussed and she agrees to proceed.  Continue aspirin, heparin and statin.  Beta-blocker not added due to baseline bradycardia.  2 chronic stage IIIb kidney disease-I discussed the possibility of worsening renal function following catheterization.  We will limit dye.  Note echocardiogram showed preserved LV function.  No ventriculogram.  Recheck potassium and renal function tomorrow morning.  3 hypertension-continue present medications and follow blood pressure.  4 hyperlipidemia-continue statin.  For questions or updates, please contact [place] Please consult [email or web address] for contact info under        Signed, Kirk Ruths, MD  06/08/2022, 10:28 AM

## 2022-06-08 NOTE — Progress Notes (Addendum)
Spoke with Dr. Claiborne Billings regarding pt's c/o pain to teeth and hands, pt oriented to self and date, re-oriented to time and place, no new orders at this time, support given, see MAR, [place] remains in place, safety maintained

## 2022-06-08 NOTE — Progress Notes (Signed)
ANTICOAGULATION CONSULT NOTE   Pharmacy Consult for heparin Indication: chest pain/ACS  Allergies  Allergen Reactions   Actonel [Risedronate] Nausea And Vomiting   Fosamax [Alendronate] Nausea And Vomiting   Miacalcin Other (See Comments)    Unknown reaction   Neomycin Other (See Comments)    Unknown reaction   Neosporin [Neomycin-Polymyxin-Gramicidin] Swelling   Neurontin [Gabapentin] Other (See Comments)    Confusion   Codeine Palpitations    Patient Measurements: Height: '4\' 11"'$  (149.9 cm) Weight: 59.5 kg (131 lb 3.2 oz) IBW/kg (Calculated) : 43.2 HEPARIN DW (KG): 56.5   Vital Signs: Temp: 98 F (36.7 C) (11/13 0400) Temp Source: Oral (11/13 0400) BP: 160/62 (11/13 0400) Pulse Rate: 86 (11/13 0400)  Labs: Recent Labs    06/06/22 0024 06/06/22 0403 06/06/22 0644 06/06/22 1447 06/06/22 1809 06/06/22 1953 06/07/22 0027 06/07/22 0034 06/08/22 0033  HGB 11.9*  --   --   --   --   --  11.3*  --  12.4  HCT 36.3  --   --   --   --   --  33.3*  --  38.1  PLT 235  --   --   --   --   --  205  --  240  HEPARINUNFRC  --   --    < > 0.49  --   --  0.50  --  0.38  CREATININE 1.74*  --   --   --   --   --   --  1.68* 1.76*  TROPONINIHS 763* 633*  --   --  877* 749*  --   --   --    < > = values in this interval not displayed.     Estimated Creatinine Clearance: 17.3 mL/min (A) (by C-G formula based on SCr of 1.76 mg/dL (H)).   Medical History: Past Medical History:  Diagnosis Date   CKD (chronic kidney disease), stage III (HCC)    Family history of breast cancer    mother   Family history of colon cancer    father   Glaucoma    Osteopenia    SBO (small bowel obstruction) (HCC)     Medications:  Medications Prior to Admission  Medication Sig Dispense Refill Last Dose   acetaminophen (TYLENOL) 500 MG tablet Take 1,000 mg by mouth every 6 (six) hours as needed.   Unk   amLODipine (NORVASC) 2.5 MG tablet Take 2.5 mg by mouth daily.   06/04/2022   aspirin EC  81 MG tablet Take 1 tablet (81 mg total) by mouth daily. Swallow whole. 30 tablet 12 06/04/2022   atorvastatin (LIPITOR) 40 MG tablet Take 1 tablet (40 mg total) by mouth daily. 30 tablet 11 06/04/2022   brimonidine (ALPHAGAN) 0.2 % ophthalmic solution Place 1 drop into both eyes 3 (three) times daily.   Unk   dorzolamide-timolol (COSOPT) 22.3-6.8 MG/ML ophthalmic solution 1 drop 2 (two) times daily.     06/05/2022   latanoprost (XALATAN) 0.005 % ophthalmic solution Place 1 drop into both eyes every evening.   06/04/2022   OPTIVE 0.5-0.9 % ophthalmic solution Place 1 drop into both eyes 4 (four) times daily as needed for dry eyes.   06/04/2022   RHOPRESSA 0.02 % SOLN Place 1 drop into both eyes every evening.   06/04/2022   Travoprost, BAK Free, (TRAVATAMN) 0.004 % SOLN ophthalmic solution Place 1 drop into both eyes at bedtime.    06/04/2022   diclofenac Sodium (VOLTAREN ARTHRITIS  PAIN) 1 % GEL Apply 4 g topically 4 (four) times daily. (Patient not taking: Reported on 06/05/2022) 100 g 0 Not Taking   Scheduled:   amLODipine  2.5 mg Oral Daily   aspirin EC  81 mg Oral Daily   atorvastatin  40 mg Oral Daily   dorzolamide-timolol  1 drop Both Eyes BID   latanoprost  1 drop Both Eyes QHS   pantoprazole  40 mg Oral Daily    Assessment: 88 yo female with elevated troponin to start heparin for r/o ACS, treating as NSTEMI. No anticoagulants noted PTA.   Heparin level 0.38, remains therapeutic.  Hgb 12.4 and PLT 240 stable.  No issues with infusion or bleeding noted.   Goal of Therapy:  Heparin level 0.3-0.7 units/ml Monitor platelets by anticoagulation protocol: Yes   Plan:  Continue heparin at 700 units/hour Daily heparin level and CBC  Monitor for s/sx bleeding  F/u LE dopplers F/u anticoag plans s/p LHC today, 06/08/22   Laurie [name], PharmD, BCPS Clinical Pharmacist 06/08/2022 7:51 AM   Please refer to AMION for pharmacy phone number

## 2022-06-08 NOTE — Progress Notes (Signed)
SITE AREA: right groin/femoral  SITE PRIOR TO REMOVAL:  LEVEL 0  PRESSURE APPLIED FOR: approximately 20 minutes  MANUAL: yes  PATIENT STATUS DURING PULL: pt with c/o nausea, small amount of brownish, thick mucous like substance suctioned from pt's mouth and cleaned from pt's chin per Mickel Baas, RN  POST PULL SITE:  LEVEL 0  POST PULL INSTRUCTIONS GIVEN: yes  POST PULL PULSES PRESENT: bilateral pedal pulses at +2  DRESSING APPLIED: gauze with tegaderm   BEDREST BEGINS @ 1818  COMMENTS:

## 2022-06-08 NOTE — Progress Notes (Signed)
Pt right groin gauze saturated.  No hematoma present, manual pressure held for 20 minutes with hemostasis achieved.  2+ right dpp.  Family at bedside.

## 2022-06-09 ENCOUNTER — Other Ambulatory Visit (HOSPITAL_COMMUNITY): Payer: Self-pay

## 2022-06-09 ENCOUNTER — Encounter (HOSPITAL_COMMUNITY): Payer: Medicare Other

## 2022-06-09 ENCOUNTER — Encounter (HOSPITAL_COMMUNITY): Payer: Self-pay | Admitting: Cardiovascular Disease

## 2022-06-09 DIAGNOSIS — K449 Diaphragmatic hernia without obstruction or gangrene: Secondary | ICD-10-CM

## 2022-06-09 LAB — BASIC METABOLIC PANEL
Anion gap: 6 (ref 5–15)
BUN: 20 mg/dL (ref 8–23)
CO2: 21 mmol/L — ABNORMAL LOW (ref 22–32)
Calcium: 8.7 mg/dL — ABNORMAL LOW (ref 8.9–10.3)
Chloride: 113 mmol/L — ABNORMAL HIGH (ref 98–111)
Creatinine, Ser: 1.6 mg/dL — ABNORMAL HIGH (ref 0.44–1.00)
GFR, Estimated: 31 mL/min — ABNORMAL LOW (ref >60)
Glucose, Bld: 106 mg/dL — ABNORMAL HIGH (ref 70–99)
Potassium: 4 mmol/L (ref 3.5–5.1)
Sodium: 140 mmol/L (ref 135–145)

## 2022-06-09 LAB — CBC
HCT: 36 % (ref 36.0–46.0)
Hemoglobin: 12.2 g/dL (ref 12.0–15.0)
MCH: 32 pg (ref 26.0–34.0)
MCHC: 33.9 g/dL (ref 30.0–36.0)
MCV: 94.5 fL (ref 80.0–100.0)
Platelets: 223 10*3/uL (ref 150–400)
RBC: 3.81 MIL/uL — ABNORMAL LOW (ref 3.87–5.11)
RDW: 13.2 % (ref 11.5–15.5)
WBC: 10.4 10*3/uL (ref 4.0–10.5)
nRBC: 0 % (ref 0.0–0.2)

## 2022-06-09 MED ORDER — TICAGRELOR 90 MG PO TABS
90.0000 mg | ORAL_TABLET | Freq: Two times a day (BID) | ORAL | 2 refills | Status: DC
  Filled 2022-06-09: qty 60, 30d supply, fill #0

## 2022-06-09 MED ORDER — METOPROLOL TARTRATE 12.5 MG HALF TABLET
12.5000 mg | ORAL_TABLET | Freq: Two times a day (BID) | ORAL | Status: DC
  Administered 2022-06-09: 12.5 mg via ORAL
  Filled 2022-06-09: qty 1

## 2022-06-09 MED ORDER — PANTOPRAZOLE SODIUM 40 MG PO TBEC
40.0000 mg | DELAYED_RELEASE_TABLET | Freq: Every day | ORAL | 0 refills | Status: DC
  Filled 2022-06-09: qty 30, 30d supply, fill #0

## 2022-06-09 MED ORDER — METOPROLOL TARTRATE 25 MG PO TABS
12.5000 mg | ORAL_TABLET | Freq: Two times a day (BID) | ORAL | 2 refills | Status: DC
  Filled 2022-06-09: qty 60, 60d supply, fill #0

## 2022-06-09 MED ORDER — ORAL CARE MOUTH RINSE: 15.0000 mL | OROMUCOSAL | Status: DC | PRN

## 2022-06-09 MED ORDER — NITROGLYCERIN 0.4 MG SL SUBL
0.4000 mg | SUBLINGUAL_TABLET | SUBLINGUAL | 12 refills | Status: AC | PRN
  Filled 2022-06-09: qty 25, 14d supply, fill #0

## 2022-06-09 MED FILL — Verapamil HCl IV Soln 2.5 MG/ML: INTRAVENOUS | Qty: 2 | Status: AC

## 2022-06-09 NOTE — Progress Notes (Signed)
CARDIAC REHAB PHASE I   PRE:  Rate/Rhythm: 91 NSR  BP:  Sitting: 118/59      SaO2: 98 RA  MODE:  Ambulation: 45 ft   POST:  Rate/Rhythm: 108 ST  BP:  Sitting: 136/70      SaO2: 97 RA  Pt ambulated 22f with gait belt and RW. Pt needed mod-max assistance standing up and transferring to RW. Pt gait is slow and steady with RW, RPE 9, and pt was returned to EOB and educated with family present. Pt was educated on stent card, stent location, wt restrictions, no baths/ daily wash-ups, S/S of infection, ex guidelines (walking as able) S/S to stop exercising,  NTG use and calling 911, heart healthy diet, and CRPII. Pt received MI book, exercise, diet, and CRPII materials. Pt referred to [place] JChristen Bame 9:58 AM 06/09/2022

## 2022-06-09 NOTE — Evaluation (Signed)
Physical Therapy Evaluation Patient Details Name: Laurie [name] MRN: [id number] DOB: 07/28/1931 Today's Date: 06/09/2022  History of Present Illness  Pt is a 88 y.o. F who presents 06/05/2022 with NSTEMI now s/p PCI of right coronary artery. Significant PMH: HTN, HLD, prior CVA, chronic stage IIIb kidney disease, former tobacco use.  Clinical Impression  PTA, pt lives with her son and is independent with mobility using a quad cane. Denies recent history of falls. Pt presents with impaired static/dynamic balance and decreased cardiopulmonary endurance. Ambulating 80 ft with no assistive device at a min guard-min assist level. HR 88-97 bpm. Pt also noted to have abrasion to left upper back; RN notified and present to observe. Recommended use of quad cane for all mobility and increased supervision from family members initially with mobility. Pt declining HHPT follow up. Will continue to follow acutely.     Recommendations for follow up therapy are one component of a multi-disciplinary discharge planning process, led by the attending physician.  Recommendations may be updated based on patient status, additional functional criteria and insurance authorization.  Follow Up Recommendations No PT follow up (pt declining follow up)      Assistance Recommended at Discharge PRN (more frequent supervision initially)  Patient can return home with the following  A little help with walking and/or transfers;A little help with bathing/dressing/bathroom;Assistance with cooking/housework;Assist for transportation;Help with stairs or ramp for entrance    Equipment Recommendations None recommended by PT  Recommendations for Other Services       Functional Status Assessment Patient has had a recent decline in their functional status and demonstrates the ability to make significant improvements in function in a reasonable and predictable amount of time.     Precautions / Restrictions  Precautions Precautions: Fall Restrictions Weight Bearing Restrictions: No      Mobility  Bed Mobility Overal bed mobility: Modified Independent                  Transfers Overall transfer level: Needs assistance Equipment used: None Transfers: Sit to/from Stand Sit to Stand: Min guard           General transfer comment: min guard for safety    Ambulation/Gait Ambulation/Gait assistance: Min guard, Min assist Gait Distance (Feet): 80 Feet Assistive device: 1 person hand held assist Gait Pattern/deviations: Step-through pattern, Decreased stride length, Shuffle Gait velocity: decreased Gait velocity interpretation: <1.8 ft/sec, indicate of risk for recurrent falls   General Gait Details: Shuffling steps, utilizing HHA, one episode of posterior LOB requiring minA to correct  Stairs            Wheelchair Mobility    Modified Rankin (Stroke Patients Only)       Balance Overall balance assessment: Mild deficits observed, not formally tested                                           Pertinent Vitals/Pain Pain Assessment Pain Assessment: No/denies pain    Home Living Family/patient expects to be discharged to:: Private residence Living Arrangements: Children Available Help at Discharge: Family;Available 24 hours/day Type of Home: House Home Access: Stairs to enter   [employer] of Steps: 2   Home Layout: Two level;Able to live on main level with bedroom/bathroom Home Equipment: Rollator (4 wheels);Cane - quad Additional Comments: family members alternating times taking care of pt    Prior Function Prior  Level of Function : Independent/Modified Independent             Mobility Comments: uses QC intermittently       Hand Dominance   Dominant Hand: Right    Extremity/Trunk Assessment   Upper Extremity Assessment Upper Extremity Assessment: RUE deficits/detail;LUE deficits/detail RUE Deficits / Details:  Shoulder flexion AROM WFL LUE Deficits / Details: Shoulder flexion AROM WFL    Lower Extremity Assessment Lower Extremity Assessment: RLE deficits/detail;LLE deficits/detail RLE Deficits / Details: Grossly 4/5 LLE Deficits / Details: Grossly 4/5    Cervical / Trunk Assessment Cervical / Trunk Assessment: Kyphotic  Communication   Communication: No difficulties  Cognition Arousal/Alertness: Awake/alert Behavior During Therapy: WFL for tasks assessed/performed Overall Cognitive Status: Within Functional Limits for tasks assessed                                          General Comments      Exercises     Assessment/Plan    PT Assessment Patient needs continued PT services  PT Problem List Decreased strength;Decreased activity tolerance;Decreased balance;Decreased mobility       PT Treatment Interventions DME instruction;Gait training;Stair training;Functional mobility training;Therapeutic activities;Therapeutic exercise;Balance training;Patient/family education    PT Goals (Current goals can be found in the Care Plan section)  Acute Rehab PT Goals Patient Stated Goal: to go home PT Goal Formulation: With patient/family Time For Goal Achievement: 06/23/22 Potential to Achieve Goals: Good    Frequency Min 3X/week     Co-evaluation               AM-PAC PT "6 Clicks" Mobility  Outcome Measure Help needed turning from your back to your side while in a flat bed without using bedrails?: None Help needed moving from lying on your back to sitting on the side of a flat bed without using bedrails?: None Help needed moving to and from a bed to a chair (including a wheelchair)?: A Little Help needed standing up from a chair using your arms (e.g., wheelchair or bedside chair)?: A Little Help needed to walk in hospital room?: A Little Help needed climbing 3-5 steps with a railing? : A Little 6 Click Score: 20    End of Session   Activity Tolerance:  Patient tolerated treatment well Patient left: in chair;with call bell/phone within reach;with family/visitor present Nurse Communication: Mobility status;Other (comment) (abrasian on L upper back) PT Visit Diagnosis: Unsteadiness on feet (R26.81)    Time: 7673-4193 PT Time Calculation (min) (ACUTE ONLY): 19 min   Charges:   PT Evaluation $PT Eval Low Complexity: Gang Mills, PT, DPT Acute Rehabilitation Services Office [phone]   Deno Etienne 06/09/2022, 9:15 AM

## 2022-06-09 NOTE — Progress Notes (Signed)
Rounding Note    Patient Name: Laurie [name] Date of Encounter: 06/09/2022  [place] Cardiologist: Dr Myles Gip  Subjective   No CP or dyspnea  Inpatient Medications    Scheduled Meds:  amLODipine  2.5 mg Oral Daily   aspirin EC  81 mg Oral Daily   atorvastatin  40 mg Oral Daily   dorzolamide-timolol  1 drop Both Eyes BID   latanoprost  1 drop Both Eyes QHS   pantoprazole  40 mg Oral Daily   sodium chloride flush  3 mL Intravenous Q12H   ticagrelor  90 mg Oral BID   Continuous Infusions:  sodium chloride     PRN Meds: sodium chloride, acetaminophen, acetaminophen, carboxymethylcellul-glycerin, diazepam, nitroGLYCERIN, ondansetron (ZOFRAN) IV, sodium chloride flush   Vital Signs    Vitals:   06/08/22 1921 06/08/22 2000 06/09/22 0000 06/09/22 0600  BP: (!) 132/59 126/63 124/60 (!) 122/57  Pulse: 80 80 79 80  Resp: '20 16 16 16  '$ Temp:   97.7 F (36.5 C) 97.7 F (36.5 C)  TempSrc:   Oral Oral  SpO2: 97% 98% 97% 98%  Weight:      Height:        Intake/Output Summary (Last 24 hours) at 06/09/2022 0724 Last data filed at 06/08/2022 1758 Gross per 24 hour  Intake 945.22 ml  Output --  Net 945.22 ml      06/08/2022   12:19 AM 06/07/2022    5:25 AM 06/06/2022    5:23 AM  Last 3 Weights  Weight (lbs) 131 lb 3.2 oz 131 lb 1.6 oz 132 lb 1.6 oz  Weight (kg) 59.512 kg 59.467 kg 59.92 kg      Telemetry    Sinus - Personally Reviewed   Physical Exam   GEN: No acute distress.   Neck: No JVD Cardiac: RRR, no murmurs, rubs, or gallops.  Respiratory: Clear to auscultation bilaterally. GI: Soft, nontender, non-distended; right groin with no hematoma and no bruit MS: No edema Neuro:  Nonfocal  Psych: Normal affect   Labs    High Sensitivity Troponin:   Recent Labs  Lab 06/05/22 2019 06/06/22 0024 06/06/22 0403 06/06/22 1809 06/06/22 1953  TROPONINIHS 796* [place]*     Chemistry Recent Labs  Lab 06/05/22 0309  06/05/22 0310 06/06/22 0024 06/07/22 0034 06/08/22 0033 06/09/22 0209  NA 138  --    < > 138 139 140  K 3.9  --    < > 4.3 4.3 4.0  CL 107  --    < > 108 111 113*  CO2 20*  --    < > 25 20* 21*  GLUCOSE 116*  --    < > 112* 113* 106*  BUN 34*  --    < > 23 26* 20  CREATININE 1.65*  --    < > 1.68* 1.76* 1.60*  CALCIUM 8.9  --    < > 8.6* 8.8* 8.7*  MG 2.0  --   --   --   --   --   PROT  --  7.0  --   --   --   --   ALBUMIN  --  3.9  --   --   --   --   AST  --  17  --   --   --   --   ALT  --  11  --   --   --   --  ALKPHOS  --  60  --   --   --   --   BILITOT  --  0.5  --   --   --   --   GFRNONAA 30*  --    < > 29* 27* 31*  ANIONGAP 11  --    < > '5 8 6   '$ < > = values in this interval not displayed.     Hematology Recent Labs  Lab 06/07/22 0027 06/08/22 0033 06/09/22 0209  WBC 8.0 9.5 10.4  RBC 3.48* 3.93 3.81*  HGB 11.3* 12.4 12.2  HCT 33.3* 38.1 36.0  MCV 95.7 96.9 94.5  MCH 32.5 31.6 32.0  MCHC 33.9 32.5 33.9  RDW 13.2 13.1 13.2  PLT 205 240 223    BNP Recent Labs  Lab 06/05/22 0309  BNP 95.7    DDimer  Recent Labs  Lab 06/05/22 0602  DDIMER 2.90*     Radiology    CARDIAC CATHETERIZATION  Result Date: 06/08/2022   Mid RCA to Dist RCA lesion is 40% stenosed.   Dist RCA lesion is 99% stenosed.   Prox RCA-1 lesion is 50% stenosed.   Prox RCA-2 lesion is 50% stenosed.   Prox LAD lesion is 20% stenosed.   A drug-eluting stent was successfully placed.   Post intervention, there is a 0% residual stenosis.   Post intervention, there is a 0% residual stenosis. NSTEMI secondary to subtotal 99% stenosis of the RCA after the acute margin proximal to the PDA takeoff in a dominant RCA vessel.  There are segmental 50% proximal stenosis on proximal bend of the RCA vessel. Mildly calcified proximal LAD with 20% narrowing. Normal but tortuous left circumflex coronary artery LVEDP 7 mmHg Difficult but successful percutaneous coronary convention utilizing a telescope  guide extension catheter with ultimate PCI and DES stenting insertion of a 3.5 x 22 mm Onyx frontier stent postdilated to 3.75 mm with the distal stenoses being reduced to 0% and brisk TIMI-3 flow. RECOMMENDATION: DAPT for minimum of 12 months.  Initial medical therapy for concomitant CAD.  Aggressive lipid-lowering therapy with target LDL less than 70.     Patient Profile     88 y.o. female with past medical history of hypertension, hyperlipidemia, prior CVA, chronic stage IIIb kidney disease, former tobacco use admitted with non-ST elevation myocardial infarction. Echocardiogram shows normal LV function, mild left ventricular hypertrophy, grade 1 diastolic dysfunction.  Cardiac catheterization revealed 99% distal RCA lesion and otherwise nonobstructive disease.  Patient had PCI of the right coronary artery with drug-eluting stent.  Assessment & Plan    1 non-ST elevation myocardial infarction-patient is status post PCI of the right coronary artery.  Continue aspirin, Brilinta and statin. Add metoprolol 12.5 mg BID. Note LV function normal on echocardiogram.  2 chronic stage IIIb kidney disease-creatinine is 1.6 this morning and unchanged.  Would plan to recheck renal function on Thursday of this week.  3 hypertension-patient's blood pressure has improved.  Continue present medical regimen.  4 hyperlipidemia-continue statin.  Patient can be discharged from a cardiac standpoint.  We will arrange follow-up with APP in 2 weeks.  Continue present medications at discharge.  Please call with questions.  For questions or updates, please contact [place] Please consult [email or web address] for contact info under        Signed, Kirk Ruths, MD  06/09/2022, 7:24 AM

## 2022-06-09 NOTE — Care Management Important Message (Signed)
Important Message  Patient Details  Name: Laurie [name] MRN: [id number] Date of Birth: 29-Jul-1931   Medicare Important Message Given:  Yes     Shelda Altes 06/09/2022, 12:12 PM

## 2022-06-10 LAB — LIPOPROTEIN A (LPA): Lipoprotein (a): 57.8 nmol/L — ABNORMAL HIGH (ref <75.0)

## 2022-06-10 NOTE — Discharge Summary (Signed)
Physician Discharge Summary   Patient: Laurie [name] MRN: [id number] DOB: 20-Jun-1932  Admit date:     06/05/2022  Discharge date: 06/09/22  Discharge Physician: Hosie Poisson   PCP: Lawerance Cruel, MD   Recommendations at discharge:  Please follow up with PCP in one week.  Please follow up with cardiology as recommended.   Discharge Diagnoses: Principal Problem:   Elevated troponin Active Problems:   Uncontrolled hypertension   Hiatal hernia   Elevated d-dimer   Abnormal urinalysis   HLD (hyperlipidemia)   History of stroke   Chronic kidney disease, stage 3b (HCC)   Non-ST elevation (NSTEMI) myocardial infarction [facility])    Hospital Course: MEGGAN [name] is an 88 y.o. female past medical history significant for essential hypertension, chronic kidney stage IIIb, history of small bowel obstruction with a left hemicolectomy comes in with sudden onset of nausea neck and jaw pain CTA of the chest was negative for PE but did show a hiatal hernia, there was a rise in troponins cardiology was consulted started on IV heparin and aspirin, for NSTEMI.   Assessment and Plan: NSTEMI: S/p PCI, in RCA. Continue with aspirin , brillinta and statin,  Echocardiogram reviewed.    Essential hypertension: Continue amlodipine blood pressure stable.   Hiatal hernia: Half of the stomach seen in thorax.  Does not warrant surgery, will likely benefit for surgical consultation as an outpatient. Patient is symptomatic continue PPI.   Hyperlipidemia: Continue statins.   Abnormal UA: Urine culture has been ordered.patient denies any symptoms. No indication of antibiotics.    History of CVA: Continue aspirin and statins.   Chronic kidney disease stage IIIb: Creatinine at baseline      Consultants: cardiology.  Procedures performed: cath today.   Disposition: Home Diet recommendation:  Discharge Diet Orders (From admission, onward)     Start     Ordered   06/09/22 0000   Diet - low sodium heart healthy        06/09/22 1115           Cardiac diet DISCHARGE MEDICATION: Allergies as of 06/09/2022       Reactions   Actonel [risedronate] Nausea And Vomiting   Fosamax [alendronate] Nausea And Vomiting   Miacalcin Other (See Comments)   Unknown reaction   Neomycin Other (See Comments)   Unknown reaction   Neosporin [neomycin-polymyxin-gramicidin] Swelling   Neurontin [gabapentin] Other (See Comments)   Confusion   Codeine Palpitations        Medication List     STOP taking these medications    diclofenac Sodium 1 % Gel Commonly known as: Voltaren Arthritis Pain       TAKE these medications    acetaminophen 500 MG tablet Commonly known as: TYLENOL Take 1,000 mg by mouth every 6 (six) hours as needed.   amLODipine 2.5 MG tablet Commonly known as: NORVASC Take 2.5 mg by mouth daily.   aspirin EC 81 MG tablet Take 1 tablet (81 mg total) by mouth daily. Swallow whole.   atorvastatin 40 MG tablet Commonly known as: LIPITOR Take 1 tablet (40 mg total) by mouth daily.   Brilinta 90 MG Tabs tablet Generic drug: ticagrelor Take 1 tablet (90 mg total) by mouth 2 (two) times daily.   brimonidine 0.2 % ophthalmic solution Commonly known as: ALPHAGAN Place 1 drop into both eyes 3 (three) times daily.   dorzolamide-timolol 2-0.5 % ophthalmic solution Commonly known as: COSOPT 1 drop 2 (two) times daily.   latanoprost  0.005 % ophthalmic solution Commonly known as: XALATAN Place 1 drop into both eyes every evening.   metoprolol tartrate 25 MG tablet Commonly known as: LOPRESSOR Take 0.5 tablets (12.5 mg total) by mouth 2 (two) times daily.   nitroGLYCERIN 0.4 MG SL tablet Commonly known as: NITROSTAT Place 1 tablet (0.4 mg total) under the tongue every 5 (five) minutes as needed for chest pain.   OPTIVE 0.5-0.9 % ophthalmic solution Generic drug: carboxymethylcellul-glycerin Place 1 drop into both eyes 4 (four) times daily as  needed for dry eyes.   pantoprazole 40 MG tablet Commonly known as: PROTONIX Take 1 tablet (40 mg total) by mouth daily.   Rhopressa 0.02 % Soln Generic drug: Netarsudil Dimesylate Place 1 drop into both eyes every evening.   Travoprost (BAK Free) 0.004 % Soln ophthalmic solution Commonly known as: TRAVATAN Place 1 drop into both eyes at bedtime.        Discharge Exam: Filed Weights   06/06/22 0523 06/07/22 0525 06/08/22 0019  Weight: 59.9 kg 59.5 kg 59.5 kg   General exam: Appears calm and comfortable  Respiratory system: Clear to auscultation. Respiratory effort normal. Cardiovascular system: S1 & S2 heard, RRR. No JVD,  Gastrointestinal system: Abdomen is nondistended, soft and nontender.  Central nervous system: Alert and oriented. No focal neurological deficits. Extremities: Symmetric 5 x 5 power. Skin: No rashes, lesions or ulcers Psychiatry:  Mood & affect appropriate.    Condition at discharge: fair  The results of significant diagnostics from this hospitalization (including imaging, microbiology, ancillary and laboratory) are listed below for reference.   Imaging Studies: CARDIAC CATHETERIZATION  Result Date: 06/08/2022   Mid RCA to Dist RCA lesion is 40% stenosed.   Dist RCA lesion is 99% stenosed.   Prox RCA-1 lesion is 50% stenosed.   Prox RCA-2 lesion is 50% stenosed.   Prox LAD lesion is 20% stenosed.   A drug-eluting stent was successfully placed.   Post intervention, there is a 0% residual stenosis.   Post intervention, there is a 0% residual stenosis. NSTEMI secondary to subtotal 99% stenosis of the RCA after the acute margin proximal to the PDA takeoff in a dominant RCA vessel.  There are segmental 50% proximal stenosis on proximal bend of the RCA vessel. Mildly calcified proximal LAD with 20% narrowing. Normal but tortuous left circumflex coronary artery LVEDP 7 mmHg Difficult but successful percutaneous coronary convention utilizing a telescope guide  extension catheter with ultimate PCI and DES stenting insertion of a 3.5 x 22 mm Onyx frontier stent postdilated to 3.75 mm with the distal stenoses being reduced to 0% and brisk TIMI-3 flow. RECOMMENDATION: DAPT for minimum of 12 months.  Initial medical therapy for concomitant CAD.  Aggressive lipid-lowering therapy with target LDL less than 70.   ECHOCARDIOGRAM COMPLETE  Result Date: 06/06/2022    ECHOCARDIOGRAM REPORT   Patient Name:   QUINISHA MOULD Date of Exam: 06/06/2022 Medical Rec #:  [id number]          Height:       59.0 in Accession #:    [id number]         Weight:       132.1 lb Date of Birth:  Nov 09, 1931          BSA:          1.546 m Patient Age:    52 years           BP:  113/63 mmHg Patient Gender: F                  HR:           59 bpm. Exam Location:  Inpatient Procedure: 2D Echo, Cardiac Doppler and Color Doppler Indications:    Elevated troponin  History:        Patient has prior history of Echocardiogram examinations, most                 recent 12/29/2021. Risk Factors:Dyslipidemia, Hypertension and                 Former Smoker. Hx stroke. CKD.  Sonographer:    Clayton Lefort RDCS (AE) Referring Phys: Wandra Feinstein RATHORE IMPRESSIONS  1. Left ventricular ejection fraction, by estimation, is 65 to 70%. The left ventricle has normal function. The left ventricle has no regional wall motion abnormalities. There is mild left ventricular hypertrophy. Left ventricular diastolic parameters are consistent with Grade I diastolic dysfunction (impaired relaxation).  2. Right ventricular systolic function is normal. The right ventricular size is normal.  3. The mitral valve is abnormal. No evidence of mitral valve regurgitation. No evidence of mitral stenosis.  4. The aortic valve is abnormal. Aortic valve regurgitation is not visualized. No aortic stenosis is present.  5. The inferior vena cava is normal in size with greater than 50% respiratory variability, suggesting right atrial pressure of  3 mmHg. Comparison(s): No significant change from prior study. FINDINGS  Left Ventricle: Left ventricular ejection fraction, by estimation, is 65 to 70%. The left ventricle has normal function. The left ventricle has no regional wall motion abnormalities. The left ventricular internal cavity size was normal in size. There is  mild left ventricular hypertrophy. Left ventricular diastolic parameters are consistent with Grade I diastolic dysfunction (impaired relaxation). Right Ventricle: The right ventricular size is normal. No increase in right ventricular wall thickness. Right ventricular systolic function is normal. Left Atrium: Left atrial size was normal in size. Right Atrium: Right atrial size was normal in size. Pericardium: There is no evidence of pericardial effusion. Mitral Valve: The mitral valve is abnormal. Normal mobility of the mitral valve leaflets. Mild to moderate mitral annular calcification. No evidence of mitral valve regurgitation. No evidence of mitral valve stenosis. Tricuspid Valve: The tricuspid valve is not well visualized. Tricuspid valve regurgitation is not demonstrated. No evidence of tricuspid stenosis. Aortic Valve: The aortic valve is abnormal. There is moderate aortic valve annular calcification. Aortic valve regurgitation is not visualized. No aortic stenosis is present. Aortic valve mean gradient measures 2.0 mmHg. Aortic valve peak gradient measures 3.9 mmHg. Aortic valve area, by VTI measures 2.01 cm. Pulmonic Valve: The pulmonic valve was not well visualized. Pulmonic valve regurgitation is not visualized. No evidence of pulmonic stenosis. Aorta: The aortic root is normal in size and structure. Venous: The inferior vena cava is normal in size with greater than 50% respiratory variability, suggesting right atrial pressure of 3 mmHg. IAS/Shunts: No atrial level shunt detected by color flow Doppler.  LEFT VENTRICLE PLAX 2D LVIDd:         4.20 cm   Diastology LVIDs:         2.70 cm    LV e' medial:    4.90 cm/s LV PW:         1.20 cm   LV E/e' medial:  15.9 LV IVS:        1.30 cm   LV e' lateral:   6.96  cm/s LVOT diam:     1.80 cm   LV E/e' lateral: 11.2 LV SV:         48 LV SV Index:   31 LVOT Area:     2.54 cm  RIGHT VENTRICLE             IVC RV Basal diam:  2.50 cm     IVC diam: 2.00 cm RV S prime:     14.00 cm/s TAPSE (M-mode): 2.3 cm LEFT ATRIUM           Index        RIGHT ATRIUM          Index LA diam:      2.30 cm 1.49 cm/m   RA Area:     6.93 cm LA Vol (A2C): 17.1 ml 11.06 ml/m  RA Volume:   12.10 ml 7.83 ml/m LA Vol (A4C): 15.1 ml 9.77 ml/m  AORTIC VALVE AV Area (Vmax):    2.16 cm AV Area (Vmean):   2.00 cm AV Area (VTI):     2.01 cm AV Vmax:           98.20 cm/s AV Vmean:          69.300 cm/s AV VTI:            0.238 m AV Peak Grad:      3.9 mmHg AV Mean Grad:      2.0 mmHg LVOT Vmax:         83.30 cm/s LVOT Vmean:        54.500 cm/s LVOT VTI:          0.188 m LVOT/AV VTI ratio: 0.79  AORTA Ao Root diam: 2.80 cm Ao Asc diam:  3.20 cm MITRAL VALVE MV Area (PHT): 3.54 cm    SHUNTS MV Decel Time: 214 msec    Systemic VTI:  0.19 m MV E velocity: 78.00 cm/s  Systemic Diam: 1.80 cm MV A velocity: 99.30 cm/s MV E/A ratio:  0.79 Vishnu Priya Mallipeddi Electronically signed by Lorelee Cover Mallipeddi Signature Date/Time: 06/06/2022/2:49:10 PM    Final    CT Angio Chest/Abd/Pel for Dissection W and/or Wo Contrast  Result Date: 06/05/2022 CLINICAL DATA:  88 year old female with pain, nausea, weakness, near syncope. EXAM: CT ANGIOGRAPHY CHEST, ABDOMEN AND PELVIS TECHNIQUE: Non-contrast CT of the chest was initially obtained. Multidetector CT imaging through the chest, abdomen and pelvis was performed using the standard protocol during bolus administration of intravenous contrast. Multiplanar reconstructed images and MIPs were obtained and reviewed to evaluate the vascular anatomy. RADIATION DOSE REDUCTION: This exam was performed according to the departmental dose-optimization  program which includes automated exposure control, adjustment of the mA and/or kV according to patient size and/or use of iterative reconstruction technique. CONTRAST:  33m OMNIPAQUE IOHEXOL 350 MG/ML SOLN COMPARISON:  CT Abdomen and Pelvis 01/27/2011.  No prior chest CT. FINDINGS: CTA CHEST FINDINGS Cardiovascular: Mildly tortuous thoracic aorta with calcified atherosclerosis. Left coronary artery calcified plaque and/or stent on series 5, image 26. Cardiac size remains within normal limits. No pericardial effusion. Negative for thoracic aortic aneurysm or dissection. Central pulmonary arteries also well enhanced and appear to be patent. Mediastinum/Nodes: Moderate to large gastric hiatal hernia has progressed since 2012. Negative for mediastinal mass or lymphadenopathy. Lungs/Pleura: Major airways are patent. Mild lung base atelectasis. No pleural effusion or convincing acute pulmonary inflammation. Musculoskeletal: Mild kyphoscoliosis. Osteopenia. Thoracic spine degeneration. Chronic appearing right anterior 2nd through 7th rib fractures, mostly nondisplaced. No acute  osseous abnormality identified. Review of the MIP images confirms the above findings. CTA ABDOMEN AND PELVIS FINDINGS VASCULAR Extensive Aortoiliac calcified atherosclerosis. Negative for abdominal aortic aneurysm or dissection. Major arterial structures in the abdomen and pelvis remain patent. Proximal femoral arteries are patent with atherosclerosis. Review of the MIP images confirms the above findings. NON-VASCULAR Hepatobiliary: Negative liver and gallbladder. Pancreas: Negative. Spleen: Negative. Adrenals/Urinary Tract: Negative adrenal glands. Symmetric bilateral renal cortical atrophy since 2012. Kidneys appear nonobstructed, symmetrically enhancing. Unremarkable bladder. Occasional pelvic phleboliths. Stomach/Bowel: Somewhat limited bowel detail with this technique. No dilated large or small bowel loops. 50% gastric hiatal hernia has  progressed since 2012. Intra-abdominal stomach and duodenum appear negative. Abundant large bowel retained stool. No bowel inflammation is identified. Appendix is not clearly delineated. No free air or free fluid identified. Lymphatic: No lymphadenopathy identified. Reproductive: Within normal limits. Other: No pelvic free fluid. Musculoskeletal: Chronic spondylolisthesis in the lower lumbar spine. Chronic degeneration, including severe T12-L1 disc and endplate degeneration superimposed on multilevel lower thoracic chronic interbody ankylosis from flowing osteophytes. No acute osseous abnormality identified. Review of the MIP images confirms the above findings. IMPRESSION: 1. Negative for aortic aneurysm or dissection. Positive for Aortic Atherosclerosis (ICD10-I70.0). 2. Progressed chronic gastric hiatal hernia since 2012, with roughly half of the stomach now intrathoracic. 3. No superimposed acute or inflammatory process identified in the chest, abdomen, or pelvis. 4. Osteopenia. Chronic appearing right anterior 2nd through 7th rib fractures. Advanced spine degeneration. Electronically Signed   By: Genevie Ann M.D.   On: 06/05/2022 07:34   DG Chest 2 View  Result Date: 06/05/2022 CLINICAL DATA:  Presyncope. EXAM: CHEST - 2 VIEW COMPARISON:  12/28/2021 FINDINGS: The lungs are clear without focal pneumonia, edema, pneumothorax or pleural effusion. Interstitial markings are diffusely coarsened with chronic features. The cardio pericardial silhouette is enlarged. Small to moderate hiatal hernia evident. Telemetry leads overlie the chest. IMPRESSION: 1. No acute cardiopulmonary findings. 2. Small to moderate hiatal hernia. Electronically Signed   By: Misty Stanley M.D.   On: 06/05/2022 05:29    Microbiology: Results for orders placed or performed during the hospital encounter of 12/28/21  SARS Coronavirus 2 by RT PCR (hospital order, performed in [facility] hospital lab) *cepheid single result test* Anterior  Nasal Swab     Status: None   Collection Time: 12/28/21  6:39 PM   Specimen: Anterior Nasal Swab  Result Value Ref Range Status   SARS Coronavirus 2 by RT PCR NEGATIVE NEGATIVE Final    Comment: (NOTE) SARS-CoV-2 target nucleic acids are NOT DETECTED.  The SARS-CoV-2 RNA is generally detectable in upper and lower respiratory specimens during the acute phase of infection. The lowest concentration of SARS-CoV-2 viral copies this assay can detect is 250 copies / mL. A negative result does not preclude SARS-CoV-2 infection and should not be used as the sole basis for treatment or other patient management decisions.  A negative result may occur with improper specimen collection / handling, submission of specimen other than nasopharyngeal swab, presence of viral mutation(s) within the areas targeted by this assay, and inadequate number of viral copies (<250 copies / mL). A negative result must be combined with clinical observations, patient history, and epidemiological information.  Fact Sheet for Patients:   https://www.patel.info/  Fact Sheet for Healthcare Providers: https://hall.com/  This test is not yet approved or  cleared by the [place] FDA and has been authorized for detection and/or diagnosis of SARS-CoV-2 by FDA under an Emergency Use Authorization (EUA).  This EUA will remain in effect (meaning this test can be used) for the duration of the COVID-19 declaration under Section 564(b)(1) of the Act, 21 U.S.C. section 360bbb-3(b)(1), unless the authorization is terminated or revoked sooner.  Performed at [facility], [address], [place], [place] [address]   Urine Culture     Status: Abnormal   Collection Time: 12/29/21  4:00 PM   Specimen: Urine, Clean Catch  Result Value Ref Range Status   Specimen Description URINE, CLEAN CATCH  Final   Special Requests   Final    NONE Performed at [facility], [address] [place],  [address]    Culture MULTIPLE SPECIES PRESENT, SUGGEST RECOLLECTION (A)  Final   Report Status 12/30/2021 FINAL  Final    Labs: CBC: Recent Labs  Lab 06/05/22 0309 06/06/22 0024 06/07/22 0027 06/08/22 0033 06/09/22 0209  WBC 11.6* 9.8 8.0 9.5 10.4  HGB 12.4 11.9* 11.3* 12.4 12.2  HCT 37.9 36.3 33.3* 38.1 36.0  MCV 97.7 97.1 95.7 96.9 94.5  PLT 256 235 205 240 975   Basic Metabolic Panel: Recent Labs  Lab 06/05/22 0309 06/06/22 0024 06/07/22 0034 06/08/22 0033 06/09/22 0209  NA 138 139 138 139 140  K 3.9 4.1 4.3 4.3 4.0  CL 107 107 108 111 113*  CO2 20* 23 25 20* 21*  GLUCOSE 116* 115* 112* 113* 106*  BUN 34* 26* 23 26* 20  CREATININE 1.65* 1.74* 1.68* 1.76* 1.60*  CALCIUM 8.9 8.8* 8.6* 8.8* 8.7*  MG 2.0  --   --   --   --    Liver Function Tests: Recent Labs  Lab 06/05/22 0310  AST 17  ALT 11  ALKPHOS 60  BILITOT 0.5  PROT 7.0  ALBUMIN 3.9   CBG: No results for input(s): "GLUCAP" in the last 168 hours.  Discharge time spent: 38 minutes.   Signed: Hosie Poisson, MD [facility] 06/10/2022

## 2022-06-11 ENCOUNTER — Telehealth (HOSPITAL_COMMUNITY): Payer: Self-pay | Admitting: Family Medicine

## 2022-06-16 DIAGNOSIS — I214 Non-ST elevation (NSTEMI) myocardial infarction: Secondary | ICD-10-CM | POA: Diagnosis not present

## 2022-06-16 DIAGNOSIS — Z09 Encounter for follow-up examination after completed treatment for conditions other than malignant neoplasm: Secondary | ICD-10-CM | POA: Diagnosis not present

## 2022-06-16 DIAGNOSIS — E559 Vitamin D deficiency, unspecified: Secondary | ICD-10-CM | POA: Diagnosis not present

## 2022-06-23 NOTE — Progress Notes (Unsigned)
Office Visit    Patient Name: Laurie [name] Date of Encounter: 06/24/2022  Primary Care Provider:  Lawerance Cruel, MD Primary Cardiologist:  Kirk Ruths, MD  Chief Complaint    88 year old female with a history of CAD s/p NSTEMI, DES-RCA in 05/2022, hypertension, hyperlipidemia, CVA, CKD stage IIIb, hiatal hernia and former tobacco use who presents for hospital follow-up related to CAD.  Past Medical History    Past Medical History:  Diagnosis Date   CKD (chronic kidney disease), stage III (HCC)    Family history of breast cancer    mother   Family history of colon cancer    father   Glaucoma    Osteopenia    SBO (small bowel obstruction) ([place])    Past Surgical History:  Procedure Laterality Date   APPENDECTOMY     CESAREAN SECTION     x6   CORONARY STENT INTERVENTION N/A 06/08/2022   Procedure: CORONARY STENT INTERVENTION;  Surgeon: Troy Sine, MD;  Location: [place] [facility];  Service: Cardiovascular;  Laterality: N/A;   ESOPHAGOGASTRODUODENOSCOPY  07/07/2012   Procedure: ESOPHAGOGASTRODUODENOSCOPY (EGD);  Surgeon: Arta Silence, MD;  Location: [facility] ENDOSCOPY;  Service: Endoscopy;  Laterality: N/A;   GLAUCOMA SURGERY Bilateral 02/17/2022   LEFT COLECTOMY  04/30/2011   Sr Streck   LEFT HEART CATH AND CORONARY ANGIOGRAPHY N/A 06/08/2022   Procedure: LEFT HEART CATH AND CORONARY ANGIOGRAPHY;  Surgeon: Troy Sine, MD;  Location: [place] [facility];  Service: Cardiovascular;  Laterality: N/A;   tubal cyst  04/30/2011   L fallopian tube cyst incidentally found    Allergies  Allergies  Allergen Reactions   Actonel [Risedronate] Nausea And Vomiting   Fosamax [Alendronate] Nausea And Vomiting   Miacalcin Other (See Comments)    Unknown reaction   Neomycin Other (See Comments)    Unknown reaction   Neosporin [Neomycin-Polymyxin-Gramicidin] Swelling   Neurontin [Gabapentin] Other (See Comments)    Confusion   Codeine Palpitations    History  of Present Illness    88 year old female with the above past medical history including CAD s/p NSTEMI, DES-RCA in 05/2022, hypertension, hyperlipidemia, CVA, CKD stage IIIb, hiatal hernia, and former tobacco use.   She presented to the ED on 06/05/2022 in the setting of NSTEMI.  She had sudden onset nausea and generalized weakness followed by bilateral upper arm pain.  CTA was negative for PE or dissection.  Troponin peaked at 877.  Cardiology was consulted.  Echocardiogram showed EF 65 to 70%, normal LV function, no RWMA, mild LVH, G1 DD, normal RV systolic function, mild to moderate MAC.  She underwent cardiac catheterization on 06/08/2022 which revealed 99% distal RCA occlusion s/p DES, otherwise nonobstructive CAD.  She was started on aspirin, Brilinta, statin, and metoprolol.  She was discharged home in stable condition on 06/09/2022.  She presents today for follow-up accompanied by her grandson.  Since her hospitalization standpoint.  She denies any symptoms concerning for angina.  She is tolerating her medications.  Overall, she reports feeling well.   Home Medications    Current Outpatient Medications  Medication Sig Dispense Refill   acetaminophen (TYLENOL) 500 MG tablet Take 1,000 mg by mouth every 6 (six) hours as needed.     amLODipine (NORVASC) 2.5 MG tablet Take 2.5 mg by mouth daily.     aspirin EC 81 MG tablet Take 1 tablet (81 mg total) by mouth daily. Swallow whole. 30 tablet 12   atorvastatin (LIPITOR) 40 MG tablet Take  1 tablet (40 mg total) by mouth daily. 30 tablet 11   brimonidine (ALPHAGAN) 0.2 % ophthalmic solution Place 1 drop into both eyes 3 (three) times daily.     dorzolamide-timolol (COSOPT) 22.3-6.8 MG/ML ophthalmic solution 1 drop 2 (two) times daily.       latanoprost (XALATAN) 0.005 % ophthalmic solution Place 1 drop into both eyes every evening.     metoprolol tartrate (LOPRESSOR) 25 MG tablet Take 0.5 tablets (12.5 mg total) by mouth 2 (two) times daily. 60  tablet 2   nitroGLYCERIN (NITROSTAT) 0.4 MG SL tablet Place 1 tablet (0.4 mg total) under the tongue every 5 (five) minutes as needed for chest pain. 30 tablet 12   OPTIVE 0.5-0.9 % ophthalmic solution Place 1 drop into both eyes 4 (four) times daily as needed for dry eyes.     pantoprazole (PROTONIX) 40 MG tablet Take 1 tablet (40 mg total) by mouth daily. 30 tablet 0   RHOPRESSA 0.02 % SOLN Place 1 drop into both eyes every evening.     ticagrelor (BRILINTA) 90 MG TABS tablet Take 1 tablet (90 mg total) by mouth 2 (two) times daily. 60 tablet 2   Travoprost, BAK Free, (TRAVATAMN) 0.004 % SOLN ophthalmic solution Place 1 drop into both eyes at bedtime.      No current facility-administered medications for this visit.     Review of Systems   She denies chest pain, palpitations, dyspnea, pnd, orthopnea, n, v, dizziness, syncope, edema, weight gain, or early satiety. All other systems reviewed and are otherwise negative except as noted above.    Cardiac Rehabilitation Eligibility Assessment  The patient is ready to start cardiac rehabilitation from a cardiac standpoint.    Physical Exam    VS:  BP 133/76   Pulse (!) 54   Ht _0  (1.499 m)   Wt 131 lb 12.8 oz (59.8 kg)   SpO2 100%   BMI 26.62 kg/m   GEN: Well nourished, well developed, in no acute distress. HEENT: normal. Neck: Supple, no JVD, carotid bruits, or masses. Cardiac: RRR, no murmurs, rubs, or gallops. No clubbing, cyanosis, edema.  Radials/DP/PT 2+ and equal bilaterally.  Right femoral cath site without bruising, bleeding, or hematoma. Respiratory:  Respirations regular and unlabored, clear to auscultation bilaterally. GI: Soft, nontender, nondistended, BS + x 4. MS: no deformity or atrophy. Skin: warm and dry, no rash. Neuro:  Strength and sensation are intact. Psych: Normal affect.  Accessory Clinical Findings    ECG personally reviewed by me today -sinus bradycardia, 54 bpm, LAFB- no acute changes.   Lab  Results  Component Value Date   WBC 10.4 06/09/2022   HGB 12.2 06/09/2022   HCT 36.0 06/09/2022   MCV 94.5 06/09/2022   PLT 223 06/09/2022   Lab Results  Component Value Date   CREATININE 1.60 (H) 06/09/2022   BUN 20 06/09/2022   NA 140 06/09/2022   K 4.0 06/09/2022   CL 113 (H) 06/09/2022   CO2 21 (L) 06/09/2022   Lab Results  Component Value Date   ALT 11 06/05/2022   AST 17 06/05/2022   ALKPHOS 60 06/05/2022   BILITOT 0.5 06/05/2022   Lab Results  Component Value Date   CHOL 225 (H) 12/29/2021   HDL 72 12/29/2021   LDLCALC 130 (H) 12/29/2021   TRIG 114 12/29/2021   CHOLHDL 3.1 12/29/2021    Lab Results  Component Value Date   HGBA1C 5.5 12/29/2021    Assessment & Plan  1. CAD: S/p NSTEMI, DES-RCA in 05/2022. Stable without anginal symptoms.  Continue aspirin, Brilinta, amlodipine, metoprolol, and Lipitor.  2. Hypertension: BP well controlled. Continue current antihypertensive regimen.   3. Hyperlipidemia: LDL was 130 in 12/2021.  She is on Lipitor 40 mg daily.  She is due for repeat lipids, LFTs.  She states she will have these drawn through her PCP.  Continue aspirin, Lipitor.  4. History of CVA: No documented recurrence.  Continue aspirin, Lipitor.  5. CKD stage IIIb: Creatinine was 1.6 on 06/09/2022.  Stable.  She states she is due for her physical and will have repeat labs drawn in 2 weeks with her PCP. She does not wish to have labs drawn today.  6. Disposition: Follow-up in 3 to 4 months.     Lenna Sciara, NP 06/24/2022, 10:26 AM

## 2022-06-24 ENCOUNTER — Encounter: Payer: Self-pay | Admitting: Nurse Practitioner

## 2022-06-24 ENCOUNTER — Ambulatory Visit: Payer: Medicare Other | Attending: Nurse Practitioner | Admitting: Nurse Practitioner

## 2022-06-24 VITALS — BP 133/76 | HR 54 | Ht 59.0 in | Wt 131.8 lb

## 2022-06-24 DIAGNOSIS — N1832 Chronic kidney disease, stage 3b: Secondary | ICD-10-CM | POA: Diagnosis not present

## 2022-06-24 DIAGNOSIS — I10 Essential (primary) hypertension: Secondary | ICD-10-CM

## 2022-06-24 DIAGNOSIS — I2510 Atherosclerotic heart disease of native coronary artery without angina pectoris: Secondary | ICD-10-CM | POA: Diagnosis not present

## 2022-06-24 DIAGNOSIS — Z8673 Personal history of transient ischemic attack (TIA), and cerebral infarction without residual deficits: Secondary | ICD-10-CM | POA: Diagnosis not present

## 2022-06-24 DIAGNOSIS — E785 Hyperlipidemia, unspecified: Secondary | ICD-10-CM

## 2022-06-24 NOTE — Patient Instructions (Signed)
Medication Instructions:  Your physician recommends that you continue on your current medications as directed. Please refer to the Current Medication list given to you today.   *If you need a refill on your cardiac medications before your next appointment, please call your pharmacy*   Lab Work: NONE ordered at this time of appointment   If you have labs (blood work) drawn today and your tests are completely normal, you will receive your results only by: [place] (if you have MyChart) OR A paper copy in the mail If you have any lab test that is abnormal or we need to change your treatment, we will call you to review the results.   Testing/Procedures: NONE ordered at this time of appointment     Follow-Up: At [facility], you and your health needs are our priority.  As part of our continuing mission to provide you with exceptional heart care, we have created designated Provider Care Teams.  These Care Teams include your primary Cardiologist (physician) and Advanced Practice Providers (APPs -  Physician Assistants and Nurse Practitioners) who all work together to provide you with the care you need, when you need it.  We recommend signing up for the patient portal called "MyChart".  Sign up information is provided on this After Visit Summary.  MyChart is used to connect with patients for Virtual Visits (Telemedicine).  Patients are able to view lab/test results, encounter notes, upcoming appointments, etc.  Non-urgent messages can be sent to your provider as well.   To learn more about what you can do with MyChart, go to [email or web address].    Your next appointment:   3-4 month(s)  The format for your next appointment:   In Person  Provider:   None  or Diona Browner, NP        Other Instructions   Important Information About Sugar

## 2022-07-01 DIAGNOSIS — H401133 Primary open-angle glaucoma, bilateral, severe stage: Secondary | ICD-10-CM | POA: Diagnosis not present

## 2022-07-13 DIAGNOSIS — N184 Chronic kidney disease, stage 4 (severe): Secondary | ICD-10-CM | POA: Diagnosis not present

## 2022-07-13 DIAGNOSIS — Z0000 Encounter for general adult medical examination without abnormal findings: Secondary | ICD-10-CM | POA: Diagnosis not present

## 2022-07-13 DIAGNOSIS — E7800 Pure hypercholesterolemia, unspecified: Secondary | ICD-10-CM | POA: Diagnosis not present

## 2022-07-13 DIAGNOSIS — M810 Age-related osteoporosis without current pathological fracture: Secondary | ICD-10-CM | POA: Diagnosis not present

## 2022-07-13 DIAGNOSIS — I10 Essential (primary) hypertension: Secondary | ICD-10-CM | POA: Diagnosis not present

## 2022-07-13 DIAGNOSIS — S0093XA Contusion of unspecified part of head, initial encounter: Secondary | ICD-10-CM | POA: Diagnosis not present

## 2022-07-13 DIAGNOSIS — E559 Vitamin D deficiency, unspecified: Secondary | ICD-10-CM | POA: Diagnosis not present

## 2022-07-15 ENCOUNTER — Other Ambulatory Visit: Payer: Self-pay

## 2022-07-15 ENCOUNTER — Ambulatory Visit: Payer: Medicare Other | Attending: Family Medicine | Admitting: Physical Therapy

## 2022-07-15 DIAGNOSIS — R262 Difficulty in walking, not elsewhere classified: Secondary | ICD-10-CM | POA: Diagnosis not present

## 2022-07-15 DIAGNOSIS — R293 Abnormal posture: Secondary | ICD-10-CM | POA: Diagnosis not present

## 2022-07-15 DIAGNOSIS — M542 Cervicalgia: Secondary | ICD-10-CM | POA: Insufficient documentation

## 2022-07-15 DIAGNOSIS — M6281 Muscle weakness (generalized): Secondary | ICD-10-CM | POA: Insufficient documentation

## 2022-07-15 NOTE — Therapy (Signed)
OUTPATIENT PHYSICAL THERAPY CERVICAL EVALUATION   Patient Name: Laurie [name] MRN: [id number] DOB:04-May-1932, 88 y.o., female Today's Date: 07/15/2022  END OF SESSION:  PT End of Session - 07/15/22 1357     Visit Number 1    Number of Visits 16    Date for PT Re-Evaluation 09/17/22    Authorization Type UHC MEDICARE    Progress Note Due on Visit 10    PT Start Time 1147    PT Stop Time 1230    PT Time Calculation (min) 43 min    Activity Tolerance Patient tolerated treatment well    Behavior During Therapy WFL for tasks assessed/performed             Past Medical History:  Diagnosis Date   CKD (chronic kidney disease), stage III ([place])    Family history of breast cancer    mother   Family history of colon cancer    father   Glaucoma    Osteopenia    SBO (small bowel obstruction) ([place])    Past Surgical History:  Procedure Laterality Date   APPENDECTOMY     CESAREAN SECTION     x6   CORONARY STENT INTERVENTION N/A 06/08/2022   Procedure: CORONARY STENT INTERVENTION;  Surgeon: Troy Sine, MD;  Location: [place] [facility];  Service: Cardiovascular;  Laterality: N/A;   ESOPHAGOGASTRODUODENOSCOPY  07/07/2012   Procedure: ESOPHAGOGASTRODUODENOSCOPY (EGD);  Surgeon: Arta Silence, MD;  Location: [facility] ENDOSCOPY;  Service: Endoscopy;  Laterality: N/A;   GLAUCOMA SURGERY Bilateral 02/17/2022   LEFT COLECTOMY  04/30/2011   Sr Streck   LEFT HEART CATH AND CORONARY ANGIOGRAPHY N/A 06/08/2022   Procedure: LEFT HEART CATH AND CORONARY ANGIOGRAPHY;  Surgeon: Troy Sine, MD;  Location: [place] [facility];  Service: Cardiovascular;  Laterality: N/A;   tubal cyst  04/30/2011   L fallopian tube cyst incidentally found   Patient Active Problem List   Diagnosis Date Noted   Non-ST elevation (NSTEMI) myocardial infarction ([place]) 06/06/2022   Elevated troponin 06/05/2022   Hiatal hernia 06/05/2022   Elevated d-dimer 06/05/2022   Abnormal urinalysis 06/05/2022   HLD  (hyperlipidemia) 06/05/2022   History of stroke 06/05/2022   Chronic kidney disease, stage 3b ([place]) 06/05/2022   Uncontrolled hypertension 12/30/2021   Glaucoma 12/29/2021   Acute ischemic stroke ([place]) 12/28/2021   Chronic pain of right knee 01/15/2020   Unilateral primary osteoarthritis, left knee 01/25/2018   Unilateral primary osteoarthritis, right knee 01/25/2018   Chronic pain of both knees 01/25/2018   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 Her son is present with her, whom states that she has been wearing a neck brace on and off for 3 years. Pt presents with significant bruising on her face due to a recent fall in her bathroom. Her son states that she had a stroke on her L side  in June 2023, and a heart attack in November 2023. Pt states that she does not fall frequently but does have some difficulty with transitions. Pt reports having a pinched nerve in her R side that has been causing her pain for 3+ years. She has seen a chiropractor for her neck, with no relief. Pt uses a quad cane, but did not have it present today.   PERTINENT HISTORY:  Uncontrolled HTN, history of a stroke, Non- ST elevation MI, glaucoma  PAIN:  Are you  having pain? Yes: NPRS scale: 5-6/10 Pain location: R cervical spine Pain description: Sharp Aggravating factors: Head flexion,  Relieving factors: Rest.   PRECAUTIONS: Fall  WEIGHT BEARING RESTRICTIONS: No  FALLS:  Has patient fallen in last 6 months? Yes. Number of falls 2  LIVING ENVIRONMENT: Lives with: lives with their son Lives in: House/apartment Stairs: Yes: Internal: 12 steps; on right going up and External: 2 steps; on right going up Has following equipment at home: Quad cane large base, Walker - 4 wheeled, and shower chair  OCCUPATION: Retired   PLOF: Independent  PATIENT GOALS: Pt would like to reduce the pain in her neck.   NEXT MD VISIT:   Cervical  AROM  07/15/2022  Flexion 15 Sharp pain  Extension 10  Side bending L  10  Side bending R 7  Rotation L  15  Rotation R 12   Full UE mobility with no pain reported  Today's treatment: Creating, reviewing, and completing below HEP   Exercises:  Access Code: [email or web address] URL: [email or web address] Date: 07/15/2022 Prepared by: Rudi Heap  Exercises - Supine Chin Tuck  - 2 x daily - 7 x weekly - 2 sets - 10 reps - 2 hold - Supine Cervical Rotation AROM on Pillow  - 2 x daily - 7 x weekly - 2 sets - 10 reps - 2 hold  ASSESSMENT:  CLINICAL IMPRESSION: Patient referred to PT for cervical neck pain. Patient will benefit from skilled PT to address below impairments, limitations and improve overall function.  OBJECTIVE IMPAIRMENTS: decreased activity tolerance, decreased shoulder mobility, decreased ROM, decreased strength, impaired flexibility, impaired UE use, postural dysfunction, and pain.  ACTIVITY LIMITATIONS: reaching, lifting, carry,  cleaning, driving, and or occupation  PERSONAL FACTORS:  also affecting patient's functional outcome.  REHAB POTENTIAL: Good  CLINICAL DECISION MAKING: Stable/uncomplicated  EVALUATION COMPLEXITY: Low    GOALS: Short term PT Goals Target date:   08/05/2022 Pt will be I and compliant with HEP. Baseline:  Goal status: New Pt will decrease pain by 25% overall Baseline: Goal status: New  Long term PT goals Target date: 09/09/2022 Pt will improve cervical AROM by 5 degrees in all directions to improve cervical movements with walking.  Baseline: Goal status: New Pt will be able to ambulate 1040f with an appropriate AD in order to run errands.  Baseline: Goal status: New Pt will report <2 falls throughout her duration of PT.  Baseline: Goal status: New Pt will reduce pain to overall less than 3/10 with usual activity and work activity. Baseline: Goal status: New      5. Pt will be independent with a  comprehensive HEP prior to discharge.   PLAN: PT FREQUENCY: 2x  PT DURATION: 8 weeks  PLANNED INTERVENTIONS (unless contraindicated): aquatic PT, Canalith repositioning, cryotherapy, Electrical stimulation, Iontophoresis with 4 mg/ml dexamethasome, Moist heat, traction, Ultrasound, gait training, Therapeutic exercise, balance training, neuromuscular re-education, patient/family education, prosthetic training, manual techniques, passive ROM, dry needling, taping, vasopnuematic device, vestibular, spinal manipulations, joint manipulations  PLAN FOR NEXT SESSION: review HEP, assess balance    Lynden Ang, PT 07/15/2022, 1:58 PM

## 2022-07-16 ENCOUNTER — Ambulatory Visit: Payer: Medicare Other | Admitting: Physical Therapy

## 2022-07-16 DIAGNOSIS — M542 Cervicalgia: Secondary | ICD-10-CM | POA: Diagnosis not present

## 2022-07-16 DIAGNOSIS — M6281 Muscle weakness (generalized): Secondary | ICD-10-CM | POA: Diagnosis not present

## 2022-07-16 DIAGNOSIS — R293 Abnormal posture: Secondary | ICD-10-CM

## 2022-07-16 DIAGNOSIS — R262 Difficulty in walking, not elsewhere classified: Secondary | ICD-10-CM | POA: Diagnosis not present

## 2022-07-16 NOTE — Therapy (Signed)
OUTPATIENT PHYSICAL THERAPY CERVICAL EVALUATION   Patient Name: Laurie [name] MRN: [id number] DOB:09/15/1931, 88 y.o., female Today's Date: 07/16/2022  END OF SESSION:  PT End of Session - 07/16/22 1241     Visit Number 2    Number of Visits 16    Date for PT Re-Evaluation 09/17/22    Authorization Type UHC MEDICARE    Progress Note Due on Visit 10    PT Start Time 1237    PT Stop Time 1315    PT Time Calculation (min) 38 min    Activity Tolerance Patient tolerated treatment well             Past Medical History:  Diagnosis Date   CKD (chronic kidney disease), stage III ([place])    Family history of breast cancer    mother   Family history of colon cancer    father   Glaucoma    Osteopenia    SBO (small bowel obstruction) ([place])    Past Surgical History:  Procedure Laterality Date   APPENDECTOMY     CESAREAN SECTION     x6   CORONARY STENT INTERVENTION N/A 06/08/2022   Procedure: CORONARY STENT INTERVENTION;  Surgeon: Troy Sine, MD;  Location: [place] [facility];  Service: Cardiovascular;  Laterality: N/A;   ESOPHAGOGASTRODUODENOSCOPY  07/07/2012   Procedure: ESOPHAGOGASTRODUODENOSCOPY (EGD);  Surgeon: Arta Silence, MD;  Location: [facility] ENDOSCOPY;  Service: Endoscopy;  Laterality: N/A;   GLAUCOMA SURGERY Bilateral 02/17/2022   LEFT COLECTOMY  04/30/2011   Sr Streck   LEFT HEART CATH AND CORONARY ANGIOGRAPHY N/A 06/08/2022   Procedure: LEFT HEART CATH AND CORONARY ANGIOGRAPHY;  Surgeon: Troy Sine, MD;  Location: [place] [facility];  Service: Cardiovascular;  Laterality: N/A;   tubal cyst  04/30/2011   L fallopian tube cyst incidentally found   Patient Active Problem List   Diagnosis Date Noted   Non-ST elevation (NSTEMI) myocardial infarction ([place]) 06/06/2022   Elevated troponin 06/05/2022   Hiatal hernia 06/05/2022   Elevated d-dimer 06/05/2022   Abnormal urinalysis 06/05/2022   HLD (hyperlipidemia) 06/05/2022   History of stroke 06/05/2022    Chronic kidney disease, stage 3b ([place]) 06/05/2022   Uncontrolled hypertension 12/30/2021   Glaucoma 12/29/2021   Acute ischemic stroke ([place]) 12/28/2021   Chronic pain of right knee 01/15/2020   Unilateral primary osteoarthritis, left knee 01/25/2018   Unilateral primary osteoarthritis, right knee 01/25/2018   Chronic pain of both knees 01/25/2018   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  Patient does not have an assistive device and walks with short shuffled steps (therapist providing close supervision for safety).  She has severe facial bruising from recent fall.  She reports moderate neck pain today.   PERTINENT HISTORY:  Per eval:   wearing a neck brace on and off for 3 years. Pt presents with significant bruising on her face due to a recent fall in her bathroom. Her son states that she had a stroke on her L side  in June 2023, and a heart attack in November 2023. Pt states that she does not fall frequently but does have some difficulty with transitions. Pt reports having a pinched nerve in her R side that has been causing her pain for 3+ years. She has seen a chiropractor for her neck, with no relief. Pt uses a quad cane, but did not have it present today.     Uncontrolled HTN, history of a mild stroke in June with no residual effects , Non- ST elevation MI in  November 2023 glaucoma PAIN:  Are you having pain? Yes: NPRS scale: 5-6/10 Pain location: R cervical spine Pain description: Sharp Aggravating factors: Head flexion,  Relieving factors: Rest.   PRECAUTIONS: Fall  WEIGHT BEARING RESTRICTIONS: No  FALLS:  Has patient fallen in last 6 months? Yes. Number of falls 2  LIVING ENVIRONMENT: Lives with: lives with their son Lives in: House/apartment Stairs: Yes: Internal: 12 steps; on right going up and External: 2 steps; on right going up Has following equipment at home: Quad cane large base, Walker - 4 wheeled, and shower chair  OCCUPATION: Retired   PLOF: Independent  PATIENT GOALS: Pt would like to reduce the pain in her neck.   NEXT MD VISIT:   Cervical  AROM  07/15/2022  Flexion 15 Sharp pain  Extension 10  Side bending L  10  Side bending R 7  Rotation L  15  Rotation R 12   Full UE mobility with no pain reported  Today's treatment:  07/16/22: Manual therapy:  soft tissue mobilization in sitting to right upper traps and cervical paraspinals; supine further soft tissue mobilization including suboccipital release, grade 1/2 cervical distraction; contract/relax right upper trap 3x 5 sec holds; passive cervical rotation 5x right/left and passive cervical lateral flexion 5x right/left Review of supine cervical rotation on pillow 5x  Initiated seated cervical sidebending 5x, may need further review Initial certification not signed by MD to include balance, suggested patient  call the doctor's office and specifically request a referral to include balance in this course of PT    Exercises:  Access Code: [email or web address] URL: [email or web address] Date: 07/15/2022 Prepared by: Rudi Heap  Exercises - Supine Chin Tuck  - 2 x daily - 7 x weekly - 2 sets - 10 reps - 2 hold - Supine Cervical Rotation AROM on Pillow  - 2 x daily - 7 x weekly - 2 sets - 10 reps - 2 hold  ASSESSMENT:  CLINICAL IMPRESSION: Patient is  very stiff consistent with 3 years of cervical collar use.  She responds well to manual techniques in both seated and supine positions providing pain relief.   She will need further review and cues to perform ex's correctly. Therapist monitoring response to all interventions and modifying treatment accordingly.  She is interested in PT for balance and recommended she call doctor's office for a referral.    OBJECTIVE IMPAIRMENTS: decreased activity tolerance,  decreased shoulder mobility, decreased ROM, decreased strength, impaired flexibility, impaired UE use, postural dysfunction, and pain.  ACTIVITY LIMITATIONS: reaching, lifting, carry,  cleaning, driving, and or occupation  PERSONAL FACTORS:  also affecting patient's functional outcome.  REHAB POTENTIAL: Good  CLINICAL DECISION MAKING: Stable/uncomplicated  EVALUATION COMPLEXITY: Low    GOALS: Short term PT Goals Target date:  08/06/2022 Pt will be I and compliant with HEP. Baseline:  Goal status: New Pt will decrease pain by 25% overall Baseline: Goal status: New  Long term PT goals Target date: 09/10/2022 Pt will improve cervical AROM by 5 degrees in all directions to improve cervical movements with walking.  Baseline: Goal status: New Pt will be able to ambulate 1065f with an appropriate AD in order to run errands.  Baseline: Goal status: New Pt will report <2 falls throughout her duration of PT.  Baseline: Goal status: New Pt will reduce pain to overall less than 3/10 with usual activity and work activity. Baseline: Goal status: New      5. Pt will be independent with a comprehensive HEP prior to discharge.   PLAN: PT FREQUENCY: 2x  PT DURATION: 8 weeks  PLANNED INTERVENTIONS (unless contraindicated): aquatic PT, Canalith repositioning, cryotherapy, Electrical stimulation, Iontophoresis with 4 mg/ml dexamethasome, Moist heat, traction, Ultrasound, gait training, Therapeutic exercise, balance training, neuromuscular  re-education, patient/family education, prosthetic training, manual techniques, passive ROM, dry needling, taping, vasopnuematic device, vestibular, spinal manipulations, joint manipulations  PLAN FOR NEXT SESSION: review HEP, manual therapy;  assess balance if cert signed or referral received; encourage to use QC  SRuben Im PT 07/16/22 5:30 PM Phone: [phone]: [phone]

## 2022-07-21 ENCOUNTER — Encounter: Payer: Self-pay | Admitting: Rehabilitative and Restorative Service Providers"

## 2022-07-21 ENCOUNTER — Ambulatory Visit: Payer: Medicare Other | Admitting: Rehabilitative and Restorative Service Providers"

## 2022-07-21 DIAGNOSIS — R262 Difficulty in walking, not elsewhere classified: Secondary | ICD-10-CM

## 2022-07-21 DIAGNOSIS — R293 Abnormal posture: Secondary | ICD-10-CM | POA: Diagnosis not present

## 2022-07-21 DIAGNOSIS — M6281 Muscle weakness (generalized): Secondary | ICD-10-CM | POA: Diagnosis not present

## 2022-07-21 DIAGNOSIS — M542 Cervicalgia: Secondary | ICD-10-CM | POA: Diagnosis not present

## 2022-07-21 NOTE — Therapy (Signed)
OUTPATIENT PHYSICAL THERAPY TREATMENT NOTE   Patient Name: Laurie [name] MRN: [id number] DOB:Jun 29, 1932, 88 y.o., female Today's Date: 07/21/2022  END OF SESSION:  PT End of Session - 07/21/22 1450     Visit Number 3    Date for PT Re-Evaluation 09/17/22    Authorization Type [employer] MEDICARE    Progress Note Due on Visit 10    PT Start Time 1446    PT Stop Time 1525    PT Time Calculation (min) 39 min    Activity Tolerance Patient tolerated treatment well    Behavior During Therapy WFL for tasks assessed/performed             Past Medical History:  Diagnosis Date   CKD (chronic kidney disease), stage III ([place])    Family history of breast cancer    mother   Family history of colon cancer    father   Glaucoma    Osteopenia    SBO (small bowel obstruction) ([place])    Past Surgical History:  Procedure Laterality Date   APPENDECTOMY     CESAREAN SECTION     x6   CORONARY STENT INTERVENTION N/A 06/08/2022   Procedure: CORONARY STENT INTERVENTION;  Surgeon: Troy Sine, MD;  Location: [facility];  Service: Cardiovascular;  Laterality: N/A;   ESOPHAGOGASTRODUODENOSCOPY  07/07/2012   Procedure: ESOPHAGOGASTRODUODENOSCOPY (EGD);  Surgeon: Arta Silence, MD;  Location: [facility] ENDOSCOPY;  Service: Endoscopy;  Laterality: N/A;   GLAUCOMA SURGERY Bilateral 02/17/2022   LEFT COLECTOMY  04/30/2011   Sr Streck   LEFT HEART CATH AND CORONARY ANGIOGRAPHY N/A 06/08/2022   Procedure: LEFT HEART CATH AND CORONARY ANGIOGRAPHY;  Surgeon: Troy Sine, MD;  Location: [facility] [facility];  Service: Cardiovascular;  Laterality: N/A;   tubal cyst  04/30/2011   L fallopian tube cyst incidentally found   Patient Active Problem List   Diagnosis Date Noted   Non-ST elevation (NSTEMI) myocardial infarction ([place]) 06/06/2022   Elevated troponin 06/05/2022   Hiatal hernia 06/05/2022   Elevated d-dimer 06/05/2022   Abnormal urinalysis 06/05/2022   HLD (hyperlipidemia) 06/05/2022    History of stroke 06/05/2022   Chronic kidney disease, stage 3b ([place]) 06/05/2022   Uncontrolled hypertension 12/30/2021   Glaucoma 12/29/2021   Acute ischemic stroke ([place]) 12/28/2021   Chronic pain of right knee 01/15/2020   Unilateral primary osteoarthritis, left knee 01/25/2018   Unilateral primary osteoarthritis, right knee 01/25/2018   Chronic pain of both knees 01/25/2018   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[place]) 12/22/2010    PCP: Lawerance Cruel, MD   REFERRING PROVIDER: Lawerance Cruel, MD   REFERRING DIAG: Cervicalgia [M54.2]   THERAPY DIAG:  Cervicalgia  Abnormal posture  Muscle weakness (generalized)  Difficulty in walking, not elsewhere classified  Rationale for Evaluation and Treatment: Rehabilitation  ONSET DATE: 3+ years ago  SUBJECTIVE:  SUBJECTIVE STATEMENT: Patient arrives stating some soreness in her neck today.  States that she spoke with Dr Alan Ripper office about signing her PT plan of care orders.    PERTINENT HISTORY:  Per eval:   wearing a neck brace on and off for 3 years. Pt presents with significant bruising on her face due to a recent fall in her bathroom. Her son states that she had a stroke on her L side  in June 2023, and a heart attack in November 2023. Pt states that she does not fall frequently but does have some difficulty with transitions. Pt reports having a pinched nerve in her R side that has been causing her pain for 3+ years. She has seen a chiropractor for her neck, with no relief. Pt uses a quad cane, but did not have it present today.     Uncontrolled HTN, history of a mild stroke in June with no residual effects , Non- ST elevation MI in November 2023 glaucoma PAIN:  Are you having pain? Yes: NPRS scale: 5-6/10 Pain location: R  cervical spine Pain description: Sharp Aggravating factors: Head flexion,  Relieving factors: Rest.   PRECAUTIONS: Fall  WEIGHT BEARING RESTRICTIONS: No  FALLS:  Has patient fallen in last 6 months? Yes. Number of falls 2  LIVING ENVIRONMENT: Lives with: lives with their son Lives in: House/apartment Stairs: Yes: Internal: 12 steps; on right going up and External: 2 steps; on right going up Has following equipment at home: Quad cane large base, Walker - 4 wheeled, and shower chair  OCCUPATION: Retired   PLOF: Independent  PATIENT GOALS: Pt would like to reduce the pain in her neck.   OBJECTIVE:  Cervical ROM AROM  07/15/2022  Flexion 15 Sharp pain  Extension 10  Side bending L  10  Side bending R 7  Rotation L  15  Rotation R 12   Full UE mobility with no pain reported  FUNCTIONAL TESTS:  07/21/2022: 5 times sit to/from stand:  31.7 sec with use of UE and legs against back of chair Timed Up and Go:  25.1 sec with CGA of PT  TODAY'S TREATMENT:   07/21/2022: Nustep (blue machine, seat at 6) Level 1 x6 min with PT present to discuss status Seated cervical rotation and extension x20 each Seated cervical retraction 2x10 Sit to/from stand x5 reps TUG Seated shoulder ER and horizontal abduction with yellow tband 2x10 each Standing shoulder flexion and abduction with 1# dumbbells 2x10 bilat each Manual therapy:  soft tissue mobilization to bilateral cervical paraspinals and upper traps   07/16/22: Manual therapy:  soft tissue mobilization in sitting to right upper traps and cervical paraspinals; supine further soft tissue mobilization including suboccipital release, grade 1/2 cervical distraction; contract/relax right upper trap 3x 5 sec holds; passive cervical rotation 5x right/left and passive cervical lateral flexion 5x right/left Review of supine cervical rotation on pillow 5x  Initiated seated cervical sidebending 5x, may need further review Initial  certification not signed by MD to include balance, suggested patient  call the doctor's office and specifically request a referral to include balance in this course of PT   Patient Education: Patient issued HEP and educated on daily performance.  Pt provided with printed HEP.  Patient returned demonstration and verbalizes understanding.   HOME EXERCISE PLAN:  Access Code: [email or web address] URL: [email or web address] Date: 07/15/2022 Prepared by: Rudi Heap  Exercises - Supine Chin Tuck  - 2 x daily - 7 x weekly - 2 sets - 10  reps - 2 hold - Supine Cervical Rotation AROM on Pillow  - 2 x daily - 7 x weekly - 2 sets - 10 reps - 2 hold  ASSESSMENT:  CLINICAL IMPRESSION:  Ms Gariepy presents to skilled PT with reports of increased soreness today, but does state that she felt some better after last visit.  Patient able to undergo objective testing of 5 times sit to/from stand and TUG during session today.  Patient able to progress with strengthening exercises during session with reports of fatigue, but not increased pain.  Pt requires minimal cuing for improved posture during exercises.  Following manual therapy, pt denies any pain.  OBJECTIVE IMPAIRMENTS: decreased activity tolerance, decreased shoulder mobility, decreased ROM, decreased strength, impaired flexibility, impaired UE use, postural dysfunction, and pain.  ACTIVITY LIMITATIONS: reaching, lifting, carry,  cleaning, driving, and or occupation  PERSONAL FACTORS:  also affecting patient's functional outcome.  REHAB POTENTIAL: Good  CLINICAL DECISION MAKING: Stable/uncomplicated  EVALUATION COMPLEXITY: Low    GOALS: Short term PT Goals Target date:  08/11/2022 Pt will be I and compliant with HEP. Baseline:  Goal status: ONGOING  Pt will decrease pain by 25% overall Baseline: Goal status: New  Long term PT goals Target date: 09/15/2022 Pt will improve cervical AROM by 5 degrees in all directions to improve  cervical movements with walking.  Baseline: Goal status: New  Pt will be able to ambulate 1050f with an appropriate AD in order to run errands.  Baseline: Goal status: New  Pt will report <2 falls throughout her duration of PT.  Baseline: Goal status: New  Pt will reduce pain to overall less than 3/10 with usual activity and work activity. Baseline: Goal status: New       5. Pt will be independent with a comprehensive HEP prior to discharge.  Baseline: Goal Status:  NEW  PLAN: PT FREQUENCY: 2x  PT DURATION: 8 weeks  PLANNED INTERVENTIONS (unless contraindicated): aquatic PT, Canalith repositioning, cryotherapy, Electrical stimulation, Iontophoresis with 4 mg/ml dexamethasome, Moist heat, traction, Ultrasound, gait training, Therapeutic exercise, balance training, neuromuscular re-education, patient/family education, prosthetic training, manual techniques, passive ROM, dry needling, taping, vasopnuematic device, vestibular, spinal manipulations, joint manipulations  PLAN FOR NEXT SESSION: review HEP, manual therapy;  encourage to use QC  SJuel Burrow PT 07/21/22 4:17 PM  [facility] [address] [place] [place] [address]Phone # [phone] [phone]

## 2022-07-29 ENCOUNTER — Ambulatory Visit: Payer: Medicare Other | Attending: Family Medicine | Admitting: Physical Therapy

## 2022-07-29 DIAGNOSIS — M6281 Muscle weakness (generalized): Secondary | ICD-10-CM | POA: Insufficient documentation

## 2022-07-29 DIAGNOSIS — M542 Cervicalgia: Secondary | ICD-10-CM | POA: Insufficient documentation

## 2022-07-29 DIAGNOSIS — R262 Difficulty in walking, not elsewhere classified: Secondary | ICD-10-CM | POA: Diagnosis not present

## 2022-07-29 DIAGNOSIS — R293 Abnormal posture: Secondary | ICD-10-CM | POA: Insufficient documentation

## 2022-07-29 NOTE — Therapy (Signed)
OUTPATIENT PHYSICAL THERAPY TREATMENT NOTE   Patient Name: NANCYLEE GAINES MRN: [id number] DOB:1932-06-24, 89 y.o., female Today's Date: 07/29/2022  END OF SESSION:  PT End of Session - 07/29/22 1521     Visit Number 4    Number of Visits 16    Date for PT Re-Evaluation 09/17/22    Authorization Type [employer] MEDICARE    Progress Note Due on Visit 10    PT Start Time 1451    PT Stop Time 1530    PT Time Calculation (min) 39 min    Activity Tolerance Patient limited by fatigue;Patient tolerated treatment well    Behavior During Therapy WFL for tasks assessed/performed              Past Medical History:  Diagnosis Date   CKD (chronic kidney disease), stage III ([place])    Family history of breast cancer    mother   Family history of colon cancer    father   Glaucoma    Osteopenia    SBO (small bowel obstruction) ([place])    Past Surgical History:  Procedure Laterality Date   APPENDECTOMY     CESAREAN SECTION     x6   CORONARY STENT INTERVENTION N/A 06/08/2022   Procedure: CORONARY STENT INTERVENTION;  Surgeon: Troy Sine, MD;  Location: [place] [facility] [facility];  Service: Cardiovascular;  Laterality: N/A;   ESOPHAGOGASTRODUODENOSCOPY  07/07/2012   Procedure: ESOPHAGOGASTRODUODENOSCOPY (EGD);  Surgeon: Arta Silence, MD;  [facility] END[facility]COPY;  Service: Endoscopy;  Laterality: N/A;   GLAUCOMA SURGERY Bilateral 02/17/2022   LEFT COLECTOMY  04/30/2011   Sr Streck   LEFT HEART CATH AND CORONARY ANGIOGRAPHY N/A 06/08/2022   Procedure: LEFT HEART CATH AND CORONARY ANGIOGRAPHY;  Surgeon: Troy Sine, MD;  Location: [place] [facility];  Service: Cardiovascular;  Laterality: N/A;   tubal cyst  04/30/2011   L fallopian tube cyst incidentally found   Patient Active Problem List   Diagnosis Date Noted   Non-ST elevation (NSTEMI) myocardial infarction ([place]) 06/06/2022   Elevated troponin 06/05/2022   Hiatal hernia 06/05/2022   Elevated d-dimer 06/05/2022   Abnormal  urinalysis 06/05/2022   HLD (hyperlipidemia) 06/05/2022   History of stroke 06/05/2022   Chronic kidney disease, stage 3b ([place]) 06/05/2022   Uncontrolled hypertension 12/30/2021   Glaucoma 12/29/2021   Acute ischemic stroke ([place]) 12/28/2021   Chronic pain of right knee 01/15/2020   Unilateral primary osteoarthritis, left knee 01/25/2018   Unilateral primary osteoarthritis, right knee 01/25/2018   Chronic pain of both knees 01/25/2018   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[place]) 12/22/2010    PCP: Lawerance Cruel, MD   REFERRING PROVIDER: Lawerance Cruel, MD   REFERRING DIAG: Cervicalgia [M54.2]   THERAPY DIAG:  Cervicalgia  Abnormal posture  Muscle weakness (generalized)  Difficulty in walking, not elsewhere classified  Rationale for Evaluation and Treatment: Rehabilitation  ONSET DATE: 3+ years ago  SUBJECTIVE:  SUBJECTIVE STATEMENT: Pt states that she has not been utilizing her neck brace as frequently, but had to use it yesterday due to severe pain in her neck.     PERTINENT HISTORY:  Per eval:   wearing a neck brace on and off for 3 years. Pt presents with significant bruising on her face due to a recent fall in her bathroom. Her son states that she had a stroke on her L side  in June 2023, and a heart attack in November 2023. Pt states that she does not fall frequently but does have some difficulty with transitions. Pt reports having a pinched nerve in her R side that has been causing her pain for 3+ years. She has seen a chiropractor for her neck, with no relief. Pt uses a quad cane, but did not have it present today.     Uncontrolled HTN, history of a mild stroke in June with no residual effects , Non- ST elevation MI in November 2023 glaucoma PAIN:  Are you having pain?  Yes: NPRS scale: 5-6/10 Pain location: R cervical spine Pain description: Sharp Aggravating factors: Head flexion,  Relieving factors: Rest.   PRECAUTIONS: Fall  WEIGHT BEARING RESTRICTIONS: No  FALLS:  Has patient fallen in last 6 months? Yes. Number of falls 2  LIVING ENVIRONMENT: Lives with: lives with their son Lives in: House/apartment Stairs: Yes: Internal: 12 steps; on right going up and External: 2 steps; on right going up Has following equipment at home: Quad cane large base, Walker - 4 wheeled, and shower chair  OCCUPATION: Retired   PLOF: Independent  PATIENT GOALS: Pt would like to reduce the pain in her neck.   OBJECTIVE:  Cervical ROM AROM  07/15/2022  Flexion 15 Sharp pain  Extension 10  Side bending L  10  Side bending R 7  Rotation L  15  Rotation R 12   Full UE mobility with no pain reported  FUNCTIONAL TESTS:  07/21/2022: 5 times sit to/from stand:  31.7 sec with use of UE and legs against back of chair Timed Up and Go:  25.1 sec with CGA of PT  TODAY'S TREATMENT:   07/29/2022: Nustep Level 2 x6 min with PT present to discuss status Seated cervical rotation and extension x20 each Seated cervical retraction 2x10 (pt requires tactile, verbal and visual cues). Sit to/from stand x5 reps Seated shoulder ER and horizontal abduction with yellow tband 2x10 each Standing shoulder flexion and abduction with 1# dumbbells 2x10 bilat each Standing at counter marching 40x  Standing at counter hip flexion/abduction 10, bilat    07/21/2022: Nustep Level 1 x6 min with PT present to discuss status Seated cervical rotation and extension x20 each Seated cervical retraction 2x10 Sit to/from stand x5 reps TUG Seated shoulder ER and horizontal abduction with yellow tband 2x10 each Standing shoulder flexion and abduction with 1# dumbbells 2x10 bilat each Manual therapy:  soft tissue mobilization to bilateral cervical paraspinals and upper  traps   07/16/22: Manual therapy:  soft tissue mobilization in sitting to right upper traps and cervical paraspinals; supine further soft tissue mobilization including suboccipital release, grade 1/2 cervical distraction; contract/relax right upper trap 3x 5 sec holds; passive cervical rotation 5x right/left and passive cervical lateral flexion 5x right/left Review of supine cervical rotation on pillow 5x  Initiated seated cervical sidebending 5x, may need further review Initial certification not signed by MD to include balance, suggested patient  call the doctor's office and specifically request a referral to  include balance in this course of PT   Patient Education: Patient issued HEP and educated on daily performance.  Pt provided with printed HEP.  Patient returned demonstration and verbalizes understanding.   HOME EXERCISE PLAN:  Access Code: [email or web address] URL: [email or web address] Date: 07/15/2022 Prepared by: Rudi Heap  Exercises - Supine Chin Tuck  - 2 x daily - 7 x weekly - 2 sets - 10 reps - 2 hold - Supine Cervical Rotation AROM on Pillow  - 2 x daily - 7 x weekly - 2 sets - 10 reps - 2 hold  ASSESSMENT:  CLINICAL IMPRESSION:  Ms Gunner presents to skilled PT with reports of increased soreness today, but does state that she is benefiting from PT. Pt tolerated all exercises and progressions well today. She required extensive cueing for cervical retraction today. Pt has a tendency to trunk lean posteriorly with chin tucks and cervical extension exercises. Initiated LE strengthening exercises today with fatigue reported, but no pain. Pt provided updated HEP including seated marching, scap retraction, shoulder flexion and adductor squeezes.   OBJECTIVE IMPAIRMENTS: decreased activity tolerance, decreased shoulder mobility, decreased ROM, decreased strength, impaired flexibility, impaired UE use, postural dysfunction, and pain.  ACTIVITY LIMITATIONS: reaching,  lifting, carry,  cleaning, driving, and or occupation  PERSONAL FACTORS:  also affecting patient's functional outcome.  REHAB POTENTIAL: Good  CLINICAL DECISION MAKING: Stable/uncomplicated  EVALUATION COMPLEXITY: Low    GOALS: Short term PT Goals Target date:  08/19/2022 Pt will be I and compliant with HEP. Baseline:  Goal status: ONGOING  Pt will decrease pain by 25% overall Baseline: Goal status: New  Long term PT goals Target date: 09/23/2022 Pt will improve cervical AROM by 5 degrees in all directions to improve cervical movements with walking.  Baseline: Goal status: New  Pt will be able to ambulate 1079f with an appropriate AD in order to run errands.  Baseline: Goal status: New  Pt will report <2 falls throughout her duration of PT.  Baseline: Goal status: New  Pt will reduce pain to overall less than 3/10 with usual activity and work activity. Baseline: Goal status: New       5. Pt will be independent with a comprehensive HEP prior to discharge.  Baseline: Goal Status:  NEW  PLAN: PT FREQUENCY: 2x  PT DURATION: 8 weeks  PLANNED INTERVENTIONS (unless contraindicated): aquatic PT, Canalith repositioning, cryotherapy, Electrical stimulation, Iontophoresis with 4 mg/ml dexamethasome, Moist heat, traction, Ultrasound, gait training, Therapeutic exercise, balance training, neuromuscular re-education, patient/family education, prosthetic training, manual techniques, passive ROM, dry needling, taping, vasopnuematic device, vestibular, spinal manipulations, joint manipulations  PLAN FOR NEXT SESSION: review HEP, manual therapy;  encourage to use QC  SRudi HeapPT, DPT 07/29/22  3:37 PM

## 2022-07-30 ENCOUNTER — Telehealth (HOSPITAL_COMMUNITY): Payer: Self-pay

## 2022-07-30 ENCOUNTER — Other Ambulatory Visit (HOSPITAL_COMMUNITY): Payer: Self-pay

## 2022-07-30 NOTE — Telephone Encounter (Signed)
Pt is not interested in the cardiac rehab program. Closed referral 

## 2022-07-31 ENCOUNTER — Other Ambulatory Visit (HOSPITAL_COMMUNITY): Payer: Self-pay

## 2022-08-02 DIAGNOSIS — M1711 Unilateral primary osteoarthritis, right knee: Secondary | ICD-10-CM | POA: Diagnosis not present

## 2022-08-04 ENCOUNTER — Ambulatory Visit: Payer: Medicare Other | Admitting: Podiatry

## 2022-08-05 ENCOUNTER — Ambulatory Visit: Payer: Medicare Other | Admitting: Physical Therapy

## 2022-08-05 DIAGNOSIS — R293 Abnormal posture: Secondary | ICD-10-CM | POA: Diagnosis not present

## 2022-08-05 DIAGNOSIS — R262 Difficulty in walking, not elsewhere classified: Secondary | ICD-10-CM | POA: Diagnosis not present

## 2022-08-05 DIAGNOSIS — M542 Cervicalgia: Secondary | ICD-10-CM

## 2022-08-05 DIAGNOSIS — M6281 Muscle weakness (generalized): Secondary | ICD-10-CM | POA: Diagnosis not present

## 2022-08-05 NOTE — Therapy (Signed)
OUTPATIENT PHYSICAL THERAPY TREATMENT NOTE   Patient Name: Laurie [name] MRN: [id number] DOB:April 12, 1932, 89 y.o., female Today's Date: 08/05/2022  END OF SESSION:  PT End of Session - 08/05/22 1530     Visit Number 5    Number of Visits 16    Date for PT Re-Evaluation 09/17/22    Authorization Type [employer] MEDICARE    Progress Note Due on Visit 10    PT Start Time 1531    PT Stop Time 1613    PT Time Calculation (min) 42 min    Activity Tolerance Patient tolerated treatment well              Past Medical History:  Diagnosis Date   CKD (chronic kidney disease), stage III ([place])    Family history of breast cancer    mother   Family history of colon cancer    father   Glaucoma    Osteopenia    SBO (small bowel obstruction) ([place])    Past Surgical History:  Procedure Laterality Date   APPENDECTOMY     CESAREAN SECTION     x6   CORONARY STENT INTERVENTION N/A 06/08/2022   Procedure: CORONARY STENT INTERVENTION;  Surgeon: Troy Sine, MD;  Location: [facility];  Service: Cardiovascular;  Laterality: N/A;   ESOPHAGOGASTRODUODENOSCOPY  07/07/2012   Procedure: ESOPHAGOGASTRODUODENOSCOPY (EGD);  Surgeon: Arta Silence, MD;  Location: [facility] ENDOSCOPY;  Service: Endoscopy;  Laterality: N/A;   GLAUCOMA SURGERY Bilateral 02/17/2022   LEFT COLECTOMY  04/30/2011   Sr Streck   LEFT HEART CATH AND CORONARY ANGIOGRAPHY N/A 06/08/2022   Procedure: LEFT HEART CATH AND CORONARY ANGIOGRAPHY;  Surgeon: Troy Sine, MD;  Location: [facility];  Service: Cardiovascular;  Laterality: N/A;   tubal cyst  04/30/2011   L fallopian tube cyst incidentally found   Patient Active Problem List   Diagnosis Date Noted   Non-ST elevation (NSTEMI) myocardial infarction ([place]) 06/06/2022   Elevated troponin 06/05/2022   Hiatal hernia 06/05/2022   Elevated d-dimer 06/05/2022   Abnormal urinalysis 06/05/2022   HLD (hyperlipidemia) 06/05/2022   History of stroke 06/05/2022    Chronic kidney disease, stage 3b ([place]) 06/05/2022   Uncontrolled hypertension 12/30/2021   Glaucoma 12/29/2021   Acute ischemic stroke ([place]) 12/28/2021   Chronic pain of right knee 01/15/2020   Unilateral primary osteoarthritis, left knee 01/25/2018   Unilateral primary osteoarthritis, right knee 01/25/2018   Chronic pain of both knees 01/25/2018   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[place]) 12/22/2010    PCP: Lawerance Cruel, MD   REFERRING PROVIDER: Lawerance Cruel, MD   REFERRING DIAG: Cervicalgia [M54.2]   THERAPY DIAG:  Cervicalgia  Abnormal posture  Muscle weakness (generalized)  Rationale for Evaluation and Treatment: Rehabilitation  ONSET DATE: 3+ years ago  SUBJECTIVE:  SUBJECTIVE STATEMENT: Pt's daughter states, "she wants to work on her balance today."  Patient arrives with collar on today.  Reports she went to Urgent Care on "Sunday b/c the pain was so bad.  They gave me a shot and that helped.   Complains of posterior head pain today especially with looking down (sharp pain when look down at the sink.  She reports she feels like she is falling backwards with sit to stand.      PERTINENT HISTORY:  Per eval:   wearing a neck brace on and off for 3 years. Pt presents with significant bruising on her face due to a recent fall in her bathroom. Her son states that she had a stroke on her L side  in June 2023, and a heart attack in November 2023. Pt states that she does not fall frequently but does have some difficulty with transitions. Pt reports having a pinched nerve in her R side that has been causing her pain for 3+ years. She has seen a chiropractor for her neck, with no relief. Pt uses a quad cane, but did not have it present today.     Uncontrolled HTN, history of a mild  stroke in June with no residual effects , Non- ST elevation MI in November 2023 glaucoma PAIN:  Are you having pain?  No pain right now    PRECAUTIONS: Fall  WEIGHT BEARING RESTRICTIONS: No  FALLS:  Has patient fallen in last 6 months? Yes. Number of falls 2  LIVING ENVIRONMENT: Lives with: lives with their son Lives in: House/apartment Stairs: Yes: Internal: 12 steps; on right going up and External: 2 steps; on right going up Has following equipment at home: Quad cane large base, Walker - 4 wheeled, and shower chair  OCCUPATION: Retired   PLOF: Independent  PATIENT GOALS: Pt would like to reduce the pain in her neck.   OBJECTIVE:  Cervical ROM AROM  07/15/2022  Flexion 15 Sharp pain  Extension 10  Side bending L  10  Side bending R 7  Rotation L  15  Rotation R 12   Full UE mobility with no pain reported  FUNCTIONAL TESTS:  07/21/2022: 5 times sit to/from stand:  31.7 sec with use of UE and legs against back of chair Timed Up and Go:  25.1 sec with CGA of PT  TODAY'S TREATMENT:  08/05/2022: Nustep Level 2 x7 min with PT present to discuss status Sit to stand from mat table no hands 5x Standing hip abduction 5x  right/left Standing hip extension 10x right/left Standing heel raises 10x Standing single arm reach overhead alternating  Side stepping at the counter 3 laps Standing at counter marching 20x   Small push ups at the counter 10x Sit to stand from standard chair 5x no hands 5x Manual therapy: seated in chair gentle cervical distraction, soft tissue mobilization to cervical paraspinals and suboccipitals; grade 1 C2/3 cervical rotation oscillations      01"$ /09/2022: Nustep Level 2 x6 min with PT present to discuss status Seated cervical rotation and extension x20 each Seated cervical retraction 2x10 (pt requires tactile, verbal and visual cues). Sit to/from stand x5 reps Seated shoulder ER and horizontal abduction with yellow tband 2x10 each Standing  shoulder flexion and abduction with 1# dumbbells 2x10 bilat each Standing at counter marching 40x  Standing at counter hip flexion/abduction 10, bilat    07/21/2022: Nustep Level 1 x6 min with PT present to discuss status Seated cervical rotation and extension  x20 each Seated cervical retraction 2x10 Sit to/from stand x5 reps TUG Seated shoulder ER and horizontal abduction with yellow tband 2x10 each Standing shoulder flexion and abduction with 1# dumbbells 2x10 bilat each Manual therapy:  soft tissue mobilization to bilateral cervical paraspinals and upper traps    Patient Education: Patient issued HEP and educated on daily performance.  Pt provided with printed HEP.  Patient returned demonstration and verbalizes understanding.   HOME EXERCISE PLAN:  Access Code: [email or web address] URL: [email or web address] Date: 07/15/2022 Prepared by: Rudi Heap  Exercises - Supine Chin Tuck  - 2 x daily - 7 x weekly - 2 sets - 10 reps - 2 hold - Supine Cervical Rotation AROM on Pillow  - 2 x daily - 7 x weekly - 2 sets - 10 reps - 2 hold  ASSESSMENT:  CLINICAL IMPRESSION:  Ex's initially performed with cervical collar on at her request but then 1/2 through treatment session she feels she can remove the collar for the remaining ex's.  She had one incidence of falling back into the chair with sit to stand but otherwise she was able to perform remaining reps without loss of balance.  She denies head pain during or following treatment session.  Hand held assist provided when ambulating in the clinic as patient does not have her QC today.     OBJECTIVE IMPAIRMENTS: decreased activity tolerance, decreased shoulder mobility, decreased ROM, decreased strength, impaired flexibility, impaired UE use, postural dysfunction, and pain.  ACTIVITY LIMITATIONS: reaching, lifting, carry,  cleaning, driving, and or occupation  PERSONAL FACTORS:  also affecting patient's functional outcome.  REHAB  POTENTIAL: Good  CLINICAL DECISION MAKING: Stable/uncomplicated  EVALUATION COMPLEXITY: Low    GOALS: Short term PT Goals Target date:  08/26/2022 Pt will be I and compliant with HEP. Baseline:  Goal status: ONGOING  Pt will decrease pain by 25% overall Baseline: Goal status: New  Long term PT goals Target date: 09/17/2022 Pt will improve cervical AROM by 5 degrees in all directions to improve cervical movements with walking.  Baseline: Goal status: New  Pt will be able to ambulate 1068f with an appropriate AD in order to run errands.  Baseline: Goal status: New  Pt will report <2 falls throughout her duration of PT.  Baseline: Goal status: New  Pt will reduce pain to overall less than 3/10 with usual activity and work activity. Baseline: Goal status: New       5. Pt will be independent with a comprehensive HEP prior to discharge.  Baseline: Goal Status:  NEW  PLAN: PT FREQUENCY: 2x  PT DURATION: 8 weeks  PLANNED INTERVENTIONS (unless contraindicated): aquatic PT, Canalith repositioning, cryotherapy, Electrical stimulation, Iontophoresis with 4 mg/ml dexamethasome, Moist heat, traction, Ultrasound, gait training, Therapeutic exercise, balance training, neuromuscular re-education, patient/family education, prosthetic training, manual techniques, passive ROM, dry needling, taping, vasopnuematic device, vestibular, spinal manipulations, joint manipulations  PLAN FOR NEXT SESSION: review HEP, manual therapy;  encourage to use QC  SRuben Im PT 08/05/22 4:27 PM Phone: [phone]: [phone]

## 2022-08-07 ENCOUNTER — Ambulatory Visit (INDEPENDENT_AMBULATORY_CARE_PROVIDER_SITE_OTHER): Payer: Medicare Other

## 2022-08-07 ENCOUNTER — Other Ambulatory Visit (HOSPITAL_BASED_OUTPATIENT_CLINIC_OR_DEPARTMENT_OTHER): Payer: Self-pay

## 2022-08-07 ENCOUNTER — Ambulatory Visit (INDEPENDENT_AMBULATORY_CARE_PROVIDER_SITE_OTHER): Payer: Medicare Other | Admitting: Orthopaedic Surgery

## 2022-08-07 DIAGNOSIS — W19XXXD Unspecified fall, subsequent encounter: Secondary | ICD-10-CM

## 2022-08-07 DIAGNOSIS — M79642 Pain in left hand: Secondary | ICD-10-CM | POA: Diagnosis not present

## 2022-08-07 DIAGNOSIS — M1712 Unilateral primary osteoarthritis, left knee: Secondary | ICD-10-CM | POA: Diagnosis not present

## 2022-08-07 DIAGNOSIS — M1711 Unilateral primary osteoarthritis, right knee: Secondary | ICD-10-CM

## 2022-08-07 DIAGNOSIS — M542 Cervicalgia: Secondary | ICD-10-CM

## 2022-08-07 DIAGNOSIS — M25561 Pain in right knee: Secondary | ICD-10-CM | POA: Diagnosis not present

## 2022-08-07 DIAGNOSIS — M170 Bilateral primary osteoarthritis of knee: Secondary | ICD-10-CM

## 2022-08-07 MED ORDER — METHOCARBAMOL 500 MG PO TABS
500.0000 mg | ORAL_TABLET | Freq: Three times a day (TID) | ORAL | 3 refills | Status: DC
  Filled 2022-08-07: qty 30, 10d supply, fill #0

## 2022-08-07 MED ORDER — LIDOCAINE HCL 1 % IJ SOLN
4.0000 mL | INTRAMUSCULAR | Status: AC | PRN
  Administered 2022-08-07: 4 mL

## 2022-08-07 MED ORDER — TRIAMCINOLONE ACETONIDE 40 MG/ML IJ SUSP
80.0000 mg | INTRAMUSCULAR | Status: AC | PRN
  Administered 2022-08-07: 80 mg via INTRA_ARTICULAR

## 2022-08-07 NOTE — Progress Notes (Signed)
Chief Complaint: Left knee pain     History of Present Illness:   08/07/2022: Presents today as she is experiencing ongoing right knee pain that is nearly equal to the left knee pain.  She states that this has been atraumatic and worsening over the last several months.  She is also continuing to have persistent neck pain with difficulty flexing the neck.  This is sore but does not have any radiating symptoms down the arms.  Laurie [name] is a 89 y.o. female presents today with atraumatic left hand that has been going on for approximately 1 day.  She presented to the emergency room and x-rays were obtained which did not show any fracture.  She did not have any specific injury.  She is complaining of diffuse knee pain and swelling.   Surgical History:   None  PMH/PSH/Family History/Social History/Meds/Allergies:    Past Medical History:  Diagnosis Date   CKD (chronic kidney disease), stage III ([place])    Family history of breast cancer    mother   Family history of colon cancer    father   Glaucoma    Osteopenia    SBO (small bowel obstruction) ([place])    Past Surgical History:  Procedure Laterality Date   APPENDECTOMY     CESAREAN SECTION     x6   CORONARY STENT INTERVENTION N/A 06/08/2022   Procedure: CORONARY STENT INTERVENTION;  Surgeon: Troy Sine, MD;  Location: [facility];  Service: Cardiovascular;  Laterality: N/A;   ESOPHAGOGASTRODUODENOSCOPY  07/07/2012   Procedure: ESOPHAGOGASTRODUODENOSCOPY (EGD);  Surgeon: Arta Silence, MD;  Location: [facility] ENDOSCOPY;  Service: Endoscopy;  Laterality: N/A;   GLAUCOMA SURGERY Bilateral 02/17/2022   LEFT COLECTOMY  04/30/2011   Sr Streck   LEFT HEART CATH AND CORONARY ANGIOGRAPHY N/A 06/08/2022   Procedure: LEFT HEART CATH AND CORONARY ANGIOGRAPHY;  Surgeon: Troy Sine, MD;  Location: [facility];  Service: Cardiovascular;  Laterality: N/A;   tubal cyst  04/30/2011   L  fallopian tube cyst incidentally found   Social History   Socioeconomic History   Marital status: Widowed    Spouse name: Not on file   Number of children: Not on file   Years of education: Not on file   Highest education level: Not on file  Occupational History   Not on file  Tobacco Use   Smoking status: Former    Packs/day: 0.50    Types: Cigarettes   Smokeless tobacco: Not on file  Vaping Use   Vaping Use: Not on file  Substance and Sexual Activity   Alcohol use: No    Comment: rare   Drug use: No   Sexual activity: Not Currently  Other Topics Concern   Not on file  Social History Narrative   Not on file   Social Determinants of Health   Financial Resource Strain: Not on file  Food Insecurity: No Food Insecurity (06/09/2022)   Hunger Vital Sign    Worried About Running Out of Food in the Last Year: Never true    Ran Out of Food in the Last Year: Never true  Transportation Needs: No Transportation Needs (06/09/2022)   PRAPARE - Hydrologist (Medical): No    Lack of Transportation (Non-Medical): No  Physical  Activity: Not on file  Stress: Not on file  Social Connections: Not on file   Family History  Problem Relation Age of Onset   Breast cancer Mother    Colon cancer Father    Diabetes Sister    Allergies  Allergen Reactions   Actonel [Risedronate] Nausea And Vomiting   Fosamax [Alendronate] Nausea And Vomiting   Miacalcin Other (See Comments)    Unknown reaction   Neomycin Other (See Comments)    Unknown reaction   Neosporin [Neomycin-Polymyxin-Gramicidin] Swelling   Neurontin [Gabapentin] Other (See Comments)    Confusion   Codeine Palpitations   Current Outpatient Medications  Medication Sig Dispense Refill   methocarbamol (ROBAXIN) 500 MG tablet Take 1 tablet (500 mg total) by mouth 3 (three) times daily. 30 tablet 3   acetaminophen (TYLENOL) 500 MG tablet Take 1,000 mg by mouth every 6 (six) hours as needed.      amLODipine (NORVASC) 2.5 MG tablet Take 2.5 mg by mouth daily.     aspirin EC 81 MG tablet Take 1 tablet (81 mg total) by mouth daily. Swallow whole. 30 tablet 12   atorvastatin (LIPITOR) 40 MG tablet Take 1 tablet (40 mg total) by mouth daily. 30 tablet 11   brimonidine (ALPHAGAN) 0.2 % ophthalmic solution Place 1 drop into both eyes 3 (three) times daily.     dorzolamide-timolol (COSOPT) 22.3-6.8 MG/ML ophthalmic solution 1 drop 2 (two) times daily.       latanoprost (XALATAN) 0.005 % ophthalmic solution Place 1 drop into both eyes every evening.     metoprolol tartrate (LOPRESSOR) 25 MG tablet Take 0.5 tablets (12.5 mg total) by mouth 2 (two) times daily. 60 tablet 2   nitroGLYCERIN (NITROSTAT) 0.4 MG SL tablet Place 1 tablet (0.4 mg total) under the tongue every 5 (five) minutes as needed for chest pain. 30 tablet 12   OPTIVE 0.5-0.9 % ophthalmic solution Place 1 drop into both eyes 4 (four) times daily as needed for dry eyes.     pantoprazole (PROTONIX) 40 MG tablet Take 1 tablet (40 mg total) by mouth daily. 30 tablet 0   RHOPRESSA 0.02 % SOLN Place 1 drop into both eyes every evening.     ticagrelor (BRILINTA) 90 MG TABS tablet Take 1 tablet (90 mg total) by mouth 2 (two) times daily. 60 tablet 2   Travoprost, BAK Free, (TRAVATAMN) 0.004 % SOLN ophthalmic solution Place 1 drop into both eyes at bedtime.      No current facility-administered medications for this visit.   No results found.  Review of Systems:   A ROS was performed including pertinent positives and negatives as documented in the HPI.  Physical Exam :   Constitutional: NAD and appears stated age Neurological: Alert and oriented Psych: Appropriate affect and cooperative There were no vitals taken for this visit.   Comprehensive Musculoskeletal Exam:    Tenderness about the right knee diffusely.  There is effusion.  Range of motion is from 0 to 90 degrees with some pain.  Imaging:   Xray (3 views right shoulder, 2  views right ribs): Normal  X-ray right knee 4 views; There is calcification of bilateral menisci consistent with pseudogout in the setting of tricompartmental osteoarthritis  I personally reviewed and interpreted the radiographs.   Assessment:   89 y.o. female with with right knee pseudogout as well as tricompartmental osteoarthritis.  At today's visit I did recommend an ultrasound-guided injection of the right knee as she got significant relief from  a left knee injection.  She would like to proceed with this.  She is still having persistent neck pain and to that effect I do believe she would benefit from physical therapy and specifically aquatic therapy to reestablish her gait as well as to work on her neck.  I do believe she would even be a candidate for driving billing of her back.  I will plan to see her back as needed Plan :    -Right knee ultrasound-guided injection performed after verbal consent obtained    Procedure Note  Patient: Laurie [name]             Date of Birth: [birth year]           MRN: [id number]             Visit Date: 08/07/2022  Procedures: Visit Diagnoses:  1. Fall, subsequent encounter     Large Joint Inj: R knee on 08/07/2022 12:26 PM Indications: pain Details: 22 G 1.5 in needle, ultrasound-guided anterior approach  Arthrogram: No  Medications: 4 mL lidocaine 1 %; 80 mg triamcinolone acetonide 40 MG/ML Outcome: tolerated well, no immediate complications Procedure, treatment alternatives, risks and benefits explained, specific risks discussed. Consent was given by the patient. Immediately prior to procedure a time out was called to verify the correct patient, procedure, equipment, support staff and site/side marked as required. Patient was prepped and draped in the usual sterile fashion.        I personally saw and evaluated the patient, and participated in the management and treatment plan.  Vanetta Mulders, MD Attending Physician, Orthopedic  Surgery  This document was dictated using Dragon voice recognition software. A reasonable attempt at proof reading has been made to minimize errors.

## 2022-08-13 ENCOUNTER — Ambulatory Visit: Payer: Medicare Other | Admitting: Rehabilitative and Restorative Service Providers"

## 2022-08-13 ENCOUNTER — Encounter: Payer: Self-pay | Admitting: Rehabilitative and Restorative Service Providers"

## 2022-08-13 DIAGNOSIS — M542 Cervicalgia: Secondary | ICD-10-CM | POA: Diagnosis not present

## 2022-08-13 DIAGNOSIS — M6281 Muscle weakness (generalized): Secondary | ICD-10-CM | POA: Diagnosis not present

## 2022-08-13 DIAGNOSIS — R293 Abnormal posture: Secondary | ICD-10-CM | POA: Diagnosis not present

## 2022-08-13 DIAGNOSIS — R262 Difficulty in walking, not elsewhere classified: Secondary | ICD-10-CM | POA: Diagnosis not present

## 2022-08-13 NOTE — Therapy (Signed)
OUTPATIENT PHYSICAL THERAPY TREATMENT NOTE   Patient Name: Laurie [name] MRN: [id number] DOB:[birth year], 89 y.o., female Today's Date: 08/13/2022  END OF SESSION:  PT End of Session - 08/13/22 1237     Visit Number 6    Date for PT Re-Evaluation 09/17/22    Authorization Type [employer] MEDICARE    Progress Note Due on Visit 10    PT Start Time 1234    PT Stop Time 1312    PT Time Calculation (min) 38 min    Activity Tolerance Patient tolerated treatment well    Behavior During Therapy WFL for tasks assessed/performed              Past Medical History:  Diagnosis Date   CKD (chronic kidney disease), stage III ([place])    Family history of breast cancer    mother   Family history of colon cancer    father   Glaucoma    Osteopenia    SBO (small bowel obstruction) ([place])    Past Surgical History:  Procedure Laterality Date   APPENDECTOMY     CESAREAN SECTION     x6   CORONARY STENT INTERVENTION N/A 06/08/2022   Procedure: CORONARY STENT INTERVENTION;  Surgeon: Troy Sine, MD;  Location: [place] [facility];  Service: Cardiovascular;  Laterality: N/A;   ESOPHAGOGASTRODUODENOSCOPY  07/07/2012   Procedure: ESOPHAGOGASTRODUODENOSCOPY (EGD);  Surgeon: Arta Silence, MD;  Location: [facility] ENDOSCOPY;  Service: Endoscopy;  Laterality: N/A;   GLAUCOMA SURGERY Bilateral 02/17/2022   LEFT COLECTOMY  04/30/2011   Sr Streck   LEFT HEART CATH AND CORONARY ANGIOGRAPHY N/A 06/08/2022   Procedure: LEFT HEART CATH AND CORONARY ANGIOGRAPHY;  Surgeon: Troy Sine, MD;  Location: [place] [facility];  Service: Cardiovascular;  Laterality: N/A;   tubal cyst  04/30/2011   L fallopian tube cyst incidentally found   Patient Active Problem List   Diagnosis Date Noted   Non-ST elevation (NSTEMI) myocardial infarction ([place]) 06/06/2022   Elevated troponin 06/05/2022   Hiatal hernia 06/05/2022   Elevated d-dimer 06/05/2022   Abnormal urinalysis 06/05/2022   HLD (hyperlipidemia) 06/05/2022    History of stroke 06/05/2022   Chronic kidney disease, stage 3b ([place]) 06/05/2022   Uncontrolled hypertension 12/30/2021   Glaucoma 12/29/2021   Acute ischemic stroke ([place]) 12/28/2021   Chronic pain of right knee 01/15/2020   Unilateral primary osteoarthritis, left knee 01/25/2018   Unilateral primary osteoarthritis, right knee 01/25/2018   Chronic pain of both knees 01/25/2018   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[place]) 12/22/2010    PCP: Lawerance Cruel, MD   REFERRING PROVIDER: Lawerance Cruel, MD   REFERRING DIAG: Cervicalgia [M54.2]   THERAPY DIAG:  Cervicalgia  Abnormal posture  Muscle weakness (generalized)  Difficulty in walking, not elsewhere classified  Rationale for Evaluation and Treatment: Rehabilitation  ONSET DATE: 3+ years ago  SUBJECTIVE:  SUBJECTIVE STATEMENT: Pt reports that she is having neck pain today.    PERTINENT HISTORY:  Per eval:   wearing a neck brace on and off for 3 years. Pt presents with significant bruising on her face due to a recent fall in her bathroom. Her son states that she had a stroke on her L side  in June 2023, and a heart attack in November 2023. Pt states that she does not fall frequently but does have some difficulty with transitions. Pt reports having a pinched nerve in her R side that has been causing her pain for 3+ years. She has seen a chiropractor for her neck, with no relief. Pt uses a quad cane, but did not have it present today.     Uncontrolled HTN, history of a mild stroke in June with no residual effects , Non- ST elevation MI in November 2023 glaucoma PAIN:  Are you having pain?  YES Cervical pain of 5/10    PRECAUTIONS: Fall  WEIGHT BEARING RESTRICTIONS: No  FALLS:  Has patient fallen in last 6 months? Yes.  Number of falls 2  LIVING ENVIRONMENT: Lives with: lives with their son Lives in: House/apartment Stairs: Yes: Internal: 12 steps; on right going up and External: 2 steps; on right going up Has following equipment at home: Quad cane large base, Walker - 4 wheeled, and shower chair  OCCUPATION: Retired   PLOF: Independent  PATIENT GOALS: Pt would like to reduce the pain in her neck.   OBJECTIVE:  Cervical ROM AROM  07/15/2022  Flexion 15 Sharp pain  Extension 10  Side bending L  10  Side bending R 7  Rotation L  15  Rotation R 12   Full UE mobility with no pain reported  FUNCTIONAL TESTS:  07/21/2022: 5 times sit to/from stand:  31.7 sec with use of UE and legs against back of chair Timed Up and Go:  25.1 sec with CGA of PT  08/13/2022: 3 minute walk:  195 ft with CGA  TODAY'S TREATMENT:   08/13/2022: Cervical rotation and extension x10 each Cerivcal isometrics with PT assist x10 each way Seated scapular retraction x10 Sit to/from stand from chair 2x5 (requires UE use) Standing shoulder flexion and abduction with 1# dumbbells 2x10 bilat each (with seated recovery period in between sets) 3 minute ambulation:  195 ft with CGA Nustep Level 3 x6 min with PT present to discuss status Seated active assistive cervical rotation to assist with increased ROM x10 bilat Seated active assistive posterior shoulder rolls x10 bilat   08/05/2022: Nustep Level 2 x7 min with PT present to discuss status Sit to stand from mat table no hands 5x Standing hip abduction 5x  right/left Standing hip extension 10x right/left Standing heel raises 10x Standing single arm reach overhead alternating  Side stepping at the counter 3 laps Standing at counter marching 20x   Small push ups at the counter 10x Sit to stand from standard chair 5x no hands 5x Manual therapy: seated in chair gentle cervical distraction, soft tissue mobilization to cervical paraspinals and suboccipitals; grade 1 C2/3  cervical rotation oscillations      07/29/2022: Nustep Level 2 x6 min with PT present to discuss status Seated cervical rotation and extension x20 each Seated cervical retraction 2x10 (pt requires tactile, verbal and visual cues). Sit to/from stand x5 reps Seated shoulder ER and horizontal abduction with yellow tband 2x10 each Standing shoulder flexion and abduction with 1# dumbbells 2x10 bilat each Standing at counter  marching 40x  Standing at counter hip flexion/abduction 10, bilat      Patient Education: Patient issued HEP and educated on daily performance.  Pt provided with printed HEP.  Patient returned demonstration and verbalizes understanding.   HOME EXERCISE PLAN:  Access Code: [email or web address] URL: [email or web address] Date: 08/13/2022 Prepared by: Shelby Dubin Toddy Boyd  Exercises - Supine Chin Tuck  - 2 x daily - 7 x weekly - 2 sets - 10 reps - 2 hold - Supine Cervical Rotation AROM on Pillow  - 2 x daily - 7 x weekly - 2 sets - 10 reps - 2 hold - Supine Cervical Sidebending Stretch  - 1 x daily - 7 x weekly - 1 sets - 3 reps - 10 hold - Seated Cervical Sidebending AROM  - 1 x daily - 7 x weekly - 1 sets - 3 reps - 10 hold - Seated Scapular Retraction  - 1 x daily - 7 x weekly - 1 sets - 10 reps - Seated March  - 1 x daily - 7 x weekly - 1 sets - 10 reps - Seated Shoulder Flexion  - 1 x daily - 7 x weekly - 1 sets - 10 reps - Seated Hip Adduction Isometrics with Ball  - 1 x daily - 7 x weekly - 1 sets - 10 reps - Sit to Stand with Armchair  - 1 x daily - 7 x weekly - 2 sets - 5 reps  ASSESSMENT:  CLINICAL IMPRESSION:  Ms Hacking presents to skilled PT denying any new falls and reporting compliance with HEP.  Patient with difficulty with seated cervical A/ROM, so at end of session, provided tactile cuing for AA/ROM for cervical rotation and shoulder rolls.  Patient reports feeling better after PT session and pain decreasing to 2/10.  Patient with slow gait pattern  and able to achieve 195 ft with 3 min walk test with holding CGA.  Patient without a loss of balance throughout session, but PT did provide CGA during ambulation around clinic.  All short-term goals met at this time.   OBJECTIVE IMPAIRMENTS: decreased activity tolerance, decreased shoulder mobility, decreased ROM, decreased strength, impaired flexibility, impaired UE use, postural dysfunction, and pain.  ACTIVITY LIMITATIONS: reaching, lifting, carry,  cleaning, driving, and or occupation  PERSONAL FACTORS:  also affecting patient's functional outcome.  REHAB POTENTIAL: Good  CLINICAL DECISION MAKING: Stable/uncomplicated  EVALUATION COMPLEXITY: Low    GOALS: Short term PT Goals Target date:  08/05/2022 Pt will be I and compliant with HEP. Baseline:  Goal status: Met  Pt will decrease pain by 25% overall Baseline: Goal status: MET on 08/13/2022  Long term PT goals Target date: 09/17/2022  Pt will improve cervical AROM by 5 degrees in all directions to improve cervical movements with walking.  Baseline: Goal status: New  Pt will be able to ambulate 1086f with an appropriate AD in order to run errands.  Baseline: Goal status: In progress  Pt will report <2 falls throughout her duration of PT.  Baseline: Goal status: New  Pt will reduce pain to overall less than 3/10 with usual activity and work activity. Baseline: Goal status: New       5. Pt will be independent with a comprehensive HEP prior to discharge.  Baseline: Goal Status:  NEW  PLAN: PT FREQUENCY: 2x  PT DURATION: 8 weeks  PLANNED INTERVENTIONS (unless contraindicated): aquatic PT, Canalith repositioning, cryotherapy, Electrical stimulation, Iontophoresis with 4 mg/ml dexamethasome, Moist heat, traction, Ultrasound, gait training, Therapeutic exercise, balance  training, neuromuscular re-education, patient/family education, prosthetic training, manual techniques, passive ROM, dry needling, taping,  vasopnuematic device, vestibular, spinal manipulations, joint manipulations  PLAN FOR NEXT SESSION: review HEP, manual therapy; progress strengthening and range of motion, encourage to use QC    Juel Burrow, PT 08/13/22 1:30 PM  [facility] [address] [place], [place] [address] Phone # [phone] Fax [phone]

## 2022-08-17 ENCOUNTER — Encounter: Payer: Self-pay | Admitting: Podiatry

## 2022-08-17 ENCOUNTER — Ambulatory Visit: Payer: Medicare Other | Admitting: Podiatry

## 2022-08-17 DIAGNOSIS — H353131 Nonexudative age-related macular degeneration, bilateral, early dry stage: Secondary | ICD-10-CM | POA: Diagnosis not present

## 2022-08-17 DIAGNOSIS — L603 Nail dystrophy: Secondary | ICD-10-CM | POA: Diagnosis not present

## 2022-08-17 DIAGNOSIS — H401133 Primary open-angle glaucoma, bilateral, severe stage: Secondary | ICD-10-CM | POA: Diagnosis not present

## 2022-08-17 DIAGNOSIS — L909 Atrophic disorder of skin, unspecified: Secondary | ICD-10-CM

## 2022-08-17 NOTE — Progress Notes (Signed)
  Subjective:  Patient ID: Laurie [name], female    DOB: [birth year],   MRN: [id number]  Chief Complaint  Patient presents with   Nail Problem    Nail trim    89 y.o. female presents for concern of thickened elongated and painful toenails. Difficult to trim herself. She is not diabetic and not on blood thinners. Also relates pain in the ball of her foot and concerned there may be callus  . Denies any other pedal complaints. Denies n/v/f/c.   Past Medical History:  Diagnosis Date   CKD (chronic kidney disease), stage III ([facility])    Family history of breast cancer    mother   Family history of colon cancer    father   Glaucoma    Osteopenia    SBO (small bowel obstruction) ([facility])     Objective:  Physical Exam: Vascular: DP/PT pulses 2/4 bilateral. CFT <3 seconds. Normal hair growth on digits. No edema.  Skin. No lacerations or abrasions bilateral feet. Nails 1-5 b/l are thickened elongated and with subungual debris. Diminished fat pad to ball of foot   Musculoskeletal: MMT 5/5 bilateral lower extremities in DF, PF, Inversion and Eversion. Deceased ROM in DF of ankle joint.  Neurological: Sensation intact to light touch.   Assessment:   1. Onychodystrophy   2. Fat pad atrophy of foot      Plan:  Patient was evaluated and treated and all questions answered. -Discussed and educated patient on diabetic foot care, especially with  regards to the vascular, neurological and musculoskeletal systems.  -Discussed supportive shoes at all times and checking feet regularly.  -Mechanically debrided all nails 1-5 bilateral using sterile nail nipper and filed with dremel without incident as courtesy today.  -Padding for ball of foot provided.  -Answered all patient questions -Patient to return as needed.  -Patient advised to call the office if any problems or questions arise in the meantime.   Lorenda Peck, DPM

## 2022-08-19 ENCOUNTER — Ambulatory Visit: Payer: Medicare Other | Admitting: Physical Therapy

## 2022-08-19 DIAGNOSIS — M6281 Muscle weakness (generalized): Secondary | ICD-10-CM | POA: Diagnosis not present

## 2022-08-19 DIAGNOSIS — R293 Abnormal posture: Secondary | ICD-10-CM

## 2022-08-19 DIAGNOSIS — M542 Cervicalgia: Secondary | ICD-10-CM

## 2022-08-19 DIAGNOSIS — R262 Difficulty in walking, not elsewhere classified: Secondary | ICD-10-CM

## 2022-08-19 NOTE — Therapy (Signed)
OUTPATIENT PHYSICAL THERAPY TREATMENT NOTE   Patient Name: Laurie [name] MRN: [id number] DOB:1931-08-13, 89 y.o., female Today's Date: 08/19/2022  END OF SESSION:  PT End of Session - 08/19/22 1442     Visit Number 7    Number of Visits 16    Date for PT Re-Evaluation 09/17/22    Authorization Type [employer] MEDICARE    Progress Note Due on Visit 10    PT Start Time 1445    PT Stop Time 1530    PT Time Calculation (min) 45 min    Activity Tolerance Patient tolerated treatment well              Past Medical History:  Diagnosis Date   CKD (chronic kidney disease), stage III ([place])    Family history of breast cancer    mother   Family history of colon cancer    father   Glaucoma    Osteopenia    SBO (small bowel obstruction) ([place])    Past Surgical History:  Procedure Laterality Date   APPENDECTOMY     CESAREAN SECTION     x6   CORONARY STENT INTERVENTION N/A 06/08/2022   Procedure: CORONARY STENT INTERVENTION;  Surgeon: Troy Sine, MD;  Location: [place] [facility];  Service: Cardiovascular;  Laterality: N/A;   ESOPHAGOGASTRODUODENOSCOPY  07/07/2012   Procedure: ESOPHAGOGASTRODUODENOSCOPY (EGD);  Surgeon: Arta Silence, MD;  Location: [facility] ENDOSCOPY;  Service: Endoscopy;  Laterality: N/A;   GLAUCOMA SURGERY Bilateral 02/17/2022   LEFT COLECTOMY  04/30/2011   Sr Streck   LEFT HEART CATH AND CORONARY ANGIOGRAPHY N/A 06/08/2022   Procedure: LEFT HEART CATH AND CORONARY ANGIOGRAPHY;  Surgeon: Troy Sine, MD;  Location: [place] [facility];  Service: Cardiovascular;  Laterality: N/A;   tubal cyst  04/30/2011   L fallopian tube cyst incidentally found   Patient Active Problem List   Diagnosis Date Noted   Non-ST elevation (NSTEMI) myocardial infarction ([place]) 06/06/2022   Elevated troponin 06/05/2022   Hiatal hernia 06/05/2022   Elevated d-dimer 06/05/2022   Abnormal urinalysis 06/05/2022   HLD (hyperlipidemia) 06/05/2022   History of stroke 06/05/2022    Chronic kidney disease, stage 3b ([place]) 06/05/2022   Uncontrolled hypertension 12/30/2021   Glaucoma 12/29/2021   Acute ischemic stroke ([place]) 12/28/2021   Chronic pain of right knee 01/15/2020   Unilateral primary osteoarthritis, left knee 01/25/2018   Unilateral primary osteoarthritis, right knee 01/25/2018   Chronic pain of both knees 01/25/2018   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[place]) 12/22/2010    PCP: Lawerance Cruel, MD   REFERRING PROVIDER: Lawerance Cruel, MD   REFERRING DIAG: Cervicalgia [M54.2]   THERAPY DIAG:  Cervicalgia  Abnormal posture  Muscle weakness (generalized)  Difficulty in walking, not elsewhere classified  Rationale for Evaluation and Treatment: Rehabilitation  ONSET DATE: 3+ years ago  SUBJECTIVE:  SUBJECTIVE STATEMENT: I had to take some Tylenol and that did the job.  Not wearing neck brace today.  No assistive device.  She denies any recent falls.     PERTINENT HISTORY:  Per eval:   wearing a neck brace on and off for 3 years. Pt presents with significant bruising on her face due to a recent fall in her bathroom. Her son states that she had a stroke on her L side  in June 2023, and a heart attack in November 2023. Pt states that she does not fall frequently but does have some difficulty with transitions. Pt reports having a pinched nerve in her R side that has been causing her pain for 3+ years. She has seen a chiropractor for her neck, with no relief. Pt uses a quad cane, but did not have it present today.     Uncontrolled HTN, history of a mild stroke in June with no residual effects , Non- ST elevation MI in November 2023 glaucoma PAIN:  Are you having pain?  no Cervical pain of 0/10    PRECAUTIONS: Fall  WEIGHT BEARING RESTRICTIONS:  No  FALLS:  Has patient fallen in last 6 months? Yes. Number of falls 2  LIVING ENVIRONMENT: Lives with: lives with their son Lives in: House/apartment Stairs: Yes: Internal: 12 steps; on right going up and External: 2 steps; on right going up Has following equipment at home: Quad cane large base, Walker - 4 wheeled, and shower chair  OCCUPATION: Retired   PLOF: Independent  PATIENT GOALS: Pt would like to reduce the pain in her neck.   OBJECTIVE:  Cervical ROM AROM  07/15/2022 1/24  Flexion 15 Sharp pain   Extension 10   Side bending L  10   Side bending R 7   Rotation L  15 20  Rotation R 12 15   Full UE mobility with no pain reported  FUNCTIONAL TESTS:  07/21/2022: 5 times sit to/from stand:  31.7 sec with use of UE and legs against back of chair Timed Up and Go:  25.1 sec with CGA of PT  08/13/2022: 3 minute walk:  195 ft with CGA  TODAY'S TREATMENT:  08/19/2022: Nustep Level 1 x5 min with PT present to discuss status Reaching UE overhead and to the side for numbers on the wall Railing push ups 10x  Heel and toe raises 10x Side stepping at the railing 2 hand support 4 laps Low cone toe taps alternating 10x Seated 1# dumbbell press overhead 5x2 Standing 1# dumbbell sliding down thighs to knee level (Like taking pants down and pulling them back up) 10x Sit to stand from chair + cushion 10x Sit to stand from standard chair no cushion no hands 3x (unable to get center of gravity forward and pushes chair with backs of knees) Standing red band bil shoulder row/extensions 2x10 Seated red band bil row 10x Manual therapy: seated in chair gentle cervical distraction, soft tissue mobilization to cervical paraspinals and suboccipitals; grade 1 C2/3 cervical rotation mob with movement 10x right/left     08/13/2022: Cervical rotation and extension x10 each Cerivcal isometrics with PT assist x10 each way Seated scapular retraction x10 Sit to/from stand from chair 2x5  (requires UE use) Standing shoulder flexion and abduction with 1# dumbbells 2x10 bilat each (with seated recovery period in between sets) 3 minute ambulation:  195 ft with CGA Nustep Level 3 x6 min with PT present to discuss status Seated active assistive cervical rotation to assist  with increased ROM x10 bilat Seated active assistive posterior shoulder rolls x10 bilat   08/05/2022: Nustep Level 2 x7 min with PT present to discuss status Sit to stand from mat table no hands 5x Standing hip abduction 5x  right/left Standing hip extension 10x right/left Standing heel raises 10x Standing single arm reach overhead alternating  Side stepping at the counter 3 laps Standing at counter marching 20x   Small push ups at the counter 10x Sit to stand from standard chair 5x no hands 5x Manual therapy: seated in chair gentle cervical distraction, soft tissue mobilization to cervical paraspinals and suboccipitals; grade 1 C2/3 cervical rotation oscillations      Patient Education: Patient issued HEP and educated on daily performance.  Pt provided with printed HEP.  Patient returned demonstration and verbalizes understanding.   HOME EXERCISE PLAN:  Access Code: [email or web address] URL: [email or web address] Date: 08/13/2022 Prepared by: Shelby Dubin Menke  Exercises - Supine Chin Tuck  - 2 x daily - 7 x weekly - 2 sets - 10 reps - 2 hold - Supine Cervical Rotation AROM on Pillow  - 2 x daily - 7 x weekly - 2 sets - 10 reps - 2 hold - Supine Cervical Sidebending Stretch  - 1 x daily - 7 x weekly - 1 sets - 3 reps - 10 hold - Seated Cervical Sidebending AROM  - 1 x daily - 7 x weekly - 1 sets - 3 reps - 10 hold - Seated Scapular Retraction  - 1 x daily - 7 x weekly - 1 sets - 10 reps - Seated March  - 1 x daily - 7 x weekly - 1 sets - 10 reps - Seated Shoulder Flexion  - 1 x daily - 7 x weekly - 1 sets - 10 reps - Seated Hip Adduction Isometrics with Ball  - 1 x daily - 7 x weekly - 1 sets - 10  reps - Sit to Stand with Armchair  - 1 x daily - 7 x weekly - 2 sets - 5 reps  ASSESSMENT:  CLINICAL IMPRESSION:  Neck pain well controlled today by a Tylenol per patient report.  Noted improved rotation ROM bilaterally following manual therapy.  With 1 hand support the patient is able to perform reaching and low level dynamic balance ex's with only supervision/CGA for safety.  From a higher seat height she rises easily however from a standard seat height she has difficulty, pushing her legs against the chair causing the chair to slide.  The patient would benefit from a continuation of skilled PT for a further progression of strengthening and functional mobility.  Will continue to update and promote independence in a HEP needed for a return to the highest functional level possible with ADLs.       OBJECTIVE IMPAIRMENTS: decreased activity tolerance, decreased shoulder mobility, decreased ROM, decreased strength, impaired flexibility, impaired UE use, postural dysfunction, and pain.  ACTIVITY LIMITATIONS: reaching, lifting, carry,  cleaning, driving, and or occupation  PERSONAL FACTORS:  also affecting patient's functional outcome.  REHAB POTENTIAL: Good  CLINICAL DECISION MAKING: Stable/uncomplicated  EVALUATION COMPLEXITY: Low    GOALS: Short term PT Goals Target date:  08/05/2022 Pt will be I and compliant with HEP. Baseline:  Goal status: Met  Pt will decrease pain by 25% overall Baseline: Goal status: MET on 08/13/2022  Long term PT goals Target date: 09/17/2022  Pt will improve cervical AROM by 5 degrees in all directions to improve cervical movements with walking.  Baseline:  Goal status: New  Pt will be able to ambulate 1048f with an appropriate AD in order to run errands.  Baseline: Goal status: In progress  Pt will report <2 falls throughout her duration of PT.  Baseline: Goal status: New  Pt will reduce pain to overall less than 3/10 with usual activity and work  activity. Baseline: Goal status: New       5. Pt will be independent with a comprehensive HEP prior to discharge.  Baseline: Goal Status:  NEW  PLAN: PT FREQUENCY: 2x  PT DURATION: 8 weeks  PLANNED INTERVENTIONS (unless contraindicated): aquatic PT, Canalith repositioning, cryotherapy, Electrical stimulation, Iontophoresis with 4 mg/ml dexamethasome, Moist heat, traction, Ultrasound, gait training, Therapeutic exercise, balance training, neuromuscular re-education, patient/family education, prosthetic training, manual techniques, passive ROM, dry needling, taping, vasopnuematic device, vestibular, spinal manipulations, joint manipulations  PLAN FOR NEXT SESSION: recheck cervical ROM next visit, manual therapy; progress strengthening and range of motion, encourage to use QC    SRuben Im PT 08/19/22 6:39 PM Phone: [phone]: [id number] [place] [address]Phone # [phone] [phone]

## 2022-08-19 NOTE — Progress Notes (Unsigned)
[facility] [address] [place]. [place] [address] [phone]       STROKE FOLLOW UP NOTE  [name] Date of Birth:  24-Feb-1932 Medical Record Number:  [id number]   Reason for visit: stroke follow up    SUBJECTIVE:   CHIEF COMPLAINT:  No chief complaint on file.   HPI:   Update 08/20/2022 JM: Patient returns for 47-monthstroke follow-up.  Reports residual ***.  Denies new stroke/TIA symptoms.  Remains on aspirin and atorvastatin Blood pressure well-controlled Routinely follows with PCP.    History provided for reference purposes only Initial visit 02/17/2022 JM: Patient is being seen for initial hospital follow-up accompanied by her son.  Has been doing well since discharge.  Reports residual occasional word finding difficulty. Denies any weakness. Will have some gait difficulty if sitting too long.  Complains of left leg pain which she reports has been present since her hospitalization. Also notes increased fatigue since stroke.  Use of cane with long distance or uneven ground, no recent falls. [facility]. [facility] Lives with her son and daughter lives near by. Able to maintain ADLs independently. Denies new stroke/TIA symptoms.   Is wearing a neck brace for a "pinched nerve" in her neck, reports previously seen by Ortho and was told due to arthritis and "nothing could be done".  Neck brace helps with the pain as it helps to limit/protect cervical ROM.  This is a chronic issue and denies any worsening.  Completed 3 weeks DAPT, remains on aspirin alone as well as atorvastatin, denies side effects. Blood pressures today 135/65 - occasionally monitored at home which has been stable  Has since seen PCP since discharge, has f/u next month  No further concerns at this time  Stroke admission 12/28/2021 [name] a 89y.o. female with history of CKD, osteoporosis, HLD,  squamous cell carcinoma, glaucoma, colon cancer and HTN who presented on 12/28/2021 with acute onset slurred speech.  Personally reviewed hospitalization pertinent progress notes, lab work and imaging.  Evaluated by Dr. SLeonie Manfor small acute infarct in left corona radiata likely secondary to small vessel disease.  MRA head/neck negative LVO, no carotid stenosis or intracranial vessel stenosis.  EF 60 to 65%.  LDL 130.  A1c 5.5.  Recommended DAPT for 3 weeks and aspirin alone as well as initiate atorvastatin 40 mg daily.  No prior stroke history.  Therapy eval's recommended home health PT/OT     PERTINENT IMAGING  Per hospitalizations 02/17/2022 CT head No acute abnormality.  MRI  small acute infarct in left corona radiata, chronic microvascular ischemic changes MRA head and neck no LVO, no carotid stenosis, no intracranial vessel stenosis 2D Echo EF 60-65%, no atrial level shunt LDL 130 HgbA1c 5.5    ROS:   14 system review of systems performed and negative with exception of those listed in HPI  PMH:  Past Medical History:  Diagnosis Date   CKD (chronic kidney disease), stage III ([facility]    Family history of breast cancer    mother   Family history of colon cancer    father   Glaucoma    Osteopenia    SBO (small bowel obstruction) (HCC)     PSH:  Past Surgical History:  Procedure Laterality Date   APPENDECTOMY     CESAREAN SECTION     x6   CORONARY STENT INTERVENTION N/A 06/08/2022  Procedure: CORONARY STENT INTERVENTION;  Surgeon: Troy Sine, MD;  Location: [facility];  Service: Cardiovascular;  Laterality: N/A;   ESOPHAGOGASTRODUODENOSCOPY  07/07/2012   Procedure: ESOPHAGOGASTRODUODENOSCOPY (EGD);  Surgeon: Arta Silence, MD;  Location: [facility] ENDOSCOPY;  Service: Endoscopy;  Laterality: N/A;   GLAUCOMA SURGERY Bilateral 02/17/2022   LEFT COLECTOMY  04/30/2011   Sr Streck   LEFT HEART CATH AND CORONARY ANGIOGRAPHY N/A 06/08/2022   Procedure: LEFT HEART CATH AND  CORONARY ANGIOGRAPHY;  Surgeon: Troy Sine, MD;  Location: [place] [facility];  Service: Cardiovascular;  Laterality: N/A;   tubal cyst  04/30/2011   L fallopian tube cyst incidentally found    Social History:  Social History   Socioeconomic History   Marital status: Widowed    Spouse name: Not on file   Number of children: Not on file   Years of education: Not on file   Highest education level: Not on file  Occupational History   Not on file  Tobacco Use   Smoking status: Former    Packs/day: 0.50    Types: Cigarettes   Smokeless tobacco: Not on file  Vaping Use   Vaping Use: Not on file  Substance and Sexual Activity   Alcohol use: No    Comment: rare   Drug use: No   Sexual activity: Not Currently  Other Topics Concern   Not on file  Social History Narrative   Not on file   Social Determinants of Health   Financial Resource Strain: Not on file  Food Insecurity: No Food Insecurity (06/09/2022)   Hunger Vital Sign    Worried About Running Out of Food in the Last Year: Never true    Ran Out of Food in the Last Year: Never true  Transportation Needs: No Transportation Needs (06/09/2022)   PRAPARE - Hydrologist (Medical): No    Lack of Transportation (Non-Medical): No  Physical Activity: Not on file  Stress: Not on file  Social Connections: Not on file  Intimate Partner Violence: Not At Risk (06/09/2022)   Humiliation, Afraid, Rape, and Kick questionnaire    Fear of Current or Ex-Partner: No    Emotionally Abused: No    Physically Abused: No    Sexually Abused: No    Family History:  Family History  Problem Relation Age of Onset   Breast cancer Mother    Colon cancer Father    Diabetes Sister     Medications:   Current Outpatient Medications on File Prior to Visit  Medication Sig Dispense Refill   acetaminophen (TYLENOL) 500 MG tablet Take 1,000 mg by mouth every 6 (six) hours as needed.     amLODipine (NORVASC) 2.5 MG  tablet Take 2.5 mg by mouth daily.     aspirin EC 81 MG tablet Take 1 tablet (81 mg total) by mouth daily. Swallow whole. 30 tablet 12   atorvastatin (LIPITOR) 40 MG tablet Take 1 tablet (40 mg total) by mouth daily. 30 tablet 11   brimonidine (ALPHAGAN) 0.2 % ophthalmic solution Place 1 drop into both eyes 3 (three) times daily.     dorzolamide-timolol (COSOPT) 22.3-6.8 MG/ML ophthalmic solution 1 drop 2 (two) times daily.       latanoprost (XALATAN) 0.005 % ophthalmic solution Place 1 drop into both eyes every evening.     methocarbamol (ROBAXIN) 500 MG tablet Take 1 tablet (500 mg total) by mouth 3 (three) times daily. 30 tablet 3   metoprolol  tartrate (LOPRESSOR) 25 MG tablet Take 0.5 tablets (12.5 mg total) by mouth 2 (two) times daily. 60 tablet 2   nitroGLYCERIN (NITROSTAT) 0.4 MG SL tablet Place 1 tablet (0.4 mg total) under the tongue every 5 (five) minutes as needed for chest pain. 30 tablet 12   OPTIVE 0.5-0.9 % ophthalmic solution Place 1 drop into both eyes 4 (four) times daily as needed for dry eyes.     pantoprazole (PROTONIX) 40 MG tablet Take 1 tablet (40 mg total) by mouth daily. 30 tablet 0   RHOPRESSA 0.02 % SOLN Place 1 drop into both eyes every evening.     ticagrelor (BRILINTA) 90 MG TABS tablet Take 1 tablet (90 mg total) by mouth 2 (two) times daily. 60 tablet 2   Travoprost, BAK Free, (TRAVATAMN) 0.004 % SOLN ophthalmic solution Place 1 drop into both eyes at bedtime.      No current facility-administered medications on file prior to visit.    Allergies:   Allergies  Allergen Reactions   Actonel [Risedronate] Nausea And Vomiting   Fosamax [Alendronate] Nausea And Vomiting   Miacalcin Other (See Comments)    Unknown reaction   Neomycin Other (See Comments)    Unknown reaction   Neosporin [Neomycin-Polymyxin-Gramicidin] Swelling   Neurontin [Gabapentin] Other (See Comments)    Confusion   Codeine Palpitations      OBJECTIVE:  Physical Exam  There were no  vitals filed for this visit.  There is no height or weight on file to calculate BMI. No results found.   General: well developed, well nourished, very pleasant elderly Caucasian female, seated, in no evident distress with soft neck brace intact Head: head normocephalic and atraumatic.   Neck: supple with no carotid or supraclavicular bruits Cardiovascular: regular rate and rhythm, no murmurs Musculoskeletal: no deformity; decreased LLE ROM 2/2 pain Skin:  no rash/petichiae Vascular:  Normal pulses all extremities   Neurologic Exam Mental Status: Awake and fully alert.  Mild dysarthria with occasional speech hesitancy.  Able to follow commands without difficulty.  Oriented to place and time. Recent memory mildly impaired and remote memory intact. Attention span, concentration and fund of knowledge appropriate during visit. Mood and affect appropriate.  Cranial Nerves: Pupils equal, briskly reactive to light. Extraocular movements full without nystagmus. Visual fields full to confrontation. Hearing intact. Facial sensation intact. Face, tongue, palate moves normally and symmetrically.  Motor: Normal bulk and tone. Normal strength in all tested extremity muscles Sensory.: intact to touch , pinprick , position and vibratory sensation.  Coordination: Rapid alternating movements normal in all extremities. Finger-to-nose and heel-to-shin performed accurately bilaterally. Gait and Station: Arises from chair with with mild difficulty. Stance is slightly hunched. Gait demonstrates decreased stride length and step height bilaterally with mild imbalance and assisted by son (did not bring cane into office).  Tandem walk and heel toe not attempted.  Reflexes: 1+ and symmetric. Toes downgoing.        ASSESSMENT: Laurie [name] is a 89 y.o. year old female with left corona radiata infarct on 12/28/2021 likely secondary to small vessel disease. Vascular risk factors include HTN, HLD, advanced age and  former tobacco use.      PLAN:  Left CR stroke:  Residual deficit: Mild dysarthria and occasional aphasia and gait impairment. Cleared by [facility]. Plans on initial evaluation with OT soon.  Discussed changes sethi to outpatient therapies once completed if indicated.  Discussed use of cane at all times unless otherwise instructed.  Discussed typical  recovery time. Continue aspirin 81 mg daily  and atorvastatin 40 mg daily for secondary stroke prevention.   Discussed secondary stroke prevention measures and importance of close PCP follow up for aggressive stroke risk factor management including BP goal<130/90, HLD with LDL goal<70 and DM with A1c.<7 .  Stroke labs 12/2021: LDL 130, A1c 5.5 I have gone over the pathophysiology of stroke, warning signs and symptoms, risk factors and their management in some detail with instructions to go to the closest emergency room for symptoms of concern. Left leg pain: Suspect more musculoskeletal in etiology. Good strength but was guarded and decreased ROM 2/2 pain.  LE ultrasound negative for DVT.  Advised to f/u with PCP if symptoms persist or interfere with functioning    Follow up in 6 months or call earlier if needed    CC:  PCP: Lawerance Cruel, MD    I spent 56 minutes of face-to-face and non-face-to-face time with patient and son.  This included previsit chart review, lab review, study review, electronic health record documentation, patient and son education and discussion regarding above diagnoses and treatment plan and answered all other questions to patient and son satisfaction   Frann Rider, [facility]  [facility] [address] [place], [place] [address]  Phone [phone] Fax [phone] Note: This document was prepared with digital dictation and possible smart phrase technology. Any transcriptional errors that result from this process are unintentional.

## 2022-08-20 ENCOUNTER — Encounter: Payer: Self-pay | Admitting: Adult Health

## 2022-08-20 ENCOUNTER — Ambulatory Visit: Payer: Medicare Other | Admitting: Adult Health

## 2022-08-20 VITALS — BP 136/63 | HR 67 | Ht 59.0 in | Wt 131.8 lb

## 2022-08-20 DIAGNOSIS — I639 Cerebral infarction, unspecified: Secondary | ICD-10-CM

## 2022-08-20 NOTE — Patient Instructions (Addendum)
Continue aspirin 81 mg daily  and atorvastatin for secondary stroke prevention  Continue to follow up with PCP regarding blood pressure and cholesterol management  Maintain strict control of hypertension with blood pressure goal below 130/90 and cholesterol with LDL cholesterol (bad cholesterol) goal below 70 mg/dL.   Signs of a Stroke? Follow the BEFAST method:  Balance Watch for a sudden loss of balance, trouble with coordination or vertigo Eyes Is there a sudden loss of vision in one or both eyes? Or double vision?  Face: Ask the person to smile. Does one side of the face droop or is it numb?  Arms: Ask the person to raise both arms. Does one arm drift downward? Is there weakness or numbness of a leg? Speech: Ask the person to repeat a simple phrase. Does the speech sound slurred/strange? Is the person confused ? Time: If you observe any of these signs, call 911.       Thank you for coming to see [place] at [facility]. I hope we have been able to provide you high quality care today.  You may receive a patient satisfaction survey over the next few weeks. We would appreciate your feedback and comments so that we may continue to improve ourselves and the health of our patients.     Stroke Prevention Some medical conditions and lifestyle choices can lead to a higher risk for a stroke. You can help to prevent a stroke by eating healthy foods and exercising. It also helps to not smoke and to manage any health problems you may have. How can this condition affect me? A stroke is an emergency. It should be treated right away. A stroke can lead to brain damage or threaten your life. There is a better chance of surviving and getting better after a stroke if you get medical help right away. What can increase my risk? The following medical conditions may increase your risk of a stroke: Diseases of the heart and blood vessels (cardiovascular disease). High blood pressure  (hypertension). Diabetes. High cholesterol. Sickle cell disease. Problems with blood clotting. Being very overweight. Sleeping problems (obstructivesleep apnea). Other risk factors include: Being older than age 71. A history of blood clots, stroke, or mini-stroke (TIA). Race, ethnic background, or a family history of stroke. Smoking or using tobacco products. Taking birth control pills, especially if you smoke. Heavy alcohol and drug use. Not being active. What actions can I take to prevent this? Manage your health conditions High cholesterol. Eat a healthy diet. If this is not enough to manage your cholesterol, you may need to take medicines. Take medicines as told by your doctor. High blood pressure. Try to keep your blood pressure below 130/80. If your blood pressure cannot be managed through a healthy diet and regular exercise, you may need to take medicines. Take medicines as told by your doctor. Ask your doctor if you should check your blood pressure at home. Have your blood pressure checked every year. Diabetes. Eat a healthy diet and get regular exercise. If your blood sugar (glucose) cannot be managed through diet and exercise, you may need to take medicines. Take medicines as told by your doctor. Talk to your doctor about getting checked for sleeping problems. Signs of a problem can include: Snoring a lot. Feeling very tired. Make sure that you manage any other conditions you have. Nutrition  Follow instructions from your doctor about what to eat or drink. You may be told to: Eat and drink fewer calories each  day. Limit how much salt (sodium) you use to 1,500 milligrams (mg) each day. Use only healthy fats for cooking, such as olive oil, canola oil, and sunflower oil. Eat healthy foods. To do this: Choose foods that are high in fiber. These include whole grains, and fresh fruits and vegetables. Eat at least 5 servings of fruits and vegetables a day. Try to fill  one-half of your plate with fruits and vegetables at each meal. Choose low-fat (lean) proteins. These include low-fat cuts of meat, chicken without skin, fish, tofu, beans, and nuts. Eat low-fat dairy products. Avoid foods that: Are high in salt. Have saturated fat. Have trans fat. Have cholesterol. Are processed or pre-made. Count how many carbohydrates you eat and drink each day. Lifestyle If you drink alcohol: Limit how much you have to: 0-1 drink a day for women who are not pregnant. 0-2 drinks a day for men. Know how much alcohol is in your drink. In the [place] one drink equals one 12 oz bottle of beer (366m), one 5 oz glass of wine (1462m, or one 1 oz glass of hard liquor (4438m Do not smoke or use any products that have nicotine or tobacco. If you need help quitting, ask your doctor. Avoid secondhand smoke. Do not use drugs. Activity  Try to stay at a healthy weight. Get at least 30 minutes of exercise on most days, such as: Fast walking. Biking. Swimming. Medicines Take over-the-counter and prescription medicines only as told by your doctor. Avoid taking birth control pills. Talk to your doctor about the risks of taking birth control pills if: You are over 35 42ars old. You smoke. You get very bad headaches. You have had a blood clot. Where to find more information American Stroke Association: www.strokeassociation.org Get help right away if: You or a loved one has any signs of a stroke. "BE FAST" is an easy way to remember the warning signs: B - Balance. Dizziness, sudden trouble walking, or loss of balance. E - Eyes. Trouble seeing or a change in how you see. F - Face. Sudden weakness or loss of feeling of the face. The face or eyelid may droop on one side. A - Arms. Weakness or loss of feeling in an arm. This happens all of a sudden and most often on one side of the body. S - Speech. Sudden trouble speaking, slurred speech, or trouble understanding what people  say. T - Time. Time to call emergency services. Write down what time symptoms started. You or a loved one has other signs of a stroke, such as: A sudden, very bad headache with no known cause. Feeling like you may vomit (nausea). Vomiting. A seizure. These symptoms may be an emergency. Get help right away. Call your local emergency services (911 in the [place] Do not wait to see if the symptoms will go away. Do not drive yourself to the hospital. Summary You can help to prevent a stroke by eating healthy, exercising, and not smoking. It also helps to manage any health problems you have. Do not smoke or use any products that contain nicotine or tobacco. Get help right away if you or a loved one has any signs of a stroke. This information is not intended to replace advice given to you by your health care provider. Make sure you discuss any questions you have with your health care provider. Document Revised: 02/12/2020 Document Reviewed: 02/12/2020 Elsevier Patient Education  [email or web address]

## 2022-08-24 NOTE — Therapy (Unsigned)
OUTPATIENT PHYSICAL THERAPY TREATMENT NOTE   Patient Name: Laurie [name] MRN: [id number] DOB:July 05, 1932, 89 y.o., female Today's Date: 08/25/2022  END OF SESSION:  PT End of Session - 08/25/22 1458     Visit Number 8    Number of Visits 16    Date for PT Re-Evaluation 09/17/22    Authorization Type [employer] MEDICARE    Progress Note Due on Visit 10    PT Start Time 1352    PT Stop Time 1430    PT Time Calculation (min) 38 min    Activity Tolerance Patient tolerated treatment well    Behavior During Therapy WFL for tasks assessed/performed               Past Medical History:  Diagnosis Date   CKD (chronic kidney disease), stage III ([place])    Family history of breast cancer    mother   Family history of colon cancer    father   Glaucoma    Osteopenia    SBO (small bowel obstruction) ([place])    Past Surgical History:  Procedure Laterality Date   APPENDECTOMY     CESAREAN SECTION     x6   CORONARY STENT INTERVENTION N/A 06/08/2022   Procedure: CORONARY STENT INTERVENTION;  Surgeon: Troy Sine, MD;  Location: [facility];  Service: Cardiovascular;  Laterality: N/A;   ESOPHAGOGASTRODUODENOSCOPY  07/07/2012   Procedure: ESOPHAGOGASTRODUODENOSCOPY (EGD);  Surgeon: Arta Silence, MD;  Location: [facility] ENDOSCOPY;  Service: Endoscopy;  Laterality: N/A;   GLAUCOMA SURGERY Bilateral 02/17/2022   LEFT COLECTOMY  04/30/2011   Sr Streck   LEFT HEART CATH AND CORONARY ANGIOGRAPHY N/A 06/08/2022   Procedure: LEFT HEART CATH AND CORONARY ANGIOGRAPHY;  Surgeon: Troy Sine, MD;  Location: [facility];  Service: Cardiovascular;  Laterality: N/A;   tubal cyst  04/30/2011   L fallopian tube cyst incidentally found   Patient Active Problem List   Diagnosis Date Noted   Non-ST elevation (NSTEMI) myocardial infarction ([place]) 06/06/2022   Elevated troponin 06/05/2022   Hiatal hernia 06/05/2022   Elevated d-dimer 06/05/2022   Abnormal urinalysis 06/05/2022   HLD  (hyperlipidemia) 06/05/2022   History of stroke 06/05/2022   Chronic kidney disease, stage 3b ([place]) 06/05/2022   Uncontrolled hypertension 12/30/2021   Glaucoma 12/29/2021   Acute ischemic stroke ([place]) 12/28/2021   Chronic pain of right knee 01/15/2020   Unilateral primary osteoarthritis, left knee 01/25/2018   Unilateral primary osteoarthritis, right knee 01/25/2018   Chronic pain of both knees 01/25/2018   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[place]) 12/22/2010    PCP: Lawerance Cruel, MD   REFERRING PROVIDER: Lawerance Cruel, MD   REFERRING DIAG: Cervicalgia [M54.2]   THERAPY DIAG:  Cervicalgia  Abnormal posture  Muscle weakness (generalized)  Difficulty in walking, not elsewhere classified  Rationale for Evaluation and Treatment: Rehabilitation  ONSET DATE: 3+ years ago  SUBJECTIVE:  SUBJECTIVE STATEMENT: Pt states that she continues to wear her neck brace about 50% of the time. Not wearing neck brace today.  No assistive device. She denies any recent falls.     PERTINENT HISTORY:  Per eval:   wearing a neck brace on and off for 3 years. Pt presents with significant bruising on her face due to a recent fall in her bathroom. Her son states that she had a stroke on her L side  in June 2023, and a heart attack in November 2023. Pt states that she does not fall frequently but does have some difficulty with transitions. Pt reports having a pinched nerve in her R side that has been causing her pain for 3+ years. She has seen a chiropractor for her neck, with no relief. Pt uses a quad cane, but did not have it present today.     Uncontrolled HTN, history of a mild stroke in June with no residual effects , Non- ST elevation MI in November 2023 glaucoma PAIN:  Are you having pain?   no Cervical pain of 0/10    PRECAUTIONS: Fall  WEIGHT BEARING RESTRICTIONS: No  FALLS:  Has patient fallen in last 6 months? Yes. Number of falls 2  LIVING ENVIRONMENT: Lives with: lives with their son Lives in: House/apartment Stairs: Yes: Internal: 12 steps; on right going up and External: 2 steps; on right going up Has following equipment at home: Quad cane large base, Walker - 4 wheeled, and shower chair  OCCUPATION: Retired   PLOF: Independent  PATIENT GOALS: Pt would like to reduce the pain in her neck.   OBJECTIVE:  Cervical ROM AROM  07/15/2022 1/24  Flexion 15 Sharp pain   Extension 10   Side bending L  10   Side bending R 7   Rotation L  15 20  Rotation R 12 15   Full UE mobility with no pain reported  FUNCTIONAL TESTS:  07/21/2022: 5 times sit to/from stand:  31.7 sec with use of UE and legs against back of chair Timed Up and Go:  25.1 sec with CGA of PT  08/13/2022: 3 minute walk:  195 ft with CGA  TODAY'S TREATMENT:  08/25/2022 Nustep Level 2 x5 min with PT present to discuss status Cervical rotation and extension x10 each Cerivcal isometrics with PT assist x10 each way Seated scapular retraction with verbal, visual and tactile cues. Pt is unable to perform proper movement. Shoulder shrugs instead. X5 Postural control with seated cat/ cow x15  Sit to/from stand from chair 2x5 (requires UE use) Standing shoulder flexion and abduction with 1# dumbbells 2x10 bilat each  Seated adductor squeezes with ball 2x10, 5 sec hold.  Seated LAQ 1x10  Seated marches x 20  OH reaches with 2lb weighted ball x15  Shoulder Abd with green loop x15   08/19/2022: Nustep Level 1 x5 min with PT present to discuss status Reaching UE overhead and to the side for numbers on the wall Railing push ups 10x  Heel and toe raises 10x Side stepping at the railing 2 hand support 4 laps Low cone toe taps alternating 10x Seated 1# dumbbell press overhead 5x2 Standing 1#  dumbbell sliding down thighs to knee level (Like taking pants down and pulling them back up) 10x Sit to stand from chair + cushion 10x Sit to stand from standard chair no cushion no hands 3x (unable to get center of gravity forward and pushes chair with backs of knees) Standing red band  bil shoulder row/extensions 2x10 Seated red band bil row 10x Manual therapy: seated in chair gentle cervical distraction, soft tissue mobilization to cervical paraspinals and suboccipitals; grade 1 C2/3 cervical rotation mob with movement 10x right/left   08/13/2022: Cervical rotation and extension x10 each Cerivcal isometrics with PT assist x10 each way Seated scapular retraction x10 Sit to/from stand from chair 2x5 (requires UE use) Standing shoulder flexion and abduction with 1# dumbbells 2x10 bilat each (with seated recovery period in between sets) 3 minute ambulation:  195 ft with CGA Nustep Level 3 x6 min with PT present to discuss status Seated active assistive cervical rotation to assist with increased ROM x10 bilat Seated active assistive posterior shoulder rolls x10 bilat  Patient Education: Patient issued HEP and educated on daily performance.  Pt provided with printed HEP.  Patient returned demonstration and verbalizes understanding.   HOME EXERCISE PLAN:  Access Code: [email or web address] URL: [email or web address] Date: 08/13/2022 Prepared by: Shelby Dubin Menke  Exercises - Supine Chin Tuck  - 2 x daily - 7 x weekly - 2 sets - 10 reps - 2 hold - Supine Cervical Rotation AROM on Pillow  - 2 x daily - 7 x weekly - 2 sets - 10 reps - 2 hold - Supine Cervical Sidebending Stretch  - 1 x daily - 7 x weekly - 1 sets - 3 reps - 10 hold - Seated Cervical Sidebending AROM  - 1 x daily - 7 x weekly - 1 sets - 3 reps - 10 hold - Seated Scapular Retraction  - 1 x daily - 7 x weekly - 1 sets - 10 reps - Seated March  - 1 x daily - 7 x weekly - 1 sets - 10 reps - Seated Shoulder Flexion  - 1 x daily - 7  x weekly - 1 sets - 10 reps - Seated Hip Adduction Isometrics with Ball  - 1 x daily - 7 x weekly - 1 sets - 10 reps - Sit to Stand with Armchair  - 1 x daily - 7 x weekly - 2 sets - 5 reps  ASSESSMENT:  CLINICAL IMPRESSION:  Neck is very painful with cervical rotation to the right. Pt has a hard time with directions and has a tendency to overcompensate with shoulder shrugs, or whole upper body extension with upper body movements. She fatigues very quickly with exercises, but tolerates them fairly. Pt demonstrates improvements in her overall strength with functional movements. The patient would benefit from a continuation of skilled PT for a further progression of strengthening and functional mobility.  Will continue to update and promote independence in a HEP needed for a return to the highest functional level possible with ADLs.       OBJECTIVE IMPAIRMENTS: decreased activity tolerance, decreased shoulder mobility, decreased ROM, decreased strength, impaired flexibility, impaired UE use, postural dysfunction, and pain.  ACTIVITY LIMITATIONS: reaching, lifting, carry,  cleaning, driving, and or occupation  PERSONAL FACTORS:  also affecting patient's functional outcome.  REHAB POTENTIAL: Good  CLINICAL DECISION MAKING: Stable/uncomplicated  EVALUATION COMPLEXITY: Low    GOALS: Short term PT Goals Target date:  08/05/2022 Pt will be I and compliant with HEP. Baseline:  Goal status: Met  Pt will decrease pain by 25% overall Baseline: Goal status: MET on 08/13/2022  Long term PT goals Target date: 09/17/2022  Pt will improve cervical AROM by 5 degrees in all directions to improve cervical movements with walking.  Baseline: Goal status: New  Pt will be able to ambulate  1015f with an appropriate AD in order to run errands.  Baseline: Goal status: In progress  Pt will report <2 falls throughout her duration of PT.  Baseline: Goal status: New  Pt will reduce pain to overall  less than 3/10 with usual activity and work activity. Baseline: Goal status: New       5. Pt will be independent with a comprehensive HEP prior to discharge.  Baseline: Goal Status:  NEW  PLAN: PT FREQUENCY: 2x  PT DURATION: 8 weeks  PLANNED INTERVENTIONS (unless contraindicated): aquatic PT, Canalith repositioning, cryotherapy, Electrical stimulation, Iontophoresis with 4 mg/ml dexamethasome, Moist heat, traction, Ultrasound, gait training, Therapeutic exercise, balance training, neuromuscular re-education, patient/family education, prosthetic training, manual techniques, passive ROM, dry needling, taping, vasopnuematic device, vestibular, spinal manipulations, joint manipulations  PLAN FOR NEXT SESSION: recheck cervical ROM next visit, manual therapy; progress strengthening and range of motion, encourage to use QC    SRudi HeapPT, DPT 08/25/22  3:33 PM

## 2022-08-25 ENCOUNTER — Ambulatory Visit: Payer: Medicare Other | Admitting: Physical Therapy

## 2022-08-25 DIAGNOSIS — R293 Abnormal posture: Secondary | ICD-10-CM

## 2022-08-25 DIAGNOSIS — R262 Difficulty in walking, not elsewhere classified: Secondary | ICD-10-CM | POA: Diagnosis not present

## 2022-08-25 DIAGNOSIS — M542 Cervicalgia: Secondary | ICD-10-CM | POA: Diagnosis not present

## 2022-08-25 DIAGNOSIS — M6281 Muscle weakness (generalized): Secondary | ICD-10-CM

## 2022-08-27 NOTE — Therapy (Signed)
OUTPATIENT PHYSICAL THERAPY TREATMENT NOTE   Patient Name: Laurie [name] MRN: [id number] DOB:1931-12-08, 89 y.o., female Today's Date: 08/28/2022  END OF SESSION:  PT End of Session - 08/28/22 1131     Visit Number 9    Number of Visits 16    Date for PT Re-Evaluation 09/17/22    Authorization Type [employer] MEDICARE    Progress Note Due on Visit 10    PT Start Time 1100    PT Stop Time 1140    PT Time Calculation (min) 40 min    Activity Tolerance Patient tolerated treatment well    Behavior During Therapy WFL for tasks assessed/performed                Past Medical History:  Diagnosis Date   CKD (chronic kidney disease), stage III ([place])    Family history of breast cancer    mother   Family history of colon cancer    father   Glaucoma    Osteopenia    SBO (small bowel obstruction) ([place])    Past Surgical History:  Procedure Laterality Date   APPENDECTOMY     CESAREAN SECTION     x6   CORONARY STENT INTERVENTION N/A 06/08/2022   Procedure: CORONARY STENT INTERVENTION;  Surgeon: Troy Sine, MD;  Location: [facility];  Service: Cardiovascular;  Laterality: N/A;   ESOPHAGOGASTRODUODENOSCOPY  07/07/2012   Procedure: ESOPHAGOGASTRODUODENOSCOPY (EGD);  Surgeon: Arta Silence, MD;  Location: [facility] ENDOSCOPY;  Service: Endoscopy;  Laterality: N/A;   GLAUCOMA SURGERY Bilateral 02/17/2022   LEFT COLECTOMY  04/30/2011   Sr Streck   LEFT HEART CATH AND CORONARY ANGIOGRAPHY N/A 06/08/2022   Procedure: LEFT HEART CATH AND CORONARY ANGIOGRAPHY;  Surgeon: Troy Sine, MD;  Location: [facility];  Service: Cardiovascular;  Laterality: N/A;   tubal cyst  04/30/2011   L fallopian tube cyst incidentally found   Patient Active Problem List   Diagnosis Date Noted   Non-ST elevation (NSTEMI) myocardial infarction ([place]) 06/06/2022   Elevated troponin 06/05/2022   Hiatal hernia 06/05/2022   Elevated d-dimer 06/05/2022   Abnormal urinalysis 06/05/2022   HLD  (hyperlipidemia) 06/05/2022   History of stroke 06/05/2022   Chronic kidney disease, stage 3b ([place]) 06/05/2022   Uncontrolled hypertension 12/30/2021   Glaucoma 12/29/2021   Acute ischemic stroke ([place]) 12/28/2021   Chronic pain of right knee 01/15/2020   Unilateral primary osteoarthritis, left knee 01/25/2018   Unilateral primary osteoarthritis, right knee 01/25/2018   Chronic pain of both knees 01/25/2018   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 She has not worn her neck brace at all today.     PERTINENT HISTORY:  Per eval:   wearing a neck brace on and off for 3 years. Pt presents with significant bruising on her face due to a recent fall in her bathroom. Her son states that she had a stroke on her L side  in June 2023, and a heart attack in November 2023. Pt states that she does not fall frequently but does have some difficulty with transitions. Pt reports having a pinched nerve in her R side that has been causing her pain for 3+ years. She has seen a chiropractor for her neck, with no relief. Pt uses a quad cane, but did not have it present today.     Uncontrolled HTN, history of a mild stroke in June with no residual effects , Non- ST elevation MI in November 2023 glaucoma PAIN:  Are you having pain?  no Cervical pain of 0/10    PRECAUTIONS: Fall  WEIGHT  BEARING RESTRICTIONS: No  FALLS:  Has patient fallen in last 6 months? Yes. Number of falls 2  LIVING ENVIRONMENT: Lives with: lives with their son Lives in: House/apartment Stairs: Yes: Internal: 12 steps; on right going up and External: 2 steps; on right going up Has following equipment at home: Quad cane large base, Walker - 4 wheeled, and shower chair  OCCUPATION: Retired   PLOF: Independent  PATIENT GOALS: Pt would like to reduce the pain in her neck.   OBJECTIVE:  Cervical ROM AROM  07/15/2022 1/24  Flexion 15 Sharp pain   Extension 10   Side bending L  10   Side bending R 7   Rotation L  15 20  Rotation R 12 15   Full UE mobility with no pain reported  FUNCTIONAL TESTS:  07/21/2022: 5 times sit to/from stand:  31.7 sec with use of UE and legs against back of chair Timed Up and Go:  25.1 sec with CGA of PT  08/13/2022: 3 minute walk:  195 ft with CGA  TODAY'S TREATMENT:  08/28/2022 Nustep Level 2 x10 min with PT present to discuss status Cervical rotation and extension x10 each Postural control with seated cat/ cow x15  Sit to/from stand from chair 2x5 (Pt pushes into back of mat for stability)  Seated adductor squeezes with ball 2x10, 5 sec hold.  Seated LAQ 1x10  Seated marches with green loop x 20  OH reaches with 2lb x15  Shoulder flexion with 2lb weight x15  Seated clams with green loop x20 Walking with big steps 1 lap around clinic  08/28/2022 Nustep Level 2 x5 min with PT present to discuss status Cervical rotation and extension x10 each Cerivcal isometrics with PT assist x10 each way Seated scapular retraction with verbal, visual and tactile cues. Pt is unable to perform proper movement. Shoulder shrugs instead. X5 Postural control with seated cat/ cow x15  Sit to/from stand from chair 2x5 (requires UE use) Standing shoulder flexion and abduction with 1# dumbbells 2x10 bilat each  Seated adductor squeezes with ball 2x10, 5 sec hold.  Seated LAQ  1x10  Seated marches x 20  OH reaches with 2lb weighted ball x15  Shoulder Abd with green loop x15   08/19/2022: Nustep Level 1 x5 min with PT present to discuss status Reaching UE overhead and to the side for numbers on the wall Railing push ups 10x  Heel  and toe raises 10x Side stepping at the railing 2 hand support 4 laps Low cone toe taps alternating 10x Seated 1# dumbbell press overhead 5x2 Standing 1# dumbbell sliding down thighs to knee level (Like taking pants down and pulling them back up) 10x Sit to stand from chair + cushion 10x Sit to stand from standard chair no cushion no hands 3x (unable to get center of gravity forward and pushes chair with backs of knees) Standing red band bil shoulder row/extensions 2x10 Seated red band bil row 10x Manual therapy: seated in chair gentle cervical distraction, soft tissue mobilization to cervical paraspinals and suboccipitals; grade 1 C2/3 cervical rotation mob with movement 10x right/left   Patient Education: Patient issued HEP and educated on daily performance.  Pt provided with printed HEP.  Patient returned demonstration and verbalizes understanding.   HOME EXERCISE PLAN:  Access Code: [email or web address] URL: [email or web address] Date: 08/13/2022 Prepared by: Shelby Dubin Menke  Exercises - Supine Chin Tuck  - 2 x daily - 7 x weekly - 2 sets - 10 reps - 2 hold - Supine Cervical Rotation AROM on Pillow  - 2 x daily - 7 x weekly - 2 sets - 10 reps - 2 hold - Supine Cervical Sidebending Stretch  - 1 x daily - 7 x weekly - 1 sets - 3 reps - 10 hold - Seated Cervical Sidebending AROM  - 1 x daily - 7 x weekly - 1 sets - 3 reps - 10 hold - Seated Scapular Retraction  - 1 x daily - 7 x weekly - 1 sets - 10 reps - Seated March  - 1 x daily - 7 x weekly - 1 sets - 10 reps - Seated Shoulder Flexion  - 1 x daily - 7 x weekly - 1 sets - 10 reps - Seated Hip Adduction Isometrics with Ball  - 1 x daily - 7 x weekly - 1 sets - 10 reps - Sit  to Stand with Armchair  - 1 x daily - 7 x weekly - 2 sets - 5 reps  ASSESSMENT:  CLINICAL IMPRESSION:  Pt tolerates all exercises fairly. She continues to demonstrate compensations with postural and strengthening exercises despite cueing. Pt plans to be seen for 2 more sessions prior to progress note and possible discharge to comprehensive HEP. Pt will continue to benefit from skilled PT to address continued deficits.    OBJECTIVE IMPAIRMENTS: decreased activity tolerance, decreased shoulder mobility, decreased ROM, decreased strength, impaired flexibility, impaired UE use, postural dysfunction, and pain.  ACTIVITY LIMITATIONS: reaching, lifting, carry,  cleaning, driving, and or occupation  PERSONAL FACTORS:  also affecting patient's functional outcome.  REHAB POTENTIAL: Good  CLINICAL DECISION MAKING: Stable/uncomplicated  EVALUATION COMPLEXITY: Low    GOALS: Short term PT Goals Target date:  08/05/2022 Pt will be I and compliant with HEP. Baseline:  Goal status: Met  Pt will decrease pain by 25% overall Baseline: Goal status: MET on 08/13/2022  Long term PT goals Target date: 09/17/2022  Pt will improve cervical AROM by 5 degrees in all directions to improve cervical movements with walking.  Baseline: Goal status: New  Pt will be able to ambulate 1054f with an appropriate AD in order to run errands.  Baseline: Goal status: In progress  Pt will report <2 falls throughout her duration of PT.  Baseline: Goal status: New  Pt will reduce pain to overall less than 3/10 with usual activity and work activity. Baseline: Goal status: New  5. Pt will be independent with a comprehensive HEP prior to discharge.  Baseline: Goal Status:  NEW  PLAN: PT FREQUENCY: 2x  PT DURATION: 8 weeks  PLANNED INTERVENTIONS (unless contraindicated): aquatic PT, Canalith repositioning, cryotherapy, Electrical stimulation, Iontophoresis with 4 mg/ml dexamethasome, Moist heat,  traction, Ultrasound, gait training, Therapeutic exercise, balance training, neuromuscular re-education, patient/family education, prosthetic training, manual techniques, passive ROM, dry needling, taping, vasopnuematic device, vestibular, spinal manipulations, joint manipulations  PLAN FOR NEXT SESSION: 2 sessions then d/c?    Rudi Heap PT, DPT 08/28/22  11:46 AM

## 2022-08-28 ENCOUNTER — Ambulatory Visit: Payer: Medicare Other | Attending: Family Medicine | Admitting: Physical Therapy

## 2022-08-28 ENCOUNTER — Encounter: Payer: Self-pay | Admitting: Physical Therapy

## 2022-08-28 DIAGNOSIS — M6281 Muscle weakness (generalized): Secondary | ICD-10-CM | POA: Insufficient documentation

## 2022-08-28 DIAGNOSIS — R262 Difficulty in walking, not elsewhere classified: Secondary | ICD-10-CM | POA: Diagnosis not present

## 2022-08-28 DIAGNOSIS — M542 Cervicalgia: Secondary | ICD-10-CM | POA: Insufficient documentation

## 2022-08-28 DIAGNOSIS — R293 Abnormal posture: Secondary | ICD-10-CM

## 2022-09-04 NOTE — Therapy (Addendum)
OUTPATIENT PHYSICAL THERAPY PROGRESS/TREATMENT NOTE   Progress Note Reporting Period 07/15/2022 to 07/08/2023  See note below for Objective Data and Assessment of Progress/Goals.      Patient Name: Laurie [name] MRN: [id number] DOB:[birth year], 89 y.o., female Today's Date: 09/07/2022  END OF SESSION:  PT End of Session - 09/07/22 1239     Visit Number 10    Number of Visits 24    Date for PT Re-Evaluation 11/02/22    Authorization Type [employer] MEDICARE    Progress Note Due on Visit 10    PT Start Time 1230    PT Stop Time 1310    PT Time Calculation (min) 40 min    Activity Tolerance Patient tolerated treatment well    Behavior During Therapy WFL for tasks assessed/performed                 Past Medical History:  Diagnosis Date   CKD (chronic kidney disease), stage III (HCC)    Family history of breast cancer    mother   Family history of colon cancer    father   Glaucoma    Osteopenia    SBO (small bowel obstruction) (HCC)    Past Surgical History:  Procedure Laterality Date   APPENDECTOMY     CESAREAN SECTION     x6   CORONARY STENT INTERVENTION N/A 06/08/2022   Procedure: CORONARY STENT INTERVENTION;  Surgeon: Lennette Bihari, MD;  Location: [facility];  Service: Cardiovascular;  Laterality: N/A;   ESOPHAGOGASTRODUODENOSCOPY  07/07/2012   Procedure: ESOPHAGOGASTRODUODENOSCOPY (EGD);  Surgeon: Willis Modena, MD;  Location: [facility] ENDOSCOPY;  Service: Endoscopy;  Laterality: N/A;   GLAUCOMA SURGERY Bilateral 02/17/2022   LEFT COLECTOMY  04/30/2011   Sr Streck   LEFT HEART CATH AND CORONARY ANGIOGRAPHY N/A 06/08/2022   Procedure: LEFT HEART CATH AND CORONARY ANGIOGRAPHY;  Surgeon: Lennette Bihari, MD;  Location: [facility];  Service: Cardiovascular;  Laterality: N/A;   tubal cyst  04/30/2011   L fallopian tube cyst incidentally found   Patient Active Problem List   Diagnosis Date Noted   Non-ST elevation (NSTEMI) myocardial infarction  (HCC) 06/06/2022   Elevated troponin 06/05/2022   Hiatal hernia 06/05/2022   Elevated d-dimer 06/05/2022   Abnormal urinalysis 06/05/2022   HLD (hyperlipidemia) 06/05/2022   History of stroke 06/05/2022   Chronic kidney disease, stage 3b (HCC) 06/05/2022   Uncontrolled hypertension 12/30/2021   Glaucoma 12/29/2021   Acute ischemic stroke (HCC) 12/28/2021   Chronic pain of right knee 01/15/2020   Unilateral primary osteoarthritis, left knee 01/25/2018   Unilateral primary osteoarthritis, right knee 01/25/2018   Chronic pain of both knees 01/25/2018   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HCC) 12/22/2010    PCP: Daisy Floro, MD   REFERRING PROVIDER: Daisy Floro, MD   REFERRING DIAG: Cervicalgia [M54.2]   THERAPY DIAG:  Difficulty in walking, not elsewhere classified - Plan: PT plan of care cert/re-cert  Abnormal posture - Plan: PT plan of care cert/re-cert  Cervicalgia - Plan: PT plan of care cert/re-cert  Muscle weakness (generalized) - Plan: PT plan of care cert/re-cert  Rationale for Evaluation and Treatment: Rehabilitation  ONSET DATE: 3+ years ago  SUBJECTIVE:  SUBJECTIVE STATEMENT: Pt states that she is wearing her neck brace less.     PERTINENT HISTORY:  Per eval:   wearing a neck brace on and off for 3 years. Pt presents with significant bruising on her face due to a recent fall in her bathroom. Her son states that she had a stroke on her L side  in June 2023, and a heart attack in November 2023. Pt states that she does not fall frequently but does have some difficulty with transitions. Pt reports having a pinched nerve in her R side that has been causing her pain for 3+ years. She has seen a chiropractor for her neck, with no relief. Pt uses a quad cane, but did not  have it present today.     Uncontrolled HTN, history of a mild stroke in June with no residual effects , Non- ST elevation MI in November 2023 glaucoma PAIN:  Are you having pain?  no Cervical pain of 0/10    PRECAUTIONS: Fall  WEIGHT BEARING RESTRICTIONS: No  FALLS:  Has patient fallen in last 6 months? Yes. Number of falls 2  LIVING ENVIRONMENT: Lives with: lives with their son Lives in: House/apartment Stairs: Yes: Internal: 12 steps; on right going up and External: 2 steps; on right going up Has following equipment at home: Quad cane large base, Walker - 4 wheeled, and shower chair  OCCUPATION: Retired   PLOF: Independent  PATIENT GOALS: Pt would like to reduce the pain in her neck.   OBJECTIVE:  Cervical ROM AROM  07/15/2022 1/24 09/07/2022  Flexion 15 Sharp pain  25  Extension 10  15  Side bending L  10  18  Side bending R 7  12  Rotation L  15 20 20   Rotation R 12 15 16    Full UE mobility with no pain reported  FUNCTIONAL TESTS:  07/21/2022: 5 times sit to/from stand:  31.7 sec with use of UE and legs against back of chair Timed Up and Go:  25.1 sec with CGA of PT  08/13/2022: 3 minute walk:  195 ft with CGA  09/07/2022: 5 times sit to/from stand:  31.5 sec without use of UE, but pushing legs against back of chair Timed Up and Go:  33.81 sec with CGA of PT  TODAY'S TREATMENT:  09/07/2022 Nustep Level 2 x10 min with PT present to discuss status Cervical rotation and extension x10 each Re-assessment of TUG, 5x STS Reviewed goals  Postural control with seated cat/ cow x15  Sit to/from stand from chair 2x5 (Pt pushes into back of mat for stability)  Standing marches with CGA to fatigue x2 Seated adductor squeezes with ball 2x10, 5 sec hold. Seated LAQ 1x10  Seated marches with green loop x 20  OH reaches with 2lb x15    08/28/2022 Nustep Level 2 x5 min with PT present to discuss status Cervical rotation and extension x10 each Cerivcal  isometrics with PT assist x10 each way Seated scapular retraction with verbal, visual and tactile cues. Pt is unable to perform proper movement. Shoulder shrugs instead. X5 Postural control with seated cat/ cow x15  Sit to/from stand from chair 2x5 (requires UE use) Standing shoulder flexion and abduction with 1# dumbbells 2x10 bilat each  Seated adductor squeezes with ball 2x10, 5 sec hold.  Seated LAQ 1x10  Seated marches x 20  OH reaches with 2lb weighted ball x15  Shoulder Abd with green loop x15   08/19/2022: Nustep Level 1 x5 min  with PT present to discuss status Reaching UE overhead and to the side for numbers on the wall Railing push ups 10x  Heel and toe raises 10x Side stepping at the railing 2 hand support 4 laps Low cone toe taps alternating 10x Seated 1# dumbbell press overhead 5x2 Standing 1# dumbbell sliding down thighs to knee level (Like taking pants down and pulling them back up) 10x Sit to stand from chair + cushion 10x Sit to stand from standard chair no cushion no hands 3x (unable to get center of gravity forward and pushes chair with backs of knees) Standing red band bil shoulder row/extensions 2x10 Seated red band bil row 10x Manual therapy: seated in chair gentle cervical distraction, soft tissue mobilization to cervical paraspinals and suboccipitals; grade 1 C2/3 cervical rotation mob with movement 10x right/left   Patient Education: Patient issued HEP and educated on daily performance.  Pt provided with printed HEP.  Patient returned demonstration and verbalizes understanding.   HOME EXERCISE PLAN:  Access Code: [email or web address] URL: [email or web address] Date: 08/13/2022 Prepared by: Clydie Braun Menke  Exercises - Supine Chin Tuck  - 2 x daily - 7 x weekly - 2 sets - 10 reps - 2 hold - Supine Cervical Rotation AROM on Pillow  - 2 x daily - 7 x weekly - 2 sets - 10 reps - 2 hold - Supine Cervical Sidebending Stretch  - 1 x daily - 7 x weekly - 1 sets  - 3 reps - 10 hold - Seated Cervical Sidebending AROM  - 1 x daily - 7 x weekly - 1 sets - 3 reps - 10 hold - Seated Scapular Retraction  - 1 x daily - 7 x weekly - 1 sets - 10 reps - Seated March  - 1 x daily - 7 x weekly - 1 sets - 10 reps - Seated Shoulder Flexion  - 1 x daily - 7 x weekly - 1 sets - 10 reps - Seated Hip Adduction Isometrics with Ball  - 1 x daily - 7 x weekly - 1 sets - 10 reps - Sit to Stand with Armchair  - 1 x daily - 7 x weekly - 2 sets - 5 reps  ASSESSMENT:  CLINICAL IMPRESSION:  Laurie [name] has progressed fair with therapy.  Improved impairments include: Cervical ROM and reduced pain per pt. She states that she is now wearing her neck brace about 35% of the day. Pt continues to demonstrate significant deficits with her balance, but has improved bilat LE strength. She has difficulty with her vision which directly impacts her ability to see where she is going without CGA and verbal cues. She requires VC for finding a chair prior to sitting. Pt denies any recent falls. She will continue to benefit from skilled PT to continue working on gait and balance deficits to minimize fall risk. Please see GOALS section for progress on short term and long term goals established at evaluation.     OBJECTIVE IMPAIRMENTS: decreased activity tolerance, decreased shoulder mobility, decreased ROM, decreased strength, impaired flexibility, impaired UE use, postural dysfunction, and pain.  ACTIVITY LIMITATIONS: reaching, lifting, carry,  cleaning, driving, and or occupation  PERSONAL FACTORS:  also affecting patient's functional outcome.  REHAB POTENTIAL: Good  CLINICAL DECISION MAKING: Stable/uncomplicated  EVALUATION COMPLEXITY: Low    GOALS: Short term PT Goals Target date:  08/05/2022 Pt will be I and compliant with HEP. Baseline:  Goal status: Met  Pt will decrease pain by 25% overall Baseline: Goal  status: MET on 08/13/2022  Long term PT goals Target date:  11/02/2022  Pt will improve cervical AROM by 5 degrees in all directions to improve cervical movements with walking.  Baseline: Goal status: Ongoing 09/07/2022  Pt will be able to ambulate 1056ft with an appropriate AD in order to run errands.  Baseline: Goal status: In progress  Pt will report <2 falls throughout her duration of PT.  Baseline: Goal status: MET and ongoing 09/07/2022  Pt will reduce pain to overall less than 3/10 with usual activity and work activity. Baseline: Goal status: ONGOING 5-7 when cervical pain is present 09/07/2022       5. Pt will be independent with a comprehensive HEP prior to discharge.  Baseline: Goal Status:  ONGOING 09/07/2022     6. .Pt will improve her STS by 2.3 seconds for Minimal clinically important difference.      Baseline:      Goal status: NEW 7. Pt will improve TUG by 3.4 seconds or Minimal clinically important difference.      Baseline:      Goal status: NEW  PLAN: PT FREQUENCY: 1-2x  PT DURATION: 6-8 weeks  PLANNED INTERVENTIONS (unless contraindicated): aquatic PT, Canalith repositioning, cryotherapy, Electrical stimulation, Iontophoresis with 4 mg/ml dexamethasome, Moist heat, traction, Ultrasound, gait training, Therapeutic exercise, balance training, neuromuscular re-education, patient/family education, prosthetic training, manual techniques, passive ROM, dry needling, taping, vasopnuematic device, vestibular, spinal manipulations, joint manipulations  PLAN FOR NEXT SESSION: Continue to work on LE strengthening, balance and gait.     Royal Hawthorn PT, DPT 09/07/22  2:04 PM  PHYSICAL THERAPY DISCHARGE SUMMARY  Visits from Start of Care: 10  Current functional level related to goals / functional outcomes: Pt demonstrates improved cervical ROM, and reduced pain. LE strength    Remaining deficits: Continued pain requiring intermittent use of neck brace, balance. Gait disturbances.   Education / Equipment: Theraband.     Patient agrees to discharge. Patient goals were partially met. Patient is being discharged due to not returning since the last visit. Pt also just received an ORIF of hip/ femur per recent notes.

## 2022-09-07 ENCOUNTER — Ambulatory Visit: Payer: Medicare Other | Admitting: Physical Therapy

## 2022-09-07 ENCOUNTER — Ambulatory Visit: Payer: Medicare Other | Admitting: Podiatry

## 2022-09-07 ENCOUNTER — Encounter: Payer: Self-pay | Admitting: Physical Therapy

## 2022-09-07 DIAGNOSIS — R262 Difficulty in walking, not elsewhere classified: Secondary | ICD-10-CM | POA: Diagnosis not present

## 2022-09-07 DIAGNOSIS — M6281 Muscle weakness (generalized): Secondary | ICD-10-CM

## 2022-09-07 DIAGNOSIS — M542 Cervicalgia: Secondary | ICD-10-CM

## 2022-09-07 DIAGNOSIS — R293 Abnormal posture: Secondary | ICD-10-CM | POA: Diagnosis not present

## 2022-09-10 ENCOUNTER — Encounter: Payer: Medicare Other | Admitting: Physical Therapy

## 2022-09-21 ENCOUNTER — Telehealth: Payer: Self-pay | Admitting: Physical Therapy

## 2022-09-21 NOTE — Telephone Encounter (Signed)
LVM on patient's home phone to call and schedule more appts. She requested to continue to be seen for her balance during last progress note, but has yet to schedule more appts. She requires assistance from her son and daughter for a ride.

## 2022-09-24 ENCOUNTER — Ambulatory Visit: Payer: Medicare Other | Attending: Nurse Practitioner | Admitting: Nurse Practitioner

## 2022-09-24 ENCOUNTER — Encounter: Payer: Self-pay | Admitting: Nurse Practitioner

## 2022-09-24 VITALS — BP 145/78 | HR 53 | Ht 59.0 in | Wt 129.4 lb

## 2022-09-24 DIAGNOSIS — N1832 Chronic kidney disease, stage 3b: Secondary | ICD-10-CM

## 2022-09-24 DIAGNOSIS — I10 Essential (primary) hypertension: Secondary | ICD-10-CM | POA: Diagnosis not present

## 2022-09-24 DIAGNOSIS — I2510 Atherosclerotic heart disease of native coronary artery without angina pectoris: Secondary | ICD-10-CM | POA: Diagnosis not present

## 2022-09-24 DIAGNOSIS — Z8673 Personal history of transient ischemic attack (TIA), and cerebral infarction without residual deficits: Secondary | ICD-10-CM

## 2022-09-24 DIAGNOSIS — E785 Hyperlipidemia, unspecified: Secondary | ICD-10-CM

## 2022-09-24 NOTE — Progress Notes (Signed)
Office Visit    Patient Name: Laurie [name] Date of Encounter: 09/24/2022  Primary Care Provider:  Lawerance Cruel, MD Primary Cardiologist:  Kirk Ruths, MD  Chief Complaint    89 year old female with a history of CAD s/p NSTEMI, DES-RCA in 05/2022, hypertension, hyperlipidemia, CVA, CKD stage IIIb, hiatal hernia and former tobacco use who presents for follow-up related to CAD.   Past Medical History    Past Medical History:  Diagnosis Date   CKD (chronic kidney disease), stage III (HCC)    Family history of breast cancer    mother   Family history of colon cancer    father   Glaucoma    Osteopenia    SBO (small bowel obstruction) ([place])    Past Surgical History:  Procedure Laterality Date   APPENDECTOMY     CESAREAN SECTION     x6   CORONARY STENT INTERVENTION N/A 06/08/2022   Procedure: CORONARY STENT INTERVENTION;  Surgeon: Troy Sine, MD;  Location: [place] [facility];  Service: Cardiovascular;  Laterality: N/A;   ESOPHAGOGASTRODUODENOSCOPY  07/07/2012   Procedure: ESOPHAGOGASTRODUODENOSCOPY (EGD);  Surgeon: Arta Silence, MD;  Location: [facility] ENDOSCOPY;  Service: Endoscopy;  Laterality: N/A;   GLAUCOMA SURGERY Bilateral 02/17/2022   LEFT COLECTOMY  04/30/2011   Sr Streck   LEFT HEART CATH AND CORONARY ANGIOGRAPHY N/A 06/08/2022   Procedure: LEFT HEART CATH AND CORONARY ANGIOGRAPHY;  Surgeon: Troy Sine, MD;  Location: [place] [facility];  Service: Cardiovascular;  Laterality: N/A;   tubal cyst  04/30/2011   L fallopian tube cyst incidentally found    Allergies  Allergies  Allergen Reactions   Actonel [Risedronate] Nausea And Vomiting   Fosamax [Alendronate] Nausea And Vomiting   Miacalcin Other (See Comments)    Unknown reaction   Neomycin Other (See Comments)    Unknown reaction   Neosporin [Neomycin-Polymyxin-Gramicidin] Swelling   Neurontin [Gabapentin] Other (See Comments)    Confusion   Codeine Palpitations     Labs/Other  Studies Reviewed    The following studies were reviewed today: Echo 05/2022: IMPRESSIONS    1. Left ventricular ejection fraction, by estimation, is 65 to 70%. The  left ventricle has normal function. The left ventricle has no regional  wall motion abnormalities. There is mild left ventricular hypertrophy.  Left ventricular diastolic parameters  are consistent with Grade I diastolic dysfunction (impaired relaxation).   2. Right ventricular systolic function is normal. The right ventricular  size is normal.   3. The mitral valve is abnormal. No evidence of mitral valve  regurgitation. No evidence of mitral stenosis.   4. The aortic valve is abnormal. Aortic valve regurgitation is not  visualized. No aortic stenosis is present.   5. The inferior vena cava is normal in size with greater than 50%  respiratory variability, suggesting right atrial pressure of 3 mmHg.   Comparison(s): No significant change from prior study.   LHC 05/2022:    Mid RCA to Dist RCA lesion is 40% stenosed.   Dist RCA lesion is 99% stenosed.   Prox RCA-1 lesion is 50% stenosed.   Prox RCA-2 lesion is 50% stenosed.   Prox LAD lesion is 20% stenosed.   A drug-eluting stent was successfully placed.   Post intervention, there is a 0% residual stenosis.   Post intervention, there is a 0% residual stenosis.   NSTEMI secondary to subtotal 99% stenosis of the RCA after the acute margin proximal to the PDA takeoff in a  dominant RCA vessel.  There are segmental 50% proximal stenosis on proximal bend of the RCA vessel.   Mildly calcified proximal LAD with 20% narrowing.   Normal but tortuous left circumflex coronary artery   LVEDP 7 mmHg   Difficult but successful percutaneous coronary convention utilizing a telescope guide extension catheter with ultimate PCI and DES stenting insertion of a 3.5 x 22 mm Onyx frontier stent postdilated to 3.75 mm with the distal stenoses being reduced to 0% and brisk TIMI-3 flow.    RECOMMENDATION: DAPT for minimum of 12 months.  Initial medical therapy for concomitant CAD.  Aggressive lipid-lowering therapy with target LDL less than 70. Recent Labs: 12/29/2021: TSH 2.321 06/05/2022: ALT 11; B Natriuretic Peptide 95.7; Magnesium 2.0 06/09/2022: BUN 20; Creatinine, Ser 1.60; Hemoglobin 12.2; Platelets 223; Potassium 4.0; Sodium 140  Recent Lipid Panel    Component Value Date/Time   CHOL 225 (H) 12/29/2021 1455   TRIG 114 12/29/2021 1455   HDL 72 12/29/2021 1455   CHOLHDL 3.1 12/29/2021 1455   VLDL 23 12/29/2021 1455   LDLCALC 130 (H) 12/29/2021 1455    History of Present Illness    89 year old female with the above past medical history including CAD s/p NSTEMI, DES-RCA in 05/2022, hypertension, hyperlipidemia, CVA, CKD stage IIIb, hiatal hernia, and former tobacco use.    She presented to the ED on 06/05/2022 in the setting of NSTEMI.  She had sudden onset nausea and generalized weakness followed by bilateral upper arm pain.  CTA was negative for PE or dissection. Troponin peaked at 877. Cardiology was consulted.  Echocardiogram showed EF 65 to 70%, normal LV function, no RWMA, mild LVH, G1 DD, normal RV systolic function, mild to moderate MAC.  She underwent cardiac catheterization on 06/08/2022 which revealed 99% distal RCA occlusion s/p DES, otherwise nonobstructive CAD. She was started on aspirin, Brilinta, statin, and metoprolol.  She was last seen in the office on 06/24/2022 and was stable from a cardiac standpoint. She denied symptoms concerning for angina.    She presents today for follow-up accompanied by her grandson.  Since her last visit she has been stable from a cardiac standpoint.  She denies any symptoms concerning for angina.  She does note that over the past few days she has felt a little more fatigued and has wanted to sleep more.  She also notes some very mild dependent bilateral ankle edema.  She denies dyspnea, PND, orthopnea, weight gain.  Overall,  she reports feeling well.  Home Medications    Current Outpatient Medications  Medication Sig Dispense Refill   acetaminophen (TYLENOL) 500 MG tablet Take 1,000 mg by mouth every 6 (six) hours as needed.     amLODipine (NORVASC) 2.5 MG tablet Take 2.5 mg by mouth daily.     aspirin EC 81 MG tablet Take 1 tablet (81 mg total) by mouth daily. Swallow whole. 30 tablet 12   atorvastatin (LIPITOR) 40 MG tablet Take 1 tablet (40 mg total) by mouth daily. 30 tablet 11   brimonidine (ALPHAGAN) 0.2 % ophthalmic solution Place 1 drop into both eyes 3 (three) times daily.     dorzolamide-timolol (COSOPT) 22.3-6.8 MG/ML ophthalmic solution 1 drop 2 (two) times daily.       latanoprost (XALATAN) 0.005 % ophthalmic solution Place 1 drop into both eyes every evening.     methocarbamol (ROBAXIN) 500 MG tablet Take 1 tablet (500 mg total) by mouth 3 (three) times daily. 30 tablet 3   metoprolol tartrate (LOPRESSOR) 25  MG tablet Take 0.5 tablets (12.5 mg total) by mouth 2 (two) times daily. 60 tablet 2   nitroGLYCERIN (NITROSTAT) 0.4 MG SL tablet Place 1 tablet (0.4 mg total) under the tongue every 5 (five) minutes as needed for chest pain. 30 tablet 12   OPTIVE 0.5-0.9 % ophthalmic solution Place 1 drop into both eyes 4 (four) times daily as needed for dry eyes.     pantoprazole (PROTONIX) 40 MG tablet Take 1 tablet (40 mg total) by mouth daily. 30 tablet 0   RHOPRESSA 0.02 % SOLN Place 1 drop into both eyes every evening.     ticagrelor (BRILINTA) 90 MG TABS tablet Take 1 tablet (90 mg total) by mouth 2 (two) times daily. 60 tablet 2   Travoprost, BAK Free, (TRAVATAMN) 0.004 % SOLN ophthalmic solution Place 1 drop into both eyes at bedtime.      No current facility-administered medications for this visit.     Review of Systems    He denies chest pain, palpitations, dyspnea, pnd, orthopnea, n, v, dizziness, syncope, edema, weight gain, or early satiety. All other systems reviewed and are otherwise negative  except as noted above.   Physical Exam    VS:  BP (!) 145/78   Pulse (!) 53   Ht '4\' 11"'$  (1.499 m)   Wt 129 lb 6.4 oz (58.7 kg)   SpO2 98%   BMI 26.14 kg/m  GEN: Well nourished, well developed, in no acute distress. HEENT: normal. Neck: Supple, no JVD, carotid bruits, or masses. Cardiac: RRR, no murmurs, rubs, or gallops. No clubbing, cyanosis, edema.  Radials/DP/PT 2+ and equal bilaterally.  Respiratory:  Respirations regular and unlabored, clear to auscultation bilaterally. GI: Soft, nontender, nondistended, BS + x 4. MS: no deformity or atrophy. Skin: warm and dry, no rash. Neuro:  Strength and sensation are intact. Psych: Normal affect.  Accessory Clinical Findings    ECG personally reviewed by me today - No EKG in office today.     Lab Results  Component Value Date   WBC 10.4 06/09/2022   HGB 12.2 06/09/2022   HCT 36.0 06/09/2022   MCV 94.5 06/09/2022   PLT 223 06/09/2022   Lab Results  Component Value Date   CREATININE 1.60 (H) 06/09/2022   BUN 20 06/09/2022   NA 140 06/09/2022   K 4.0 06/09/2022   CL 113 (H) 06/09/2022   CO2 21 (L) 06/09/2022   Lab Results  Component Value Date   ALT 11 06/05/2022   AST 17 06/05/2022   ALKPHOS 60 06/05/2022   BILITOT 0.5 06/05/2022   Lab Results  Component Value Date   CHOL 225 (H) 12/29/2021   HDL 72 12/29/2021   LDLCALC 130 (H) 12/29/2021   TRIG 114 12/29/2021   CHOLHDL 3.1 12/29/2021    Lab Results  Component Value Date   HGBA1C 5.5 12/29/2021    Assessment & Plan   1. CAD: S/p NSTEMI, DES-RCA in 05/2022. Stable without anginal symptoms.  Continue aspirin, Brilinta, amlodipine, metoprolol, and Lipitor.   2. Hypertension: BP mildly elevated in office today.  Generally well-controlled at home.   Continue current antihypertensive regimen.    3. Hyperlipidemia: LDL was 64 in 06/2022.  Continue aspirin, Lipitor.   4. History of CVA: No documented recurrence.  Continue aspirin, Lipitor.   5. CKD stage IIIb:  Creatinine was 1.94 in 06/2022. Stable.    6. Disposition: Follow-up in 6 months with Dr. Stanford Breed.  Lenna Sciara, NP 09/24/2022, 12:09 PM

## 2022-09-24 NOTE — Patient Instructions (Signed)
Medication Instructions:  Your physician recommends that you continue on your current medications as directed. Please refer to the Current Medication list given to you today.   *If you need a refill on your cardiac medications before your next appointment, please call your pharmacy*   Lab Work: NONE ordered at this time of appointment   If you have labs (blood work) drawn today and your tests are completely normal, you will receive your results only by: [place] (if you have MyChart) OR A paper copy in the mail If you have any lab test that is abnormal or we need to change your treatment, we will call you to review the results.   Testing/Procedures: NONE ordered at this time of appointment     Follow-Up: At [facility], you and your health needs are our priority.  As part of our continuing mission to provide you with exceptional heart care, we have created designated Provider Care Teams.  These Care Teams include your primary Cardiologist (physician) and Advanced Practice Providers (APPs -  Physician Assistants and Nurse Practitioners) who all work together to provide you with the care you need, when you need it.  We recommend signing up for the patient portal called "MyChart".  Sign up information is provided on this After Visit Summary.  MyChart is used to connect with patients for Virtual Visits (Telemedicine).  Patients are able to view lab/test results, encounter notes, upcoming appointments, etc.  Non-urgent messages can be sent to your provider as well.   To learn more about what you can do with MyChart, go to [email or web address].    Your next appointment:   6 month(s)  Provider:   Kirk Ruths, MD     Other Instructions

## 2022-10-01 DIAGNOSIS — J441 Chronic obstructive pulmonary disease with (acute) exacerbation: Secondary | ICD-10-CM | POA: Diagnosis not present

## 2022-10-07 DIAGNOSIS — H401133 Primary open-angle glaucoma, bilateral, severe stage: Secondary | ICD-10-CM | POA: Diagnosis not present

## 2022-11-03 DIAGNOSIS — S9002XA Contusion of left ankle, initial encounter: Secondary | ICD-10-CM | POA: Diagnosis not present

## 2022-11-04 DIAGNOSIS — R58 Hemorrhage, not elsewhere classified: Secondary | ICD-10-CM | POA: Diagnosis not present

## 2022-11-04 DIAGNOSIS — I69354 Hemiplegia and hemiparesis following cerebral infarction affecting left non-dominant side: Secondary | ICD-10-CM | POA: Diagnosis not present

## 2022-11-04 DIAGNOSIS — N184 Chronic kidney disease, stage 4 (severe): Secondary | ICD-10-CM | POA: Diagnosis not present

## 2022-11-04 DIAGNOSIS — Z9989 Dependence on other enabling machines and devices: Secondary | ICD-10-CM | POA: Diagnosis not present

## 2022-11-04 DIAGNOSIS — I959 Hypotension, unspecified: Secondary | ICD-10-CM | POA: Diagnosis not present

## 2022-11-04 DIAGNOSIS — R609 Edema, unspecified: Secondary | ICD-10-CM | POA: Diagnosis not present

## 2022-11-09 DIAGNOSIS — H401133 Primary open-angle glaucoma, bilateral, severe stage: Secondary | ICD-10-CM | POA: Diagnosis not present

## 2022-11-20 DIAGNOSIS — S299XXA Unspecified injury of thorax, initial encounter: Secondary | ICD-10-CM | POA: Diagnosis not present

## 2022-11-20 DIAGNOSIS — S72009A Fracture of unspecified part of neck of unspecified femur, initial encounter for closed fracture: Secondary | ICD-10-CM | POA: Diagnosis not present

## 2022-11-20 DIAGNOSIS — S199XXA Unspecified injury of neck, initial encounter: Secondary | ICD-10-CM | POA: Diagnosis not present

## 2022-11-20 DIAGNOSIS — Z751 Person awaiting admission to adequate facility elsewhere: Secondary | ICD-10-CM | POA: Diagnosis not present

## 2022-11-20 DIAGNOSIS — G8929 Other chronic pain: Secondary | ICD-10-CM | POA: Diagnosis not present

## 2022-11-20 DIAGNOSIS — Z955 Presence of coronary angioplasty implant and graft: Secondary | ICD-10-CM | POA: Diagnosis not present

## 2022-11-20 DIAGNOSIS — S0990XA Unspecified injury of head, initial encounter: Secondary | ICD-10-CM | POA: Diagnosis not present

## 2022-11-20 DIAGNOSIS — S72142A Displaced intertrochanteric fracture of left femur, initial encounter for closed fracture: Secondary | ICD-10-CM | POA: Diagnosis not present

## 2022-11-20 DIAGNOSIS — S72141A Displaced intertrochanteric fracture of right femur, initial encounter for closed fracture: Secondary | ICD-10-CM | POA: Diagnosis not present

## 2022-11-20 DIAGNOSIS — W182XXA Fall in (into) shower or empty bathtub, initial encounter: Secondary | ICD-10-CM | POA: Diagnosis not present

## 2022-11-20 DIAGNOSIS — Z8673 Personal history of transient ischemic attack (TIA), and cerebral infarction without residual deficits: Secondary | ICD-10-CM | POA: Diagnosis not present

## 2022-11-20 DIAGNOSIS — S8992XA Unspecified injury of left lower leg, initial encounter: Secondary | ICD-10-CM | POA: Diagnosis not present

## 2022-11-20 DIAGNOSIS — I252 Old myocardial infarction: Secondary | ICD-10-CM | POA: Diagnosis not present

## 2022-11-20 DIAGNOSIS — D62 Acute posthemorrhagic anemia: Secondary | ICD-10-CM | POA: Diagnosis not present

## 2022-11-20 DIAGNOSIS — M79605 Pain in left leg: Secondary | ICD-10-CM | POA: Diagnosis not present

## 2022-11-20 DIAGNOSIS — N179 Acute kidney failure, unspecified: Secondary | ICD-10-CM | POA: Diagnosis not present

## 2022-11-20 DIAGNOSIS — E785 Hyperlipidemia, unspecified: Secondary | ICD-10-CM | POA: Diagnosis not present

## 2022-11-20 DIAGNOSIS — R2689 Other abnormalities of gait and mobility: Secondary | ICD-10-CM | POA: Diagnosis not present

## 2022-11-20 DIAGNOSIS — I129 Hypertensive chronic kidney disease with stage 1 through stage 4 chronic kidney disease, or unspecified chronic kidney disease: Secondary | ICD-10-CM | POA: Diagnosis not present

## 2022-11-20 DIAGNOSIS — Z7982 Long term (current) use of aspirin: Secondary | ICD-10-CM | POA: Diagnosis not present

## 2022-11-20 DIAGNOSIS — Z01818 Encounter for other preprocedural examination: Secondary | ICD-10-CM | POA: Diagnosis not present

## 2022-11-20 DIAGNOSIS — Z743 Need for continuous supervision: Secondary | ICD-10-CM | POA: Diagnosis not present

## 2022-11-20 DIAGNOSIS — Z7902 Long term (current) use of antithrombotics/antiplatelets: Secondary | ICD-10-CM | POA: Diagnosis not present

## 2022-11-20 DIAGNOSIS — I2510 Atherosclerotic heart disease of native coronary artery without angina pectoris: Secondary | ICD-10-CM | POA: Diagnosis not present

## 2022-11-20 DIAGNOSIS — S99912A Unspecified injury of left ankle, initial encounter: Secondary | ICD-10-CM | POA: Diagnosis not present

## 2022-11-20 DIAGNOSIS — N1832 Chronic kidney disease, stage 3b: Secondary | ICD-10-CM | POA: Diagnosis not present

## 2022-11-20 DIAGNOSIS — S72142D Displaced intertrochanteric fracture of left femur, subsequent encounter for closed fracture with routine healing: Secondary | ICD-10-CM | POA: Diagnosis not present

## 2022-11-21 DIAGNOSIS — S72142A Displaced intertrochanteric fracture of left femur, initial encounter for closed fracture: Secondary | ICD-10-CM | POA: Diagnosis not present

## 2022-11-21 DIAGNOSIS — N1832 Chronic kidney disease, stage 3b: Secondary | ICD-10-CM | POA: Diagnosis not present

## 2022-11-21 DIAGNOSIS — I2510 Atherosclerotic heart disease of native coronary artery without angina pectoris: Secondary | ICD-10-CM | POA: Diagnosis not present

## 2022-11-23 ENCOUNTER — Telehealth: Payer: Self-pay | Admitting: Orthopaedic Surgery

## 2022-11-23 ENCOUNTER — Telehealth (HOSPITAL_BASED_OUTPATIENT_CLINIC_OR_DEPARTMENT_OTHER): Payer: Self-pay | Admitting: Orthopaedic Surgery

## 2022-11-23 DIAGNOSIS — Z01818 Encounter for other preprocedural examination: Secondary | ICD-10-CM | POA: Diagnosis not present

## 2022-11-23 NOTE — Telephone Encounter (Signed)
Patient daughter called in stating they need assistance finding her a Rehab facility she just had surgery this past Saturday, please advise she would like a call or a list of places emailed to her [email or web address] .Aliyah Abeyta (Daughter) [phone]

## 2022-11-23 NOTE — Telephone Encounter (Signed)
Thank you :)

## 2022-11-24 ENCOUNTER — Telehealth: Payer: Self-pay | Admitting: *Deleted

## 2022-11-24 NOTE — Telephone Encounter (Signed)
Call to family after receiving message that they would like to get their mother to [facility] for SNF short term rehab. She is from [place] and had a fall while in [place] for a wedding, and broke her hip. She underwent fixation with IM nailing to the hip and is WBAT. Family is trying to get her back to [facility] to [facility] for rehab. Provided information on how to locate SNFs with rehabs and phone numbers via Medicare.gov website. Also explained process from CM or SW there in the hospital she is currently in and they should let the hospital staff be aware that she will either drive or fly and have her Orthopedic MD clear this before traveling. CM or discharge planning staff can contact the SW/admission person for whatever SNF they choose to do a direct admit from the hospital there in [place]. Family appreciative of help. Typically see Dr. Steward Drone for Orthopedic needs per family.

## 2022-11-25 ENCOUNTER — Telehealth: Payer: Self-pay | Admitting: Orthopaedic Surgery

## 2022-11-25 NOTE — Telephone Encounter (Signed)
Dr. Brantley Fling In [place] wanting to speak to Dr. Steward Drone about patient, please call [phone]

## 2022-11-26 NOTE — Telephone Encounter (Signed)
Spoke to family and discussed SNFs in our area and how they would go about the process from [place] in getting her home.

## 2022-12-02 ENCOUNTER — Telehealth: Payer: Self-pay | Admitting: Orthopaedic Surgery

## 2022-12-02 NOTE — Telephone Encounter (Signed)
Received call from Angie Coley-(PT) with [facility] advised patient fell and broke hip in [place]. Angie advised patient was discharges from the hospital over the weekend. Angie asked if Dr. Steward Drone will give verbal orders for HHPT for the patient? Angie advised they could not get orders because they will not give orders outside of [place]. Angie advised they are trying to get someone out to see the patient today for eval.  The number to contact Angie is [phone]

## 2022-12-03 ENCOUNTER — Telehealth: Payer: Self-pay | Admitting: Orthopaedic Surgery

## 2022-12-03 DIAGNOSIS — N1832 Chronic kidney disease, stage 3b: Secondary | ICD-10-CM | POA: Diagnosis not present

## 2022-12-03 DIAGNOSIS — S72002A Fracture of unspecified part of neck of left femur, initial encounter for closed fracture: Secondary | ICD-10-CM | POA: Diagnosis not present

## 2022-12-03 DIAGNOSIS — D62 Acute posthemorrhagic anemia: Secondary | ICD-10-CM | POA: Diagnosis not present

## 2022-12-03 DIAGNOSIS — I129 Hypertensive chronic kidney disease with stage 1 through stage 4 chronic kidney disease, or unspecified chronic kidney disease: Secondary | ICD-10-CM | POA: Diagnosis not present

## 2022-12-03 DIAGNOSIS — I959 Hypotension, unspecified: Secondary | ICD-10-CM | POA: Diagnosis not present

## 2022-12-03 DIAGNOSIS — R410 Disorientation, unspecified: Secondary | ICD-10-CM | POA: Diagnosis not present

## 2022-12-03 DIAGNOSIS — I2510 Atherosclerotic heart disease of native coronary artery without angina pectoris: Secondary | ICD-10-CM | POA: Diagnosis not present

## 2022-12-03 DIAGNOSIS — Z9989 Dependence on other enabling machines and devices: Secondary | ICD-10-CM | POA: Diagnosis not present

## 2022-12-03 DIAGNOSIS — S72142D Displaced intertrochanteric fracture of left femur, subsequent encounter for closed fracture with routine healing: Secondary | ICD-10-CM | POA: Diagnosis not present

## 2022-12-03 NOTE — Telephone Encounter (Signed)
[facility] asking for verbal orders for P/T--2wx6, 1wx1, ( [place]) [phone]

## 2022-12-08 ENCOUNTER — Telehealth: Payer: Self-pay

## 2022-12-08 DIAGNOSIS — I129 Hypertensive chronic kidney disease with stage 1 through stage 4 chronic kidney disease, or unspecified chronic kidney disease: Secondary | ICD-10-CM | POA: Diagnosis not present

## 2022-12-08 DIAGNOSIS — D62 Acute posthemorrhagic anemia: Secondary | ICD-10-CM | POA: Diagnosis not present

## 2022-12-08 DIAGNOSIS — S72142D Displaced intertrochanteric fracture of left femur, subsequent encounter for closed fracture with routine healing: Secondary | ICD-10-CM | POA: Diagnosis not present

## 2022-12-08 DIAGNOSIS — N1832 Chronic kidney disease, stage 3b: Secondary | ICD-10-CM | POA: Diagnosis not present

## 2022-12-08 DIAGNOSIS — I2510 Atherosclerotic heart disease of native coronary artery without angina pectoris: Secondary | ICD-10-CM | POA: Diagnosis not present

## 2022-12-08 DIAGNOSIS — I959 Hypotension, unspecified: Secondary | ICD-10-CM | POA: Diagnosis not present

## 2022-12-08 NOTE — Telephone Encounter (Signed)
The patient's daughter, Laurie [name] (same as patient's name) called to get some advice. The patient has an appointment for postop check of her hip tomorrow with Dr. Steward Drone. Today though, they have noticed new blood through the bandage and are concerned about it. The daughter can be reached at either [phone] or [phone].

## 2022-12-09 ENCOUNTER — Ambulatory Visit (INDEPENDENT_AMBULATORY_CARE_PROVIDER_SITE_OTHER): Payer: Medicare Other

## 2022-12-09 ENCOUNTER — Ambulatory Visit (INDEPENDENT_AMBULATORY_CARE_PROVIDER_SITE_OTHER): Payer: Medicare Other | Admitting: Orthopaedic Surgery

## 2022-12-09 DIAGNOSIS — W19XXXD Unspecified fall, subsequent encounter: Secondary | ICD-10-CM

## 2022-12-09 DIAGNOSIS — M7989 Other specified soft tissue disorders: Secondary | ICD-10-CM | POA: Diagnosis not present

## 2022-12-09 DIAGNOSIS — M25552 Pain in left hip: Secondary | ICD-10-CM | POA: Diagnosis not present

## 2022-12-09 NOTE — Progress Notes (Signed)
Post Operative Evaluation    Procedure/Date of Surgery: Status post left hip cephalomedullary nail 4/27  Interval History:   Presents today 2 weeks status post left hip nailing.  She has been working with home physical therapy which just began.  Overall she is progressing her weightbearing and putting weight on the hip there was some staining on the dressing which her son was concerned about.  She is here today for further discussion.   PMH/PSH/Family History/Social History/Meds/Allergies:    Past Medical History:  Diagnosis Date   CKD (chronic kidney disease), stage III (HCC)    Family history of breast cancer    mother   Family history of colon cancer    father   Glaucoma    Osteopenia    SBO (small bowel obstruction) (HCC)    Past Surgical History:  Procedure Laterality Date   APPENDECTOMY     CESAREAN SECTION     x6   CORONARY STENT INTERVENTION N/A 06/08/2022   Procedure: CORONARY STENT INTERVENTION;  Surgeon: Lennette Bihari, MD;  [facility];  Service: Cardiovascular;  Laterality: N/A;   ESOPHAGOGASTRODUODENOSCOPY  07/07/2012   Procedure: ESOPHAGOGASTRODUODENOSCOPY (EGD);  Surgeon: Willis Modena, MD;  Location: [facility] ENDOSCOPY;  Service: Endoscopy;  Laterality: N/A;   GLAUCOMA SURGERY Bilateral 02/17/2022   LEFT COLECTOMY  04/30/2011   Sr Streck   LEFT HEART CATH AND CORONARY ANGIOGRAPHY N/A 06/08/2022   Procedure: LEFT HEART CATH AND CORONARY ANGIOGRAPHY;  Surgeon: Lennette Bihari, MD;  Location: [facility];  Service: Cardiovascular;  Laterality: N/A;   tubal cyst  04/30/2011   L fallopian tube cyst incidentally found   Social History   Socioeconomic History   Marital status: Widowed    Spouse name: Not on file   Number of children: Not on file   Years of education: Not on file   Highest education level: Not on file  Occupational History   Not on file  Tobacco Use   Smoking status: Former     Packs/day: .5    Types: Cigarettes   Smokeless tobacco: Not on file  Vaping Use   Vaping Use: Not on file  Substance and Sexual Activity   Alcohol use: No    Comment: rare   Drug use: No   Sexual activity: Not Currently  Other Topics Concern   Not on file  Social History Narrative   Not on file   Social Determinants of Health   Financial Resource Strain: Not on file  Food Insecurity: No Food Insecurity (06/09/2022)   Hunger Vital Sign    Worried About Running Out of Food in the Last Year: Never true    Ran Out of Food in the Last Year: Never true  Transportation Needs: No Transportation Needs (06/09/2022)   PRAPARE - Administrator, Civil Service (Medical): No    Lack of Transportation (Non-Medical): No  Physical Activity: Not on file  Stress: Not on file  Social Connections: Not on file   Family History  Problem Relation Age of Onset   Breast cancer Mother    Colon cancer Father    Diabetes Sister    Allergies  Allergen Reactions   Actonel [Risedronate] Nausea And Vomiting   Fosamax [Alendronate] Nausea And Vomiting   Miacalcin Other (See Comments)  Unknown reaction   Neomycin Other (See Comments)    Unknown reaction   Neosporin [Neomycin-Polymyxin-Gramicidin] Swelling   Neurontin [Gabapentin] Other (See Comments)    Confusion   Codeine Palpitations   Current Outpatient Medications  Medication Sig Dispense Refill   acetaminophen (TYLENOL) 500 MG tablet Take 1,000 mg by mouth every 6 (six) hours as needed.     amLODipine (NORVASC) 2.5 MG tablet Take 2.5 mg by mouth daily.     aspirin EC 81 MG tablet Take 1 tablet (81 mg total) by mouth daily. Swallow whole. 30 tablet 12   atorvastatin (LIPITOR) 40 MG tablet Take 1 tablet (40 mg total) by mouth daily. 30 tablet 11   brimonidine (ALPHAGAN) 0.2 % ophthalmic solution Place 1 drop into both eyes 3 (three) times daily.     dorzolamide-timolol (COSOPT) 22.3-6.8 MG/ML ophthalmic solution 1 drop 2 (two)  times daily.       latanoprost (XALATAN) 0.005 % ophthalmic solution Place 1 drop into both eyes every evening.     methocarbamol (ROBAXIN) 500 MG tablet Take 1 tablet (500 mg total) by mouth 3 (three) times daily. 30 tablet 3   metoprolol tartrate (LOPRESSOR) 25 MG tablet Take 0.5 tablets (12.5 mg total) by mouth 2 (two) times daily. 60 tablet 2   nitroGLYCERIN (NITROSTAT) 0.4 MG SL tablet Place 1 tablet (0.4 mg total) under the tongue every 5 (five) minutes as needed for chest pain. 30 tablet 12   OPTIVE 0.5-0.9 % ophthalmic solution Place 1 drop into both eyes 4 (four) times daily as needed for dry eyes.     pantoprazole (PROTONIX) 40 MG tablet Take 1 tablet (40 mg total) by mouth daily. 30 tablet 0   RHOPRESSA 0.02 % SOLN Place 1 drop into both eyes every evening.     ticagrelor (BRILINTA) 90 MG TABS tablet Take 1 tablet (90 mg total) by mouth 2 (two) times daily. 60 tablet 2   Travoprost, BAK Free, (TRAVATAMN) 0.004 % SOLN ophthalmic solution Place 1 drop into both eyes at bedtime.      No current facility-administered medications for this visit.   No results found.  Review of Systems:   A ROS was performed including pertinent positives and negatives as documented in the HPI.   Musculoskeletal Exam:    There were no vitals taken for this visit.  Left hip wounds are well-appearing without erythema or drainage.  Able to actively flex at the left hip.  Sitting in a wheelchair.  30 degrees internal/external rotation of the hip without pain  Imaging:    3 views left hip: No evidence of hardware failure  I personally reviewed and interpreted the radiographs.   Assessment:   2 weeks status post left hip cephalomedullary nailing overall doing well.  At this time she will continue to advance her weightbearing and progress with home physical therapy.  I will see her back in 6 weeks for reassessment.  At that time can consider outpatient physical therapy  Plan :    -Return to clinic  in 6 weeks      I personally saw and evaluated the patient, and participated in the management and treatment plan.  Huel Cote, MD Attending Physician, Orthopedic Surgery  This document was dictated using Dragon voice recognition software. A reasonable attempt at proof reading has been made to minimize errors.

## 2022-12-10 ENCOUNTER — Other Ambulatory Visit: Payer: Self-pay | Admitting: Family Medicine

## 2022-12-10 ENCOUNTER — Ambulatory Visit (HOSPITAL_BASED_OUTPATIENT_CLINIC_OR_DEPARTMENT_OTHER): Payer: Medicare Other | Admitting: Orthopaedic Surgery

## 2022-12-10 DIAGNOSIS — Z1231 Encounter for screening mammogram for malignant neoplasm of breast: Secondary | ICD-10-CM

## 2022-12-11 DIAGNOSIS — I129 Hypertensive chronic kidney disease with stage 1 through stage 4 chronic kidney disease, or unspecified chronic kidney disease: Secondary | ICD-10-CM | POA: Diagnosis not present

## 2022-12-11 DIAGNOSIS — N1832 Chronic kidney disease, stage 3b: Secondary | ICD-10-CM | POA: Diagnosis not present

## 2022-12-11 DIAGNOSIS — D62 Acute posthemorrhagic anemia: Secondary | ICD-10-CM | POA: Diagnosis not present

## 2022-12-11 DIAGNOSIS — I2510 Atherosclerotic heart disease of native coronary artery without angina pectoris: Secondary | ICD-10-CM | POA: Diagnosis not present

## 2022-12-11 DIAGNOSIS — I959 Hypotension, unspecified: Secondary | ICD-10-CM | POA: Diagnosis not present

## 2022-12-11 DIAGNOSIS — S72142D Displaced intertrochanteric fracture of left femur, subsequent encounter for closed fracture with routine healing: Secondary | ICD-10-CM | POA: Diagnosis not present

## 2022-12-15 ENCOUNTER — Telehealth: Payer: Self-pay | Admitting: Orthopaedic Surgery

## 2022-12-15 DIAGNOSIS — I2510 Atherosclerotic heart disease of native coronary artery without angina pectoris: Secondary | ICD-10-CM | POA: Diagnosis not present

## 2022-12-15 DIAGNOSIS — I959 Hypotension, unspecified: Secondary | ICD-10-CM | POA: Diagnosis not present

## 2022-12-15 DIAGNOSIS — D62 Acute posthemorrhagic anemia: Secondary | ICD-10-CM | POA: Diagnosis not present

## 2022-12-15 DIAGNOSIS — N1832 Chronic kidney disease, stage 3b: Secondary | ICD-10-CM | POA: Diagnosis not present

## 2022-12-15 DIAGNOSIS — I129 Hypertensive chronic kidney disease with stage 1 through stage 4 chronic kidney disease, or unspecified chronic kidney disease: Secondary | ICD-10-CM | POA: Diagnosis not present

## 2022-12-15 DIAGNOSIS — S72142D Displaced intertrochanteric fracture of left femur, subsequent encounter for closed fracture with routine healing: Secondary | ICD-10-CM | POA: Diagnosis not present

## 2022-12-15 NOTE — Telephone Encounter (Signed)
[facility] requesting verbal orders for O/T--1wx4, for Adl, balance and upper extremities. Please call Danielle Dess)- [phone]

## 2022-12-15 NOTE — Telephone Encounter (Signed)
Returned call and gave verbal orders.

## 2022-12-18 DIAGNOSIS — N1832 Chronic kidney disease, stage 3b: Secondary | ICD-10-CM | POA: Diagnosis not present

## 2022-12-18 DIAGNOSIS — D62 Acute posthemorrhagic anemia: Secondary | ICD-10-CM | POA: Diagnosis not present

## 2022-12-18 DIAGNOSIS — I129 Hypertensive chronic kidney disease with stage 1 through stage 4 chronic kidney disease, or unspecified chronic kidney disease: Secondary | ICD-10-CM | POA: Diagnosis not present

## 2022-12-18 DIAGNOSIS — I959 Hypotension, unspecified: Secondary | ICD-10-CM | POA: Diagnosis not present

## 2022-12-18 DIAGNOSIS — S72142D Displaced intertrochanteric fracture of left femur, subsequent encounter for closed fracture with routine healing: Secondary | ICD-10-CM | POA: Diagnosis not present

## 2022-12-18 DIAGNOSIS — I2510 Atherosclerotic heart disease of native coronary artery without angina pectoris: Secondary | ICD-10-CM | POA: Diagnosis not present

## 2022-12-22 DIAGNOSIS — S72142D Displaced intertrochanteric fracture of left femur, subsequent encounter for closed fracture with routine healing: Secondary | ICD-10-CM | POA: Diagnosis not present

## 2022-12-22 DIAGNOSIS — D62 Acute posthemorrhagic anemia: Secondary | ICD-10-CM | POA: Diagnosis not present

## 2022-12-22 DIAGNOSIS — I959 Hypotension, unspecified: Secondary | ICD-10-CM | POA: Diagnosis not present

## 2022-12-22 DIAGNOSIS — N1832 Chronic kidney disease, stage 3b: Secondary | ICD-10-CM | POA: Diagnosis not present

## 2022-12-22 DIAGNOSIS — I129 Hypertensive chronic kidney disease with stage 1 through stage 4 chronic kidney disease, or unspecified chronic kidney disease: Secondary | ICD-10-CM | POA: Diagnosis not present

## 2022-12-22 DIAGNOSIS — I2510 Atherosclerotic heart disease of native coronary artery without angina pectoris: Secondary | ICD-10-CM | POA: Diagnosis not present

## 2022-12-23 DIAGNOSIS — I129 Hypertensive chronic kidney disease with stage 1 through stage 4 chronic kidney disease, or unspecified chronic kidney disease: Secondary | ICD-10-CM | POA: Diagnosis not present

## 2022-12-23 DIAGNOSIS — S72142D Displaced intertrochanteric fracture of left femur, subsequent encounter for closed fracture with routine healing: Secondary | ICD-10-CM | POA: Diagnosis not present

## 2022-12-23 DIAGNOSIS — D62 Acute posthemorrhagic anemia: Secondary | ICD-10-CM | POA: Diagnosis not present

## 2022-12-23 DIAGNOSIS — I959 Hypotension, unspecified: Secondary | ICD-10-CM | POA: Diagnosis not present

## 2022-12-23 DIAGNOSIS — N1832 Chronic kidney disease, stage 3b: Secondary | ICD-10-CM | POA: Diagnosis not present

## 2022-12-23 DIAGNOSIS — I2510 Atherosclerotic heart disease of native coronary artery without angina pectoris: Secondary | ICD-10-CM | POA: Diagnosis not present

## 2022-12-25 DIAGNOSIS — H401133 Primary open-angle glaucoma, bilateral, severe stage: Secondary | ICD-10-CM | POA: Diagnosis not present

## 2022-12-25 DIAGNOSIS — H353131 Nonexudative age-related macular degeneration, bilateral, early dry stage: Secondary | ICD-10-CM | POA: Diagnosis not present

## 2022-12-28 DIAGNOSIS — I959 Hypotension, unspecified: Secondary | ICD-10-CM | POA: Diagnosis not present

## 2022-12-28 DIAGNOSIS — N1832 Chronic kidney disease, stage 3b: Secondary | ICD-10-CM | POA: Diagnosis not present

## 2022-12-28 DIAGNOSIS — I2510 Atherosclerotic heart disease of native coronary artery without angina pectoris: Secondary | ICD-10-CM | POA: Diagnosis not present

## 2022-12-28 DIAGNOSIS — I129 Hypertensive chronic kidney disease with stage 1 through stage 4 chronic kidney disease, or unspecified chronic kidney disease: Secondary | ICD-10-CM | POA: Diagnosis not present

## 2022-12-28 DIAGNOSIS — S72142D Displaced intertrochanteric fracture of left femur, subsequent encounter for closed fracture with routine healing: Secondary | ICD-10-CM | POA: Diagnosis not present

## 2022-12-28 DIAGNOSIS — D62 Acute posthemorrhagic anemia: Secondary | ICD-10-CM | POA: Diagnosis not present

## 2022-12-30 DIAGNOSIS — N1832 Chronic kidney disease, stage 3b: Secondary | ICD-10-CM | POA: Diagnosis not present

## 2022-12-30 DIAGNOSIS — I2510 Atherosclerotic heart disease of native coronary artery without angina pectoris: Secondary | ICD-10-CM | POA: Diagnosis not present

## 2022-12-30 DIAGNOSIS — I959 Hypotension, unspecified: Secondary | ICD-10-CM | POA: Diagnosis not present

## 2022-12-30 DIAGNOSIS — S72142D Displaced intertrochanteric fracture of left femur, subsequent encounter for closed fracture with routine healing: Secondary | ICD-10-CM | POA: Diagnosis not present

## 2022-12-30 DIAGNOSIS — D62 Acute posthemorrhagic anemia: Secondary | ICD-10-CM | POA: Diagnosis not present

## 2022-12-30 DIAGNOSIS — I129 Hypertensive chronic kidney disease with stage 1 through stage 4 chronic kidney disease, or unspecified chronic kidney disease: Secondary | ICD-10-CM | POA: Diagnosis not present

## 2023-01-01 ENCOUNTER — Ambulatory Visit
Admission: RE | Admit: 2023-01-01 | Discharge: 2023-01-01 | Disposition: A | Discharge status: Home / Self Care | Payer: Medicare Other | Source: Ambulatory Visit | Attending: Family Medicine | Admitting: Family Medicine

## 2023-01-01 DIAGNOSIS — Z1231 Encounter for screening mammogram for malignant neoplasm of breast: Secondary | ICD-10-CM | POA: Diagnosis not present

## 2023-01-04 DIAGNOSIS — S72142D Displaced intertrochanteric fracture of left femur, subsequent encounter for closed fracture with routine healing: Secondary | ICD-10-CM | POA: Diagnosis not present

## 2023-01-04 DIAGNOSIS — I959 Hypotension, unspecified: Secondary | ICD-10-CM | POA: Diagnosis not present

## 2023-01-04 DIAGNOSIS — I129 Hypertensive chronic kidney disease with stage 1 through stage 4 chronic kidney disease, or unspecified chronic kidney disease: Secondary | ICD-10-CM | POA: Diagnosis not present

## 2023-01-04 DIAGNOSIS — D62 Acute posthemorrhagic anemia: Secondary | ICD-10-CM | POA: Diagnosis not present

## 2023-01-04 DIAGNOSIS — I2510 Atherosclerotic heart disease of native coronary artery without angina pectoris: Secondary | ICD-10-CM | POA: Diagnosis not present

## 2023-01-04 DIAGNOSIS — N1832 Chronic kidney disease, stage 3b: Secondary | ICD-10-CM | POA: Diagnosis not present

## 2023-01-05 DIAGNOSIS — I2510 Atherosclerotic heart disease of native coronary artery without angina pectoris: Secondary | ICD-10-CM | POA: Diagnosis not present

## 2023-01-05 DIAGNOSIS — I959 Hypotension, unspecified: Secondary | ICD-10-CM | POA: Diagnosis not present

## 2023-01-05 DIAGNOSIS — I129 Hypertensive chronic kidney disease with stage 1 through stage 4 chronic kidney disease, or unspecified chronic kidney disease: Secondary | ICD-10-CM | POA: Diagnosis not present

## 2023-01-05 DIAGNOSIS — N1832 Chronic kidney disease, stage 3b: Secondary | ICD-10-CM | POA: Diagnosis not present

## 2023-01-05 DIAGNOSIS — S72142D Displaced intertrochanteric fracture of left femur, subsequent encounter for closed fracture with routine healing: Secondary | ICD-10-CM | POA: Diagnosis not present

## 2023-01-05 DIAGNOSIS — D62 Acute posthemorrhagic anemia: Secondary | ICD-10-CM | POA: Diagnosis not present

## 2023-01-07 DIAGNOSIS — S72142D Displaced intertrochanteric fracture of left femur, subsequent encounter for closed fracture with routine healing: Secondary | ICD-10-CM | POA: Diagnosis not present

## 2023-01-07 DIAGNOSIS — N1832 Chronic kidney disease, stage 3b: Secondary | ICD-10-CM | POA: Diagnosis not present

## 2023-01-07 DIAGNOSIS — I129 Hypertensive chronic kidney disease with stage 1 through stage 4 chronic kidney disease, or unspecified chronic kidney disease: Secondary | ICD-10-CM | POA: Diagnosis not present

## 2023-01-07 DIAGNOSIS — I959 Hypotension, unspecified: Secondary | ICD-10-CM | POA: Diagnosis not present

## 2023-01-07 DIAGNOSIS — I2510 Atherosclerotic heart disease of native coronary artery without angina pectoris: Secondary | ICD-10-CM | POA: Diagnosis not present

## 2023-01-07 DIAGNOSIS — D62 Acute posthemorrhagic anemia: Secondary | ICD-10-CM | POA: Diagnosis not present

## 2023-01-12 DIAGNOSIS — S81802A Unspecified open wound, left lower leg, initial encounter: Secondary | ICD-10-CM | POA: Diagnosis not present

## 2023-01-12 DIAGNOSIS — I700 Atherosclerosis of aorta: Secondary | ICD-10-CM | POA: Diagnosis not present

## 2023-01-12 DIAGNOSIS — N184 Chronic kidney disease, stage 4 (severe): Secondary | ICD-10-CM | POA: Diagnosis not present

## 2023-01-13 DIAGNOSIS — S72142D Displaced intertrochanteric fracture of left femur, subsequent encounter for closed fracture with routine healing: Secondary | ICD-10-CM | POA: Diagnosis not present

## 2023-01-13 DIAGNOSIS — N1832 Chronic kidney disease, stage 3b: Secondary | ICD-10-CM | POA: Diagnosis not present

## 2023-01-13 DIAGNOSIS — I129 Hypertensive chronic kidney disease with stage 1 through stage 4 chronic kidney disease, or unspecified chronic kidney disease: Secondary | ICD-10-CM | POA: Diagnosis not present

## 2023-01-13 DIAGNOSIS — I2510 Atherosclerotic heart disease of native coronary artery without angina pectoris: Secondary | ICD-10-CM | POA: Diagnosis not present

## 2023-01-13 DIAGNOSIS — D62 Acute posthemorrhagic anemia: Secondary | ICD-10-CM | POA: Diagnosis not present

## 2023-01-13 DIAGNOSIS — I959 Hypotension, unspecified: Secondary | ICD-10-CM | POA: Diagnosis not present

## 2023-01-15 DIAGNOSIS — N1832 Chronic kidney disease, stage 3b: Secondary | ICD-10-CM | POA: Diagnosis not present

## 2023-01-15 DIAGNOSIS — D62 Acute posthemorrhagic anemia: Secondary | ICD-10-CM | POA: Diagnosis not present

## 2023-01-15 DIAGNOSIS — I2510 Atherosclerotic heart disease of native coronary artery without angina pectoris: Secondary | ICD-10-CM | POA: Diagnosis not present

## 2023-01-15 DIAGNOSIS — S72142D Displaced intertrochanteric fracture of left femur, subsequent encounter for closed fracture with routine healing: Secondary | ICD-10-CM | POA: Diagnosis not present

## 2023-01-15 DIAGNOSIS — I129 Hypertensive chronic kidney disease with stage 1 through stage 4 chronic kidney disease, or unspecified chronic kidney disease: Secondary | ICD-10-CM | POA: Diagnosis not present

## 2023-01-15 DIAGNOSIS — I959 Hypotension, unspecified: Secondary | ICD-10-CM | POA: Diagnosis not present

## 2023-01-19 DIAGNOSIS — N1832 Chronic kidney disease, stage 3b: Secondary | ICD-10-CM | POA: Diagnosis not present

## 2023-01-19 DIAGNOSIS — I2510 Atherosclerotic heart disease of native coronary artery without angina pectoris: Secondary | ICD-10-CM | POA: Diagnosis not present

## 2023-01-19 DIAGNOSIS — S72142D Displaced intertrochanteric fracture of left femur, subsequent encounter for closed fracture with routine healing: Secondary | ICD-10-CM | POA: Diagnosis not present

## 2023-01-19 DIAGNOSIS — I959 Hypotension, unspecified: Secondary | ICD-10-CM | POA: Diagnosis not present

## 2023-01-19 DIAGNOSIS — D62 Acute posthemorrhagic anemia: Secondary | ICD-10-CM | POA: Diagnosis not present

## 2023-01-19 DIAGNOSIS — I129 Hypertensive chronic kidney disease with stage 1 through stage 4 chronic kidney disease, or unspecified chronic kidney disease: Secondary | ICD-10-CM | POA: Diagnosis not present

## 2023-01-20 ENCOUNTER — Ambulatory Visit (HOSPITAL_BASED_OUTPATIENT_CLINIC_OR_DEPARTMENT_OTHER): Payer: Medicare Other | Admitting: Orthopaedic Surgery

## 2023-01-29 ENCOUNTER — Ambulatory Visit (INDEPENDENT_AMBULATORY_CARE_PROVIDER_SITE_OTHER): Payer: Medicare Other | Admitting: Orthopaedic Surgery

## 2023-01-29 ENCOUNTER — Ambulatory Visit (INDEPENDENT_AMBULATORY_CARE_PROVIDER_SITE_OTHER): Payer: Medicare Other

## 2023-01-29 DIAGNOSIS — M170 Bilateral primary osteoarthritis of knee: Secondary | ICD-10-CM

## 2023-01-29 DIAGNOSIS — W19XXXD Unspecified fall, subsequent encounter: Secondary | ICD-10-CM

## 2023-01-29 DIAGNOSIS — M25552 Pain in left hip: Secondary | ICD-10-CM | POA: Diagnosis not present

## 2023-01-29 DIAGNOSIS — M1712 Unilateral primary osteoarthritis, left knee: Secondary | ICD-10-CM | POA: Diagnosis not present

## 2023-01-29 MED ORDER — TRIAMCINOLONE ACETONIDE 40 MG/ML IJ SUSP
80.0000 mg | INTRAMUSCULAR | Status: AC | PRN
Start: 2023-01-29 — End: 2023-01-29
  Administered 2023-01-29: 80 mg via INTRA_ARTICULAR

## 2023-01-29 MED ORDER — LIDOCAINE HCL 1 % IJ SOLN
4.00 mL | INTRAMUSCULAR | Status: AC | PRN
Start: 2023-01-29 — End: 2023-01-29
  Administered 2023-01-29: 4 mL

## 2023-01-29 NOTE — Progress Notes (Signed)
Post Operative Evaluation    Procedure/Date of Surgery: Status post left hip cephalomedullary nail 4/27  Interval History:   Presents today 9 weeks status post the above procedure.  She has been getting back to normal in terms of walking around her house.  That being said her left knee is still painful and bothering her ability to recover and ambulate.  Today she is predominantly experiencing pain in the left knee   PMH/PSH/Family History/Social History/Meds/Allergies:    Past Medical History:  Diagnosis Date   CKD (chronic kidney disease), stage III (HCC)    Family history of breast cancer    mother   Family history of colon cancer    father   Glaucoma    Osteopenia    SBO (small bowel obstruction) (HCC)    Past Surgical History:  Procedure Laterality Date   APPENDECTOMY     CESAREAN SECTION     x6   CORONARY STENT INTERVENTION N/A 06/08/2022   Procedure: CORONARY STENT INTERVENTION;  Surgeon: Lennette Bihari, MD;  [facility];  Service: Cardiovascular;  Laterality: N/A;   ESOPHAGOGASTRODUODENOSCOPY  07/07/2012   Procedure: ESOPHAGOGASTRODUODENOSCOPY (EGD);  Surgeon: Willis Modena, MD;  Location: [facility] ENDOSCOPY;  Service: Endoscopy;  Laterality: N/A;   GLAUCOMA SURGERY Bilateral 02/17/2022   LEFT COLECTOMY  04/30/2011   Sr Streck   LEFT HEART CATH AND CORONARY ANGIOGRAPHY N/A 06/08/2022   Procedure: LEFT HEART CATH AND CORONARY ANGIOGRAPHY;  Surgeon: Lennette Bihari, MD;  [facility];  Service: Cardiovascular;  Laterality: N/A;   tubal cyst  04/30/2011   L fallopian tube cyst incidentally found   Social History   Socioeconomic History   Marital status: Widowed    Spouse name: Not on file   Number of children: Not on file   Years of education: Not on file   Highest education level: Not on file  Occupational History   Not on file  Tobacco Use   Smoking status: Former    Packs/day: .5    Types:  Cigarettes   Smokeless tobacco: Not on file  Vaping Use   Vaping Use: Not on file  Substance and Sexual Activity   Alcohol use: No    Comment: rare   Drug use: No   Sexual activity: Not Currently  Other Topics Concern   Not on file  Social History Narrative   Not on file   Social Determinants of Health   Financial Resource Strain: Not on file  Food Insecurity: No Food Insecurity (06/09/2022)   Hunger Vital Sign    Worried About Running Out of Food in the Last Year: Never true    Ran Out of Food in the Last Year: Never true  Transportation Needs: No Transportation Needs (06/09/2022)   PRAPARE - Administrator, Civil Service (Medical): No    Lack of Transportation (Non-Medical): No  Physical Activity: Not on file  Stress: Not on file  Social Connections: Not on file   Family History  Problem Relation Age of Onset   Breast cancer Mother    Colon cancer Father    Diabetes Sister    Allergies  Allergen Reactions   Actonel [Risedronate] Nausea And Vomiting   Fosamax [Alendronate] Nausea And Vomiting   Miacalcin Other (See Comments)  Unknown reaction   Neomycin Other (See Comments)    Unknown reaction   Neosporin [Neomycin-Polymyxin-Gramicidin] Swelling   Neurontin [Gabapentin] Other (See Comments)    Confusion   Codeine Palpitations   Current Outpatient Medications  Medication Sig Dispense Refill   acetaminophen (TYLENOL) 500 MG tablet Take 1,000 mg by mouth every 6 (six) hours as needed.     amLODipine (NORVASC) 2.5 MG tablet Take 2.5 mg by mouth daily.     aspirin EC 81 MG tablet Take 1 tablet (81 mg total) by mouth daily. Swallow whole. 30 tablet 12   atorvastatin (LIPITOR) 40 MG tablet Take 1 tablet (40 mg total) by mouth daily. 30 tablet 11   brimonidine (ALPHAGAN) 0.2 % ophthalmic solution Place 1 drop into both eyes 3 (three) times daily.     dorzolamide-timolol (COSOPT) 22.3-6.8 MG/ML ophthalmic solution 1 drop 2 (two) times daily.        latanoprost (XALATAN) 0.005 % ophthalmic solution Place 1 drop into both eyes every evening.     methocarbamol (ROBAXIN) 500 MG tablet Take 1 tablet (500 mg total) by mouth 3 (three) times daily. 30 tablet 3   metoprolol tartrate (LOPRESSOR) 25 MG tablet Take 0.5 tablets (12.5 mg total) by mouth 2 (two) times daily. 60 tablet 2   nitroGLYCERIN (NITROSTAT) 0.4 MG SL tablet Place 1 tablet (0.4 mg total) under the tongue every 5 (five) minutes as needed for chest pain. 30 tablet 12   OPTIVE 0.5-0.9 % ophthalmic solution Place 1 drop into both eyes 4 (four) times daily as needed for dry eyes.     pantoprazole (PROTONIX) 40 MG tablet Take 1 tablet (40 mg total) by mouth daily. 30 tablet 0   RHOPRESSA 0.02 % SOLN Place 1 drop into both eyes every evening.     ticagrelor (BRILINTA) 90 MG TABS tablet Take 1 tablet (90 mg total) by mouth 2 (two) times daily. 60 tablet 2   Travoprost, BAK Free, (TRAVATAMN) 0.004 % SOLN ophthalmic solution Place 1 drop into both eyes at bedtime.      No current facility-administered medications for this visit.   No results found.  Review of Systems:   A ROS was performed including pertinent positives and negatives as documented in the HPI.   Musculoskeletal Exam:    There were no vitals taken for this visit.  Left hip wounds are well-appearing without erythema or drainage.  Able to actively flex at the left hip.  Sitting in a wheelchair.  30 degrees internal/external rotation of the hip without pain  Left knee with crepitus and pain from 0 to 90 degrees of flexion.  There is significant pain tricompartmentally.  Small effusion  Imaging:    3 views left hip: No evidence of hardware failure  I personally reviewed and interpreted the radiographs.   Assessment:   8 weeks status post left hip cephalomedullary nailing overall doing well.  Today her left knee osteoarthritis is bothering her more so than anything else.  Given this we have offered her an  ultrasound-guided injection of the left knee.  She like to proceed with this today.  Plan :    -Return to clinic in 8-week's    Procedure Note  Patient: Laurie [name]             Date of Birth: Mar 03, 1932           MRN: [id number]             Visit Date: 01/29/2023  Procedures:  Visit Diagnoses:  1. Fall, subsequent encounter     Large Joint Inj: L knee on 01/29/2023 11:39 AM Indications: pain Details: 22 G 1.5 in needle, ultrasound-guided anterior approach  Arthrogram: No  Medications: 4 mL lidocaine 1 %; 80 mg triamcinolone acetonide 40 MG/ML Outcome: tolerated well, no immediate complications Procedure, treatment alternatives, risks and benefits explained, specific risks discussed. Consent was given by the patient. Immediately prior to procedure a time out was called to verify the correct patient, procedure, equipment, support staff and site/side marked as required. Patient was prepped and draped in the usual sterile fashion.             I personally saw and evaluated the patient, and participated in the management and treatment plan.  Huel Cote, MD Attending Physician, Orthopedic Surgery  This document was dictated using Dragon voice recognition software. A reasonable attempt at proof reading has been made to minimize errors.

## 2023-02-12 DIAGNOSIS — H401133 Primary open-angle glaucoma, bilateral, severe stage: Secondary | ICD-10-CM | POA: Diagnosis not present

## 2023-03-30 ENCOUNTER — Ambulatory Visit: Payer: Medicare Other | Admitting: Cardiology

## 2023-03-31 ENCOUNTER — Ambulatory Visit (HOSPITAL_BASED_OUTPATIENT_CLINIC_OR_DEPARTMENT_OTHER): Payer: Medicare Other | Admitting: Orthopaedic Surgery

## 2023-03-31 ENCOUNTER — Ambulatory Visit (HOSPITAL_BASED_OUTPATIENT_CLINIC_OR_DEPARTMENT_OTHER): Payer: Medicare Other

## 2023-03-31 DIAGNOSIS — M1712 Unilateral primary osteoarthritis, left knee: Secondary | ICD-10-CM | POA: Diagnosis not present

## 2023-03-31 DIAGNOSIS — W19XXXD Unspecified fall, subsequent encounter: Secondary | ICD-10-CM

## 2023-03-31 DIAGNOSIS — M25552 Pain in left hip: Secondary | ICD-10-CM | POA: Diagnosis not present

## 2023-03-31 DIAGNOSIS — M25562 Pain in left knee: Secondary | ICD-10-CM | POA: Diagnosis not present

## 2023-03-31 NOTE — Progress Notes (Signed)
Post Operative Evaluation    Procedure/Date of Surgery: Status post left hip cephalomedullary nail 4/27  Interval History:   Presents today status post left hip cephalomedullary nailing.  She has been having a very hard time mobilizing out of her wheelchair.  She is having some residual weakness and left knee pain.  She get relief from a left knee injection at the last visit.   PMH/PSH/Family History/Social History/Meds/Allergies:    Past Medical History:  Diagnosis Date   CKD (chronic kidney disease), stage III (HCC)    Family history of breast cancer    mother   Family history of colon cancer    father   Glaucoma    Osteopenia    SBO (small bowel obstruction) (HCC)    Past Surgical History:  Procedure Laterality Date   APPENDECTOMY     CESAREAN SECTION     x6   CORONARY STENT INTERVENTION N/A 06/08/2022   Procedure: CORONARY STENT INTERVENTION;  Surgeon: Lennette Bihari, MD;  [facility];  Service: Cardiovascular;  Laterality: N/A;   ESOPHAGOGASTRODUODENOSCOPY  07/07/2012   Procedure: ESOPHAGOGASTRODUODENOSCOPY (EGD);  Surgeon: Willis Modena, MD;  Location: [facility] ENDOSCOPY;  Service: Endoscopy;  Laterality: N/A;   GLAUCOMA SURGERY Bilateral 02/17/2022   LEFT COLECTOMY  04/30/2011   Sr Streck   LEFT HEART CATH AND CORONARY ANGIOGRAPHY N/A 06/08/2022   Procedure: LEFT HEART CATH AND CORONARY ANGIOGRAPHY;  Surgeon: Lennette Bihari, MD;  Location: [facility];  Service: Cardiovascular;  Laterality: N/A;   tubal cyst  04/30/2011   L fallopian tube cyst incidentally found   Social History   Socioeconomic History   Marital status: Widowed    Spouse name: Not on file   Number of children: Not on file   Years of education: Not on file   Highest education level: Not on file  Occupational History   Not on file  Tobacco Use   Smoking status: Former    Current packs/day: 0.50    Types: Cigarettes   Smokeless  tobacco: Not on file  Vaping Use   Vaping status: Not on file  Substance and Sexual Activity   Alcohol use: No    Comment: rare   Drug use: No   Sexual activity: Not Currently  Other Topics Concern   Not on file  Social History Narrative   Not on file   Social Determinants of Health   Financial Resource Strain: Low Risk  (11/20/2022)   Received [facility], [facility]   Overall Financial Resource Strain (CARDIA)    Difficulty of Paying Living Expenses: Not hard at all  Food Insecurity: No Food Insecurity (11/20/2022)   Received [facility], [facility]   Hunger Vital Sign    Worried About Running Out of Food in the Last Year: Never true    Ran Out of Food in the Last Year: Never true  Transportation Needs: No Transportation Needs (11/20/2022)   Received from [facility], [facility]   PRAPARE - Transportation    Lack of Transportation (Medical): No    Lack of Transportation (Non-Medical): No  Physical Activity: Not on file  Stress: Not on file  Social Connections: Unknown (01/07/2022)   Received from [facility]  Social Network    Social Network: Not on file   Family History  Problem Relation Age of Onset   Breast cancer Mother    Colon cancer Father    Diabetes Sister    Allergies  Allergen Reactions   Actonel [Risedronate] Nausea And Vomiting   Fosamax [Alendronate] Nausea And Vomiting   Miacalcin Other (See Comments)    Unknown reaction   Neomycin Other (See Comments)    Unknown reaction   Neosporin [Neomycin-Polymyxin-Gramicidin] Swelling   Neurontin [Gabapentin] Other (See Comments)    Confusion   Codeine Palpitations   Current Outpatient Medications  Medication Sig Dispense Refill   acetaminophen (TYLENOL) 500 MG tablet Take 1,000 mg by mouth every 6 (six) hours as needed.     amLODipine (NORVASC) 2.5 MG tablet Take 2.5 mg by mouth daily.      aspirin EC 81 MG tablet Take 1 tablet (81 mg total) by mouth daily. Swallow whole. 30 tablet 12   atorvastatin (LIPITOR) 40 MG tablet Take 1 tablet (40 mg total) by mouth daily. 30 tablet 11   brimonidine (ALPHAGAN) 0.2 % ophthalmic solution Place 1 drop into both eyes 3 (three) times daily.     dorzolamide-timolol (COSOPT) 22.3-6.8 MG/ML ophthalmic solution 1 drop 2 (two) times daily.       latanoprost (XALATAN) 0.005 % ophthalmic solution Place 1 drop into both eyes every evening.     methocarbamol (ROBAXIN) 500 MG tablet Take 1 tablet (500 mg total) by mouth 3 (three) times daily. 30 tablet 3   metoprolol tartrate (LOPRESSOR) 25 MG tablet Take 0.5 tablets (12.5 mg total) by mouth 2 (two) times daily. 60 tablet 2   nitroGLYCERIN (NITROSTAT) 0.4 MG SL tablet Place 1 tablet (0.4 mg total) under the tongue every 5 (five) minutes as needed for chest pain. 30 tablet 12   OPTIVE 0.5-0.9 % ophthalmic solution Place 1 drop into both eyes 4 (four) times daily as needed for dry eyes.     pantoprazole (PROTONIX) 40 MG tablet Take 1 tablet (40 mg total) by mouth daily. 30 tablet 0   RHOPRESSA 0.02 % SOLN Place 1 drop into both eyes every evening.     ticagrelor (BRILINTA) 90 MG TABS tablet Take 1 tablet (90 mg total) by mouth 2 (two) times daily. 60 tablet 2   Travoprost, BAK Free, (TRAVATAMN) 0.004 % SOLN ophthalmic solution Place 1 drop into both eyes at bedtime.      No current facility-administered medications for this visit.   No results found.  Review of Systems:   A ROS was performed including pertinent positives and negatives as documented in the HPI.   Musculoskeletal Exam:    There were no vitals taken for this visit.  Left hip wounds are well-appearing without erythema or drainage.  Able to actively flex at the left hip.  Sitting in a wheelchair.  30 degrees internal/external rotation of the hip without pain  Left knee with crepitus and pain from 0 to 90 degrees of flexion.  There is  significant pain tricompartmentally.  Small effusion  Imaging:    3 views left hip: No evidence of hardware failure  I personally reviewed and interpreted the radiographs.   Assessment:   status post left hip cephalomedullary nailing overall doing well.  At this time her fracture is healed.  I being said she does have some muscle atrophy and weakness in the setting of inability to mobilize.  At this time I would like her to participate in  particularly aquatic therapy to strengthen up her core and work on gait training.  I will plan to see her back in 4 months for reassessment  Plan :    -Return to clinic in 16 weeks     I personally saw and evaluated the patient, and participated in the management and treatment plan.  Huel Cote, MD Attending Physician, Orthopedic Surgery  This document was dictated using Dragon voice recognition software. A reasonable attempt at proof reading has been made to minimize errors.

## 2023-04-01 ENCOUNTER — Telehealth: Payer: Self-pay | Admitting: Adult Health

## 2023-04-01 NOTE — Progress Notes (Signed)
[facility] [address] [place]. [place] [address] [phone]       STROKE FOLLOW UP NOTE  [name] Date of Birth:  07-10-1932 Medical Record Number:  [id number]   Reason for visit: stroke follow up    SUBJECTIVE:   CHIEF COMPLAINT:  Chief Complaint  Patient presents with   Follow-up    Rm 3, here with daughter Laurie [name] Pt is here for 6 month stroke follow up. Pt states she is doing well overall. No new concerns.     HPI:   Update 04/08/2023 JM: Patient previously seen back in January for stroke follow-up and released at that time as stable from stroke standpoint.  Family requested follow-up visit today for routine stroke follow up and memory concerns, she is accompanied by her daughter.  Reports continued occasional word finding difficulty, present since prior stroke, daughter notes son has been concerned about occasional mood changes, more so in the evening. Daughter and patient deny memory loss. Mentions one instance where patient was asking son to turn off a light and when he wasn't able to immediately do this, she became very upset and was yelling, she remembers this occurring, no confusion present, reports being frustrated as she was unable to do this for herself. Does have chronic right eye vision loss, has had some left eye vision decline, being followed by [facility] Dr. Elige Ko for glaucoma and macular degeneration - looking at either laser or surgical correction. Mentions x2 episode of left hand numbness usually after hand use such as holding news paper, no numbness in face or leg. Did have a fall back in April resulting in left hip fracture requiring cephalomedullary nailing, ambulation limited since, ambulates short distance with rollator walker or cane otherwise transfers via w/c, denies any additional falls, followed by ortho, waiting to start aquatic therapy. Compliant on aspirin and atorvastatin.   Blood pressure slightly  elevated today but has not taken BP meds yet today, usually takes around lunch time. Occasionally monitors at home and has been stable. Routinely follows with PCP for stroke risk factor management.    History provided for reference purposes only Update 08/20/2022 JM: Patient returns for 59-month stroke follow-up accompanied by her son.  Reports residual occasional word finding difficulty or delayed finding and occasional slurred speech but has improved since prior visit.  Denies new stroke/TIA symptoms.  Remains on aspirin and atorvastatin Blood pressure well-controlled Routinely follows with PCP.  Initial visit 02/17/2022 JM: Patient is being seen for initial hospital follow-up accompanied by her son.  Has been doing well since discharge.  Reports residual occasional word finding difficulty. Denies any weakness. Will have some gait difficulty if sitting too long.  Complains of left leg pain which she reports has been present since her hospitalization. Also notes increased fatigue since stroke.  Use of cane with long distance or uneven ground, no recent falls. Was seen by [facility] PT/SLP and was released. Plans on getting set up with [facility] OT soon. Lives with her son and daughter lives near by. Able to maintain ADLs independently. Denies new stroke/TIA symptoms.   Is wearing a neck brace for a "pinched nerve" in her neck, reports previously seen by Ortho and was told due to arthritis and "nothing could be done".  Neck brace helps with the pain as it helps to limit/protect cervical ROM.  This is a chronic issue and denies any worsening.  Completed 3 weeks DAPT, remains on aspirin alone as well as atorvastatin,  denies side effects. Blood pressures today 135/65 - occasionally monitored at home which has been stable  Has since seen PCP since discharge, has f/u next month  No further concerns at this time  Stroke admission 12/28/2021 [name] is a 89 y.o. female with history of CKD, osteoporosis,  HLD, squamous cell carcinoma, glaucoma, colon cancer and HTN who presented on 12/28/2021 with acute onset slurred speech.  Personally reviewed hospitalization pertinent progress notes, lab work and imaging.  Evaluated by Dr. Pearlean Brownie for small acute infarct in left corona radiata likely secondary to small vessel disease.  MRA head/neck negative LVO, no carotid stenosis or intracranial vessel stenosis.  EF 60 to 65%.  LDL 130.  A1c 5.5.  Recommended DAPT for 3 weeks and aspirin alone as well as initiate atorvastatin 40 mg daily.  No prior stroke history.  Therapy eval's recommended home health PT/OT     PERTINENT IMAGING  Per hospitalizations 02/17/2022 CT head No acute abnormality.  MRI  small acute infarct in left corona radiata, chronic microvascular ischemic changes MRA head and neck no LVO, no carotid stenosis, no intracranial vessel stenosis 2D Echo EF 60-65%, no atrial level shunt LDL 130 HgbA1c 5.5    ROS:   14 system review of systems performed and negative with exception of those listed in HPI  PMH:  Past Medical History:  Diagnosis Date   CKD (chronic kidney disease), stage III (HCC)    Family history of breast cancer    mother   Family history of colon cancer    father   Glaucoma    Osteopenia    SBO (small bowel obstruction) (HCC)     PSH:  Past Surgical History:  Procedure Laterality Date   APPENDECTOMY     CESAREAN SECTION     x6   CORONARY STENT INTERVENTION N/A 06/08/2022   Procedure: CORONARY STENT INTERVENTION;  Surgeon: Lennette Bihari, MD;  Location: [facility] INVASIVE CV LAB;  Service: Cardiovascular;  Laterality: N/A;   ESOPHAGOGASTRODUODENOSCOPY  07/07/2012   Procedure: ESOPHAGOGASTRODUODENOSCOPY (EGD);  Surgeon: Willis Modena, MD;  Location: [facility] ENDOSCOPY;  Service: Endoscopy;  Laterality: N/A;   GLAUCOMA SURGERY Bilateral 02/17/2022   HIP SURGERY     2 rods were put on left hip   LEFT COLECTOMY  04/30/2011   Sr Streck   LEFT HEART CATH AND CORONARY  ANGIOGRAPHY N/A 06/08/2022   Procedure: LEFT HEART CATH AND CORONARY ANGIOGRAPHY;  Surgeon: Lennette Bihari, MD;  Location: [facility] INVASIVE CV LAB;  Service: Cardiovascular;  Laterality: N/A;   tubal cyst  04/30/2011   L fallopian tube cyst incidentally found    Social History:  Social History   Socioeconomic History   Marital status: Widowed    Spouse name: Not on file   Number of children: Not on file   Years of education: Not on file   Highest education level: Not on file  Occupational History   Not on file  Tobacco Use   Smoking status: Former    Current packs/day: 0.50    Types: Cigarettes   Smokeless tobacco: Not on file  Vaping Use   Vaping status: Not on file  Substance and Sexual Activity   Alcohol use: No    Comment: rare   Drug use: No   Sexual activity: Not Currently  Other Topics Concern   Not on file  Social History Narrative   Not on file   Social Determinants of Health   Financial Resource Strain: Low Risk  (  11/20/2022)   Received from [facility], [facility]   Overall Financial Resource Strain (CARDIA)    Difficulty of Paying Living Expenses: Not hard at all  Food Insecurity: No Food Insecurity (11/20/2022)   Received from [facility], [facility]   Hunger Vital Sign    Worried About Running Out of Food in the Last Year: Never true    Ran Out of Food in the Last Year: Never true  Transportation Needs: No Transportation Needs (11/20/2022)   Received from [facility], [facility]   PRAPARE - Transportation    Lack of Transportation (Medical): No    Lack of Transportation (Non-Medical): No  Physical Activity: Not on file  Stress: Not on file  Social Connections: Unknown (01/07/2022)   Received from [facility]   Social Network    Social Network: Not on file  Intimate Partner Violence: Not At Risk (11/20/2022)   Received from [facility], [facility]   Humiliation, Afraid, Rape, and Kick questionnaire    Fear of Current or Ex-Partner: No    Emotionally Abused: No    Physically Abused: No    Sexually Abused: No    Family History:  Family History  Problem Relation Age of Onset   Breast cancer Mother    Colon cancer Father    Diabetes Sister     Medications:   Current Outpatient Medications on File Prior to Visit  Medication Sig Dispense Refill   acetaminophen (TYLENOL) 500 MG tablet Take 1,000 mg by mouth every 6 (six) hours as needed.     amLODipine (NORVASC) 2.5 MG tablet Take 2.5 mg by mouth daily.     aspirin EC 81 MG tablet Take 1 tablet (81 mg total) by mouth daily. Swallow whole. 30 tablet 12   atorvastatin (LIPITOR) 40 MG tablet Take 1 tablet (40 mg total) by mouth daily. 30 tablet 11   brimonidine (ALPHAGAN) 0.2 % ophthalmic solution Place 1 drop into both eyes 3 (three) times daily.     dorzolamide-timolol (COSOPT) 22.3-6.8 MG/ML ophthalmic solution 1 drop 2 (two) times daily.       metoprolol tartrate (LOPRESSOR) 25 MG tablet Take 0.5 tablets (12.5 mg total) by mouth 2 (two) times daily. 60 tablet 2   RHOPRESSA 0.02 % SOLN Place 1 drop into both eyes every evening.     ticagrelor (BRILINTA) 90 MG TABS tablet Take 1 tablet (90 mg total) by mouth 2 (two) times daily. 60 tablet 2   latanoprost (XALATAN) 0.005 % ophthalmic solution Place 1 drop into both eyes every evening. (Patient not taking: Reported on 04/05/2023)     methocarbamol (ROBAXIN) 500 MG tablet Take 1 tablet (500 mg total) by mouth 3 (three) times daily. (Patient not taking: Reported on 04/05/2023) 30 tablet 3   nitroGLYCERIN (NITROSTAT) 0.4 MG SL tablet Place 1 tablet (0.4 mg total) under the tongue every 5 (five) minutes as needed for chest pain. (Patient not taking: Reported on 04/05/2023) 30 tablet 12   OPTIVE 0.5-0.9 % ophthalmic solution Place 1 drop into both eyes 4 (four) times daily as needed for dry eyes. (Patient not  taking: Reported on 04/05/2023)     pantoprazole (PROTONIX) 40 MG tablet Take 1 tablet (40 mg total) by mouth daily. (Patient not taking: Reported on 04/05/2023) 30 tablet 0   No current facility-administered medications on file prior to visit.    Allergies:   Allergies  Allergen Reactions   Actonel [Risedronate] Nausea And Vomiting   Fosamax [Alendronate] Nausea And Vomiting   Miacalcin Other (See Comments)    Unknown reaction   Neomycin Other (See Comments)    Unknown reaction   Neosporin [Neomycin-Polymyxin-Gramicidin] Swelling   Neurontin [Gabapentin] Other (See Comments)    Confusion   Codeine Palpitations      OBJECTIVE:  Physical Exam  Vitals:   04/05/23 0949  BP: (!) 149/65  Pulse: 62  Weight: 125 lb (56.7 kg)  Height: 4\' 11"  (1.499 m)   Body mass index is 25.25 kg/m. No results found.   General: well developed, well nourished, very pleasant elderly Caucasian female, seated, in no evident distress seated in wheelchair  Head: head normocephalic and atraumatic.   Neck: supple with no carotid or supraclavicular bruits Cardiovascular: regular rate and rhythm, no murmurs Musculoskeletal: no deformity Skin:  no rash/petichiae Vascular:  Normal pulses all extremities   Neurologic Exam Mental Status: Awake and fully alert.  Occasional word finding difficulty. Some difficulty following commands. Oriented to place and time. Recent memory mildly impaired and remote memory intact. Attention span, concentration and fund of knowledge appropriate during visit. Mood and affect appropriate.  Cranial Nerves: Pupils equal, briskly reactive to light. Extraocular movements full without nystagmus. OD vision loss, OS right peripheral impairment. Hearing intact. Facial sensation intact. Face, tongue, palate moves normally and symmetrically.  Motor: Normal bulk and tone. Normal strength in all tested extremity muscles Sensory.: intact to touch , pinprick , position and vibratory  sensation.  Coordination: Rapid alternating movements normal in all extremities. Finger-to-nose and heel-to-shin performed accurately bilaterally. Gait and Station: deferred Reflexes: 1+ and symmetric. Toes downgoing.        ASSESSMENT: DARI MARTORELLA is a 89 y.o. year old female with left corona radiata infarct on 12/28/2021 likely secondary to small vessel disease with residual speech deficit. Vascular risk factors include HTN, HLD, advanced age and former tobacco use. Presents today with cognitive concerns by family and some occasional mood changes, vision changes noted on exam today, intermittent left hand numbness and concern of difficulty following commands.      PLAN:  New neuro deficits: Recommend repeating MRI brain wo contrast for further evaluation and to rule out new stroke If MRI without new/concerning findings,  cognition and mood changes likely multifactorial with hx of prior stroke, age, polypharmacy and impaired gait/mobility due to prior hip fracture Vision impairment likely related to glaucoma and macular degeneration - actively followed by ophthalmology  Left hand numbness likely from underlying nerve compression, symptoms intermittent, resolve after repositioning, advised to avoid positioning/movements that worsen symptoms, can try use of braces to help. If symptoms worsen or become persist, can proceed with EMG/NCV  Left CR stroke:  Residual deficit: occasional aphasia  Continue aspirin 81 mg daily  and atorvastatin 40 mg daily for secondary stroke prevention.   Discussed secondary stroke prevention measures and importance of close PCP follow up for aggressive stroke risk factor management including BP goal<130/90, HLD with LDL goal<70 and DM with A1c.<7 .  I have gone over the pathophysiology of stroke, warning signs and symptoms, risk factors and their management in some detail with instructions to go to the closest emergency room for symptoms of  concern.     Recommend follow up as needed at this time. Will determine if follow up is needed after completion of MRI brain, if normal, will continue to follow with PCP for stroke monitoring/management      CC:  PCP: Daisy Floro, MD    I spent 40 minutes of face-to-face and non-face-to-face time with patient and daughter.  This included previsit chart review, lab review, study review, electronic health record documentation, patient and daughter education and discussion regarding above diagnoses and treatment plan and answered all other questions to patient and  daughters satisfaction  Ihor Austin, [facility]  [facility] [address] [place], [place] [address]  Phone [phone] Fax [phone] Note: This document was prepared with digital dictation and possible smart phrase technology. Any transcriptional errors that result from this process are unintentional.

## 2023-04-01 NOTE — Telephone Encounter (Signed)
Patient previously seen 07/2022 for stroke follow up and doing well at that time, was advised to follow up as needed. When patient calls back, please determine reason for scheduling visit. If new issue, will need evaluation by MD after PCP sends in a new referral. Thank you.

## 2023-04-01 NOTE — Telephone Encounter (Signed)
After checking DPR a detailed message was left asking for a call back to discuss in more detail the reason for the appointment on Sept 9th, office # left on vm, this is [address]

## 2023-04-02 DIAGNOSIS — H401133 Primary open-angle glaucoma, bilateral, severe stage: Secondary | ICD-10-CM | POA: Diagnosis not present

## 2023-04-02 NOTE — Telephone Encounter (Signed)
Another detailed message was left, asking for a call to office (phone staff hours were stated on vm) asking for pt to call with more of an explanation as to why pt is needing to be seen on 9-9. This is FYI to POD 3 and NP

## 2023-04-02 NOTE — Telephone Encounter (Signed)
Returned call was made. Reason for appt is she was to come for a follow up regarding her stroke and they never were called to schedule. He states that also ever since the stroke she has been having a lot of confusion.

## 2023-04-05 ENCOUNTER — Encounter: Payer: Self-pay | Admitting: Adult Health

## 2023-04-05 ENCOUNTER — Telehealth: Payer: Self-pay | Admitting: Adult Health

## 2023-04-05 ENCOUNTER — Ambulatory Visit: Payer: Medicare Other | Admitting: Adult Health

## 2023-04-05 VITALS — BP 149/65 | HR 62 | Ht 59.0 in | Wt 125.0 lb

## 2023-04-05 DIAGNOSIS — R202 Paresthesia of skin: Secondary | ICD-10-CM | POA: Diagnosis not present

## 2023-04-05 DIAGNOSIS — R200 Anesthesia of skin: Secondary | ICD-10-CM

## 2023-04-05 DIAGNOSIS — H5462 Unqualified visual loss, left eye, normal vision right eye: Secondary | ICD-10-CM | POA: Diagnosis not present

## 2023-04-05 DIAGNOSIS — R4189 Other symptoms and signs involving cognitive functions and awareness: Secondary | ICD-10-CM

## 2023-04-05 DIAGNOSIS — R4689 Other symptoms and signs involving appearance and behavior: Secondary | ICD-10-CM

## 2023-04-05 DIAGNOSIS — Z8673 Personal history of transient ischemic attack (TIA), and cerebral infarction without residual deficits: Secondary | ICD-10-CM | POA: Diagnosis not present

## 2023-04-05 NOTE — Patient Instructions (Addendum)
Suspect left hand symptoms more due to nerve compression - recommend avoiding movements that cause symptoms and use of wrist braces (such as carpel tunnel brace) - if symptoms should worsen, please let me know know  We will obtain an MRI brain due to vision concerns seen on today's exam and some cognitive concerns - you will be called to schedule and will be notified via MyChart based on results   Continue aspirin 81 mg daily  and atorvastatin for secondary stroke prevention prescribed by PCP  Continue to follow up with PCP regarding blood pressure and cholesterol management  Maintain strict control of hypertension with blood pressure goal below 130/90 and cholesterol with LDL cholesterol (bad cholesterol) goal below 70 mg/dL.   Signs of a Stroke? Follow the BEFAST method:  Balance Watch for a sudden loss of balance, trouble with coordination or vertigo Eyes Is there a sudden loss of vision in one or both eyes? Or double vision?  Face: Ask the person to smile. Does one side of the face droop or is it numb?  Arms: Ask the person to raise both arms. Does one arm drift downward? Is there weakness or numbness of a leg? Speech: Ask the person to repeat a simple phrase. Does the speech sound slurred/strange? Is the person confused ? Time: If you observe any of these signs, call 911.     As you are stable from a stroke/neurological standpoint, you can return back to your PCP for routine follow up and continued management of stroke risk factors. No need for routine follow up in this office.        Thank you for coming to see [place] at [facility]. I hope we have been able to provide you high quality care today.  You may receive a patient satisfaction survey over the next few weeks. We would appreciate your feedback and comments so that we may continue to improve ourselves and the health of our patients.

## 2023-04-05 NOTE — Telephone Encounter (Signed)
[employer] medicare NPR sent to GI [phone] 

## 2023-04-09 ENCOUNTER — Ambulatory Visit: Payer: Medicare Other | Admitting: Physical Therapy

## 2023-04-13 ENCOUNTER — Ambulatory Visit: Payer: Medicare Other | Attending: Cardiology | Admitting: Nurse Practitioner

## 2023-04-13 ENCOUNTER — Encounter: Payer: Self-pay | Admitting: Nurse Practitioner

## 2023-04-13 VITALS — BP 122/68 | HR 62 | Ht 59.0 in | Wt 129.8 lb

## 2023-04-13 DIAGNOSIS — R600 Localized edema: Secondary | ICD-10-CM

## 2023-04-13 DIAGNOSIS — R9431 Abnormal electrocardiogram [ECG] [EKG]: Secondary | ICD-10-CM | POA: Diagnosis not present

## 2023-04-13 DIAGNOSIS — E785 Hyperlipidemia, unspecified: Secondary | ICD-10-CM | POA: Diagnosis not present

## 2023-04-13 DIAGNOSIS — N1832 Chronic kidney disease, stage 3b: Secondary | ICD-10-CM

## 2023-04-13 DIAGNOSIS — Z8673 Personal history of transient ischemic attack (TIA), and cerebral infarction without residual deficits: Secondary | ICD-10-CM | POA: Diagnosis not present

## 2023-04-13 DIAGNOSIS — I2510 Atherosclerotic heart disease of native coronary artery without angina pectoris: Secondary | ICD-10-CM

## 2023-04-13 DIAGNOSIS — I10 Essential (primary) hypertension: Secondary | ICD-10-CM

## 2023-04-13 NOTE — Patient Instructions (Signed)
Medication Instructions:  Your physician recommends that you continue on your current medications as directed. Please refer to the Current Medication list given to you today.  *If you need a refill on your cardiac medications before your next appointment, please call your pharmacy*   Lab Work: NONE ordered at this time of appointment    Testing/Procedures: NONE ordered at this time of appointment     Follow-Up: At [facility], you and your health needs are our priority.  As part of our continuing mission to provide you with exceptional heart care, we have created designated Provider Care Teams.  These Care Teams include your primary Cardiologist (physician) and Advanced Practice Providers (APPs -  Physician Assistants and Nurse Practitioners) who all work together to provide you with the care you need, when you need it.  We recommend signing up for the patient portal called "MyChart".  Sign up information is provided on this After Visit Summary.  MyChart is used to connect with patients for Virtual Visits (Telemedicine).  Patients are able to view lab/test results, encounter notes, upcoming appointments, etc.  Non-urgent messages can be sent to your provider as well.   To learn more about what you can do with MyChart, go to [email or web address].    Your next appointment:   4-6 month(s)  Provider:   Olga Millers, MD

## 2023-04-13 NOTE — Progress Notes (Signed)
Office Visit    Patient Name: Laurie [name] Date of Encounter: 04/13/2023  Primary Care Provider:  Daisy Floro, MD Primary Cardiologist:  Olga Millers, MD  Chief Complaint    89 year old female with a history of CAD s/p NSTEMI, DES-RCA in 05/2022, hypertension, hyperlipidemia, CVA, CKD stage IIIb, hiatal hernia and former tobacco use who presents for follow-up related to CAD.   Past Medical History    Past Medical History:  Diagnosis Date   CKD (chronic kidney disease), stage III (HCC)    Family history of breast cancer    mother   Family history of colon cancer    father   Glaucoma    Osteopenia    SBO (small bowel obstruction) (HCC)    Past Surgical History:  Procedure Laterality Date   APPENDECTOMY     CESAREAN SECTION     x6   CORONARY STENT INTERVENTION N/A 06/08/2022   Procedure: CORONARY STENT INTERVENTION;  Surgeon: Lennette Bihari, MD;  Location: [facility];  Service: Cardiovascular;  Laterality: N/A;   ESOPHAGOGASTRODUODENOSCOPY  07/07/2012   Procedure: ESOPHAGOGASTRODUODENOSCOPY (EGD);  Surgeon: Willis Modena, MD;  Location: [facility] ENDOSCOPY;  Service: Endoscopy;  Laterality: N/A;   GLAUCOMA SURGERY Bilateral 02/17/2022   HIP SURGERY     2 rods were put on left hip   LEFT COLECTOMY  04/30/2011   Sr Streck   LEFT HEART CATH AND CORONARY ANGIOGRAPHY N/A 06/08/2022   Procedure: LEFT HEART CATH AND CORONARY ANGIOGRAPHY;  Surgeon: Lennette Bihari, MD;  Location: [facility];  Service: Cardiovascular;  Laterality: N/A;   tubal cyst  04/30/2011   L fallopian tube cyst incidentally found    Allergies  Allergies  Allergen Reactions   Actonel [Risedronate] Nausea And Vomiting   Fosamax [Alendronate] Nausea And Vomiting   Miacalcin Other (See Comments)    Unknown reaction   Neomycin Other (See Comments)    Unknown reaction   Neosporin [Neomycin-Polymyxin-Gramicidin] Swelling   Neurontin [Gabapentin] Other (See Comments)     Confusion   Codeine Palpitations     Labs/Other Studies Reviewed    The following studies were reviewed today:  Echo 05/2022: IMPRESSIONS    1. Left ventricular ejection fraction, by estimation, is 65 to 70%. The  left ventricle has normal function. The left ventricle has no regional  wall motion abnormalities. There is mild left ventricular hypertrophy.  Left ventricular diastolic parameters  are consistent with Grade I diastolic dysfunction (impaired relaxation).   2. Right ventricular systolic function is normal. The right ventricular  size is normal.   3. The mitral valve is abnormal. No evidence of mitral valve  regurgitation. No evidence of mitral stenosis.   4. The aortic valve is abnormal. Aortic valve regurgitation is not  visualized. No aortic stenosis is present.   5. The inferior vena cava is normal in size with greater than 50%  respiratory variability, suggesting right atrial pressure of 3 mmHg.   Comparison(s): No significant change from prior study.   LHC 05/2022:    Mid RCA to Dist RCA lesion is 40% stenosed.   Dist RCA lesion is 99% stenosed.   Prox RCA-1 lesion is 50% stenosed.   Prox RCA-2 lesion is 50% stenosed.   Prox LAD lesion is 20% stenosed.   A drug-eluting stent was successfully placed.   Post intervention, there is a 0% residual stenosis.   Post intervention, there is a 0% residual stenosis.   NSTEMI secondary to subtotal  99% stenosis of the RCA after the acute margin proximal to the PDA takeoff in a dominant RCA vessel.  There are segmental 50% proximal stenosis on proximal bend of the RCA vessel.   Mildly calcified proximal LAD with 20% narrowing.   Normal but tortuous left circumflex coronary artery   LVEDP 7 mmHg   Difficult but successful percutaneous coronary convention utilizing a telescope guide extension catheter with ultimate PCI and DES stenting insertion of a 3.5 x 22 mm Onyx frontier stent postdilated to 3.75 mm with the distal  stenoses being reduced to 0% and brisk TIMI-3 flow.   RECOMMENDATION: DAPT for minimum of 12 months.  Initial medical therapy for concomitant CAD.  Aggressive lipid-lowering therapy with target LDL less than 70. Recent Labs: 06/05/2022: ALT 11; B Natriuretic Peptide 95.7; Magnesium 2.0 06/09/2022: BUN 20; Creatinine, Ser 1.60; Hemoglobin 12.2; Platelets 223; Potassium 4.0; Sodium 140  Recent Lipid Panel    Component Value Date/Time   CHOL 225 (H) 12/29/2021 1455   TRIG 114 12/29/2021 1455   HDL 72 12/29/2021 1455   CHOLHDL 3.1 12/29/2021 1455   VLDL 23 12/29/2021 1455   LDLCALC 130 (H) 12/29/2021 1455    History of Present Illness    89 year old female with the above past medical history including CAD s/p NSTEMI, DES-RCA in 05/2022, hypertension, hyperlipidemia, CVA, CKD stage IIIb, hiatal hernia, and former tobacco use.    She presented to the ED on 06/05/2022 in the setting of NSTEMI.  She had sudden onset nausea and generalized weakness followed by bilateral upper arm pain.  CTA was negative for PE or dissection. Troponin peaked at 877. Cardiology was consulted. Echocardiogram showed EF 65 to 70%, normal LV function, no RWMA, mild LVH, G1 DD, normal RV systolic function, mild to moderate MAC.  She underwent cardiac catheterization on 06/08/2022 which revealed 99% distal RCA occlusion s/p DES, otherwise nonobstructive CAD. She was started on aspirin, Brilinta, statin, and metoprolol.  She was last seen in the office on 09/24/2022 and was stable from a cardiac standpoint. She denied symptoms concerning for angina.    She presents today for follow-up accompanied by her grandson.  Since her last visit she has been stable from a cardiac standpoint. She notes that she broke her L hip in April 2024 and had 2 rods placed. She has been ambulating with a cane ever since.  She notes that her primary care doctor started her on Lasix 40 mg daily for bilateral lower extremity edema.  She has not been  taking this consistently but when she does take it it does help her swelling.  She denies any chest pain, dyspnea, PND, orthopnea, weight gain.  Overall, she reports feeling well.  Home Medications    Current Outpatient Medications  Medication Sig Dispense Refill   acetaminophen (TYLENOL) 500 MG tablet Take 1,000 mg by mouth every 6 (six) hours as needed.     amLODipine (NORVASC) 2.5 MG tablet Take 2.5 mg by mouth daily.     aspirin EC 81 MG tablet Take 1 tablet (81 mg total) by mouth daily. Swallow whole. 30 tablet 12   atorvastatin (LIPITOR) 40 MG tablet Take 1 tablet (40 mg total) by mouth daily. 30 tablet 11   brimonidine (ALPHAGAN) 0.2 % ophthalmic solution Place 1 drop into both eyes 3 (three) times daily.     dorzolamide-timolol (COSOPT) 22.3-6.8 MG/ML ophthalmic solution 1 drop 2 (two) times daily.       metoprolol tartrate (LOPRESSOR) 25 MG tablet Take  0.5 tablets (12.5 mg total) by mouth 2 (two) times daily. 60 tablet 2   RHOPRESSA 0.02 % SOLN Place 1 drop into both eyes every evening.     ticagrelor (BRILINTA) 90 MG TABS tablet Take 1 tablet (90 mg total) by mouth 2 (two) times daily. 60 tablet 2   latanoprost (XALATAN) 0.005 % ophthalmic solution Place 1 drop into both eyes every evening. (Patient not taking: Reported on 04/05/2023)     methocarbamol (ROBAXIN) 500 MG tablet Take 1 tablet (500 mg total) by mouth 3 (three) times daily. (Patient not taking: Reported on 04/05/2023) 30 tablet 3   nitroGLYCERIN (NITROSTAT) 0.4 MG SL tablet Place 1 tablet (0.4 mg total) under the tongue every 5 (five) minutes as needed for chest pain. (Patient not taking: Reported on 04/05/2023) 30 tablet 12   OPTIVE 0.5-0.9 % ophthalmic solution Place 1 drop into both eyes 4 (four) times daily as needed for dry eyes. (Patient not taking: Reported on 04/05/2023)     pantoprazole (PROTONIX) 40 MG tablet Take 1 tablet (40 mg total) by mouth daily. (Patient not taking: Reported on 04/05/2023) 30 tablet 0   No current  facility-administered medications for this visit.     Review of Systems    She denies chest pain, palpitations, dyspnea, pnd, orthopnea, n, v, dizziness, syncope, weight gain, or early satiety. All other systems reviewed and are otherwise negative except as noted above.   Physical Exam    VS:  BP 122/68   Pulse 62   Ht 4\' 11"  (1.499 m)   Wt 129 lb 12.8 oz (58.9 kg)   SpO2 98%   BMI 26.22 kg/m  GEN: Well nourished, well developed, in no acute distress. HEENT: normal. Neck: Supple, no JVD, carotid bruits, or masses. Cardiac: RRR, no murmurs, rubs, or gallops. No clubbing, cyanosis, non pitting bilateral lower extremity edema.  Radials/DP/PT 2+ and equal bilaterally.  Respiratory:  Respirations regular and unlabored, clear to auscultation bilaterally. GI: Soft, nontender, nondistended, BS + x 4. MS: no deformity or atrophy. Skin: warm and dry, no rash. Neuro:  Strength and sensation are intact. Psych: Normal affect.  Accessory Clinical Findings    ECG personally reviewed by me today - EKG Interpretation Date/Time:  Tuesday April 13 2023 11:36:06 EDT Ventricular Rate:  62 PR Interval:  154 QRS Duration:  62 QT Interval:  426 QTC Calculation: 432 R Axis:   -69  Text Interpretation: Normal sinus rhythm Left anterior fascicular block Confirmed by Bernadene Person ([id number]) on 04/13/2023 12:04:01 PM   Lab Results  Component Value Date   WBC 10.4 06/09/2022   HGB 12.2 06/09/2022   HCT 36.0 06/09/2022   MCV 94.5 06/09/2022   PLT 223 06/09/2022   Lab Results  Component Value Date   CREATININE 1.60 (H) 06/09/2022   BUN 20 06/09/2022   NA 140 06/09/2022   K 4.0 06/09/2022   CL 113 (H) 06/09/2022   CO2 21 (L) 06/09/2022   Lab Results  Component Value Date   ALT 11 06/05/2022   AST 17 06/05/2022   ALKPHOS 60 06/05/2022   BILITOT 0.5 06/05/2022   Lab Results  Component Value Date   CHOL 225 (H) 12/29/2021   HDL 72 12/29/2021   LDLCALC 130 (H) 12/29/2021   TRIG 114  12/29/2021   CHOLHDL 3.1 12/29/2021    Lab Results  Component Value Date   HGBA1C 5.5 12/29/2021    Assessment & Plan   1. CAD: S/p NSTEMI, DES-RCA in 05/2022.  Stable without anginal symptoms.  Continue aspirin, Brilinta, amlodipine, metoprolol, and Lipitor.   2. Hypertension: BP well controlled. Continue current antihypertensive regimen.   3. Bilateral lower extremity edema: Echo in 05/2023 showed EF 65 to 70%, normal LV function, no RWMA, mild LVH, G1 DD, normal RV systolic function, mild to moderate MAC.  She has nonpitting bilateral lower extremity edema, improved with Lasix (when she takes it regularly).  She denies any dyspnea, PND, orthopnea, weight gain.  Continue Lasix.  3. Hyperlipidemia: LDL was 64 in 06/2022.  Continue aspirin, Lipitor.   4. History of CVA: No documented recurrence.  Continue aspirin, Lipitor.   5. CKD stage IIIb: Creatinine was 1.65 in 12/2022. Stable.    6. Disposition: Follow-up in 4-6 months with Dr. Jens Som.  Joylene Grapes, NP 04/13/2023, 12:31 PM

## 2023-04-16 ENCOUNTER — Ambulatory Visit
Admission: RE | Admit: 2023-04-16 | Discharge: 2023-04-16 | Disposition: A | Discharge status: Home / Self Care | Payer: Medicare Other | Source: Ambulatory Visit | Attending: Adult Health | Admitting: Adult Health

## 2023-04-16 DIAGNOSIS — R4189 Other symptoms and signs involving cognitive functions and awareness: Secondary | ICD-10-CM

## 2023-04-16 DIAGNOSIS — H5462 Unqualified visual loss, left eye, normal vision right eye: Secondary | ICD-10-CM

## 2023-04-16 DIAGNOSIS — R413 Other amnesia: Secondary | ICD-10-CM | POA: Diagnosis not present

## 2023-04-16 DIAGNOSIS — R4689 Other symptoms and signs involving appearance and behavior: Secondary | ICD-10-CM | POA: Diagnosis not present

## 2023-04-16 DIAGNOSIS — H538 Other visual disturbances: Secondary | ICD-10-CM | POA: Diagnosis not present

## 2023-04-20 DIAGNOSIS — H401123 Primary open-angle glaucoma, left eye, severe stage: Secondary | ICD-10-CM | POA: Diagnosis not present

## 2023-04-21 DIAGNOSIS — R609 Edema, unspecified: Secondary | ICD-10-CM | POA: Diagnosis not present

## 2023-04-21 DIAGNOSIS — I8310 Varicose veins of unspecified lower extremity with inflammation: Secondary | ICD-10-CM | POA: Diagnosis not present

## 2023-04-22 ENCOUNTER — Encounter: Payer: Self-pay | Admitting: Physical Therapy

## 2023-04-22 ENCOUNTER — Ambulatory Visit: Payer: Medicare Other | Attending: Orthopaedic Surgery | Admitting: Physical Therapy

## 2023-04-22 DIAGNOSIS — M6281 Muscle weakness (generalized): Secondary | ICD-10-CM | POA: Insufficient documentation

## 2023-04-22 DIAGNOSIS — R262 Difficulty in walking, not elsewhere classified: Secondary | ICD-10-CM | POA: Diagnosis present

## 2023-04-22 DIAGNOSIS — R2681 Unsteadiness on feet: Secondary | ICD-10-CM | POA: Diagnosis not present

## 2023-04-22 DIAGNOSIS — Z9181 History of falling: Secondary | ICD-10-CM | POA: Diagnosis not present

## 2023-04-22 DIAGNOSIS — W19XXXD Unspecified fall, subsequent encounter: Secondary | ICD-10-CM | POA: Insufficient documentation

## 2023-04-22 DIAGNOSIS — R293 Abnormal posture: Secondary | ICD-10-CM | POA: Insufficient documentation

## 2023-04-22 NOTE — Therapy (Signed)
OUTPATIENT PHYSICAL THERAPY EVALUATION   Patient Name: Laurie [name] MRN: [id number] DOB:08-May-1932, 89 y.o., female Today's Date: 04/22/2023  END OF SESSION:  PT End of Session - 04/22/23 1155     Visit Number 1    Number of Visits 17    Date for PT Re-Evaluation 06/17/23    Authorization Type [employer] MCR    PT Start Time 1150    PT Stop Time 1232    PT Time Calculation (min) 42 min    Equipment Utilized During Treatment Gait belt    Activity Tolerance Patient tolerated treatment well    Behavior During Therapy WFL for tasks assessed/performed             Past Medical History:  Diagnosis Date   CKD (chronic kidney disease), stage III (HCC)    Family history of breast cancer    mother   Family history of colon cancer    father   Glaucoma    Osteopenia    SBO (small bowel obstruction) (HCC)    Past Surgical History:  Procedure Laterality Date   APPENDECTOMY     CESAREAN SECTION     x6   CORONARY STENT INTERVENTION N/A 06/08/2022   Procedure: CORONARY STENT INTERVENTION;  Surgeon: Lennette Bihari, MD;  Location: [facility];  Service: Cardiovascular;  Laterality: N/A;   ESOPHAGOGASTRODUODENOSCOPY  07/07/2012   Procedure: ESOPHAGOGASTRODUODENOSCOPY (EGD);  Surgeon: Willis Modena, MD;  Location: [facility] ENDOSCOPY;  Service: Endoscopy;  Laterality: N/A;   GLAUCOMA SURGERY Bilateral 02/17/2022   HIP SURGERY     2 rods were put on left hip   LEFT COLECTOMY  04/30/2011   Sr Streck   LEFT HEART CATH AND CORONARY ANGIOGRAPHY N/A 06/08/2022   Procedure: LEFT HEART CATH AND CORONARY ANGIOGRAPHY;  Surgeon: Lennette Bihari, MD;  Location: [facility];  Service: Cardiovascular;  Laterality: N/A;   tubal cyst  04/30/2011   L fallopian tube cyst incidentally found   Patient Active Problem List   Diagnosis Date Noted   Non-ST elevation (NSTEMI) myocardial infarction (HCC) 06/06/2022   Elevated troponin 06/05/2022   Hiatal hernia 06/05/2022   Elevated d-dimer  06/05/2022   Abnormal urinalysis 06/05/2022   HLD (hyperlipidemia) 06/05/2022   History of stroke 06/05/2022   Chronic kidney disease, stage 3b (HCC) 06/05/2022   Uncontrolled hypertension 12/30/2021   Glaucoma 12/29/2021   Acute ischemic stroke (HCC) 12/28/2021   Chronic pain of right knee 01/15/2020   Unilateral primary osteoarthritis, left knee 01/25/2018   Unilateral primary osteoarthritis, right knee 01/25/2018   Chronic pain of both knees 01/25/2018   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HCC) 12/22/2010    PCP: Daisy Floro, MD  REFERRING PROVIDER: Huel Cote, MD   REFERRING DIAG: W19.XXXD (ICD-10-CM) - Fall, subsequent encounter (Referral comment: "Gait training  Aquatic therapy")  Rationale for Evaluation and Treatment: Rehabilitation  THERAPY DIAG:  Unsteady gait  Difficulty in walking, not elsewhere classified  Abnormal posture  Muscle weakness (generalized)  ONSET DATE: 10/2022  SUBJECTIVE:  SUBJECTIVE STATEMENT: Patient arrives with daughter for PT evaluation to focus on balance and reducing fall risk.  She reports no pain.  "I cannot figure out while I am always falling backwards"  PERTINENT HISTORY:  CKD, osteopenia, SBO, hx MI, HTN, hx CVA June 2023  L hip ORIF medullary nailing s/p fall DC home health 10/2021.   PAIN:  Are you having pain: none  Location/description: NA Best-worst over past week: NA   - aggravating factors: NA  - Easing factors: NA     PRECAUTIONS: Right eye blindness, left eye glaucoma with impairment, cardiac hx, fall risk, osteopenia  WEIGHT BEARING RESTRICTIONS: No  FALLS:  Has patient fallen in last 6 months? Yes. Number of falls 1  LIVING ENVIRONMENT: Lives with: lives with their son Lives in: House/apartment Stairs: Yes: External: 2  steps; on right going up Has following equipment at home: Quad cane large base, Environmental consultant - 4 wheeled, and Wheelchair (manual)  OCCUPATION: Not  PLOF: Requires assistive device for independence, Needs assistance with ADLs, Needs assistance with homemaking, Needs assistance with gait, and Leisure: reads the newspaper, light cleaning, plants  Daughter reports the patient is independent with ambulation in her home using a quad cane but she is not without 24-hour supervision.   PATIENT GOALS: I want to be able to walk better and move like I used to be  NEXT MD VISIT: Multiple visits as needed  OBJECTIVE:   DIAGNOSTIC FINDINGS:     MRI brain (without) demonstrating: - Moderate atrophy and moderate chronic small vessel ischemic disease. - Few chronic cerebral microhemorrhages. - No acute findings. No change from 12/29/21.    COMPARISON:  01/29/2023   FINDINGS: Stable hardware is noted in the proximal left femur. No acute fracture or dislocation is noted. No soft tissue changes are seen. Significant degenerative changes about the left knee joint are noted.   IMPRESSION: Postsurgical changes in the left hip. Degenerative changes in the left knee. The overall appearance is stable from the prior exam.        SURVEYS:  NT on eval   SCREENING FOR RED FLAGS: Red flag questioning/screening reassuring NEG   COGNITION: Overall cognitive status: History of cognitive impairments - at baseline     SENSATION: WFL  POSTURE: Posterior leanrounded shoulders, forward head, increased thoracic kyphosis, flexed trunk , and posterior lean  PALPATION: N/A   LOWER EXTREMITY ROM:     Active  Right eval Left eval  Hip flexion    Hip extension    Ankle DF  Lacks 8 deg  Lacks 8 deg   Hip external rotation    Knee extension    Knee flexion    (Blank rows = not tested) (Key: WFL = within functional limits not formally assessed, * = concordant pain, s = stiffness/stretching sensation, NT =  not tested)  Comments:    LOWER EXTREMITY MMT:    MMT Right eval Left eval  Hip flexion 4 4  Hip abduction (modified sitting)    Hip internal rotation     Hip external rotation    Knee flexion 4+ 4+  Knee extension 4+ 4+  Ankle dorsiflexion     (Blank rows = not tested) (Key: WFL = within functional limits not formally assessed, * = concordant pain, s = stiffness/stretching sensation, NT = not tested)  Comments:     FUNCTIONAL TESTS:  5 times sit to stand: 28 sec  with min A and hands on the chair 2 minute walk test:  100 feet   GAIT: Distance walked: 100  Assistive device utilized: Quad cane large base Level of assistance: Min A Comments: Posterior lean maximal cueing required to use the cane firmly on the ground, improved step length and heel strike  TODAY'S TREATMENT:                                                                                                                              [facility] Adult PT Treatment:                                                DATE: 04/22/23 PT evaluation completed assessing safety with gait and transfers, posture, balance reactions.  p PATIENT EDUCATION:  Education details: Pt education on PT impairments, prognosis, and POC. Informed consent. Rationale for interventions, safe/appropriate HEP performance Person educated: Patient Education method: Explanation, Demonstration, Tactile cues, Verbal cues, and Handouts Education comprehension: verbalized understanding, returned demonstration, verbal cues required, tactile cues required, and needs further education    HOME EXERCISE PROGRAM: None on eval.   ASSESSMENT:  CLINICAL IMPRESSION: Patient is an 89 y.o. woman who was seen today for physical therapy evaluation and treatment for mobility deficits s/p fall. Per referral and review of referring provider's note, physician recommendation for gait training and aquatic therapy. Recommend trial of skilled PT to address aforementioned deficits  with aim of improving functional tolerance and reducing pain with typical activities.  Patient required minimal assist for most transfers and all ambulation and in novel environments.  Her lack of ankle range of motion is the cause of her posterior lean and loss of balance.  She lacks safety awareness with transfers and use of the cane.  I recommend she use a walker at all times especially when she is out of the house.  The patient was interested in trying aquatic therapy as recommended by her surgeon.  And although it might be helpful for her to tolerate exercises aquatics might pose a risk for loss of balance.  Safety in the pool may also depend on skin integrity as she has multiple bruises on her legs from bumping into things at home.  Patient is very limited in ability to visualize her environment.   OBJECTIVE IMPAIRMENTS: Abnormal gait, decreased activity tolerance, decreased balance, decreased cognition, decreased coordination, decreased endurance, decreased knowledge of condition, decreased knowledge of use of DME, decreased mobility, difficulty walking, decreased ROM, decreased strength, decreased safety awareness, increased edema, increased fascial restrictions, impaired perceived functional ability, impaired flexibility, impaired vision/preception, improper body mechanics, and postural dysfunction.   ACTIVITY LIMITATIONS: carrying, lifting, bending, standing, squatting, stairs, transfers, bed mobility, and locomotion level  PARTICIPATION LIMITATIONS: meal prep, cleaning, shopping, and community activity  PERSONAL FACTORS: Age, Time since onset of injury/illness/exacerbation, and 3+ comorbidities: visual deficits, osteopenia, cognition  are also affecting patient's functional  outcome.   REHAB POTENTIAL: Fair given severity of deficits, comorbidities  CLINICAL DECISION MAKING: Evolving/moderate complexity  EVALUATION COMPLEXITY: Moderate   GOALS: Goals reviewed with patient? No - on eval  did discuss role of PT, PT POC  SHORT TERM GOALS: Target date: 05/07/2023 Pt will demonstrate appropriate understanding and performance of initially prescribed HEP in order to facilitate improved independence with management of symptoms.  Baseline: HEP provided on eval Goal status: INITIAL   2. Pt will score greater than or equal to NT on FOTO in order to demonstrate improved perception of function due to symptoms.  Baseline: NT   Goal status: INITIAL    LONG TERM GOALS: Target date: 06/04/2023 Pt will score NT on FOTO in order to demonstrate improved perception of functional status due to symptoms.  Baseline: NT  Goal status: INITIAL  2.  Pt will be able to perform sit to stand w/ safe mechanics and LRAD assistive device in order to facilitate improved safety w/ transfers. Baseline: needs min A  Goal status: INITIAL  3.  Pt will be able to ambulate 150 feet mod I  LRAD in order to facilitate improved safety w/ household navigation. Baseline: 100 feet min A quad cane  Goal status: INITIAL  4. Pt will demonstrate appropriate performance of final prescribed HEP in order to facilitate improved self-management of symptoms post-discharge.   Baseline: initial HEP prescribed  Goal status: INITIAL    5. Pt will demonstrate 5 x STS x 15 sec in order to facilitate improved functional strength.  Baseline: 28 sec   Goal status: INITIA   PLAN:  PT FREQUENCY: 1-2x/week  PT DURATION: 6 weeks  PLANNED INTERVENTIONS: Therapeutic exercises, Therapeutic activity, Neuromuscular re-education, Balance training, Gait training, Patient/Family education, Self Care, Joint mobilization, DME instructions, Aquatic Therapy, and Re-evaluation.  PLAN FOR NEXT SESSION: do FOTO? Or another objective survey . Review/update HEP PRN. Work on [employer] exercises as appropriate with emphasis on balance, ankle ROM general strength . Symptom modification strategies as indicated/appropriate.     Karie Mainland,  PT 04/22/23 3:08 PM Phone: [phone] Fax: [phone]

## 2023-04-23 ENCOUNTER — Encounter: Payer: Self-pay | Admitting: Physical Therapy

## 2023-04-26 DIAGNOSIS — R0789 Other chest pain: Secondary | ICD-10-CM | POA: Diagnosis not present

## 2023-05-25 ENCOUNTER — Ambulatory Visit (HOSPITAL_BASED_OUTPATIENT_CLINIC_OR_DEPARTMENT_OTHER): Payer: Medicare Other

## 2023-05-25 ENCOUNTER — Ambulatory Visit (HOSPITAL_BASED_OUTPATIENT_CLINIC_OR_DEPARTMENT_OTHER): Payer: Medicare Other | Admitting: Student

## 2023-05-25 ENCOUNTER — Encounter (HOSPITAL_BASED_OUTPATIENT_CLINIC_OR_DEPARTMENT_OTHER): Payer: Self-pay | Admitting: Student

## 2023-05-25 DIAGNOSIS — M1711 Unilateral primary osteoarthritis, right knee: Secondary | ICD-10-CM | POA: Diagnosis not present

## 2023-05-25 DIAGNOSIS — M170 Bilateral primary osteoarthritis of knee: Secondary | ICD-10-CM

## 2023-05-25 DIAGNOSIS — M25562 Pain in left knee: Secondary | ICD-10-CM

## 2023-05-25 DIAGNOSIS — M25561 Pain in right knee: Secondary | ICD-10-CM

## 2023-05-25 MED ORDER — LIDOCAINE HCL 1 % IJ SOLN
4.0000 mL | INTRAMUSCULAR | Status: AC | PRN
Start: 2023-05-25 — End: 2023-05-25
  Administered 2023-05-25: 4 mL

## 2023-05-25 MED ORDER — TRIAMCINOLONE ACETONIDE 40 MG/ML IJ SUSP
2.0000 mL | INTRAMUSCULAR | Status: AC | PRN
Start: 2023-05-25 — End: 2023-05-25
  Administered 2023-05-25: 2 mL via INTRA_ARTICULAR

## 2023-05-25 NOTE — Progress Notes (Signed)
Chief Complaint: Right knee pain     History of Present Illness:    Laurie [name] is a 89 y.o. female with history of bilateral knee osteoarthritis presenting today with her 2 sons for evaluation of right knee pain.  Patient states that when she woke up this morning with moderate to severe pain in the knee.  Denies any injury.  She was last evaluated on 08/07/2022 for evaluation of this knee by Dr. Steward Drone and received a cortisone injection which did get her long-lasting relief.  She did try taking some Tylenol this morning which has helped her pain levels.  Pain is located mainly over the lateral knee.  Surgical History:   None  PMH/PSH/Family History/Social History/Meds/Allergies:    Past Medical History:  Diagnosis Date   CKD (chronic kidney disease), stage III (HCC)    Family history of breast cancer    mother   Family history of colon cancer    father   Glaucoma    Osteopenia    SBO (small bowel obstruction) (HCC)    Past Surgical History:  Procedure Laterality Date   APPENDECTOMY     CESAREAN SECTION     x6   CORONARY STENT INTERVENTION N/A 06/08/2022   Procedure: CORONARY STENT INTERVENTION;  Surgeon: Lennette Bihari, MD;  Location: [facility];  Service: Cardiovascular;  Laterality: N/A;   ESOPHAGOGASTRODUODENOSCOPY  07/07/2012   Procedure: ESOPHAGOGASTRODUODENOSCOPY (EGD);  Surgeon: Willis Modena, MD;  Location: [facility] ENDOSCOPY;  Service: Endoscopy;  Laterality: N/A;   GLAUCOMA SURGERY Bilateral 02/17/2022   HIP SURGERY     2 rods were put on left hip   LEFT COLECTOMY  04/30/2011   [facility]   LEFT HEART CATH AND CORONARY ANGIOGRAPHY N/A 06/08/2022   Procedure: LEFT HEART CATH AND CORONARY ANGIOGRAPHY;  Surgeon: Lennette Bihari, MD;  Location: [facility];  Service: Cardiovascular;  Laterality: N/A;   tubal cyst  04/30/2011   L fallopian tube cyst incidentally found   Social History   Socioeconomic History    Marital status: Widowed    Spouse name: Not on file   Number of children: Not on file   Years of education: Not on file   Highest education level: Not on file  Occupational History   Not on file  Tobacco Use   Smoking status: Former    Current packs/day: 0.50    Types: Cigarettes   Smokeless tobacco: Not on file  Vaping Use   Vaping status: Not on file  Substance and Sexual Activity   Alcohol use: No    Comment: rare   Drug use: No   Sexual activity: Not Currently  Other Topics Concern   Not on file  Social History Narrative   Not on file   Social Determinants of Health   Financial Resource Strain: Low Risk  (11/20/2022)   Received from [facility], [facility]   Overall Financial Resource Strain (CARDIA)    Difficulty of Paying Living Expenses: Not hard at all  Food Insecurity: No Food Insecurity (11/20/2022)   Received from [facility], [facility]   Hunger Vital Sign    Worried About Running Out of Food in the Last Year: Never true    Ran Out of Food in  the Last Year: Never true  Transportation Needs: No Transportation Needs (11/20/2022)   Received from [facility], [facility]   PRAPARE - Transportation    Lack of Transportation (Medical): No    Lack of Transportation (Non-Medical): No  Physical Activity: Not on file  Stress: Not on file  Social Connections: Unknown (01/07/2022)   Received from [facility], [facility]   Social Network    Social Network: Not on file   Family History  Problem Relation Age of Onset   Breast cancer Mother    Colon cancer Father    Diabetes Sister    Allergies  Allergen Reactions   Actonel [Risedronate] Nausea And Vomiting   Fosamax [Alendronate] Nausea And Vomiting   Miacalcin Other (See Comments)    Unknown reaction   Neomycin Other (See Comments)    Unknown reaction   Neosporin [Neomycin-Polymyxin-Gramicidin] Swelling    Neurontin [Gabapentin] Other (See Comments)    Confusion   Codeine Palpitations   Current Outpatient Medications  Medication Sig Dispense Refill   acetaminophen (TYLENOL) 500 MG tablet Take 1,000 mg by mouth every 6 (six) hours as needed.     amLODipine (NORVASC) 2.5 MG tablet Take 2.5 mg by mouth daily.     aspirin EC 81 MG tablet Take 1 tablet (81 mg total) by mouth daily. Swallow whole. 30 tablet 12   atorvastatin (LIPITOR) 40 MG tablet Take 1 tablet (40 mg total) by mouth daily. 30 tablet 11   brimonidine (ALPHAGAN) 0.2 % ophthalmic solution Place 1 drop into both eyes 3 (three) times daily.     dorzolamide-timolol (COSOPT) 22.3-6.8 MG/ML ophthalmic solution 1 drop 2 (two) times daily.       latanoprost (XALATAN) 0.005 % ophthalmic solution Place 1 drop into both eyes every evening. (Patient not taking: Reported on 04/05/2023)     methocarbamol (ROBAXIN) 500 MG tablet Take 1 tablet (500 mg total) by mouth 3 (three) times daily. (Patient not taking: Reported on 04/05/2023) 30 tablet 3   metoprolol tartrate (LOPRESSOR) 25 MG tablet Take 0.5 tablets (12.5 mg total) by mouth 2 (two) times daily. 60 tablet 2   nitroGLYCERIN (NITROSTAT) 0.4 MG SL tablet Place 1 tablet (0.4 mg total) under the tongue every 5 (five) minutes as needed for chest pain. 30 tablet 12   OPTIVE 0.5-0.9 % ophthalmic solution Place 1 drop into both eyes 4 (four) times daily as needed for dry eyes. (Patient not taking: Reported on 04/05/2023)     pantoprazole (PROTONIX) 40 MG tablet Take 1 tablet (40 mg total) by mouth daily. (Patient not taking: Reported on 04/05/2023) 30 tablet 0   RHOPRESSA 0.02 % SOLN Place 1 drop into both eyes every evening.     ticagrelor (BRILINTA) 90 MG TABS tablet Take 1 tablet (90 mg total) by mouth 2 (two) times daily. 60 tablet 2   No current facility-administered medications for this visit.   No results found.  Review of Systems:   A ROS was performed including pertinent positives and negatives  as documented in the HPI.  Physical Exam :   Constitutional: NAD and appears stated age Neurological: Alert and oriented Psych: Appropriate affect and cooperative There were no vitals taken for this visit.   Comprehensive Musculoskeletal Exam:    Active range of motion of the right knee from 10 to 90 degrees.  Positive lateral joint line tenderness.  Mild effusion present.  No evidence of erythema or ecchymosis.  No instability with varus or  valgus stress.  Imaging:   Xray (right knee 4 views): Advanced tricompartmental osteoarthritis with chondrocalcinosis present.  No acute abnormality.   I personally reviewed and interpreted the radiographs.   Assessment:   89 y.o. female with advanced osteoarthritis and chondrocalcinosis of the right knee.  She did last received a cortisone injection 10 months ago which gave her good relief.  Given this I do believe she would benefit from repeat injection today.  Injection was performed of the right knee today and patient tolerated this well.  She did have cortisone placed in the left knee on 01/29/2023 and has no pain or issues with this today.  Will plan to assess relief and follow back up as needed.  Plan :    - Cortisone injection performed of the right knee today after consent obtained from patient and sons - Return to clinic as needed     Procedure Note  Patient: Laurie [name]             Date of Birth: [birth year]           MRN: [id number]             Visit Date: 05/25/2023  Procedures: Visit Diagnoses:  1. Acute pain of right knee     Large Joint Inj: R knee on 05/25/2023 4:39 PM Indications: pain Details: 22 G 1.5 in needle, anterolateral approach Medications: 4 mL lidocaine 1 %; 2 mL triamcinolone acetonide 40 MG/ML Outcome: tolerated well, no immediate complications Procedure, treatment alternatives, risks and benefits explained, specific risks discussed. Consent was given by the patient. Immediately prior to procedure a  time out was called to verify the correct patient, procedure, equipment, support staff and site/side marked as required. Patient was prepped and draped in the usual sterile fashion.      I personally saw and evaluated the patient, and participated in the management and treatment plan.  Hazle Nordmann, PA-C Orthopedics

## 2023-06-01 ENCOUNTER — Telehealth: Payer: Self-pay | Admitting: Nurse Practitioner

## 2023-06-01 MED ORDER — TICAGRELOR 90 MG PO TABS: 90.0000 mg | ORAL_TABLET | Freq: Two times a day (BID) | ORAL | 3 refills | Status: DC

## 2023-06-01 NOTE — Telephone Encounter (Signed)
*  STAT* If patient is at the pharmacy, call can be transferred to refill team.   1. Which medications need to be refilled? (please list name of each medication and dose if known) ticagrelor (BRILINTA) 90 MG TABS tablet   2. Which pharmacy/location (including street and city if local pharmacy) is medication to be sent to? [id number] - [place], [place] - [address] Phone: [phone]  Fax: [phone]     3. Do they need a 30 day or 90 day supply? 17  Pt's son would like a call back letting him know this has been sent  Pt is completely out of medication.

## 2023-06-01 NOTE — Telephone Encounter (Signed)
Refill sent to [facility] for Brilinta per request for 90 day supp. Son notified.

## 2023-06-08 DIAGNOSIS — H401133 Primary open-angle glaucoma, bilateral, severe stage: Secondary | ICD-10-CM | POA: Diagnosis not present

## 2023-06-08 DIAGNOSIS — H353230 Exudative age-related macular degeneration, bilateral, stage unspecified: Secondary | ICD-10-CM | POA: Diagnosis not present

## 2023-06-14 ENCOUNTER — Ambulatory Visit (INDEPENDENT_AMBULATORY_CARE_PROVIDER_SITE_OTHER): Payer: Medicare Other | Admitting: Podiatry

## 2023-06-14 ENCOUNTER — Telehealth: Payer: Self-pay | Admitting: Physical Therapy

## 2023-06-14 DIAGNOSIS — M79675 Pain in left toe(s): Secondary | ICD-10-CM

## 2023-06-14 DIAGNOSIS — M79674 Pain in right toe(s): Secondary | ICD-10-CM | POA: Diagnosis not present

## 2023-06-14 DIAGNOSIS — B351 Tinea unguium: Secondary | ICD-10-CM | POA: Diagnosis not present

## 2023-06-14 NOTE — Progress Notes (Signed)
  Subjective:  Patient ID: Laurie [name], female    DOB: 1932/06/16,   MRN: [id number]  No chief complaint on file.   89 y.o. female presents for concern of thickened elongated and painful toenails. Difficult to trim herself. She is not diabetic and not on blood thinners. Also relates most pain in the right great toe.   . Denies any other pedal complaints. Denies n/v/f/c.   Past Medical History:  Diagnosis Date   CKD (chronic kidney disease), stage III (HCC)    Family history of breast cancer    mother   Family history of colon cancer    father   Glaucoma    Osteopenia    SBO (small bowel obstruction) (HCC)     Objective:  Physical Exam: Vascular: DP/PT pulses 2/4 bilateral. CFT <3 seconds. Normal hair growth on digits. No edema.  Skin. No lacerations or abrasions bilateral feet. Nails 1-5 b/l are thickened elongated and with subungual debris. Diminished fat pad to ball of foot   Musculoskeletal: MMT 5/5 bilateral lower extremities in DF, PF, Inversion and Eversion. Deceased ROM in DF of ankle joint.  Neurological: Sensation intact to light touch.   Assessment:   1. Pain due to onychomycosis of toenails of both feet       Plan:  Patient was evaluated and treated and all questions answered. -Discussed and educated patient on diabetic foot care, especially with  regards to the vascular, neurological and musculoskeletal systems.  -Discussed supportive shoes at all times and checking feet regularly.  -Mechanically debrided all nails 1-5 bilateral using sterile nail nipper and filed with dremel without incident -Answered all patient questions -Patient to return as needed.  -Patient advised to call the office if any problems or questions arise in the meantime.   Louann Sjogren, DPM

## 2023-06-14 NOTE — Telephone Encounter (Signed)
Patients son calling back (left message) to schedule mom for PT states spoke with Janine Limbo who stated she would call back this afternoon.

## 2023-06-22 ENCOUNTER — Encounter: Payer: Self-pay | Admitting: Physical Therapy

## 2023-06-22 ENCOUNTER — Ambulatory Visit: Payer: Medicare Other | Attending: Orthopaedic Surgery | Admitting: Physical Therapy

## 2023-06-22 DIAGNOSIS — R293 Abnormal posture: Secondary | ICD-10-CM | POA: Diagnosis not present

## 2023-06-22 DIAGNOSIS — M6281 Muscle weakness (generalized): Secondary | ICD-10-CM | POA: Insufficient documentation

## 2023-06-22 DIAGNOSIS — R2681 Unsteadiness on feet: Secondary | ICD-10-CM | POA: Insufficient documentation

## 2023-06-22 DIAGNOSIS — M542 Cervicalgia: Secondary | ICD-10-CM | POA: Diagnosis not present

## 2023-06-22 DIAGNOSIS — R262 Difficulty in walking, not elsewhere classified: Secondary | ICD-10-CM | POA: Insufficient documentation

## 2023-06-22 NOTE — Therapy (Signed)
OUTPATIENT PHYSICAL THERAPY TREATMENT/RE-EVAL   Patient Name: Laurie [name] MRN: [id number] DOB:[birth year], 89 y.o., female Today's Date: 06/22/2023  END OF SESSION:  PT End of Session - 06/22/23 1110     Visit Number 2    Number of Visits 17    Date for PT Re-Evaluation 08/17/23    Authorization Type [employer] MCR    PT Start Time 1105    PT Stop Time 1145    PT Time Calculation (min) 40 min    Activity Tolerance Patient tolerated treatment well    Behavior During Therapy WFL for tasks assessed/performed             Past Medical History:  Diagnosis Date   CKD (chronic kidney disease), stage III (HCC)    Family history of breast cancer    mother   Family history of colon cancer    father   Glaucoma    Osteopenia    SBO (small bowel obstruction) (HCC)    Past Surgical History:  Procedure Laterality Date   APPENDECTOMY     CESAREAN SECTION     x6   CORONARY STENT INTERVENTION N/A 06/08/2022   Procedure: CORONARY STENT INTERVENTION;  Surgeon: Lennette Bihari, MD;  Location: [facility];  Service: Cardiovascular;  Laterality: N/A;   ESOPHAGOGASTRODUODENOSCOPY  07/07/2012   Procedure: ESOPHAGOGASTRODUODENOSCOPY (EGD);  Surgeon: Willis Modena, MD;  Location: [facility] ENDOSCOPY;  Service: Endoscopy;  Laterality: N/A;   GLAUCOMA SURGERY Bilateral 02/17/2022   HIP SURGERY     2 rods were put on left hip   LEFT COLECTOMY  04/30/2011   Sr Streck   LEFT HEART CATH AND CORONARY ANGIOGRAPHY N/A 06/08/2022   Procedure: LEFT HEART CATH AND CORONARY ANGIOGRAPHY;  Surgeon: Lennette Bihari, MD;  Location: [facility];  Service: Cardiovascular;  Laterality: N/A;   tubal cyst  04/30/2011   L fallopian tube cyst incidentally found   Patient Active Problem List   Diagnosis Date Noted   Non-ST elevation (NSTEMI) myocardial infarction (HCC) 06/06/2022   Elevated troponin 06/05/2022   Hiatal hernia 06/05/2022   Elevated d-dimer 06/05/2022   Abnormal urinalysis 06/05/2022    HLD (hyperlipidemia) 06/05/2022   History of stroke 06/05/2022   Chronic kidney disease, stage 3b (HCC) 06/05/2022   Uncontrolled hypertension 12/30/2021   Glaucoma 12/29/2021   Acute ischemic stroke (HCC) 12/28/2021   Chronic pain of right knee 01/15/2020   Unilateral primary osteoarthritis, left knee 01/25/2018   Unilateral primary osteoarthritis, right knee 01/25/2018   Chronic pain of both knees 01/25/2018   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HCC) 12/22/2010    PCP: Daisy Floro, MD  REFERRING PROVIDER: Huel Cote, MD   REFERRING DIAG: W19.XXXD (ICD-10-CM) - Fall, subsequent encounter (Referral comment: "Gait training  Aquatic therapy")  Rationale for Evaluation and Treatment: Rehabilitation  THERAPY DIAG:  Unsteady gait  Difficulty in walking, not elsewhere classified  Abnormal posture  Muscle weakness (generalized)  Cervicalgia  ONSET DATE: 10/2022  SUBJECTIVE:  SUBJECTIVE STATEMENT: Pt here for Re-eval.  Her son states that no one reached out to schedules her appt Dr still wants her to go in the pool.  She denies pain .  Walks in with walker.   PERTINENT HISTORY:  CKD, osteopenia, SBO, hx MI, HTN, hx CVA June 2023  L hip ORIF medullary nailing s/p fall DC home health 10/2021.   PAIN:  Are you having pain: none  Location/description: NA Best-worst over past week: NA   - aggravating factors: NA  - Easing factors: NA     PRECAUTIONS: Right eye blindness, left eye glaucoma with impairment, cardiac hx, fall risk, osteopenia  WEIGHT BEARING RESTRICTIONS: No  FALLS:  Has patient fallen in last 6 months? Yes. Number of falls 1  LIVING ENVIRONMENT: Lives with: lives with their son Lives in: House/apartment Stairs: Yes: External: 2 steps; on right going up Has following  equipment at home: Quad cane large base, Environmental consultant - 4 wheeled, and Wheelchair (manual)  OCCUPATION: Not  PLOF: Requires assistive device for independence, Needs assistance with ADLs, Needs assistance with homemaking, Needs assistance with gait, and Leisure: reads the newspaper, light cleaning, plants  Daughter reports the patient is independent with ambulation in her home using a quad cane but she is not without 24-hour supervision.   PATIENT GOALS: I want to be able to walk better and move like I used to be  NEXT MD VISIT: Multiple visits as needed  OBJECTIVE:   DIAGNOSTIC FINDINGS:     MRI brain (without) demonstrating: - Moderate atrophy and moderate chronic small vessel ischemic disease. - Few chronic cerebral microhemorrhages. - No acute findings. No change from 12/29/21.    COMPARISON:  01/29/2023   FINDINGS: Stable hardware is noted in the proximal left femur. No acute fracture or dislocation is noted. No soft tissue changes are seen. Significant degenerative changes about the left knee joint are noted.   IMPRESSION: Postsurgical changes in the left hip. Degenerative changes in the left knee. The overall appearance is stable from the prior exam.        SURVEYS:  NT on eval   SCREENING FOR RED FLAGS: Red flag questioning/screening reassuring NEG   COGNITION: Overall cognitive status: History of cognitive impairments - at baseline     SENSATION: WFL  POSTURE: Posterior leanrounded shoulders, forward head, increased thoracic kyphosis, flexed trunk , and posterior lean  PALPATION: N/A   LOWER EXTREMITY ROM:     Active  Right eval Left eval Rt./Lt.  06/22/23  Hip flexion     Hip extension     Ankle DF  Lacks 8 deg  Lacks 8 deg  Approx 10 deg bilateral   Hip external rotation     Knee extension   -15  Knee flexion   105  (Blank rows = not tested) (Key: WFL = within functional limits not formally assessed, * = concordant pain, s = stiffness/stretching  sensation, NT = not tested)  Comments:    LOWER EXTREMITY MMT:    MMT Right eval Left eval Rt./lt. 06/22/23  Hip flexion 4 4 4   Hip abduction (modified sitting)     Hip internal rotation      Hip external rotation     Knee flexion 4+ 4+ 4+  Knee extension 4+ 4+ 4+  Ankle dorsiflexion   3-/5   (Blank rows = not tested) (Key: WFL = within functional limits not formally assessed, * = concordant pain, s = stiffness/stretching sensation, NT = not tested)  Comments:     FUNCTIONAL TESTS:  5 times sit to stand: 28 sec  with min A and hands on the chair 2 minute walk test: 100 feet    06/22/23 2 min walk test 137 feet with rolling walker, min A  GAIT: Distance walked: 100  Assistive device utilized: Quad cane large base Level of assistance: Min A Comments: Posterior lean maximal cueing required to use the cane firmly on the ground, improved step length and heel strike  TODAY'S TREATMENT:        [facility] Adult PT Treatment:                                                DATE: 06/22/23 Therapeutic Exercise: Supine bridge x 15  SLR  x 10  Hamstring, calf stretch with sheet  Hip abd/ER green band x 15  Therapeutic Activity: 2 min walk 137 feet with rolling walker , min A  no increased pain  Self Care: Start on land   Transition to DB when able to schedule.                                                                                                                            [facility] Adult PT Treatment:                                                DATE: 04/22/23 PT evaluation completed assessing safety with gait and transfers, posture, balance reactions.     PATIENT EDUCATION:  Education details: Pt education on PT impairments, prognosis, and POC. Informed consent. Rationale for interventions, safe/appropriate HEP performance Person educated: Patient Education method: Explanation, Demonstration, Tactile cues, Verbal cues, and Handouts Education comprehension: verbalized  understanding, returned demonstration, verbal cues required, tactile cues required, and needs further education    HOME EXERCISE PROGRAM: None on eval.   ASSESSMENT:  CLINICAL IMPRESSION:  Patient returns for treatment today.  She did not get scheduled after the initial evaluation.   Aquatics not advisable due to limited caregiver support to enter the pool, balance deficits, visual deficits, lack of pain and land therapy well tolerated. She was given initial HEP for supine trunk and hip mobility.  She would like to eventually get to a regular schedule closer to her home at [facility]. She needs Min A and cues for finding her way due to limited vision.     OBJECTIVE IMPAIRMENTS: Abnormal gait, decreased activity tolerance, decreased balance, decreased cognition, decreased coordination, decreased endurance, decreased knowledge of condition, decreased knowledge of use of DME, decreased mobility, difficulty walking, decreased ROM, decreased strength, decreased safety awareness, increased edema, increased fascial restrictions, impaired perceived functional ability, impaired flexibility, impaired vision/preception, improper body  mechanics, and postural dysfunction.   ACTIVITY LIMITATIONS: carrying, lifting, bending, standing, squatting, stairs, transfers, bed mobility, and locomotion level  PARTICIPATION LIMITATIONS: meal prep, cleaning, shopping, and community activity  PERSONAL FACTORS: Age, Time since onset of injury/illness/exacerbation, and 3+ comorbidities: visual deficits, osteopenia, cognition  are also affecting patient's functional outcome.   REHAB POTENTIAL: Fair given severity of deficits, comorbidities  CLINICAL DECISION MAKING: Evolving/moderate complexity  EVALUATION COMPLEXITY: Moderate   GOALS: Goals reviewed with patient? No - on eval did discuss role of PT, PT POC  SHORT TERM GOALS: Target date: 07/20/2023   Pt will demonstrate appropriate understanding and performance  of initially prescribed HEP in order to facilitate improved independence with management of symptoms.  Baseline: HEP provided on eval Goal status: INITIAL   2. Pt will score greater than or equal to NT on FOTO in order to demonstrate improved perception of function due to symptoms.  Baseline: NT   Goal status: INITIAL    LONG TERM GOALS: Target date:08/17/2023   Pt will score NT on FOTO in order to demonstrate improved perception of functional status due to symptoms.  Baseline: NT  Goal status: INITIAL  2.  Pt will be able to perform sit to stand w/ safe mechanics and LRAD assistive device in order to facilitate improved safety w/ transfers. Baseline: needs min A  Goal status: INITIAL  3.  Pt will be able to ambulate 150 feet mod I  LRAD in order to facilitate improved safety w/ household navigation. Baseline: 100 feet min A quad cane  Goal status: INITIAL  4. Pt will demonstrate appropriate performance of final prescribed HEP in order to facilitate improved self-management of symptoms post-discharge.   Baseline: initial HEP prescribed  Goal status: INITIAL    5. Pt will demonstrate 5 x STS x 15 sec in order to facilitate improved functional strength.  Baseline: 28 sec   Goal status: INITIAL   PLAN:  PT FREQUENCY: 1-2x/week  PT DURATION: 6 weeks  PLANNED INTERVENTIONS: Therapeutic exercises, Therapeutic activity, Neuromuscular re-education, Balance training, Gait training, Patient/Family education, Self Care, Joint mobilization, DME instructions, Aquatic Therapy, and Re-evaluation.  PLAN FOR NEXT SESSION=Or another objective survey . Review/update HEP PRN. Work on [employer] exercises as appropriate with emphasis on balance, ankle ROM general strength . Symptom modification strategies as indicated/appropriate.     Karie Mainland, PT 06/22/23 11:11 AM Phone: [phone] Fax: [phone]

## 2023-06-25 DIAGNOSIS — N390 Urinary tract infection, site not specified: Secondary | ICD-10-CM | POA: Diagnosis not present

## 2023-06-28 DIAGNOSIS — E559 Vitamin D deficiency, unspecified: Secondary | ICD-10-CM | POA: Diagnosis not present

## 2023-06-28 DIAGNOSIS — I10 Essential (primary) hypertension: Secondary | ICD-10-CM | POA: Diagnosis not present

## 2023-06-28 DIAGNOSIS — N390 Urinary tract infection, site not specified: Secondary | ICD-10-CM | POA: Diagnosis not present

## 2023-06-28 DIAGNOSIS — Z23 Encounter for immunization: Secondary | ICD-10-CM | POA: Diagnosis not present

## 2023-06-28 DIAGNOSIS — I69354 Hemiplegia and hemiparesis following cerebral infarction affecting left non-dominant side: Secondary | ICD-10-CM | POA: Diagnosis not present

## 2023-06-28 DIAGNOSIS — J449 Chronic obstructive pulmonary disease, unspecified: Secondary | ICD-10-CM | POA: Diagnosis not present

## 2023-06-28 DIAGNOSIS — Z0000 Encounter for general adult medical examination without abnormal findings: Secondary | ICD-10-CM | POA: Diagnosis not present

## 2023-06-28 DIAGNOSIS — N184 Chronic kidney disease, stage 4 (severe): Secondary | ICD-10-CM | POA: Diagnosis not present

## 2023-06-28 DIAGNOSIS — E7800 Pure hypercholesterolemia, unspecified: Secondary | ICD-10-CM | POA: Diagnosis not present

## 2023-06-30 ENCOUNTER — Ambulatory Visit: Payer: Medicare Other | Admitting: Physical Therapy

## 2023-07-01 DIAGNOSIS — Z85828 Personal history of other malignant neoplasm of skin: Secondary | ICD-10-CM | POA: Diagnosis not present

## 2023-07-01 DIAGNOSIS — L570 Actinic keratosis: Secondary | ICD-10-CM | POA: Diagnosis not present

## 2023-07-01 DIAGNOSIS — D0472 Carcinoma in situ of skin of left lower limb, including hip: Secondary | ICD-10-CM | POA: Diagnosis not present

## 2023-07-01 DIAGNOSIS — D23122 Other benign neoplasm of skin of left lower eyelid, including canthus: Secondary | ICD-10-CM | POA: Diagnosis not present

## 2023-07-06 ENCOUNTER — Ambulatory Visit: Payer: Medicare Other | Attending: Orthopaedic Surgery | Admitting: Physical Therapy

## 2023-07-06 ENCOUNTER — Encounter: Payer: Self-pay | Admitting: Physical Therapy

## 2023-07-06 DIAGNOSIS — R293 Abnormal posture: Secondary | ICD-10-CM | POA: Insufficient documentation

## 2023-07-06 DIAGNOSIS — R2681 Unsteadiness on feet: Secondary | ICD-10-CM | POA: Diagnosis not present

## 2023-07-06 DIAGNOSIS — M6281 Muscle weakness (generalized): Secondary | ICD-10-CM | POA: Diagnosis not present

## 2023-07-06 DIAGNOSIS — R262 Difficulty in walking, not elsewhere classified: Secondary | ICD-10-CM | POA: Diagnosis not present

## 2023-07-06 NOTE — Therapy (Signed)
OUTPATIENT PHYSICAL THERAPY TREATMENT   Patient Name: MUREL STALLONE MRN: [id number] DOB:[birth year], 89 y.o., female Today's Date: 07/06/2023  END OF SESSION:  PT End of Session - 07/06/23 1617     Visit Number 3    Number of Visits 17    Date for PT Re-Evaluation 08/17/23    Authorization Type [employer] MCR    PT Start Time 1617    PT Stop Time 1700    PT Time Calculation (min) 43 min    Activity Tolerance Patient tolerated treatment well    Behavior During Therapy WFL for tasks assessed/performed              Past Medical History:  Diagnosis Date   CKD (chronic kidney disease), stage III (HCC)    Family history of breast cancer    mother   Family history of colon cancer    father   Glaucoma    Osteopenia    SBO (small bowel obstruction) (HCC)    Past Surgical History:  Procedure Laterality Date   APPENDECTOMY     CESAREAN SECTION     x6   CORONARY STENT INTERVENTION N/A 06/08/2022   Procedure: CORONARY STENT INTERVENTION;  Surgeon: Lennette Bihari, MD;  Location: [facility];  Service: Cardiovascular;  Laterality: N/A;   ESOPHAGOGASTRODUODENOSCOPY  07/07/2012   Procedure: ESOPHAGOGASTRODUODENOSCOPY (EGD);  Surgeon: Willis Modena, MD;  Location: [facility] ENDOSCOPY;  Service: Endoscopy;  Laterality: N/A;   GLAUCOMA SURGERY Bilateral 02/17/2022   HIP SURGERY     2 rods were put on left hip   LEFT COLECTOMY  04/30/2011   Sr Streck   LEFT HEART CATH AND CORONARY ANGIOGRAPHY N/A 06/08/2022   Procedure: LEFT HEART CATH AND CORONARY ANGIOGRAPHY;  Surgeon: Lennette Bihari, MD;  Location: [facility];  Service: Cardiovascular;  Laterality: N/A;   tubal cyst  04/30/2011   L fallopian tube cyst incidentally found   Patient Active Problem List   Diagnosis Date Noted   Non-ST elevation (NSTEMI) myocardial infarction (HCC) 06/06/2022   Elevated troponin 06/05/2022   Hiatal hernia 06/05/2022   Elevated d-dimer 06/05/2022   Abnormal urinalysis 06/05/2022   HLD  (hyperlipidemia) 06/05/2022   History of stroke 06/05/2022   Chronic kidney disease, stage 3b (HCC) 06/05/2022   Uncontrolled hypertension 12/30/2021   Glaucoma 12/29/2021   Acute ischemic stroke (HCC) 12/28/2021   Chronic pain of right knee 01/15/2020   Unilateral primary osteoarthritis, left knee 01/25/2018   Unilateral primary osteoarthritis, right knee 01/25/2018   Chronic pain of both knees 01/25/2018   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HCC) 12/22/2010    PCP: Daisy Floro, MD  REFERRING PROVIDER: Huel Cote, MD   REFERRING DIAG: W19.XXXD (ICD-10-CM) - Fall, subsequent encounter (Referral comment: "Gait training  Aquatic therapy")  Rationale for Evaluation and Treatment: Rehabilitation  THERAPY DIAG:  Unsteady gait  ONSET DATE: 10/2022  SUBJECTIVE:  SUBJECTIVE STATEMENT: Pt accompanied by son, Joey. Denies any significant changes since last visit. Has had limited HEP performance. Continues to endorse some hip pain and exertional SOB that limits functional mobility. Her son mentions that pt tends to minimize symptoms. Arrives w/ QC but is assisted into gym w/ HHA + minA.    PERTINENT HISTORY:  CKD, osteopenia, SBO, hx MI, HTN, hx CVA June 2023  L hip ORIF medullary nailing s/p fall DC home health 10/2021.   PAIN:  Are you having pain: no resting , moderate pain while standing Location/description: NA Best-worst over past week: NA   - aggravating factors: NA  - Easing factors: NA     PRECAUTIONS: Right eye blindness, left eye glaucoma with impairment, cardiac hx, fall risk, osteopenia  WEIGHT BEARING RESTRICTIONS: No  FALLS:  Has patient fallen in last 6 months? Yes. Number of falls 1  LIVING ENVIRONMENT: Lives with: lives with their son Lives in: House/apartment Stairs:  Yes: External: 2 steps; on right going up Has following equipment at home: Quad cane large base, Environmental consultant - 4 wheeled, and Wheelchair (manual)  OCCUPATION: Not  PLOF: Requires assistive device for independence, Needs assistance with ADLs, Needs assistance with homemaking, Needs assistance with gait, and Leisure: reads the newspaper, light cleaning, plants  Daughter reports the patient is independent with ambulation in her home using a quad cane but she is not without 24-hour supervision.   PATIENT GOALS: I want to be able to walk better and move like I used to be  NEXT MD VISIT: Multiple visits as needed  OBJECTIVE:   DIAGNOSTIC FINDINGS:     MRI brain (without) demonstrating: - Moderate atrophy and moderate chronic small vessel ischemic disease. - Few chronic cerebral microhemorrhages. - No acute findings. No change from 12/29/21.    COMPARISON:  01/29/2023   FINDINGS: Stable hardware is noted in the proximal left femur. No acute fracture or dislocation is noted. No soft tissue changes are seen. Significant degenerative changes about the left knee joint are noted.   IMPRESSION: Postsurgical changes in the left hip. Degenerative changes in the left knee. The overall appearance is stable from the prior exam.        SURVEYS:  NT on eval   SCREENING FOR RED FLAGS: Red flag questioning/screening reassuring NEG   COGNITION: Overall cognitive status: History of cognitive impairments - at baseline     SENSATION: WFL  POSTURE: Posterior leanrounded shoulders, forward head, increased thoracic kyphosis, flexed trunk , and posterior lean  PALPATION: N/A   LOWER EXTREMITY ROM:     Active  Right eval Left eval Rt./Lt.  06/22/23  Hip flexion     Hip extension     Ankle DF  Lacks 8 deg  Lacks 8 deg  Approx 10 deg bilateral   Hip external rotation     Knee extension   -15  Knee flexion   105  (Blank rows = not tested) (Key: WFL = within functional limits not formally  assessed, * = concordant pain, s = stiffness/stretching sensation, NT = not tested)  Comments:    LOWER EXTREMITY MMT:    MMT Right eval Left eval Rt./lt. 06/22/23  Hip flexion 4 4 4   Hip abduction (modified sitting)     Hip internal rotation      Hip external rotation     Knee flexion 4+ 4+ 4+  Knee extension 4+ 4+ 4+  Ankle dorsiflexion   3-/5   (Blank rows = not tested) (Key:  WFL = within functional limits not formally assessed, * = concordant pain, s = stiffness/stretching sensation, NT = not tested)  Comments:     FUNCTIONAL TESTS:  5 times sit to stand: 28 sec  with min A and hands on the chair 2 minute walk test: 100 feet    06/22/23 2 min walk test 137 feet with rolling walker, min A  GAIT: Distance walked: 100  Assistive device utilized: Quad cane large base Level of assistance: Min A Comments: Posterior lean maximal cueing required to use the cane firmly on the ground, improved step length and heel strike  TODAY'S TREATMENT:      [facility] Adult PT Treatment:                                                DATE: 07/06/23 Therapeutic Exercise: LAQ x10 BIL (began w/ sheet assist for full ROM but deferred after first couple reps) Seated heel raises x10 ML weight shift w/ B UE support on walker CGA x8 each limb, CGA vs minA to improve mechanics/pacing AP staggered stance weight shift  2x5 CGA-minA to assist w/ mechanics/pacing Slow standing marches w/ UE support on walker x5 Seated marches 2x5 BIL HEP handout + education   [facility] Adult PT Treatment:                                                DATE: 06/22/23 Therapeutic Exercise: Supine bridge x 15  SLR  x 10  Hamstring, calf stretch with sheet  Hip abd/ER green band x 15  Therapeutic Activity: 2 min walk 137 feet with rolling walker , min A  no increased pain  Self Care: Start on land   Transition to DB when able to schedule.                                                                                                                             [facility] Adult PT Treatment:                                                DATE: 04/22/23 PT evaluation completed assessing safety with gait and transfers, posture, balance reactions.     PATIENT EDUCATION:  Education details: rationale for interventions, HEP  Person educated: Patient Education method: Explanation, Demonstration, Tactile cues, Verbal cues Education comprehension: verbalized understanding, returned demonstration, verbal cues required, tactile cues required, and needs further education     HOME EXERCISE PROGRAM: Access Code: [email or web address] URL: [email or web address] Date: 07/06/2023 Prepared by: Fransisco Hertz  Exercises - Seated Hip  Abduction  - 2-3 x daily - 1 sets - 8 reps - Seated Heel Toe Raises  - 2-3 x daily - 1 sets - 8 reps - Seated March  - 2-3 x daily - 1 sets - 5 reps  ASSESSMENT:  CLINICAL IMPRESSION:  07/06/2023 Pt arrives w/ report of 2/10 hip pain on average, although her son mentions she does tend to minimize symptoms. Today focusing on seated exercises for hip/knee/ankle to mitigate stiffness and work on active mobility within tolerated ROM. Also working on standing activities with CGA and B UE support to work on LE endurance/mechanics. Does better with tactile and verbal cues given visual impairments. Rest breaks throughout to mitigate fatigue/pain although pt endorses good tolerance throughout and denies any increases in pain. Min A + HHA for ambulation in clinic, assisting pt to car at end of session via wheelchair as her son states that is her norm for doctor's visits. No adverse events. HEP update as above. Recommend continuing along current POC in order to address relevant deficits and improve functional tolerance. Pt departs today's session in no acute distress, all voiced questions/concerns addressed appropriately from PT perspective.       OBJECTIVE IMPAIRMENTS: Abnormal gait, decreased activity tolerance,  decreased balance, decreased cognition, decreased coordination, decreased endurance, decreased knowledge of condition, decreased knowledge of use of DME, decreased mobility, difficulty walking, decreased ROM, decreased strength, decreased safety awareness, increased edema, increased fascial restrictions, impaired perceived functional ability, impaired flexibility, impaired vision/preception, improper body mechanics, and postural dysfunction.   ACTIVITY LIMITATIONS: carrying, lifting, bending, standing, squatting, stairs, transfers, bed mobility, and locomotion level  PARTICIPATION LIMITATIONS: meal prep, cleaning, shopping, and community activity  PERSONAL FACTORS: Age, Time since onset of injury/illness/exacerbation, and 3+ comorbidities: visual deficits, osteopenia, cognition  are also affecting patient's functional outcome.   REHAB POTENTIAL: Fair given severity of deficits, comorbidities  CLINICAL DECISION MAKING: Evolving/moderate complexity  EVALUATION COMPLEXITY: Moderate   GOALS: Goals reviewed with patient? No - on eval did discuss role of PT, PT POC  SHORT TERM GOALS: Target date: 07/20/2023   Pt will demonstrate appropriate understanding and performance of initially prescribed HEP in order to facilitate improved independence with management of symptoms.  Baseline: HEP provided on eval Goal status: INITIAL   2. Pt will score greater than or equal to NT on FOTO in order to demonstrate improved perception of function due to symptoms.  Baseline: NT   Goal status: INITIAL    LONG TERM GOALS: Target date:08/17/2023   Pt will score NT on FOTO in order to demonstrate improved perception of functional status due to symptoms.  Baseline: NT  Goal status: INITIAL  2.  Pt will be able to perform sit to stand w/ safe mechanics and LRAD assistive device in order to facilitate improved safety w/ transfers. Baseline: needs min A  Goal status: INITIAL  3.  Pt will be able to ambulate  150 feet mod I  LRAD in order to facilitate improved safety w/ household navigation. Baseline: 100 feet min A quad cane  Goal status: INITIAL  4. Pt will demonstrate appropriate performance of final prescribed HEP in order to facilitate improved self-management of symptoms post-discharge.   Baseline: initial HEP prescribed  Goal status: INITIAL    5. Pt will demonstrate 5 x STS x 15 sec in order to facilitate improved functional strength.  Baseline: 28 sec   Goal status: INITIAL   PLAN:  PT FREQUENCY: 1-2x/week  PT DURATION: 6 weeks  PLANNED INTERVENTIONS: Therapeutic  exercises, Therapeutic activity, Neuromuscular re-education, Balance training, Gait training, Patient/Family education, Self Care, Joint mobilization, DME instructions, Aquatic Therapy, and Re-evaluation.  PLAN FOR NEXT SESSION: Review/update HEP PRN. Work on [employer] exercises as appropriate with emphasis on balance, ankle ROM general strength . Symptom modification strategies as indicated/appropriate.     Ashley Murrain PT, DPT 07/06/2023 5:13 PM

## 2023-07-07 NOTE — Therapy (Signed)
OUTPATIENT PHYSICAL THERAPY TREATMENT   Patient Name: Laurie [name] MRN: [id number] DOB:October 20, 1931, 89 y.o., female Today's Date: 07/08/2023  END OF SESSION:  PT End of Session - 07/08/23 1322     Visit Number 4    Number of Visits 17    Date for PT Re-Evaluation 08/17/23    Authorization Type [employer] MCR    Authorization Time Period auth tbd - Submitted for 12 visits starting 07/06/23    PT Start Time 1322   late check in   PT Stop Time 1358    PT Time Calculation (min) 36 min    Activity Tolerance Patient tolerated treatment well    Behavior During Therapy WFL for tasks assessed/performed               Past Medical History:  Diagnosis Date   CKD (chronic kidney disease), stage III (HCC)    Family history of breast cancer    mother   Family history of colon cancer    father   Glaucoma    Osteopenia    SBO (small bowel obstruction) (HCC)    Past Surgical History:  Procedure Laterality Date   APPENDECTOMY     CESAREAN SECTION     x6   CORONARY STENT INTERVENTION N/A 06/08/2022   Procedure: CORONARY STENT INTERVENTION;  Surgeon: Lennette Bihari, MD;  Location: [facility];  Service: Cardiovascular;  Laterality: N/A;   ESOPHAGOGASTRODUODENOSCOPY  07/07/2012   Procedure: ESOPHAGOGASTRODUODENOSCOPY (EGD);  Surgeon: Willis Modena, MD;  Location: [facility] ENDOSCOPY;  Service: Endoscopy;  Laterality: N/A;   GLAUCOMA SURGERY Bilateral 02/17/2022   HIP SURGERY     2 rods were put on left hip   LEFT COLECTOMY  04/30/2011   Sr [facility]reck   LEFT HEART CATH AND CORONARY ANGIOGRAPHY N/A 06/08/2022   Procedure: LEFT HEART CATH AND CORONARY ANGIOGRAPHY;  Surgeon: Lennette Bihari, MD;  Location: [facility];  Service: Cardiovascular;  Laterality: N/A;   tubal cyst  04/30/2011   L fallopian tube cyst incidentally found   Patient Active Problem List   Diagnosis Date Noted   Non-ST elevation (NSTEMI) myocardial infarction (HCC) 06/06/2022   Elevated troponin 06/05/2022    Hiatal hernia 06/05/2022   Elevated d-dimer 06/05/2022   Abnormal urinalysis 06/05/2022   HLD (hyperlipidemia) 06/05/2022   History of stroke 06/05/2022   Chronic kidney disease, stage 3b (HCC) 06/05/2022   Uncontrolled hypertension 12/30/2021   Glaucoma 12/29/2021   Acute ischemic stroke (HCC) 12/28/2021   Chronic pain of right knee 01/15/2020   Unilateral primary osteoarthritis, left knee 01/25/2018   Unilateral primary osteoarthritis, right knee 01/25/2018   Chronic pain of both knees 01/25/2018   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HCC) 12/22/2010    PCP: Daisy Floro, MD  REFERRING PROVIDER: Huel Cote, MD   REFERRING DIAG: W19.XXXD (ICD-10-CM) - Fall, subsequent encounter (Referral comment: "Gait training  Aquatic therapy")  Rationale for Evaluation and Treatment: Rehabilitation  THERAPY DIAG:  Unsteady gait  ONSET DATE: 10/2022  SUBJECTIVE:  SUBJECTIVE STATEMENT: No pain at present, no issues after last session but was a bit fatigued afterwards. Was trying to do things around the house yesterday and states hip was bothering her a bit, but feeling good today. Accompanied by her daughter dezaray.      PERTINENT HISTORY:  CKD, osteopenia, SBO, hx MI, HTN, hx CVA June 2023  L hip ORIF medullary nailing s/p fall DC home health 10/2021.   PAIN:  Are you having pain: no resting , moderate pain while standing Location/description: NA Best-worst over past week: NA   - aggravating factors: NA  - Easing factors: NA     PRECAUTIONS: Right eye blindness, left eye glaucoma with impairment, cardiac hx, fall risk, osteopenia  WEIGHT BEARING RESTRICTIONS: No  FALLS:  Has patient fallen in last 6 months? Yes. Number of falls 1  LIVING ENVIRONMENT: Lives with: lives with their  son Lives in: House/apartment Stairs: Yes: External: 2 steps; on right going up Has following equipment at home: Quad cane large base, Environmental consultant - 4 wheeled, and Wheelchair (manual)  OCCUPATION: Not  PLOF: Requires assistive device for independence, Needs assistance with ADLs, Needs assistance with homemaking, Needs assistance with gait, and Leisure: reads the newspaper, light cleaning, plants  Daughter reports the patient is independent with ambulation in her home using a quad cane but she is not without 24-hour supervision.   PATIENT GOALS: I want to be able to walk better and move like I used to be  NEXT MD VISIT: Multiple visits as needed  OBJECTIVE:   DIAGNOSTIC FINDINGS:     MRI brain (without) demonstrating: - Moderate atrophy and moderate chronic small vessel ischemic disease. - Few chronic cerebral microhemorrhages. - No acute findings. No change from 12/29/21.    COMPARISON:  01/29/2023   FINDINGS: Stable hardware is noted in the proximal left femur. No acute fracture or dislocation is noted. No soft tissue changes are seen. Significant degenerative changes about the left knee joint are noted.   IMPRESSION: Postsurgical changes in the left hip. Degenerative changes in the left knee. The overall appearance is stable from the prior exam.        SURVEYS:  NT on eval   SCREENING FOR RED FLAGS: Red flag questioning/screening reassuring NEG   COGNITION: Overall cognitive status: History of cognitive impairments - at baseline     SENSATION: WFL  POSTURE: Posterior leanrounded shoulders, forward head, increased thoracic kyphosis, flexed trunk , and posterior lean  PALPATION: N/A   LOWER EXTREMITY ROM:     Active  Right eval Left eval Rt./Lt.  06/22/23  Hip flexion     Hip extension     Ankle DF  Lacks 8 deg  Lacks 8 deg  Approx 10 deg bilateral   Hip external rotation     Knee extension   -15  Knee flexion   105  (Blank rows = not tested) (Key: WFL =  within functional limits not formally assessed, * = concordant pain, s = stiffness/stretching sensation, NT = not tested)  Comments:    LOWER EXTREMITY MMT:    MMT Right eval Left eval Rt./lt. 06/22/23  Hip flexion 4 4 4   Hip abduction (modified sitting)     Hip internal rotation      Hip external rotation     Knee flexion 4+ 4+ 4+  Knee extension 4+ 4+ 4+  Ankle dorsiflexion   3-/5   (Blank rows = not tested) (Key: WFL = within functional limits not formally assessed, * =  concordant pain, s = stiffness/stretching sensation, NT = not tested)  Comments:     FUNCTIONAL TESTS:  5 times sit to stand: 28 sec  with min A and hands on the chair 2 minute walk test: 100 feet    06/22/23 2 min walk test 137 feet with rolling walker, min A  GAIT: Distance walked: 100  Assistive device utilized: Quad cane large base Level of assistance: Min A Comments: Posterior lean maximal cueing required to use the cane firmly on the ground, improved step length and heel strike  TODAY'S TREATMENT:      [facility] Adult PT Treatment:                                                DATE: 07/08/23 Therapeutic Exercise: LAQ x10 BIL cues for pacing R seated quad set w/ heel prop on stool 2x8 tactile cues Seated heel raises x20 cues for form and pacing ML weight shifts x10 w RW  Staggered stance AP weight shift w RW x10 BIL Seated abduction AROM x8 BIL cues for comfortable ROM and posture Verbal HEP review/education  Therapeutic Activity: Assist with car transfer HHA, tactile/verbal cues for sequencing, safety, and mechanics   [facility] Adult PT Treatment:                                                DATE: 07/06/23 Therapeutic Exercise: LAQ x10 BIL (began w/ sheet assist for full ROM but deferred after first couple reps) Seated heel raises x10 ML weight shift w/ B UE support on walker CGA x8 each limb, CGA vs minA to improve mechanics/pacing AP staggered stance weight shift  2x5 CGA-minA to assist w/  mechanics/pacing Slow standing marches w/ UE support on walker x5 Seated marches 2x5 BIL HEP handout + education   [facility] Adult PT Treatment:                                                DATE: 06/22/23 Therapeutic Exercise: Supine bridge x 15  SLR  x 10  Hamstring, calf stretch with sheet  Hip abd/ER green band x 15  Therapeutic Activity: 2 min walk 137 feet with rolling walker , min A  no increased pain  Self Care: Start on land   Transition to DB when able to schedule.     PATIENT EDUCATION:  Education details: rationale for interventions, HEP  Person educated: Patient Education method: Explanation, Demonstration, Tactile cues, Verbal cues Education comprehension: verbalized understanding, returned demonstration, verbal cues required, tactile cues required, and needs further education     HOME EXERCISE PROGRAM: Access Code: [email or web address] URL: [email or web address] Date: 07/06/2023 Prepared by: Fransisco Hertz  Exercises - Seated Hip Abduction  - 2-3 x daily - 1 sets - 8 reps - Seated Heel Toe Raises  - 2-3 x daily - 1 sets - 8 reps - Seated March  - 2-3 x daily - 1 sets - 5 reps  ASSESSMENT:  CLINICAL IMPRESSION:  07/08/2023 Pt arrives w/o pain, reports some fatigue after last session but no increase in pain. Today pt tolerates session quite  well with reduced rest breaks and no pain during activity, pt denies any pain with WB during today's session. Does continue to require rest breaks particularly with standing activities. Did encourage reaching out to office upon arrival so we can assist with wheelchair into/out of clinic for improved energy conservation and activity tolerance within session, assisted back to car with wheelchair again today. No adverse events, reports improved tolerance compared to last session and no pain on departure. Recommend continuing along current POC in order to address relevant deficits and improve functional tolerance. Pt departs today's  session in no acute distress, all voiced questions/concerns addressed appropriately from PT perspective.      OBJECTIVE IMPAIRMENTS: Abnormal gait, decreased activity tolerance, decreased balance, decreased cognition, decreased coordination, decreased endurance, decreased knowledge of condition, decreased knowledge of use of DME, decreased mobility, difficulty walking, decreased ROM, decreased strength, decreased safety awareness, increased edema, increased fascial restrictions, impaired perceived functional ability, impaired flexibility, impaired vision/preception, improper body mechanics, and postural dysfunction.   ACTIVITY LIMITATIONS: carrying, lifting, bending, standing, squatting, stairs, transfers, bed mobility, and locomotion level  PARTICIPATION LIMITATIONS: meal prep, cleaning, shopping, and community activity  PERSONAL FACTORS: Age, Time since onset of injury/illness/exacerbation, and 3+ comorbidities: visual deficits, osteopenia, cognition  are also affecting patient's functional outcome.   REHAB POTENTIAL: Fair given severity of deficits, comorbidities  CLINICAL DECISION MAKING: Evolving/moderate complexity  EVALUATION COMPLEXITY: Moderate   GOALS: Goals reviewed with patient? No - on eval did discuss role of PT, PT POC  SHORT TERM GOALS: Target date: 07/20/2023   Pt will demonstrate appropriate understanding and performance of initially prescribed HEP in order to facilitate improved independence with management of symptoms.  Baseline: HEP provided on eval Goal status: INITIAL   2. Pt will score greater than or equal to NT on FOTO in order to demonstrate improved perception of function due to symptoms.  Baseline: NT   Goal status: INITIAL    LONG TERM GOALS: Target date:08/17/2023   Pt will score NT on FOTO in order to demonstrate improved perception of functional status due to symptoms.  Baseline: NT  Goal status: INITIAL  2.  Pt will be able to perform sit to  stand w/ safe mechanics and LRAD assistive device in order to facilitate improved safety w/ transfers. Baseline: needs min A  Goal status: INITIAL  3.  Pt will be able to ambulate 150 feet mod I  LRAD in order to facilitate improved safety w/ household navigation. Baseline: 100 feet min A quad cane  Goal status: INITIAL  4. Pt will demonstrate appropriate performance of final prescribed HEP in order to facilitate improved self-management of symptoms post-discharge.   Baseline: initial HEP prescribed  Goal status: INITIAL    5. Pt will demonstrate 5 x STS x 15 sec in order to facilitate improved functional strength.  Baseline: 28 sec   Goal status: INITIAL   PLAN:  PT FREQUENCY: 1-2x/week  PT DURATION: 6 weeks  PLANNED INTERVENTIONS: Therapeutic exercises, Therapeutic activity, Neuromuscular re-education, Balance training, Gait training, Patient/Family education, Self Care, Joint mobilization, DME instructions, Aquatic Therapy, and Re-evaluation.  PLAN FOR NEXT SESSION: Review/update HEP PRN. Work on [employer] exercises as appropriate with emphasis on balance, ankle ROM general strength . Symptom modification strategies as indicated/appropriate.     Ashley Murrain PT, DPT 07/08/2023 3:35 PM

## 2023-07-08 ENCOUNTER — Encounter: Payer: Self-pay | Admitting: Physical Therapy

## 2023-07-08 ENCOUNTER — Ambulatory Visit: Payer: Medicare Other | Admitting: Physical Therapy

## 2023-07-08 DIAGNOSIS — R2681 Unsteadiness on feet: Secondary | ICD-10-CM

## 2023-07-08 DIAGNOSIS — M6281 Muscle weakness (generalized): Secondary | ICD-10-CM | POA: Diagnosis not present

## 2023-07-08 DIAGNOSIS — R262 Difficulty in walking, not elsewhere classified: Secondary | ICD-10-CM | POA: Diagnosis not present

## 2023-07-08 DIAGNOSIS — R293 Abnormal posture: Secondary | ICD-10-CM | POA: Diagnosis not present

## 2023-07-12 NOTE — Therapy (Signed)
OUTPATIENT PHYSICAL THERAPY TREATMENT   Patient Name: Laurie [name] MRN: [id number] DOB:1931/09/18, 89 y.o., female Today's Date: 07/13/2023  END OF SESSION:  PT End of Session - 07/13/23 1147     Visit Number 5    Number of Visits 17    Date for PT Re-Evaluation 08/17/23    Authorization Type [employer] MCR    Authorization Time Period auth tbd - Submitted for 12 visits starting 07/06/23    PT Start Time 1147    PT Stop Time 1230    PT Time Calculation (min) 43 min    Activity Tolerance Patient tolerated treatment well                Past Medical History:  Diagnosis Date   CKD (chronic kidney disease), stage III (HCC)    Family history of breast cancer    mother   Family history of colon cancer    father   Glaucoma    Osteopenia    SBO (small bowel obstruction) (HCC)    Past Surgical History:  Procedure Laterality Date   APPENDECTOMY     CESAREAN SECTION     x6   CORONARY STENT INTERVENTION N/A 06/08/2022   Procedure: CORONARY STENT INTERVENTION;  Surgeon: Lennette Bihari, MD;  Location: [facility];  Service: Cardiovascular;  Laterality: N/A;   ESOPHAGOGASTRODUODENOSCOPY  07/07/2012   Procedure: ESOPHAGOGASTRODUODENOSCOPY (EGD);  Surgeon: Willis Modena, MD;  Location: [facility];  Service: Endoscopy;  Laterality: N/A;   GLAUCOMA SURGERY Bilateral 02/17/2022   HIP SURGERY     2 rods were put on left hip   LEFT COLECTOMY  04/30/2011   Sr Streck   LEFT HEART CATH AND CORONARY ANGIOGRAPHY N/A 06/08/2022   Procedure: LEFT HEART CATH AND CORONARY ANGIOGRAPHY;  Surgeon: Lennette Bihari, MD;  Location: [facility];  Service: Cardiovascular;  Laterality: N/A;   tubal cyst  04/30/2011   L fallopian tube cyst incidentally found   Patient Active Problem List   Diagnosis Date Noted   Non-ST elevation (NSTEMI) myocardial infarction (HCC) 06/06/2022   Elevated troponin 06/05/2022   Hiatal hernia 06/05/2022   Elevated d-dimer 06/05/2022   Abnormal  urinalysis 06/05/2022   HLD (hyperlipidemia) 06/05/2022   History of stroke 06/05/2022   Chronic kidney disease, stage 3b (HCC) 06/05/2022   Uncontrolled hypertension 12/30/2021   Glaucoma 12/29/2021   Acute ischemic stroke (HCC) 12/28/2021   Chronic pain of right knee 01/15/2020   Unilateral primary osteoarthritis, left knee 01/25/2018   Unilateral primary osteoarthritis, right knee 01/25/2018   Chronic pain of both knees 01/25/2018   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HCC) 12/22/2010    PCP: Daisy Floro, MD  REFERRING PROVIDER: Huel Cote, MD   REFERRING DIAG: W19.XXXD (ICD-10-CM) - Fall, subsequent encounter (Referral comment: "Gait training  Aquatic therapy")  Rationale for Evaluation and Treatment: Rehabilitation  THERAPY DIAG:  Unsteady gait  ONSET DATE: 10/2022  SUBJECTIVE:  SUBJECTIVE STATEMENT: Pt denies any pain at present, states she felt "very good" after last session but does endorse some fatigue. Accompanied by her son. Denies any other new updates     PERTINENT HISTORY:  CKD, osteopenia, SBO, hx MI, HTN, hx CVA June 2023  L hip ORIF medullary nailing s/p fall DC home health 10/2021.   PAIN:  Are you having pain: no resting , moderate pain while standing Location/description: NA Best-worst over past week: NA   - aggravating factors: NA  - Easing factors: NA     PRECAUTIONS: Right eye blindness, left eye glaucoma with impairment, cardiac hx, fall risk, osteopenia  WEIGHT BEARING RESTRICTIONS: No  FALLS:  Has patient fallen in last 6 months? Yes. Number of falls 1  LIVING ENVIRONMENT: Lives with: lives with their son Lives in: House/apartment Stairs: Yes: External: 2 steps; on right going up Has following equipment at home: Quad cane large base, Environmental consultant - 4  wheeled, and Wheelchair (manual)  OCCUPATION: Not  PLOF: Requires assistive device for independence, Needs assistance with ADLs, Needs assistance with homemaking, Needs assistance with gait, and Leisure: reads the newspaper, light cleaning, plants  Daughter reports the patient is independent with ambulation in her home using a quad cane but she is not without 24-hour supervision.   PATIENT GOALS: I want to be able to walk better and move like I used to be  NEXT MD VISIT: Multiple visits as needed  OBJECTIVE:   DIAGNOSTIC FINDINGS:     MRI brain (without) demonstrating: - Moderate atrophy and moderate chronic small vessel ischemic disease. - Few chronic cerebral microhemorrhages. - No acute findings. No change from 12/29/21.    COMPARISON:  01/29/2023   FINDINGS: Stable hardware is noted in the proximal left femur. No acute fracture or dislocation is noted. No soft tissue changes are seen. Significant degenerative changes about the left knee joint are noted.   IMPRESSION: Postsurgical changes in the left hip. Degenerative changes in the left knee. The overall appearance is stable from the prior exam.        SURVEYS:  NT on eval   SCREENING FOR RED FLAGS: Red flag questioning/screening reassuring NEG   COGNITION: Overall cognitive status: History of cognitive impairments - at baseline     SENSATION: WFL  POSTURE: Posterior leanrounded shoulders, forward head, increased thoracic kyphosis, flexed trunk , and posterior lean  PALPATION: N/A   LOWER EXTREMITY ROM:     Active  Right eval Left eval Rt./Lt.  06/22/23  Hip flexion     Hip extension     Ankle DF  Lacks 8 deg  Lacks 8 deg  Approx 10 deg bilateral   Hip external rotation     Knee extension   -15  Knee flexion   105  (Blank rows = not tested) (Key: WFL = within functional limits not formally assessed, * = concordant pain, s = stiffness/stretching sensation, NT = not tested)  Comments:    LOWER  EXTREMITY MMT:    MMT Right eval Left eval Rt./lt. 06/22/23  Hip flexion 4 4 4   Hip abduction (modified sitting)     Hip internal rotation      Hip external rotation     Knee flexion 4+ 4+ 4+  Knee extension 4+ 4+ 4+  Ankle dorsiflexion   3-/5   (Blank rows = not tested) (Key: WFL = within functional limits not formally assessed, * = concordant pain, s = stiffness/stretching sensation, NT = not tested)  Comments:  FUNCTIONAL TESTS:  5 times sit to stand: 28 sec  with min A and hands on the chair 2 minute walk test: 100 feet    06/22/23 2 min walk test 137 feet with rolling walker, min A  GAIT: Distance walked: 100  Assistive device utilized: Quad cane large base Level of assistance: Min A Comments: Posterior lean maximal cueing required to use the cane firmly on the ground, improved step length and heel strike  TODAY'S TREATMENT:      [facility] Adult PT Treatment:                                                DATE: 07/13/23 Therapeutic Exercise: Seated red band hip abduction 2x5 cues for pacing and posture, breath control  Seated heel raises x10 cues for improved heel contact on LLE LAQ + DF 2x8 BIL cues for ankle DF HEP update + education/handout, education on caregiver assist for standing activities  Therapeutic Activity: AP weight shifts w/ RW emphasis on foot contact x10 CGA and tactile cues  Fwd step + weight shift 2x5 BIL w/ RW CGA  Lateral step + weight shift x6 BIL w RW CGA STS for each of above sets, does require cueing for UE sequencing and mechanics, tactile cues for improved fwd weight shift    [facility] Adult PT Treatment:                                                DATE: 07/08/23 Therapeutic Exercise: LAQ x10 BIL cues for pacing R seated quad set w/ heel prop on stool 2x8 tactile cues Seated heel raises x20 cues for form and pacing ML weight shifts x10 w RW  Staggered stance AP weight shift w RW x10 BIL Seated abduction AROM x8 BIL cues for comfortable  ROM and posture Verbal HEP review/education  Therapeutic Activity: Assist with car transfer HHA, tactile/verbal cues for sequencing, safety, and mechanics   [facility] Adult PT Treatment:                                                DATE: 07/06/23 Therapeutic Exercise: LAQ x10 BIL (began w/ sheet assist for full ROM but deferred after first couple reps) Seated heel raises x10 ML weight shift w/ B UE support on walker CGA x8 each limb, CGA vs minA to improve mechanics/pacing AP staggered stance weight shift  2x5 CGA-minA to assist w/ mechanics/pacing Slow standing marches w/ UE support on walker x5 Seated marches 2x5 BIL HEP handout + education   [facility] Adult PT Treatment:                                                DATE: 06/22/23 Therapeutic Exercise: Supine bridge x 15  SLR  x 10  Hamstring, calf stretch with sheet  Hip abd/ER green band x 15  Therapeutic Activity: 2 min walk 137 feet with rolling walker , min A  no increased pain  Self Care: Start on land   Transition to DB when able to schedule.     PATIENT EDUCATION:  Education details: rationale for interventions, HEP  Person educated: Patient Education method: Explanation, Demonstration, Tactile cues, Verbal cues Education comprehension: verbalized understanding, returned demonstration, verbal cues required, tactile cues required, and needs further education     HOME EXERCISE PROGRAM: Access Code: [email or web address] URL: [email or web address] Date: 07/13/2023 Prepared by: Fransisco Hertz  Program Notes - with weight shifts, please use counter or rolling walker for upper body support, chair nearby for seated rest. would also recommend caregiver supervision/assist  Exercises - Seated Hip Abduction  - 2-3 x daily - 1 sets - 8 reps - Seated Heel Toe Raises  - 2-3 x daily - 1 sets - 12 reps - Seated March  - 2-3 x daily - 1 sets - 5 reps - Seated Long Arc Quad  - 2-3 x daily - 1 sets - 8 reps - Side to Side Weight  Shift with Counter Support  - 2-3 x daily - 1 sets - 5 reps  ASSESSMENT:  CLINICAL IMPRESSION:  07/13/2023 Pt arrives and denies any pain, no issues after last session but does endorse fatigue after sessions. Today continuing to progress weightbearing activities, incorporation of stepping + weight shift as pt demos reduced step length with ambulation. Reduced WB noted on LLE although pt denies any pain, frequent cues to correct w/ modest improvement. No adverse events, continuing to provide extended rest breaks given report of exertional fatigue and fatigue after sessions. Pt denies any pain throughout. Recommend continuing along current POC in order to address relevant deficits and improve functional tolerance. Pt departs today's session in no acute distress, all voiced questions/concerns addressed appropriately from PT perspective.      OBJECTIVE IMPAIRMENTS: Abnormal gait, decreased activity tolerance, decreased balance, decreased cognition, decreased coordination, decreased endurance, decreased knowledge of condition, decreased knowledge of use of DME, decreased mobility, difficulty walking, decreased ROM, decreased strength, decreased safety awareness, increased edema, increased fascial restrictions, impaired perceived functional ability, impaired flexibility, impaired vision/preception, improper body mechanics, and postural dysfunction.   ACTIVITY LIMITATIONS: carrying, lifting, bending, standing, squatting, stairs, transfers, bed mobility, and locomotion level  PARTICIPATION LIMITATIONS: meal prep, cleaning, shopping, and community activity  PERSONAL FACTORS: Age, Time since onset of injury/illness/exacerbation, and 3+ comorbidities: visual deficits, osteopenia, cognition  are also affecting patient's functional outcome.   REHAB POTENTIAL: Fair given severity of deficits, comorbidities  CLINICAL DECISION MAKING: Evolving/moderate complexity  EVALUATION COMPLEXITY:  Moderate   GOALS: Goals reviewed with patient? No - on eval did discuss role of PT, PT POC  SHORT TERM GOALS: Target date: 07/20/2023   Pt will demonstrate appropriate understanding and performance of initially prescribed HEP in order to facilitate improved independence with management of symptoms.  Baseline: HEP provided on eval Goal status: INITIAL   2. Pt will score greater than or equal to NT on FOTO in order to demonstrate improved perception of function due to symptoms.  Baseline: NT   Goal status: INITIAL    LONG TERM GOALS: Target date:08/17/2023   Pt will score NT on FOTO in order to demonstrate improved perception of functional status due to symptoms.  Baseline: NT  Goal status: INITIAL  2.  Pt will be able to perform sit to stand w/ safe mechanics and LRAD assistive device in order to facilitate improved safety w/ transfers. Baseline: needs min A  Goal status: INITIAL  3.  Pt will be able to ambulate 150 feet  mod I  LRAD in order to facilitate improved safety w/ household navigation. Baseline: 100 feet min A quad cane  Goal status: INITIAL  4. Pt will demonstrate appropriate performance of final prescribed HEP in order to facilitate improved self-management of symptoms post-discharge.   Baseline: initial HEP prescribed  Goal status: INITIAL    5. Pt will demonstrate 5 x STS x 15 sec in order to facilitate improved functional strength.  Baseline: 28 sec   Goal status: INITIAL   PLAN:  PT FREQUENCY: 1-2x/week  PT DURATION: 6 weeks  PLANNED INTERVENTIONS: Therapeutic exercises, Therapeutic activity, Neuromuscular re-education, Balance training, Gait training, Patient/Family education, Self Care, Joint mobilization, DME instructions, Aquatic Therapy, and Re-evaluation.  PLAN FOR NEXT SESSION: Review/update HEP PRN. Work on [employer] exercises as appropriate with emphasis on balance, ankle ROM general strength . Symptom modification strategies as  indicated/appropriate.     Ashley Murrain PT, DPT 07/13/2023 12:39 PM

## 2023-07-13 ENCOUNTER — Ambulatory Visit: Payer: Medicare Other | Admitting: Physical Therapy

## 2023-07-13 ENCOUNTER — Encounter: Payer: Self-pay | Admitting: Physical Therapy

## 2023-07-13 DIAGNOSIS — R2681 Unsteadiness on feet: Secondary | ICD-10-CM

## 2023-07-13 DIAGNOSIS — R262 Difficulty in walking, not elsewhere classified: Secondary | ICD-10-CM | POA: Diagnosis not present

## 2023-07-13 DIAGNOSIS — R293 Abnormal posture: Secondary | ICD-10-CM | POA: Diagnosis not present

## 2023-07-13 DIAGNOSIS — M6281 Muscle weakness (generalized): Secondary | ICD-10-CM | POA: Diagnosis not present

## 2023-07-14 NOTE — Therapy (Unsigned)
OUTPATIENT PHYSICAL THERAPY TREATMENT   Patient Name: Laurie [name] MRN: [id number] DOB:[birth year], 89 y.o., female Today's Date: 07/15/2023  END OF SESSION:  PT End of Session - 07/15/23 1120     Visit Number 6    Number of Visits 17    Date for PT Re-Evaluation 08/17/23    Authorization Type [employer] MCR    Authorization Time Period auth tbd - Submitted for 12 visits starting 07/06/23    PT Start Time 1115    PT Stop Time 1148    PT Time Calculation (min) 33 min    Equipment Utilized During Treatment Gait belt    Activity Tolerance Patient tolerated treatment well    Behavior During Therapy WFL for tasks assessed/performed                 Past Medical History:  Diagnosis Date   CKD (chronic kidney disease), stage III (HCC)    Family history of breast cancer    mother   Family history of colon cancer    father   Glaucoma    Osteopenia    SBO (small bowel obstruction) (HCC)    Past Surgical History:  Procedure Laterality Date   APPENDECTOMY     CESAREAN SECTION     x6   CORONARY STENT INTERVENTION N/A 06/08/2022   Procedure: CORONARY STENT INTERVENTION;  Surgeon: Lennette Bihari, MD;  Location: [facility];  Service: Cardiovascular;  Laterality: N/A;   ESOPHAGOGASTRODUODENOSCOPY  07/07/2012   Procedure: ESOPHAGOGASTRODUODENOSCOPY (EGD);  Surgeon: Willis Modena, MD;  Location: [facility] ENDOSCOPY;  Service: Endoscopy;  Laterality: N/A;   GLAUCOMA SURGERY Bilateral 02/17/2022   HIP SURGERY     2 rods were put on left hip   LEFT COLECTOMY  04/30/2011   Sr Streck   LEFT HEART CATH AND CORONARY ANGIOGRAPHY N/A 06/08/2022   Procedure: LEFT HEART CATH AND CORONARY ANGIOGRAPHY;  Surgeon: Lennette Bihari, MD;  Location: [facility];  Service: Cardiovascular;  Laterality: N/A;   tubal cyst  04/30/2011   L fallopian tube cyst incidentally found   Patient Active Problem List   Diagnosis Date Noted   Non-ST elevation (NSTEMI) myocardial infarction (HCC)  06/06/2022   Elevated troponin 06/05/2022   Hiatal hernia 06/05/2022   Elevated d-dimer 06/05/2022   Abnormal urinalysis 06/05/2022   HLD (hyperlipidemia) 06/05/2022   History of stroke 06/05/2022   Chronic kidney disease, stage 3b (HCC) 06/05/2022   Uncontrolled hypertension 12/30/2021   Glaucoma 12/29/2021   Acute ischemic stroke (HCC) 12/28/2021   Chronic pain of right knee 01/15/2020   Unilateral primary osteoarthritis, left knee 01/25/2018   Unilateral primary osteoarthritis, right knee 01/25/2018   Chronic pain of both knees 01/25/2018   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HCC) 12/22/2010    PCP: Daisy Floro, MD  REFERRING PROVIDER: Huel Cote, MD   REFERRING DIAG: W19.XXXD (ICD-10-CM) - Fall, subsequent encounter (Referral comment: "Gait training  Aquatic therapy")  Rationale for Evaluation and Treatment: Rehabilitation  THERAPY DIAG:  Unsteady gait  Difficulty in walking, not elsewhere classified  Abnormal posture  Muscle weakness (generalized)  ONSET DATE: 10/2022  SUBJECTIVE:  SUBJECTIVE STATEMENT: No pain .  She reports some knee pain once in awhile. Arr late.      PERTINENT HISTORY:  CKD, osteopenia, SBO, hx MI, HTN, hx CVA June 2023  L hip ORIF medullary nailing s/p fall DC home health 10/2021.   PAIN:  Are you having pain: no resting , moderate pain while standing Location/description: NA Best-worst over past week: NA   - aggravating factors: NA  - Easing factors: NA     PRECAUTIONS: Right eye blindness, left eye glaucoma with impairment, cardiac hx, fall risk, osteopenia  WEIGHT BEARING RESTRICTIONS: No  FALLS:  Has patient fallen in last 6 months? Yes. Number of falls 1  LIVING ENVIRONMENT: Lives with: lives with their son Lives in:  House/apartment Stairs: Yes: External: 2 steps; on right going up Has following equipment at home: Quad cane large base, Environmental consultant - 4 wheeled, and Wheelchair (manual)  OCCUPATION: Not  PLOF: Requires assistive device for independence, Needs assistance with ADLs, Needs assistance with homemaking, Needs assistance with gait, and Leisure: reads the newspaper, light cleaning, plants  Daughter reports the patient is independent with ambulation in her home using a quad cane but she is not without 24-hour supervision.   PATIENT GOALS: I want to be able to walk better and move like I used to be  NEXT MD VISIT: Multiple visits as needed  OBJECTIVE:   DIAGNOSTIC FINDINGS:     MRI brain (without) demonstrating: - Moderate atrophy and moderate chronic small vessel ischemic disease. - Few chronic cerebral microhemorrhages. - No acute findings. No change from 12/29/21.    COMPARISON:  01/29/2023   FINDINGS: Stable hardware is noted in the proximal left femur. No acute fracture or dislocation is noted. No soft tissue changes are seen. Significant degenerative changes about the left knee joint are noted.   IMPRESSION: Postsurgical changes in the left hip. Degenerative changes in the left knee. The overall appearance is stable from the prior exam.        SURVEYS:  NT on eval   SCREENING FOR RED FLAGS: Red flag questioning/screening reassuring NEG   COGNITION: Overall cognitive status: History of cognitive impairments - at baseline     SENSATION: WFL  POSTURE: Posterior leanrounded shoulders, forward head, increased thoracic kyphosis, flexed trunk , and posterior lean  PALPATION: N/A   LOWER EXTREMITY ROM:     Active  Right eval Left eval Rt./Lt.  06/22/23  Hip flexion     Hip extension     Ankle DF  Lacks 8 deg  Lacks 8 deg  Approx 10 deg bilateral   Hip external rotation     Knee extension   -15  Knee flexion   105  (Blank rows = not tested) (Key: WFL = within  functional limits not formally assessed, * = concordant pain, s = stiffness/stretching sensation, NT = not tested)  Comments:    LOWER EXTREMITY MMT:    MMT Right eval Left eval Rt./lt. 06/22/23  Hip flexion 4 4 4   Hip abduction (modified sitting)     Hip internal rotation      Hip external rotation     Knee flexion 4+ 4+ 4+  Knee extension 4+ 4+ 4+  Ankle dorsiflexion   3-/5   (Blank rows = not tested) (Key: WFL = within functional limits not formally assessed, * = concordant pain, s = stiffness/stretching sensation, NT = not tested)  Comments:     FUNCTIONAL TESTS:  5 times sit to stand: 28  sec  with min A and hands on the chair 2 minute walk test: 100 feet    06/22/23 2 min walk test 137 feet with rolling walker, min A  GAIT: Distance walked: 100  Assistive device utilized: Quad cane large base Level of assistance: Min A Comments: Posterior lean maximal cueing required to use the cane firmly on the ground, improved step length and heel strike  TODAY'S TREATMENT:       [facility] Adult PT Treatment:                                                DATE: 07/15/23 Therapeutic Exercise: Seated green band:  clam, march , hamstring curl and LAQ x 15 each  Standing heel raises x 10  Standing high march   x 10  Sidestepping 4 x 10 feet with cues  Hip abduction  x 10  Therapeutic Activity: Retro walking in parallel bars  Gait with RW 40 feet x 2    [facility] Adult PT Treatment:                                                DATE: 07/13/23 Therapeutic Exercise: Seated red band hip abduction 2x5 cues for pacing and posture, breath control  Seated heel raises x10 cues for improved heel contact on LLE LAQ + DF 2x8 BIL cues for ankle DF HEP update + education/handout, education on caregiver assist for standing activities  Therapeutic Activity: AP weight shifts w/ RW emphasis on foot contact x10 CGA and tactile cues  Fwd step + weight shift 2x5 BIL w/ RW CGA  Lateral step + weight  shift x6 BIL w RW CGA STS for each of above sets, does require cueing for UE sequencing and mechanics, tactile cues for improved fwd weight shift    [facility] Adult PT Treatment:                                                DATE: 07/08/23 Therapeutic Exercise: LAQ x10 BIL cues for pacing R seated quad set w/ heel prop on stool 2x8 tactile cues Seated heel raises x20 cues for form and pacing ML weight shifts x10 w RW  Staggered stance AP weight shift w RW x10 BIL Seated abduction AROM x8 BIL cues for comfortable ROM and posture Verbal HEP review/education  Therapeutic Activity: Assist with car transfer HHA, tactile/verbal cues for sequencing, safety, and mechanics   [facility] Adult PT Treatment:                                                DATE: 07/06/23 Therapeutic Exercise: LAQ x10 BIL (began w/ sheet assist for full ROM but deferred after first couple reps) Seated heel raises x10 ML weight shift w/ B UE support on walker CGA x8 each limb, CGA vs minA to improve mechanics/pacing AP staggered stance weight shift  2x5 CGA-minA to assist w/ mechanics/pacing Slow standing marches w/ UE support on  walker x5 Seated marches 2x5 BIL HEP handout + education   [facility] Adult PT Treatment:                                                DATE: 06/22/23 Therapeutic Exercise: Supine bridge x 15  SLR  x 10  Hamstring, calf stretch with sheet  Hip abd/ER green band x 15  Therapeutic Activity: 2 min walk 137 feet with rolling walker , min A  no increased pain  Self Care: Start on land   Transition to DB when able to schedule.     PATIENT EDUCATION:  Education details: rationale for interventions, HEP  Person educated: Patient Education method: Explanation, Demonstration, Tactile cues, Verbal cues Education comprehension: verbalized understanding, returned demonstration, verbal cues required, tactile cues required, and needs further education     HOME EXERCISE PROGRAM: Access Code:  [email or web address] URL: [email or web address] Date: 07/13/2023 Prepared by: Fransisco Hertz  Program Notes - with weight shifts, please use counter or rolling walker for upper body support, chair nearby for seated rest. would also recommend caregiver supervision/assist  Exercises - Seated Hip Abduction  - 2-3 x daily - 1 sets - 8 reps - Seated Heel Toe Raises  - 2-3 x daily - 1 sets - 12 reps - Seated March  - 2-3 x daily - 1 sets - 5 reps - Seated Long Arc Quad  - 2-3 x daily - 1 sets - 8 reps - Side to Side Weight Shift with Counter Support  - 2-3 x daily - 1 sets - 5 reps  ASSESSMENT:  CLINICAL IMPRESSION:  07/15/2023 patient arrived late so session was abbreviated.  She did however tolerate more activity today and was standing for the majority of the session.  Use the parallel bars for maximal support while having visual cues in the mirror.  She had no pain during session.  She needed moderate cueing for improved step length but with the walker much improved versus a cane.  She will continue physical therapy at [facility].  From this point on as it is closer to her home.  OBJECTIVE IMPAIRMENTS: Abnormal gait, decreased activity tolerance, decreased balance, decreased cognition, decreased coordination, decreased endurance, decreased knowledge of condition, decreased knowledge of use of DME, decreased mobility, difficulty walking, decreased ROM, decreased strength, decreased safety awareness, increased edema, increased fascial restrictions, impaired perceived functional ability, impaired flexibility, impaired vision/preception, improper body mechanics, and postural dysfunction.   ACTIVITY LIMITATIONS: carrying, lifting, bending, standing, squatting, stairs, transfers, bed mobility, and locomotion level  PARTICIPATION LIMITATIONS: meal prep, cleaning, shopping, and community activity  PERSONAL FACTORS: Age, Time since onset of injury/illness/exacerbation, and 3+  comorbidities: visual deficits, osteopenia, cognition  are also affecting patient's functional outcome.   REHAB POTENTIAL: Fair given severity of deficits, comorbidities  CLINICAL DECISION MAKING: Evolving/moderate complexity  EVALUATION COMPLEXITY: Moderate   GOALS: Goals reviewed with patient? No - on eval did discuss role of PT, PT POC  SHORT TERM GOALS: Target date: 07/20/2023   Pt will demonstrate appropriate understanding and performance of initially prescribed HEP in order to facilitate improved independence with management of symptoms.  Baseline: HEP provided on eval Goal status: ongoing   2. Pt will score greater than or equal to NT on FOTO in order to demonstrate improved perception of function due to symptoms.  Baseline: NT   Goal status:DEFER  LONG TERM GOALS: Target date:08/17/2023   Pt will score NT on FOTO in order to demonstrate improved perception of functional status due to symptoms.  Baseline: NT  Goal status: INITIAL  2.  Pt will be able to perform sit to stand w/ safe mechanics and LRAD assistive device in order to facilitate improved safety w/ transfers. Baseline: needs min A  Goal status: INITIAL  3.  Pt will be able to ambulate 150 feet mod I  LRAD in order to facilitate improved safety w/ household navigation. Baseline: 100 feet min A quad cane 07/15/23 45-50 feet with close supervision and RW , needs A to avoid obstacles  Goal status:ongoing   4. Pt will demonstrate appropriate performance of final prescribed HEP in order to facilitate improved self-management of symptoms post-discharge.   Baseline: initial HEP prescribed  Goal status: INITIAL    5. Pt will demonstrate 5 x STS x 15 sec in order to facilitate improved functional strength.  Baseline: 28 sec   Goal status: INITIAL   PLAN:  PT FREQUENCY: 1-2x/week  PT DURATION: 6 weeks  PLANNED INTERVENTIONS: Therapeutic exercises, Therapeutic activity, Neuromuscular re-education, Balance  training, Gait training, Patient/Family education, Self Care, Joint mobilization, DME instructions, Aquatic Therapy, and Re-evaluation.  PLAN FOR NEXT SESSION: Review/update HEP PRN. Work on [employer] exercises as appropriate with emphasis on balance, ankle ROM general strength . Symptom modification strategies as indicated/appropriate.    Karie Mainland, PT 07/15/23 1:34 PM Phone: [phone] Fax: [phone]

## 2023-07-15 ENCOUNTER — Ambulatory Visit: Payer: Medicare Other | Admitting: Physical Therapy

## 2023-07-15 ENCOUNTER — Encounter: Payer: Self-pay | Admitting: Physical Therapy

## 2023-07-15 DIAGNOSIS — R2681 Unsteadiness on feet: Secondary | ICD-10-CM | POA: Diagnosis not present

## 2023-07-15 DIAGNOSIS — M6281 Muscle weakness (generalized): Secondary | ICD-10-CM

## 2023-07-15 DIAGNOSIS — R293 Abnormal posture: Secondary | ICD-10-CM | POA: Diagnosis not present

## 2023-07-15 DIAGNOSIS — R262 Difficulty in walking, not elsewhere classified: Secondary | ICD-10-CM

## 2023-07-19 DIAGNOSIS — N184 Chronic kidney disease, stage 4 (severe): Secondary | ICD-10-CM | POA: Diagnosis not present

## 2023-07-19 DIAGNOSIS — N390 Urinary tract infection, site not specified: Secondary | ICD-10-CM | POA: Diagnosis not present

## 2023-07-19 NOTE — Therapy (Deleted)
OUTPATIENT PHYSICAL THERAPY TREATMENT   Patient Name: Laurie [name] MRN: [id number] DOB:[birth year], 89 y.o., female Today's Date: 07/19/2023  END OF SESSION:        Past Medical History:  Diagnosis Date   CKD (chronic kidney disease), stage III (HCC)    Family history of breast cancer    mother   Family history of colon cancer    father   Glaucoma    Osteopenia    SBO (small bowel obstruction) (HCC)    Past Surgical History:  Procedure Laterality Date   APPENDECTOMY     CESAREAN SECTION     x6   CORONARY STENT INTERVENTION N/A 06/08/2022   Procedure: CORONARY STENT INTERVENTION;  Surgeon: Lennette Bihari, MD;  Location: [facility];  Service: Cardiovascular;  Laterality: N/A;   ESOPHAGOGASTRODUODENOSCOPY  07/07/2012   Procedure: ESOPHAGOGASTRODUODENOSCOPY (EGD);  Surgeon: Willis Modena, MD;  Location: [facility];  Service: Endoscopy;  Laterality: N/A;   GLAUCOMA SURGERY Bilateral 02/17/2022   HIP SURGERY     2 rods were put on left hip   LEFT COLECTOMY  04/30/2011   Sr [facility]   LEFT HEART CATH AND CORONARY ANGIOGRAPHY N/A 06/08/2022   Procedure: LEFT HEART CATH AND CORONARY ANGIOGRAPHY;  Surgeon: Lennette Bihari, MD;  Location: [facility];  Service: Cardiovascular;  Laterality: N/A;   tubal cyst  04/30/2011   L fallopian tube cyst incidentally found   Patient Active Problem List   Diagnosis Date Noted   Non-ST elevation (NSTEMI) myocardial infarction (HCC) 06/06/2022   Elevated troponin 06/05/2022   Hiatal hernia 06/05/2022   Elevated d-dimer 06/05/2022   Abnormal urinalysis 06/05/2022   HLD (hyperlipidemia) 06/05/2022   History of stroke 06/05/2022   Chronic kidney disease, stage 3b (HCC) 06/05/2022   Uncontrolled hypertension 12/30/2021   Glaucoma 12/29/2021   Acute ischemic stroke (HCC) 12/28/2021   Chronic pain of right knee 01/15/2020   Unilateral primary osteoarthritis, left knee 01/25/2018   Unilateral primary osteoarthritis,  right knee 01/25/2018   Chronic pain of both knees 01/25/2018   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HCC) 12/22/2010    PCP: Daisy Floro, MD  REFERRING PROVIDER: Huel Cote, MD   REFERRING DIAG: W19.XXXD (ICD-10-CM) - Fall, subsequent encounter (Referral comment: "Gait training  Aquatic therapy")  Rationale for Evaluation and Treatment: Rehabilitation  THERAPY DIAG:  No diagnosis found.  ONSET DATE: 10/2022  SUBJECTIVE:  SUBJECTIVE STATEMENT: No pain .  She reports some knee pain once in awhile. Arr late.      PERTINENT HISTORY:  CKD, osteopenia, SBO, hx MI, HTN, hx CVA June 2023  L hip ORIF medullary nailing s/p fall DC home health 10/2021.   PAIN:  Are you having pain: no resting , moderate pain while standing Location/description: NA Best-worst over past week: NA   - aggravating factors: NA  - Easing factors: NA     PRECAUTIONS: Right eye blindness, left eye glaucoma with impairment, cardiac hx, fall risk, osteopenia  WEIGHT BEARING RESTRICTIONS: No  FALLS:  Has patient fallen in last 6 months? Yes. Number of falls 1  LIVING ENVIRONMENT: Lives with: lives with their son Lives in: House/apartment Stairs: Yes: External: 2 steps; on right going up Has following equipment at home: Quad cane large base, Environmental consultant - 4 wheeled, and Wheelchair (manual)  OCCUPATION: Not  PLOF: Requires assistive device for independence, Needs assistance with ADLs, Needs assistance with homemaking, Needs assistance with gait, and Leisure: reads the newspaper, light cleaning, plants  Daughter reports the patient is independent with ambulation in her home using a quad cane but she is not without 24-hour supervision.   PATIENT GOALS: I want to be able to walk better and move like I used to be  NEXT MD  VISIT: Multiple visits as needed  OBJECTIVE:   DIAGNOSTIC FINDINGS:     MRI brain (without) demonstrating: - Moderate atrophy and moderate chronic small vessel ischemic disease. - Few chronic cerebral microhemorrhages. - No acute findings. No change from 12/29/21.    COMPARISON:  01/29/2023   FINDINGS: Stable hardware is noted in the proximal left femur. No acute fracture or dislocation is noted. No soft tissue changes are seen. Significant degenerative changes about the left knee joint are noted.   IMPRESSION: Postsurgical changes in the left hip. Degenerative changes in the left knee. The overall appearance is stable from the prior exam.        SURVEYS:  NT on eval   SCREENING FOR RED FLAGS: Red flag questioning/screening reassuring NEG   COGNITION: Overall cognitive status: History of cognitive impairments - at baseline     SENSATION: WFL  POSTURE: Posterior leanrounded shoulders, forward head, increased thoracic kyphosis, flexed trunk , and posterior lean  PALPATION: N/A   LOWER EXTREMITY ROM:     Active  Right eval Left eval Rt./Lt.  06/22/23  Hip flexion     Hip extension     Ankle DF  Lacks 8 deg  Lacks 8 deg  Approx 10 deg bilateral   Hip external rotation     Knee extension   -15  Knee flexion   105  (Blank rows = not tested) (Key: WFL = within functional limits not formally assessed, * = concordant pain, s = stiffness/stretching sensation, NT = not tested)  Comments:    LOWER EXTREMITY MMT:    MMT Right eval Left eval Rt./lt. 06/22/23  Hip flexion 4 4 4   Hip abduction (modified sitting)     Hip internal rotation      Hip external rotation     Knee flexion 4+ 4+ 4+  Knee extension 4+ 4+ 4+  Ankle dorsiflexion   3-/5   (Blank rows = not tested) (Key: WFL = within functional limits not formally assessed, * = concordant pain, s = stiffness/stretching sensation, NT = not tested)  Comments:     FUNCTIONAL TESTS:  5 times sit to stand:  28  sec  with min A and hands on the chair 2 minute walk test: 100 feet    06/22/23 2 min walk test 137 feet with rolling walker, min A  GAIT: Distance walked: 100  Assistive device utilized: Quad cane large base Level of assistance: Min A Comments: Posterior lean maximal cueing required to use the cane firmly on the ground, improved step length and heel strike  TODAY'S TREATMENT:       [facility] Adult PT Treatment:                                                DATE: 07/15/23 Therapeutic Exercise: Seated green band:  clam, march , hamstring curl and LAQ x 15 each  Standing heel raises x 10  Standing high march   x 10  Sidestepping 4 x 10 feet with cues  Hip abduction  x 10  Therapeutic Activity: Retro walking in parallel bars  Gait with RW 40 feet x 2    [facility] Adult PT Treatment:                                                DATE: 07/13/23 Therapeutic Exercise: Seated red band hip abduction 2x5 cues for pacing and posture, breath control  Seated heel raises x10 cues for improved heel contact on LLE LAQ + DF 2x8 BIL cues for ankle DF HEP update + education/handout, education on caregiver assist for standing activities  Therapeutic Activity: AP weight shifts w/ RW emphasis on foot contact x10 CGA and tactile cues  Fwd step + weight shift 2x5 BIL w/ RW CGA  Lateral step + weight shift x6 BIL w RW CGA STS for each of above sets, does require cueing for UE sequencing and mechanics, tactile cues for improved fwd weight shift    [facility] Adult PT Treatment:                                                DATE: 07/08/23 Therapeutic Exercise: LAQ x10 BIL cues for pacing R seated quad set w/ heel prop on stool 2x8 tactile cues Seated heel raises x20 cues for form and pacing ML weight shifts x10 w RW  Staggered stance AP weight shift w RW x10 BIL Seated abduction AROM x8 BIL cues for comfortable ROM and posture Verbal HEP review/education  Therapeutic Activity: Assist with car transfer  HHA, tactile/verbal cues for sequencing, safety, and mechanics   [facility] Adult PT Treatment:                                                DATE: 07/06/23 Therapeutic Exercise: LAQ x10 BIL (began w/ sheet assist for full ROM but deferred after first couple reps) Seated heel raises x10 ML weight shift w/ B UE support on walker CGA x8 each limb, CGA vs minA to improve mechanics/pacing AP staggered stance weight shift  2x5 CGA-minA to assist w/ mechanics/pacing Slow standing marches w/ UE support on  walker x5 Seated marches 2x5 BIL HEP handout + education   [facility] Adult PT Treatment:                                                DATE: 06/22/23 Therapeutic Exercise: Supine bridge x 15  SLR  x 10  Hamstring, calf stretch with sheet  Hip abd/ER green band x 15  Therapeutic Activity: 2 min walk 137 feet with rolling walker , min A  no increased pain  Self Care: Start on land   Transition to DB when able to schedule.     PATIENT EDUCATION:  Education details: rationale for interventions, HEP  Person educated: Patient Education method: Explanation, Demonstration, Tactile cues, Verbal cues Education comprehension: verbalized understanding, returned demonstration, verbal cues required, tactile cues required, and needs further education     HOME EXERCISE PROGRAM: Access Code: [email or web address] URL: [email or web address] Date: 07/13/2023 Prepared by: Fransisco Hertz  Program Notes - with weight shifts, please use counter or rolling walker for upper body support, chair nearby for seated rest. would also recommend caregiver supervision/assist  Exercises - Seated Hip Abduction  - 2-3 x daily - 1 sets - 8 reps - Seated Heel Toe Raises  - 2-3 x daily - 1 sets - 12 reps - Seated March  - 2-3 x daily - 1 sets - 5 reps - Seated Long Arc Quad  - 2-3 x daily - 1 sets - 8 reps - Side to Side Weight Shift with Counter Support  - 2-3 x daily - 1 sets - 5 reps  ASSESSMENT:  CLINICAL  IMPRESSION:  07/19/2023 patient arrived late so session was abbreviated.  She did however tolerate more activity today and was standing for the majority of the session.  Use the parallel bars for maximal support while having visual cues in the mirror.  She had no pain during session.  She needed moderate cueing for improved step length but with the walker much improved versus a cane.  She will continue physical therapy at [facility].  From this point on as it is closer to her home.  OBJECTIVE IMPAIRMENTS: Abnormal gait, decreased activity tolerance, decreased balance, decreased cognition, decreased coordination, decreased endurance, decreased knowledge of condition, decreased knowledge of use of DME, decreased mobility, difficulty walking, decreased ROM, decreased strength, decreased safety awareness, increased edema, increased fascial restrictions, impaired perceived functional ability, impaired flexibility, impaired vision/preception, improper body mechanics, and postural dysfunction.   ACTIVITY LIMITATIONS: carrying, lifting, bending, standing, squatting, stairs, transfers, bed mobility, and locomotion level  PARTICIPATION LIMITATIONS: meal prep, cleaning, shopping, and community activity  PERSONAL FACTORS: Age, Time since onset of injury/illness/exacerbation, and 3+ comorbidities: visual deficits, osteopenia, cognition  are also affecting patient's functional outcome.   REHAB POTENTIAL: Fair given severity of deficits, comorbidities  CLINICAL DECISION MAKING: Evolving/moderate complexity  EVALUATION COMPLEXITY: Moderate   GOALS: Goals reviewed with patient? No - on eval did discuss role of PT, PT POC  SHORT TERM GOALS: Target date: 07/20/2023   Pt will demonstrate appropriate understanding and performance of initially prescribed HEP in order to facilitate improved independence with management of symptoms.  Baseline: HEP provided on eval Goal status: ongoing   2. Pt will  score greater than or equal to NT on FOTO in order to demonstrate improved perception of function due to symptoms.  Baseline: NT   Goal status:DEFER  LONG TERM GOALS: Target date:08/17/2023   Pt will score NT on FOTO in order to demonstrate improved perception of functional status due to symptoms.  Baseline: NT  Goal status: INITIAL  2.  Pt will be able to perform sit to stand w/ safe mechanics and LRAD assistive device in order to facilitate improved safety w/ transfers. Baseline: needs min A  Goal status: INITIAL  3.  Pt will be able to ambulate 150 feet mod I  LRAD in order to facilitate improved safety w/ household navigation. Baseline: 100 feet min A quad cane 07/15/23 45-50 feet with close supervision and RW , needs A to avoid obstacles  Goal status:ongoing   4. Pt will demonstrate appropriate performance of final prescribed HEP in order to facilitate improved self-management of symptoms post-discharge.   Baseline: initial HEP prescribed  Goal status: INITIAL    5. Pt will demonstrate 5 x STS x 15 sec in order to facilitate improved functional strength.  Baseline: 28 sec   Goal status: INITIAL   PLAN:  PT FREQUENCY: 1-2x/week  PT DURATION: 6 weeks  PLANNED INTERVENTIONS: Therapeutic exercises, Therapeutic activity, Neuromuscular re-education, Balance training, Gait training, Patient/Family education, Self Care, Joint mobilization, DME instructions, Aquatic Therapy, and Re-evaluation.  PLAN FOR NEXT SESSION: Review/update HEP PRN. Work on [employer] exercises as appropriate with emphasis on balance, ankle ROM general strength . Symptom modification strategies as indicated/appropriate.   Royal Hawthorn PT, DPT 07/19/23  11:48 AM

## 2023-07-23 ENCOUNTER — Ambulatory Visit (HOSPITAL_BASED_OUTPATIENT_CLINIC_OR_DEPARTMENT_OTHER): Payer: Medicare Other | Admitting: Physical Therapy

## 2023-07-23 NOTE — Therapy (Unsigned)
OUTPATIENT PHYSICAL THERAPY TREATMENT   Patient Name: Laurie [name] MRN: [id number] DOB:08/06/1931, 89 y.o., female Today's Date: 07/23/2023  END OF SESSION:        Past Medical History:  Diagnosis Date   CKD (chronic kidney disease), stage III (HCC)    Family history of breast cancer    mother   Family history of colon cancer    father   Glaucoma    Osteopenia    SBO (small bowel obstruction) (HCC)    Past Surgical History:  Procedure Laterality Date   APPENDECTOMY     CESAREAN SECTION     x6   CORONARY STENT INTERVENTION N/A 06/08/2022   Procedure: CORONARY STENT INTERVENTION;  Surgeon: Lennette Bihari, MD;  Location: [facility];  Service: Cardiovascular;  Laterality: N/A;   ESOPHAGOGASTRODUODENOSCOPY  07/07/2012   Procedure: ESOPHAGOGASTRODUODENOSCOPY (EGD);  Surgeon: Willis Modena, MD;  Location: [facility] ENDOSCOPY;  Service: Endoscopy;  Laterality: N/A;   GLAUCOMA SURGERY Bilateral 02/17/2022   HIP SURGERY     2 rods were put on left hip   LEFT COLECTOMY  04/30/2011   [facility]   LEFT HEART CATH AND CORONARY ANGIOGRAPHY N/A 06/08/2022   Procedure: LEFT HEART CATH AND CORONARY ANGIOGRAPHY;  Surgeon: Lennette Bihari, MD;  Location: [facility];  Service: Cardiovascular;  Laterality: N/A;   tubal cyst  04/30/2011   L fallopian tube cyst incidentally found   Patient Active Problem List   Diagnosis Date Noted   Non-ST elevation (NSTEMI) myocardial infarction (HCC) 06/06/2022   Elevated troponin 06/05/2022   Hiatal hernia 06/05/2022   Elevated d-dimer 06/05/2022   Abnormal urinalysis 06/05/2022   HLD (hyperlipidemia) 06/05/2022   History of stroke 06/05/2022   Chronic kidney disease, stage 3b (HCC) 06/05/2022   Uncontrolled hypertension 12/30/2021   Glaucoma 12/29/2021   Acute ischemic stroke (HCC) 12/28/2021   Chronic pain of right knee 01/15/2020   Unilateral primary osteoarthritis, left knee 01/25/2018   Unilateral primary osteoarthritis,  right knee 01/25/2018   Chronic pain of both knees 01/25/2018   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HCC) 12/22/2010    PCP: Daisy Floro, MD  REFERRING PROVIDER: Huel Cote, MD   REFERRING DIAG: W19.XXXD (ICD-10-CM) - Fall, subsequent encounter (Referral comment: "Gait training  Aquatic therapy")  Rationale for Evaluation and Treatment: Rehabilitation  THERAPY DIAG:  No diagnosis found.  ONSET DATE: 10/2022  SUBJECTIVE:  SUBJECTIVE STATEMENT: No pain .  She reports some knee pain once in awhile. Arr late.      PERTINENT HISTORY:  CKD, osteopenia, SBO, hx MI, HTN, hx CVA June 2023  L hip ORIF medullary nailing s/p fall DC home health 10/2021.   PAIN:  Are you having pain: no resting , moderate pain while standing Location/description: NA Best-worst over past week: NA   - aggravating factors: NA  - Easing factors: NA     PRECAUTIONS: Right eye blindness, left eye glaucoma with impairment, cardiac hx, fall risk, osteopenia  WEIGHT BEARING RESTRICTIONS: No  FALLS:  Has patient fallen in last 6 months? Yes. Number of falls 1  LIVING ENVIRONMENT: Lives with: lives with their son Lives in: House/apartment Stairs: Yes: External: 2 steps; on right going up Has following equipment at home: Quad cane large base, Environmental consultant - 4 wheeled, and Wheelchair (manual)  OCCUPATION: Not  PLOF: Requires assistive device for independence, Needs assistance with ADLs, Needs assistance with homemaking, Needs assistance with gait, and Leisure: reads the newspaper, light cleaning, plants  Daughter reports the patient is independent with ambulation in her home using a quad cane but she is not without 24-hour supervision.   PATIENT GOALS: I want to be able to walk better and move like I used to be  NEXT MD  VISIT: Multiple visits as needed  OBJECTIVE:   DIAGNOSTIC FINDINGS:     MRI brain (without) demonstrating: - Moderate atrophy and moderate chronic small vessel ischemic disease. - Few chronic cerebral microhemorrhages. - No acute findings. No change from 12/29/21.    COMPARISON:  01/29/2023   FINDINGS: Stable hardware is noted in the proximal left femur. No acute fracture or dislocation is noted. No soft tissue changes are seen. Significant degenerative changes about the left knee joint are noted.   IMPRESSION: Postsurgical changes in the left hip. Degenerative changes in the left knee. The overall appearance is stable from the prior exam.        SURVEYS:  NT on eval   SCREENING FOR RED FLAGS: Red flag questioning/screening reassuring NEG   COGNITION: Overall cognitive status: History of cognitive impairments - at baseline     SENSATION: WFL  POSTURE: Posterior leanrounded shoulders, forward head, increased thoracic kyphosis, flexed trunk , and posterior lean  PALPATION: N/A   LOWER EXTREMITY ROM:     Active  Right eval Left eval Rt./Lt.  06/22/23  Hip flexion     Hip extension     Ankle DF  Lacks 8 deg  Lacks 8 deg  Approx 10 deg bilateral   Hip external rotation     Knee extension   -15  Knee flexion   105  (Blank rows = not tested) (Key: WFL = within functional limits not formally assessed, * = concordant pain, s = stiffness/stretching sensation, NT = not tested)  Comments:    LOWER EXTREMITY MMT:    MMT Right eval Left eval Rt./lt. 06/22/23  Hip flexion 4 4 4   Hip abduction (modified sitting)     Hip internal rotation      Hip external rotation     Knee flexion 4+ 4+ 4+  Knee extension 4+ 4+ 4+  Ankle dorsiflexion   3-/5   (Blank rows = not tested) (Key: WFL = within functional limits not formally assessed, * = concordant pain, s = stiffness/stretching sensation, NT = not tested)  Comments:     FUNCTIONAL TESTS:  5 times sit to stand:  28  sec  with min A and hands on the chair 2 minute walk test: 100 feet    06/22/23 2 min walk test 137 feet with rolling walker, min A  GAIT: Distance walked: 100  Assistive device utilized: Quad cane large base Level of assistance: Min A Comments: Posterior lean maximal cueing required to use the cane firmly on the ground, improved step length and heel strike  TODAY'S TREATMENT:       [facility] Adult PT Treatment:                                                DATE: 07/15/23 Therapeutic Exercise: Seated green band:  clam, march , hamstring curl and LAQ x 15 each  Standing heel raises x 10  Standing high march   x 10  Sidestepping 4 x 10 feet with cues  Hip abduction  x 10  Therapeutic Activity: Retro walking in parallel bars  Gait with RW 40 feet x 2    [facility] Adult PT Treatment:                                                DATE: 07/13/23 Therapeutic Exercise: Seated red band hip abduction 2x5 cues for pacing and posture, breath control  Seated heel raises x10 cues for improved heel contact on LLE LAQ + DF 2x8 BIL cues for ankle DF HEP update + education/handout, education on caregiver assist for standing activities  Therapeutic Activity: AP weight shifts w/ RW emphasis on foot contact x10 CGA and tactile cues  Fwd step + weight shift 2x5 BIL w/ RW CGA  Lateral step + weight shift x6 BIL w RW CGA STS for each of above sets, does require cueing for UE sequencing and mechanics, tactile cues for improved fwd weight shift    [facility] Adult PT Treatment:                                                DATE: 07/08/23 Therapeutic Exercise: LAQ x10 BIL cues for pacing R seated quad set w/ heel prop on stool 2x8 tactile cues Seated heel raises x20 cues for form and pacing ML weight shifts x10 w RW  Staggered stance AP weight shift w RW x10 BIL Seated abduction AROM x8 BIL cues for comfortable ROM and posture Verbal HEP review/education  Therapeutic Activity: Assist with car transfer  HHA, tactile/verbal cues for sequencing, safety, and mechanics   [facility] Adult PT Treatment:                                                DATE: 07/06/23 Therapeutic Exercise: LAQ x10 BIL (began w/ sheet assist for full ROM but deferred after first couple reps) Seated heel raises x10 ML weight shift w/ B UE support on walker CGA x8 each limb, CGA vs minA to improve mechanics/pacing AP staggered stance weight shift  2x5 CGA-minA to assist w/ mechanics/pacing Slow standing marches w/ UE support on  walker x5 Seated marches 2x5 BIL HEP handout + education   [facility] Adult PT Treatment:                                                DATE: 06/22/23 Therapeutic Exercise: Supine bridge x 15  SLR  x 10  Hamstring, calf stretch with sheet  Hip abd/ER green band x 15  Therapeutic Activity: 2 min walk 137 feet with rolling walker , min A  no increased pain  Self Care: Start on land   Transition to DB when able to schedule.     PATIENT EDUCATION:  Education details: rationale for interventions, HEP  Person educated: Patient Education method: Explanation, Demonstration, Tactile cues, Verbal cues Education comprehension: verbalized understanding, returned demonstration, verbal cues required, tactile cues required, and needs further education     HOME EXERCISE PROGRAM: Access Code: [email or web address] URL: [email or web address] Date: 07/13/2023 Prepared by: Fransisco Hertz  Program Notes - with weight shifts, please use counter or rolling walker for upper body support, chair nearby for seated rest. would also recommend caregiver supervision/assist  Exercises - Seated Hip Abduction  - 2-3 x daily - 1 sets - 8 reps - Seated Heel Toe Raises  - 2-3 x daily - 1 sets - 12 reps - Seated March  - 2-3 x daily - 1 sets - 5 reps - Seated Long Arc Quad  - 2-3 x daily - 1 sets - 8 reps - Side to Side Weight Shift with Counter Support  - 2-3 x daily - 1 sets - 5 reps  ASSESSMENT:  CLINICAL  IMPRESSION:  07/23/2023 patient arrived late so session was abbreviated.  She did however tolerate more activity today and was standing for the majority of the session.  Use the parallel bars for maximal support while having visual cues in the mirror.  She had no pain during session.  She needed moderate cueing for improved step length but with the walker much improved versus a cane.  She will continue physical therapy at [facility] look patient.  From this point on as it is closer to her home.  OBJECTIVE IMPAIRMENTS: Abnormal gait, decreased activity tolerance, decreased balance, decreased cognition, decreased coordination, decreased endurance, decreased knowledge of condition, decreased knowledge of use of DME, decreased mobility, difficulty walking, decreased ROM, decreased strength, decreased safety awareness, increased edema, increased fascial restrictions, impaired perceived functional ability, impaired flexibility, impaired vision/preception, improper body mechanics, and postural dysfunction.   ACTIVITY LIMITATIONS: carrying, lifting, bending, standing, squatting, stairs, transfers, bed mobility, and locomotion level  PARTICIPATION LIMITATIONS: meal prep, cleaning, shopping, and community activity  PERSONAL FACTORS: Age, Time since onset of injury/illness/exacerbation, and 3+ comorbidities: visual deficits, osteopenia, cognition  are also affecting patient's functional outcome.   REHAB POTENTIAL: Fair given severity of deficits, comorbidities  CLINICAL DECISION MAKING: Evolving/moderate complexity  EVALUATION COMPLEXITY: Moderate   GOALS: Goals reviewed with patient? No - on eval did discuss role of PT, PT POC  SHORT TERM GOALS: Target date: 07/20/2023   Pt will demonstrate appropriate understanding and performance of initially prescribed HEP in order to facilitate improved independence with management of symptoms.  Baseline: HEP provided on eval Goal status: ongoing   2. Pt will  score greater than or equal to NT on FOTO in order to demonstrate improved perception of function due to symptoms.  Baseline: NT   Goal status:DEFER  LONG TERM GOALS: Target date:08/17/2023   Pt will score NT on FOTO in order to demonstrate improved perception of functional status due to symptoms.  Baseline: NT  Goal status: INITIAL  2.  Pt will be able to perform sit to stand w/ safe mechanics and LRAD assistive device in order to facilitate improved safety w/ transfers. Baseline: needs min A  Goal status: INITIAL  3.  Pt will be able to ambulate 150 feet mod I  LRAD in order to facilitate improved safety w/ household navigation. Baseline: 100 feet min A quad cane 07/15/23 45-50 feet with close supervision and RW , needs A to avoid obstacles  Goal status:ongoing   4. Pt will demonstrate appropriate performance of final prescribed HEP in order to facilitate improved self-management of symptoms post-discharge.   Baseline: initial HEP prescribed  Goal status: INITIAL    5. Pt will demonstrate 5 x STS x 15 sec in order to facilitate improved functional strength.  Baseline: 28 sec   Goal status: INITIAL   PLAN:  PT FREQUENCY: 1-2x/week  PT DURATION: 6 weeks  PLANNED INTERVENTIONS: Therapeutic exercises, Therapeutic activity, Neuromuscular re-education, Balance training, Gait training, Patient/Family education, Self Care, Joint mobilization, DME instructions, Aquatic Therapy, and Re-evaluation.  PLAN FOR NEXT SESSION: Review/update HEP PRN. Work on [employer] exercises as appropriate with emphasis on balance, ankle ROM general strength . Symptom modification strategies as indicated/appropriate.   Royal Hawthorn PT, DPT 07/23/23  11:31 AM

## 2023-07-24 ENCOUNTER — Ambulatory Visit (HOSPITAL_BASED_OUTPATIENT_CLINIC_OR_DEPARTMENT_OTHER): Payer: Medicare Other | Admitting: Physical Therapy

## 2023-07-26 ENCOUNTER — Ambulatory Visit: Payer: Medicare Other

## 2023-07-26 DIAGNOSIS — R262 Difficulty in walking, not elsewhere classified: Secondary | ICD-10-CM | POA: Diagnosis not present

## 2023-07-26 DIAGNOSIS — R2681 Unsteadiness on feet: Secondary | ICD-10-CM

## 2023-07-26 DIAGNOSIS — R293 Abnormal posture: Secondary | ICD-10-CM

## 2023-07-26 DIAGNOSIS — M6281 Muscle weakness (generalized): Secondary | ICD-10-CM

## 2023-07-26 NOTE — Therapy (Signed)
OUTPATIENT PHYSICAL THERAPY TREATMENT   Patient Name: Laurie [name] MRN: [id number] DOB:12/20/1931, 89 y.o., female Today's Date: 07/26/2023  END OF SESSION:  PT End of Session - 07/26/23 1244     Visit Number 7    Date for PT Re-Evaluation 08/17/23    Authorization Type [employer] MCR    Authorization Time Period 12 visits approved 07/06/2023 - 08/17/2023    Authorization - Visit Number 5    Authorization - Number of Visits 10    PT Start Time 1232    PT Stop Time 1315    PT Time Calculation (min) 43 min    Activity Tolerance Patient tolerated treatment well    Behavior During Therapy WFL for tasks assessed/performed                  Past Medical History:  Diagnosis Date   CKD (chronic kidney disease), stage III (HCC)    Family history of breast cancer    mother   Family history of colon cancer    father   Glaucoma    Osteopenia    SBO (small bowel obstruction) (HCC)    Past Surgical History:  Procedure Laterality Date   APPENDECTOMY     CESAREAN SECTION     x6   CORONARY STENT INTERVENTION N/A 06/08/2022   Procedure: CORONARY STENT INTERVENTION;  Surgeon: Lennette Bihari, MD;  Location: [facility];  Service: Cardiovascular;  Laterality: N/A;   ESOPHAGOGASTRODUODENOSCOPY  07/07/2012   Procedure: ESOPHAGOGASTRODUODENOSCOPY (EGD);  Surgeon: Willis Modena, MD;  Location: [facility] ENDOSCOPY;  Service: Endoscopy;  Laterality: N/A;   GLAUCOMA SURGERY Bilateral 02/17/2022   HIP SURGERY     2 rods were put on left hip   LEFT COLECTOMY  04/30/2011   Sr Streck   LEFT HEART CATH AND CORONARY ANGIOGRAPHY N/A 06/08/2022   Procedure: LEFT HEART CATH AND CORONARY ANGIOGRAPHY;  Surgeon: Lennette Bihari, MD;  Location: [facility];  Service: Cardiovascular;  Laterality: N/A;   tubal cyst  04/30/2011   L fallopian tube cyst incidentally found   Patient Active Problem List   Diagnosis Date Noted   Non-ST elevation (NSTEMI) myocardial infarction (HCC) 06/06/2022    Elevated troponin 06/05/2022   Hiatal hernia 06/05/2022   Elevated d-dimer 06/05/2022   Abnormal urinalysis 06/05/2022   HLD (hyperlipidemia) 06/05/2022   History of stroke 06/05/2022   Chronic kidney disease, stage 3b (HCC) 06/05/2022   Uncontrolled hypertension 12/30/2021   Glaucoma 12/29/2021   Acute ischemic stroke (HCC) 12/28/2021   Chronic pain of right knee 01/15/2020   Unilateral primary osteoarthritis, left knee 01/25/2018   Unilateral primary osteoarthritis, right knee 01/25/2018   Chronic pain of both knees 01/25/2018   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HCC) 12/22/2010    PCP: Daisy Floro, MD  REFERRING PROVIDER: Huel Cote, MD   REFERRING DIAG: W19.XXXD (ICD-10-CM) - Fall, subsequent encounter (Referral comment: "Gait training  Aquatic therapy")  Rationale for Evaluation and Treatment: Rehabilitation  THERAPY DIAG:  Unsteady gait  Difficulty in walking, not elsewhere classified  Abnormal posture  Muscle weakness (generalized)  ONSET DATE: 10/2022  SUBJECTIVE:  SUBJECTIVE STATEMENT: I twisted my Rt knee and I have a little bit of pain today.   PERTINENT HISTORY:  CKD, osteopenia, SBO, hx MI, HTN, hx CVA June 2023  L hip ORIF medullary nailing s/p fall DC home health 10/2021.   PAIN:  Are you having pain: no resting , moderate pain while standing Location/description: NA Best-worst over past week: NA   - aggravating factors: NA  - Easing factors: NA     PRECAUTIONS: Right eye blindness, left eye glaucoma with impairment, cardiac hx, fall risk, osteopenia  WEIGHT BEARING RESTRICTIONS: No  FALLS:  Has patient fallen in last 6 months? Yes. Number of falls 1  LIVING ENVIRONMENT: Lives with: lives with their son Lives in: House/apartment Stairs: Yes: External:  2 steps; on right going up Has following equipment at home: Quad cane large base, Environmental consultant - 4 wheeled, and Wheelchair (manual)  OCCUPATION: Not  PLOF: Requires assistive device for independence, Needs assistance with ADLs, Needs assistance with homemaking, Needs assistance with gait, and Leisure: reads the newspaper, light cleaning, plants  Daughter reports the patient is independent with ambulation in her home using a quad cane but she is not without 24-hour supervision.   PATIENT GOALS: I want to be able to walk better and move like I used to be  NEXT MD VISIT: Multiple visits as needed  OBJECTIVE:   DIAGNOSTIC FINDINGS:     MRI brain (without) demonstrating: - Moderate atrophy and moderate chronic small vessel ischemic disease. - Few chronic cerebral microhemorrhages. - No acute findings. No change from 12/29/21.    COMPARISON:  01/29/2023   FINDINGS: Stable hardware is noted in the proximal left femur. No acute fracture or dislocation is noted. No soft tissue changes are seen. Significant degenerative changes about the left knee joint are noted.   IMPRESSION: Postsurgical changes in the left hip. Degenerative changes in the left knee. The overall appearance is stable from the prior exam.        SURVEYS:  NT on eval   SCREENING FOR RED FLAGS: Red flag questioning/screening reassuring NEG   COGNITION: Overall cognitive status: History of cognitive impairments - at baseline     SENSATION: WFL  POSTURE: Posterior leanrounded shoulders, forward head, increased thoracic kyphosis, flexed trunk , and posterior lean  PALPATION: N/A   LOWER EXTREMITY ROM:     Active  Right eval Left eval Rt./Lt.  06/22/23  Hip flexion     Hip extension     Ankle DF  Lacks 8 deg  Lacks 8 deg  Approx 10 deg bilateral   Hip external rotation     Knee extension   -15  Knee flexion   105  (Blank rows = not tested) (Key: WFL = within functional limits not formally assessed, * =  concordant pain, s = stiffness/stretching sensation, NT = not tested)  Comments:    LOWER EXTREMITY MMT:    MMT Right eval Left eval Rt./lt. 06/22/23  Hip flexion 4 4 4   Hip abduction (modified sitting)     Hip internal rotation      Hip external rotation     Knee flexion 4+ 4+ 4+  Knee extension 4+ 4+ 4+  Ankle dorsiflexion   3-/5   (Blank rows = not tested) (Key: WFL = within functional limits not formally assessed, * = concordant pain, s = stiffness/stretching sensation, NT = not tested)  Comments:     FUNCTIONAL TESTS:  5 times sit to stand: 28 sec  with  min A and hands on the chair 2 minute walk test: 100 feet    06/22/23 2 min walk test 137 feet with rolling walker, min A  GAIT: Distance walked: 100  Assistive device utilized: Quad cane large base Level of assistance: Min A Comments: Posterior lean maximal cueing required to use the cane firmly on the ground, improved step length and heel strike  TODAY'S TREATMENT:      [facility] Adult PT Treatment:                                                DATE: 07/26/23 Therapeutic Exercise: NuStep: level 1x 6 minutes -PT present to discuss progress Seated:  Ball squeeze: 5" hold x15,  Clam: blue loop added 2x10 March 1.5# added to ankles 2x10   hamstring curl: blue loop 2x10  LAQ 1.5# 2x10  Standing heel raises 2x5 Standing high march  x 10  Hip abduction  x 10    [facility] Adult PT Treatment:                                                DATE: 07/15/23 Therapeutic Exercise: Seated green band:  clam, march , hamstring curl and LAQ x 15 each  Standing heel raises x 10  Standing high march   x 10  Sidestepping 4 x 10 feet with cues  Hip abduction  x 10  Therapeutic Activity: Retro walking in parallel bars  Gait with RW 40 feet x 2    [facility] Adult PT Treatment:                                                DATE: 07/13/23 Therapeutic Exercise: Seated red band hip abduction 2x5 cues for pacing and posture, breath control   Seated heel raises x10 cues for improved heel contact on LLE LAQ + DF 2x8 BIL cues for ankle DF HEP update + education/handout, education on caregiver assist for standing activities  Therapeutic Activity: AP weight shifts w/ RW emphasis on foot contact x10 CGA and tactile cues  Fwd step + weight shift 2x5 BIL w/ RW CGA  Lateral step + weight shift x6 BIL w RW CGA STS for each of above sets, does require cueing for UE sequencing and mechanics, tactile cues for improved fwd weight shift   PATIENT EDUCATION:  Education details: rationale for interventions, HEP  Person educated: Patient Education method: Explanation, Demonstration, Tactile cues, Verbal cues Education comprehension: verbalized understanding, returned demonstration, verbal cues required, tactile cues required, and needs further education     HOME EXERCISE PROGRAM: Access Code: [email or web address] URL: [email or web address] Date: 07/13/2023 Prepared by: Fransisco Hertz  Program Notes - with weight shifts, please use counter or rolling walker for upper body support, chair nearby for seated rest. would also recommend caregiver supervision/assist  Exercises - Seated Hip Abduction  - 2-3 x daily - 1 sets - 8 reps - Seated Heel Toe Raises  - 2-3 x daily - 1 sets - 12 reps - Seated March  - 2-3 x daily - 1 sets - 5 reps - Seated Long  Arc Quad  - 2-3 x daily - 1 sets - 8 reps - Side to Side Weight Shift with Counter Support  - 2-3 x daily - 1 sets - 5 reps  ASSESSMENT:  CLINICAL IMPRESSION: Pt reports that she has not been active with her HEP.  Pt emphasized the importance of compliance with exercise to improve strength and safety.  Pt is using her rolling walker for all distances.  She requires assistance by PT as she bumps into objects while navigating in the clinic and requires verbal cues for step length.  Pt fatigues with her 2nd set of seated exercises and first set of standing exercises.  She reported Rt knee pain  with standing tasks.  PT monitored for safety and pain. Patient will benefit from skilled PT to address the below impairments and improve overall function.   OBJECTIVE IMPAIRMENTS: Abnormal gait, decreased activity tolerance, decreased balance, decreased cognition, decreased coordination, decreased endurance, decreased knowledge of condition, decreased knowledge of use of DME, decreased mobility, difficulty walking, decreased ROM, decreased strength, decreased safety awareness, increased edema, increased fascial restrictions, impaired perceived functional ability, impaired flexibility, impaired vision/preception, improper body mechanics, and postural dysfunction.   ACTIVITY LIMITATIONS: carrying, lifting, bending, standing, squatting, stairs, transfers, bed mobility, and locomotion level  PARTICIPATION LIMITATIONS: meal prep, cleaning, shopping, and community activity  PERSONAL FACTORS: Age, Time since onset of injury/illness/exacerbation, and 3+ comorbidities: visual deficits, osteopenia, cognition  are also affecting patient's functional outcome.   REHAB POTENTIAL: Fair given severity of deficits, comorbidities  CLINICAL DECISION MAKING: Evolving/moderate complexity  EVALUATION COMPLEXITY: Moderate   GOALS: Goals reviewed with patient? No - on eval did discuss role of PT, PT POC  SHORT TERM GOALS: Target date: 07/20/2023   Pt will demonstrate appropriate understanding and performance of initially prescribed HEP in order to facilitate improved independence with management of symptoms.  Baseline: HEP provided on eval Goal status: ongoing   2. Pt will score greater than or equal to NT on FOTO in order to demonstrate improved perception of function due to symptoms.  Baseline: NT   Goal status:DEFER   LONG TERM GOALS: Target date:08/17/2023   Pt will score NT on FOTO in order to demonstrate improved perception of functional status due to symptoms.  Baseline: NT  Goal status:  INITIAL  2.  Pt will be able to perform sit to stand w/ safe mechanics and LRAD assistive device in order to facilitate improved safety w/ transfers. Baseline: needs min A  Goal status: INITIAL  3.  Pt will be able to ambulate 150 feet mod I  LRAD in order to facilitate improved safety w/ household navigation. Baseline: 100 feet min A quad cane 07/15/23 45-50 feet with close supervision and RW , needs A to avoid obstacles  Goal status:ongoing   4. Pt will demonstrate appropriate performance of final prescribed HEP in order to facilitate improved self-management of symptoms post-discharge.   Baseline: initial HEP prescribed  Goal status: INITIAL    5. Pt will demonstrate 5 x STS x 15 sec in order to facilitate improved functional strength.  Baseline: 28 sec   Goal status: INITIAL   PLAN:  PT FREQUENCY: 1-2x/week  PT DURATION: 6 weeks  PLANNED INTERVENTIONS: Therapeutic exercises, Therapeutic activity, Neuromuscular re-education, Balance training, Gait training, Patient/Family education, Self Care, Joint mobilization, DME instructions, Aquatic Therapy, and Re-evaluation.  PLAN FOR NEXT SESSION:  Work on ROM/strength exercises as appropriate with emphasis on balance, ankle ROM general strength . Symptom modification strategies as indicated/appropriate.  Lorrene Reid, PT 07/26/23 1:18 PM

## 2023-07-29 DIAGNOSIS — M1711 Unilateral primary osteoarthritis, right knee: Secondary | ICD-10-CM | POA: Diagnosis not present

## 2023-07-30 IMAGING — MR MR MRA NECK W/O CM
1 of 2 series · 19 of 48 positions shown · non-contrast
Comparison: MRI same day

CLINICAL DATA: Stroke, follow-up. Hypertension. Chronic renal
disease. Slurred speech and lethargy. Left brain white matter
infarction shown by MRI earlier today.

EXAM:
MRA NECK WITHOUT CONTRAST
MRA HEAD WITHOUT CONTRAST
TECHNIQUE: Angiographic images of the Circle of Willis were acquired using MRA
technique without intravenous contrast.

[Series 2: ax (id) · axial · 1.0mm · 0.43mm/px · z∈[-73,+10]mm · 19 of 176 slices shown]
[im 1/176]
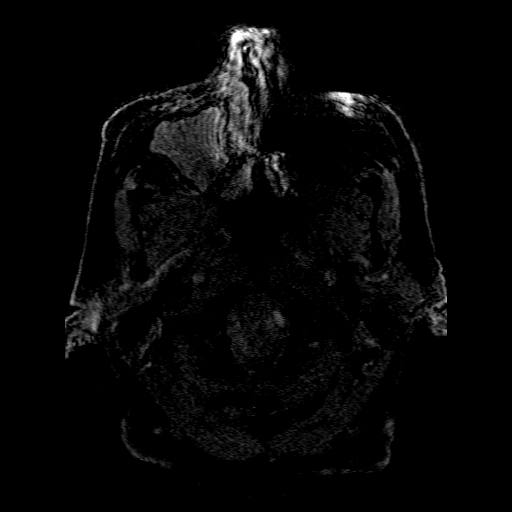
[im 8/176]
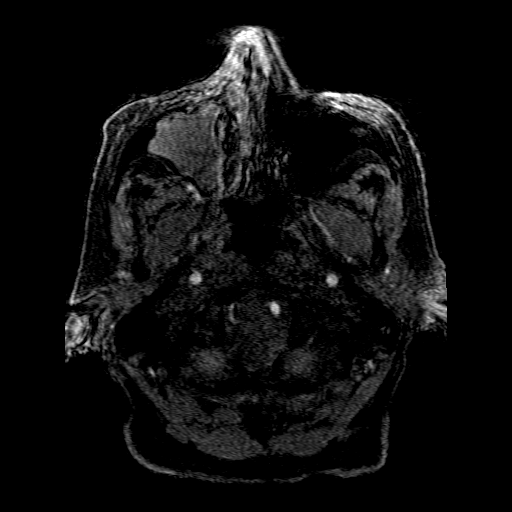
[im 15/176]
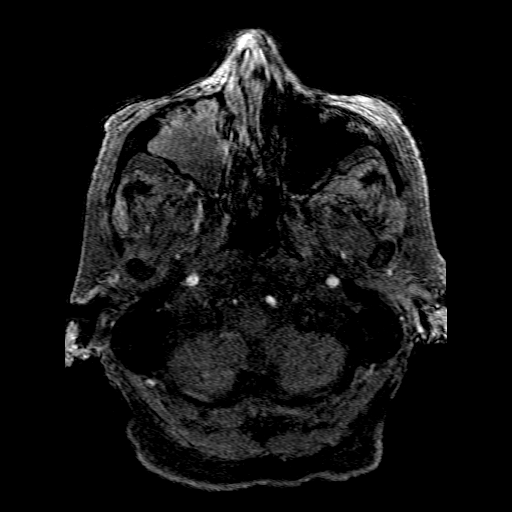
[im 22/176]
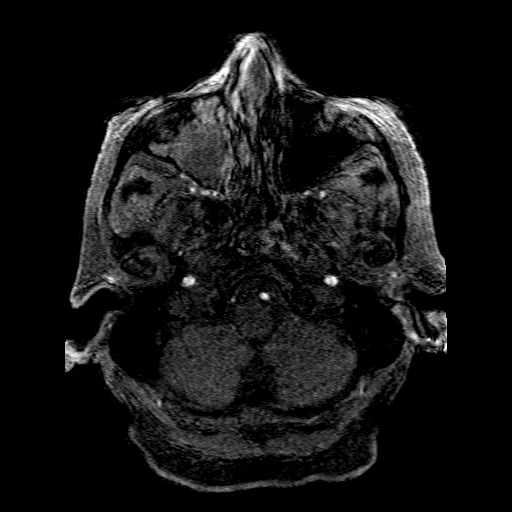
[im 29/176]
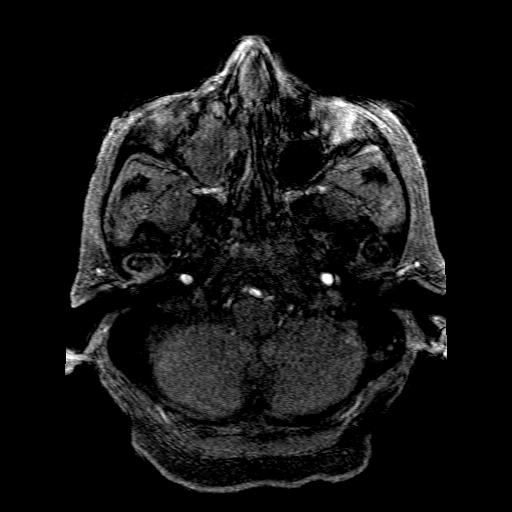
[im 36/176]
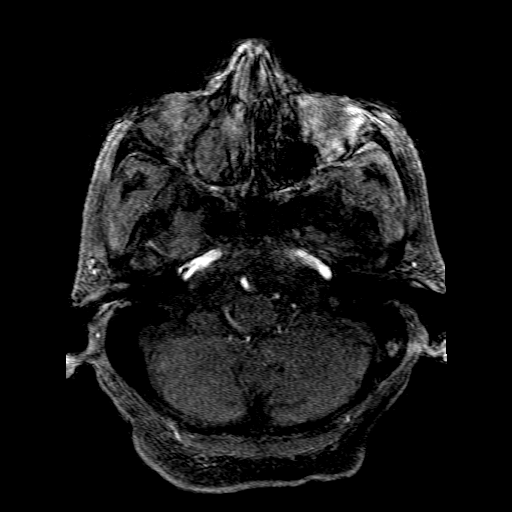
[im 43/176]
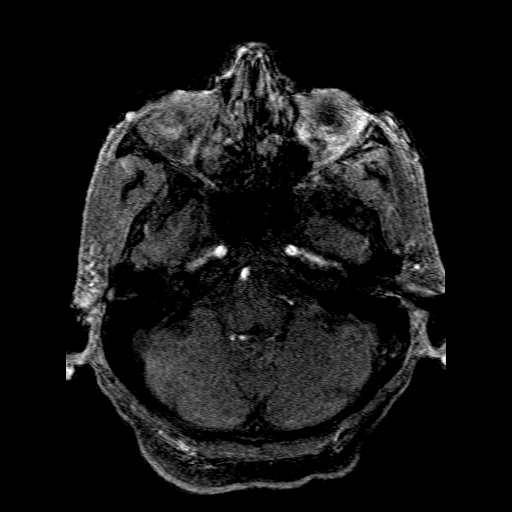
[im 50/176]
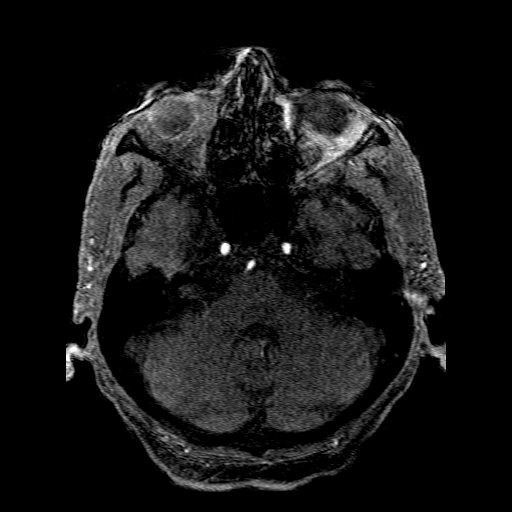
[im 57/176]
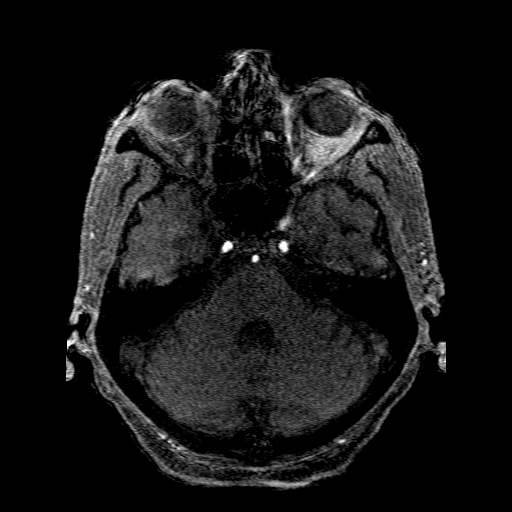
[im 64/176]
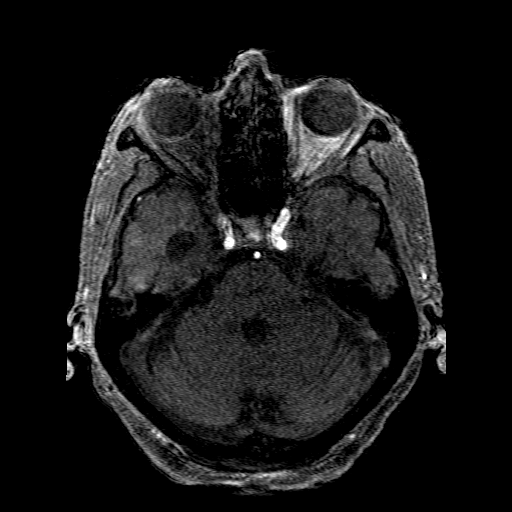
[im 71/176]
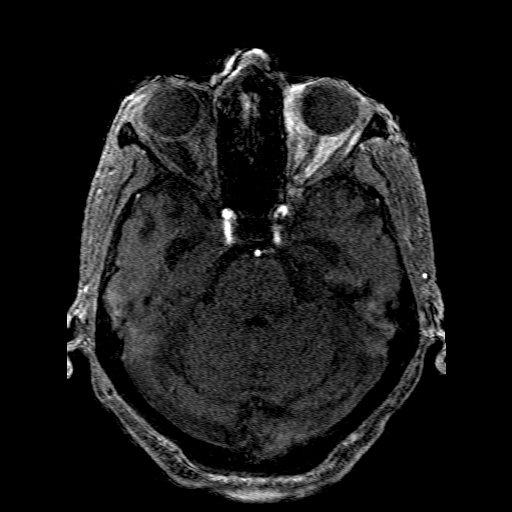
[im 78/176]
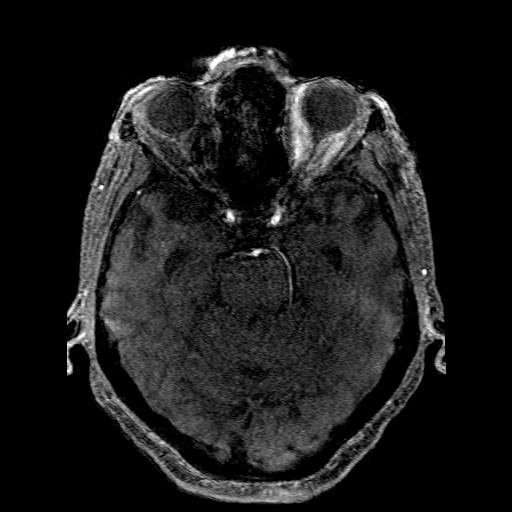
[im 85/176]
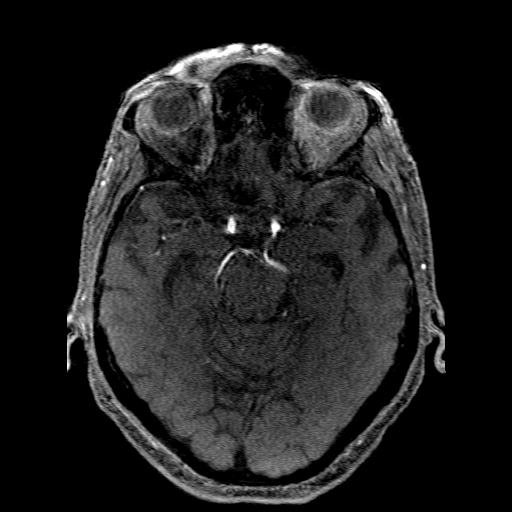
[im 92/176]
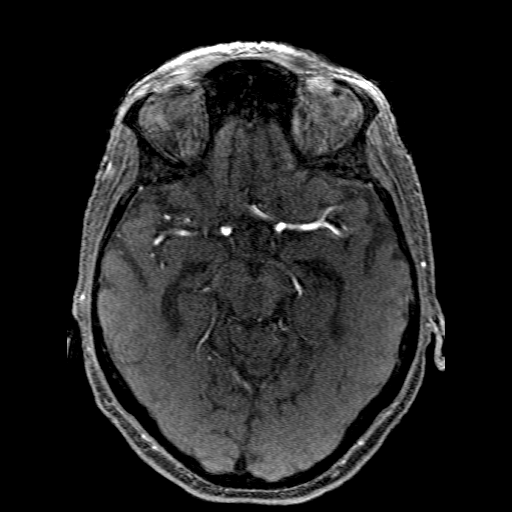
[im 99/176]
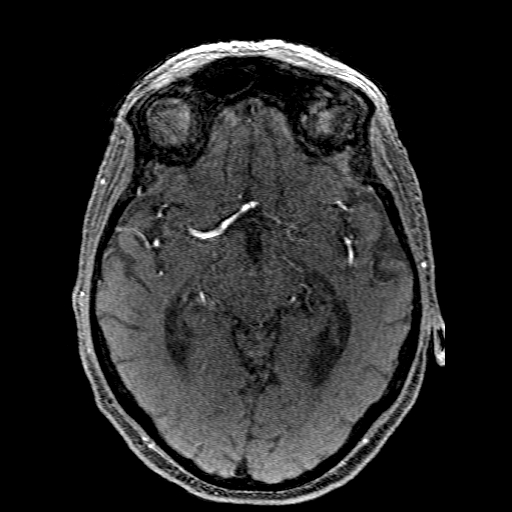
[im 106/176]
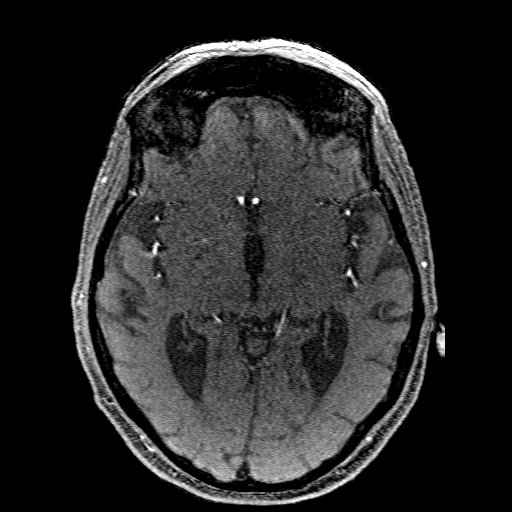
[im 120/176]
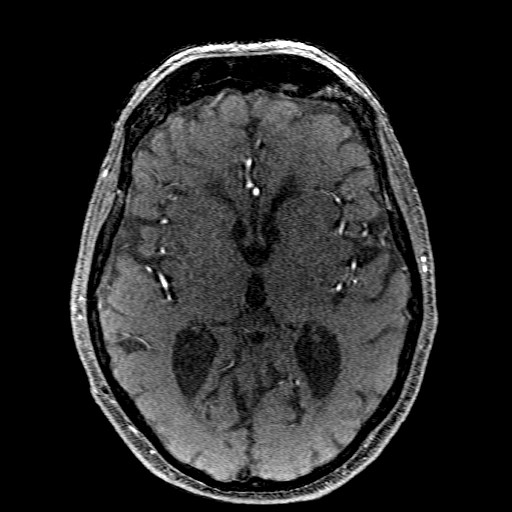
[im 148/176]
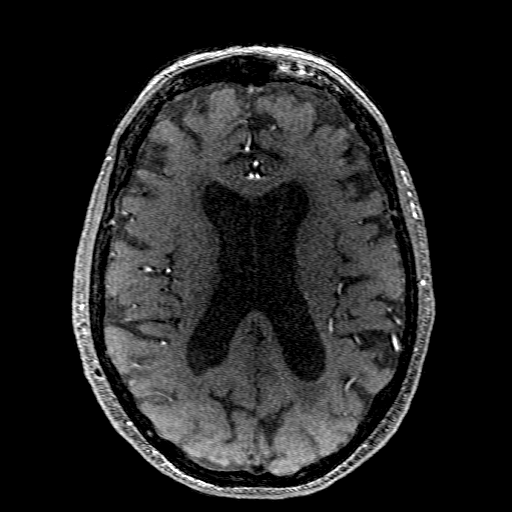
[im 169/176]
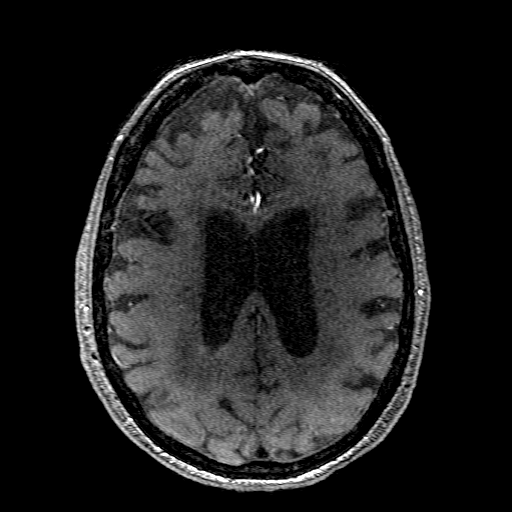

[19 of 48 positions shown; findings below may reference images not displayed]

FINDINGS: MRA NECK FINDINGS

Branching pattern is normal. No origin stenosis is seen. Both common
carotid arteries are widely patent to their respective bifurcation.
Both carotid bifurcations are patent without stenosis or
irregularity. Both cervical internal carotid arteries are widely
patent.

Both vertebral arteries show antegrade flow with the left being
dominant.

MRA HEAD FINDINGS

Both internal carotid arteries are widely patent into the brain. No
siphon stenosis. The anterior and middle cerebral vessels are patent
without proximal stenosis, aneurysm or vascular malformation.

Both vertebral arteries are widely patent through the skull base.
Right vertebral artery terminates in PICA. Left vertebral artery
supplies the basilar. No basilar stenosis. Posterior circulation
branch vessels appear normal.
IMPRESSION: Negative noncontrast MR angiography of the neck vessels and the
intracranial vasculature. No carotid stenosis. No intracranial large
vessel occlusion.

## 2023-07-30 IMAGING — MR MR HEAD W/O CM
6 of 10 series · 29 of 48 positions shown · non-contrast
Comparison: None Available.

CLINICAL DATA: Stroke, follow up

EXAM:
MRI HEAD WITHOUT CONTRAST
TECHNIQUE: Multiplanar, multiecho pulse sequences of the brain and surrounding
structures were obtained without intravenous contrast.

[Series 2: DWI · axial · 3.0mm · 0.94mm/px · z∈[-24,+116]mm · 9 of 95 slices shown (1 of 2)]
[im 1/95]
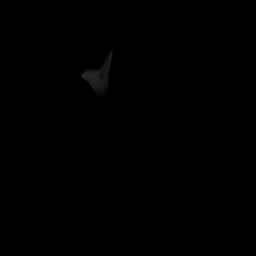
[im 12/95]
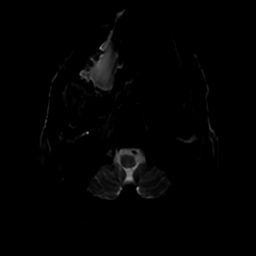
[im 24/95]
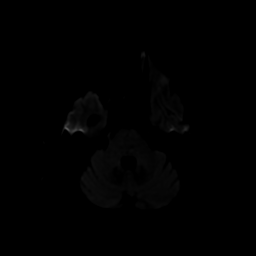
[im 36/95]
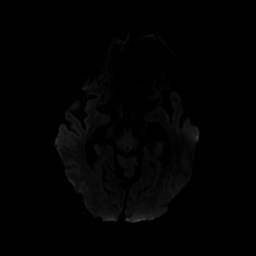
[im 48/95]
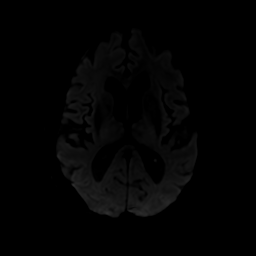
[im 59/95]
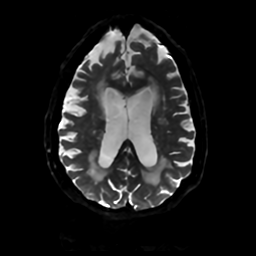
[im 71/95]
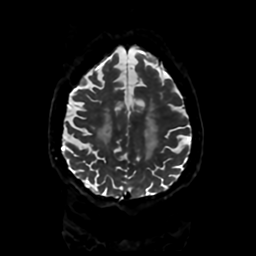
[im 83/95]
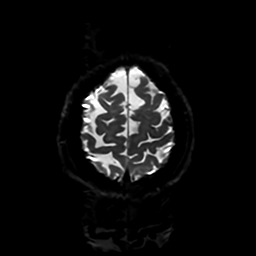
[im 95/95]
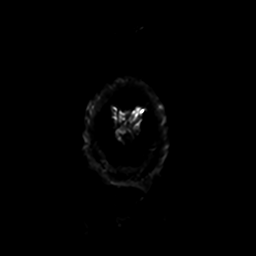

[Series 3: DWI · coronal · 4.0mm · 0.94mm/px · 7 of 72 slices shown (2 of 2)]
[im 1/72]
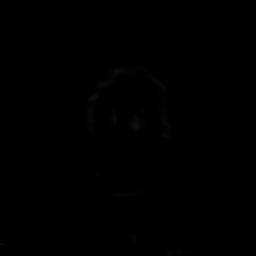
[im 12/72]
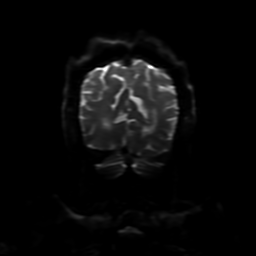
[im 24/72]
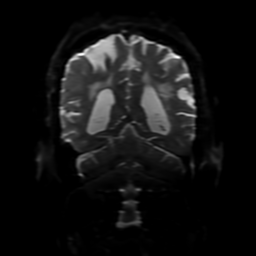
[im 36/72]
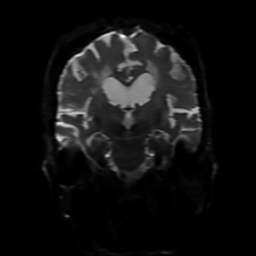
[im 48/72]
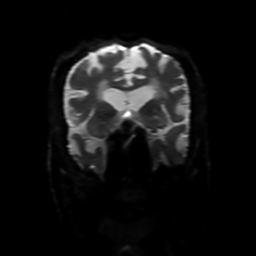
[im 60/72]
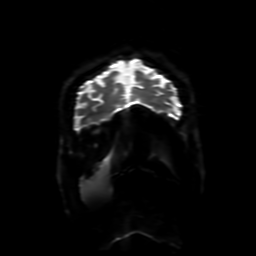
[im 72/72]
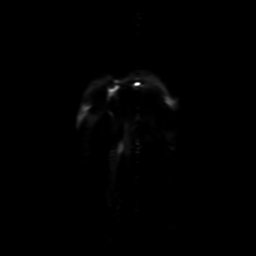

[Series 4: FLAIR · sagittal · 5.0mm · 0.23mm/px · 2 of 24 slices shown (1 of 2)]
[im 1/24]
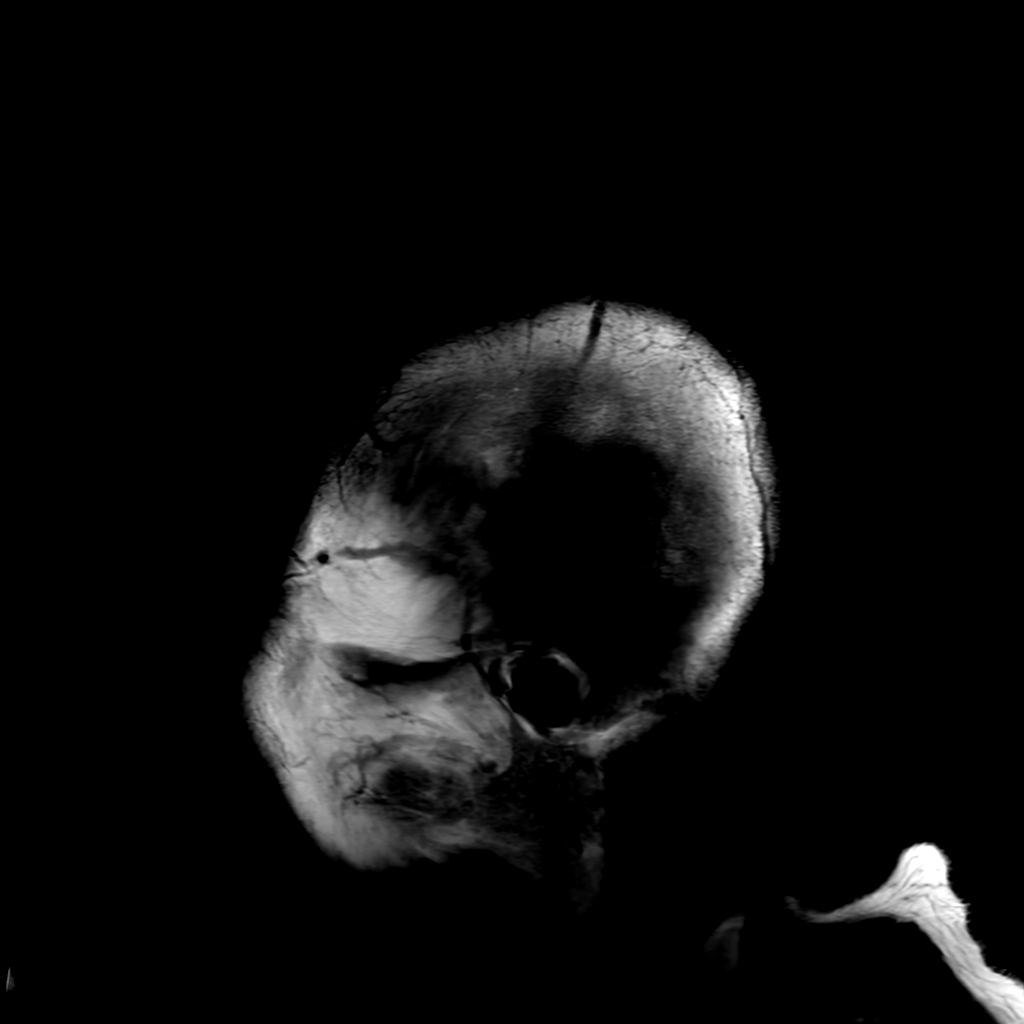
[im 24/24]
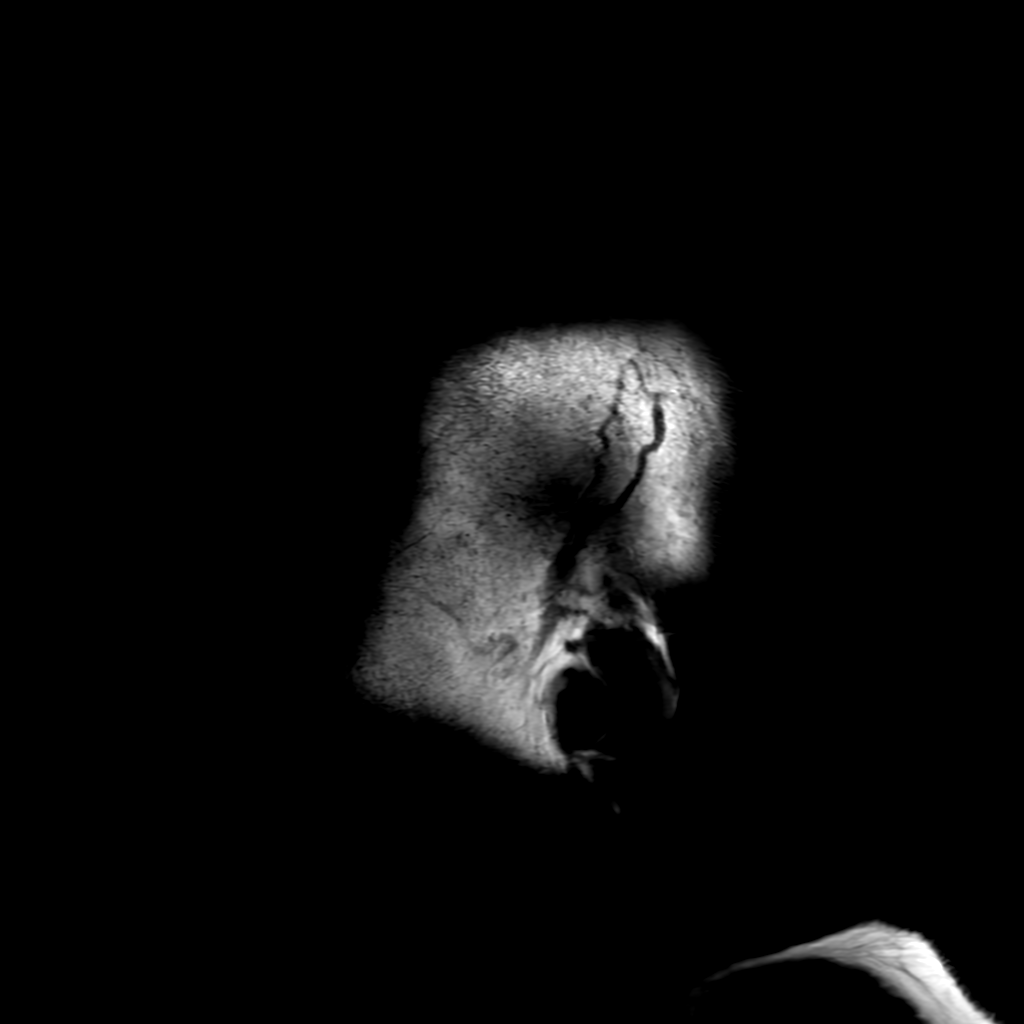

[Series 6: FLAIR · axial · 4.0mm · 0.45mm/px · z∈[-23,+116]mm · 3 of 33 slices shown (2 of 2)]
[im 1/33]
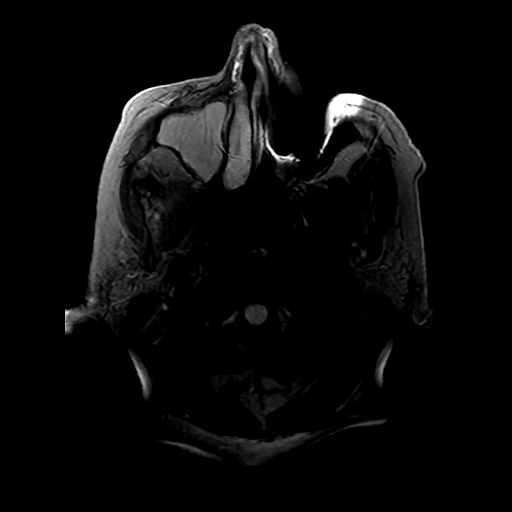
[im 17/33]
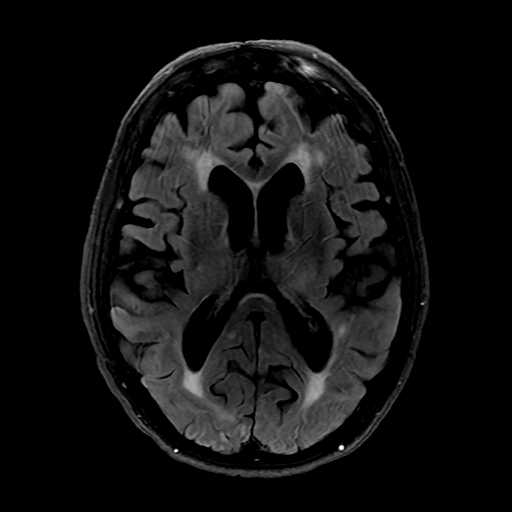
[im 33/33]
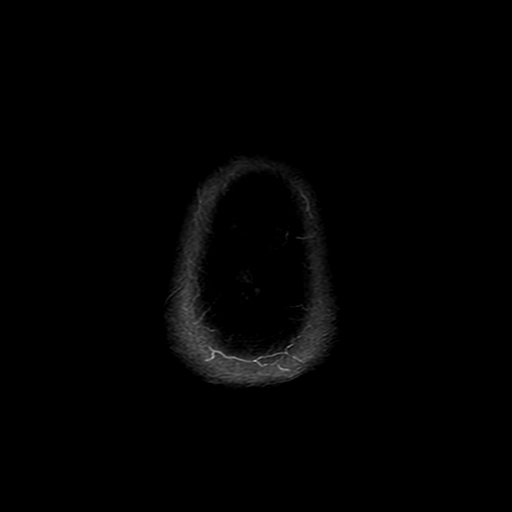

[Series 250: ADC · axial · 3.0mm · 0.94mm/px · z∈[-24,+116]mm · 5 of 48 slices shown (1 of 2)]
[im 1/48]
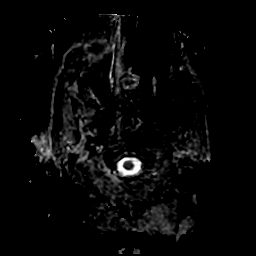
[im 12/48]
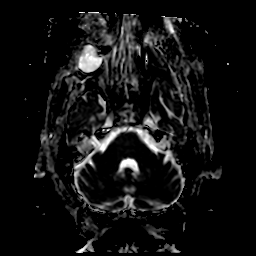
[im 24/48]
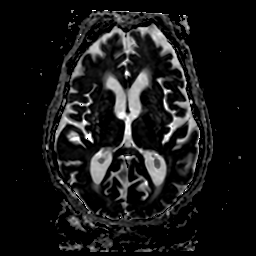
[im 36/48]
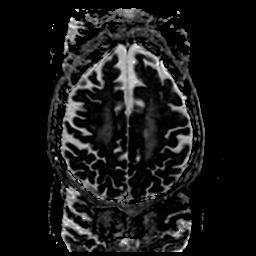
[im 48/48]
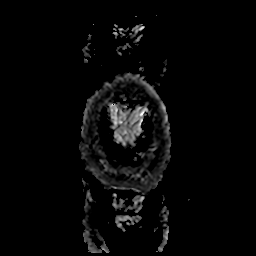

[Series 350: ADC · coronal · 4.0mm · 0.94mm/px · 3 of 36 slices shown (2 of 2)]
[im 1/36]
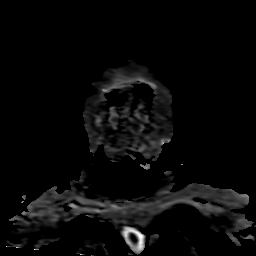
[im 18/36]
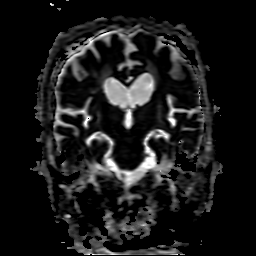
[im 36/36]
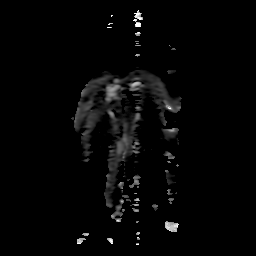

[29 of 48 positions shown; findings below may reference images not displayed]

FINDINGS: Brain: 1 cm focus of reduced diffusion within the left corona
radiata.

Few small foci of susceptibility involving the deep gray nuclei
likely reflecting hypertension related chronic microhemorrhages.
Probable small chronic right occipital infarct. Patchy and confluent
areas of T2 hyperintensity in the supratentorial greater than
pontine white matter are nonspecific but may reflect moderate
chronic microvascular ischemic changes. Prominence of the ventricles
and sulci reflects generalized parenchymal volume loss.

Vascular: Major vessel flow voids at the skull base are preserved.

Skull and upper cervical spine: Normal marrow signal is preserved.

Sinuses/Orbits: Chronic right maxillary sinusitis. Bilateral lens
replacements.

Other: Sella is unremarkable.  Minimal left mastoid opacification.
IMPRESSION: Small acute infarct of the left corona radiata.

Moderate chronic microvascular ischemic changes. Mild burden of
chronic microhemorrhages secondary to hypertension.

## 2023-08-03 DIAGNOSIS — H401133 Primary open-angle glaucoma, bilateral, severe stage: Secondary | ICD-10-CM | POA: Diagnosis not present

## 2023-08-08 NOTE — Therapy (Signed)
 OUTPATIENT PHYSICAL THERAPY TREATMENT   Patient Name: Laurie [name] MRN: [id number] DOB:10-01-1931, 89 y.o., female Today's Date: 08/09/2023  END OF SESSION:  PT End of Session - 08/09/23 1500     Visit Number 8    Number of Visits 17    Authorization Type [employer] [facility]    Authorization Time Period 12 visits approved 07/06/2023 - 08/17/2023    Authorization - Visit Number 6    Authorization - Number of Visits 10    PT Start Time 1500    PT Stop Time 1533    PT Time Calculation (min) 33 min    Equipment Utilized During Treatment Gait belt    Activity Tolerance Patient tolerated treatment well    Behavior During Therapy WFL for tasks assessed/performed                   Past Medical History:  Diagnosis Date   CKD (chronic kidney disease), stage III (HCC)    Family history of breast cancer    mother   Family history of colon cancer    father   Glaucoma    Osteopenia    SBO (small bowel obstruction) (HCC)    Past Surgical History:  Procedure Laterality Date   APPENDECTOMY     CESAREAN SECTION     x6   CORONARY STENT INTERVENTION N/A 06/08/2022   Procedure: CORONARY STENT INTERVENTION;  Surgeon: Burnard Debby LABOR, MD;  Location: [facility];  Service: Cardiovascular;  Laterality: N/A;   ESOPHAGOGASTRODUODENOSCOPY  07/07/2012   Procedure: ESOPHAGOGASTRODUODENOSCOPY (EGD);  Surgeon: Elsie Cree, MD;  Location: [facility] ENDOSCOPY;  Service: Endoscopy;  Laterality: N/A;   GLAUCOMA SURGERY Bilateral 02/17/2022   HIP SURGERY     2 rods were put on left hip   LEFT COLECTOMY  04/30/2011   Sr Streck   LEFT HEART CATH AND CORONARY ANGIOGRAPHY N/A 06/08/2022   Procedure: LEFT HEART CATH AND CORONARY ANGIOGRAPHY;  Surgeon: Burnard Debby LABOR, MD;  Location: [facility];  Service: Cardiovascular;  Laterality: N/A;   tubal cyst  04/30/2011   L fallopian tube cyst incidentally found   Patient Active Problem List   Diagnosis Date Noted   Non-ST elevation (NSTEMI)  myocardial infarction (HCC) 06/06/2022   Elevated troponin 06/05/2022   Hiatal hernia 06/05/2022   Elevated d-dimer 06/05/2022   Abnormal urinalysis 06/05/2022   HLD (hyperlipidemia) 06/05/2022   History of stroke 06/05/2022   Chronic kidney disease, stage 3b (HCC) 06/05/2022   Uncontrolled hypertension 12/30/2021   Glaucoma 12/29/2021   Acute ischemic stroke (HCC) 12/28/2021   Chronic pain of right knee 01/15/2020   Unilateral primary osteoarthritis, left knee 01/25/2018   Unilateral primary osteoarthritis, right knee 01/25/2018   Chronic pain of both knees 01/25/2018   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HCC) 12/22/2010    PCP: Okey Carlin Redbird, MD  REFERRING PROVIDER: Genelle Standing, MD   REFERRING DIAG: W19.XXXD (ICD-10-CM) - Fall, subsequent encounter (Referral comment: Gait training  Aquatic therapy)  Rationale for Evaluation and Treatment: Rehabilitation  THERAPY DIAG:  Unsteady gait  Difficulty in walking, not elsewhere classified  Abnormal posture  Muscle weakness (generalized)  ONSET DATE: 10/2022  SUBJECTIVE:  SUBJECTIVE STATEMENT: Patient's daughter, Laurie [name], reports that they had just been to the doctor because her R lower leg was swollen and painful. The doctor wrapped her leg. She denies pain and states that it just feels tight.    PERTINENT HISTORY:  CKD, osteopenia, SBO, hx MI, HTN, hx CVA June 2023  L hip ORIF medullary nailing s/p fall DC home health 10/2021.   PAIN:  Are you having pain: no resting , moderate pain while standing Location/description: NA Best-worst over past week: NA   - aggravating factors: NA  - Easing factors: NA     PRECAUTIONS: Right eye blindness, left eye glaucoma with impairment, cardiac hx, fall risk, osteopenia  WEIGHT BEARING  RESTRICTIONS: No  FALLS:  Has patient fallen in last 6 months? Yes. Number of falls 1  LIVING ENVIRONMENT: Lives with: lives with their son Lives in: House/apartment Stairs: Yes: External: 2 steps; on right going up Has following equipment at home: Quad cane large base, Environmental Consultant - 4 wheeled, and Wheelchair (manual)  OCCUPATION: Not  PLOF: Requires assistive device for independence, Needs assistance with ADLs, Needs assistance with homemaking, Needs assistance with gait, and Leisure: reads the newspaper, light cleaning, plants  Daughter reports the patient is independent with ambulation in her home using a quad cane but she is not without 24-hour supervision.   PATIENT GOALS: I want to be able to walk better and move like I used to be  NEXT MD VISIT: Multiple visits as needed  OBJECTIVE:   DIAGNOSTIC FINDINGS:     MRI brain (without) demonstrating: - Moderate atrophy and moderate chronic small vessel ischemic disease. - Few chronic cerebral microhemorrhages. - No acute findings. No change from 12/29/21.    COMPARISON:  01/29/2023   FINDINGS: Stable hardware is noted in the proximal left femur. No acute fracture or dislocation is noted. No soft tissue changes are seen. Significant degenerative changes about the left knee joint are noted.   IMPRESSION: Postsurgical changes in the left hip. Degenerative changes in the left knee. The overall appearance is stable from the prior exam.        SURVEYS:  NT on eval   SCREENING FOR RED FLAGS: Red flag questioning/screening reassuring NEG   COGNITION: Overall cognitive status: History of cognitive impairments - at baseline     SENSATION: WFL  POSTURE: Posterior leanrounded shoulders, forward head, increased thoracic kyphosis, flexed trunk , and posterior lean  PALPATION: N/A   LOWER EXTREMITY ROM:     Active  Right eval Left eval Rt./Lt.  06/22/23  Hip flexion     Hip extension     Ankle DF  Lacks 8 deg  Lacks 8  deg  Approx 10 deg bilateral   Hip external rotation     Knee extension   -15  Knee flexion   105  (Blank rows = not tested) (Key: WFL = within functional limits not formally assessed, * = concordant pain, s = stiffness/stretching sensation, NT = not tested)  Comments:    LOWER EXTREMITY MMT:    MMT Right eval Left eval Rt./lt. 06/22/23  Hip flexion 4 4 4   Hip abduction (modified sitting)     Hip internal rotation      Hip external rotation     Knee flexion 4+ 4+ 4+  Knee extension 4+ 4+ 4+  Ankle dorsiflexion   3-/5   (Blank rows = not tested) (Key: WFL = within functional limits not formally assessed, * = concordant pain, s = stiffness/stretching  sensation, NT = not tested)  Comments:     FUNCTIONAL TESTS:  5 times sit to stand: 28 sec  with min A and hands on the chair 2 minute walk test: 100 feet    06/22/23 2 min walk test 137 feet with rolling walker, min A     08/09/23 5xSTS with BUE 22.97 GAIT: Distance walked: 100  Assistive device utilized: Quad cane large base Level of assistance: Min A Comments: Posterior lean maximal cueing required to use the cane firmly on the ground, improved step length and heel strike  TODAY'S TREATMENT:       [facility] Adult PT Treatment:                                                DATE: 08/09/2023 Therapeutic Exercise: NuStep: level 1x 6 minutes -PT present to discuss progress 5XSTS  - 22.97 sec - pt struggles with first few reps and then does not fully stand without cueing to let go of chair and lean more fwd. STS with BUE in front of [place] for assist as needed 2x5 cues to shift wt forward Gait with SPC x 71 ft Seated:  Ball squeeze with LAQ : 5 hold x15,  Standing heel raises 2x5 Standing high march  x 10  Hip abduction  x 10   [facility] Adult PT Treatment:                                                DATE: 07/26/23 Therapeutic Exercise: NuStep: level 1x 6 minutes -PT present to discuss progress Seated:  Ball squeeze: 5 hold  x15,  Clam: blue loop added 2x10 March 1.5# added to ankles 2x10   hamstring curl: blue loop 2x10  LAQ 1.5# 2x10  Standing heel raises 2x5 Standing high march  x 10  Hip abduction  x 10    [facility] Adult PT Treatment:                                                DATE: 07/15/23 Therapeutic Exercise: Seated green band:  clam, march , hamstring curl and LAQ x 15 each  Standing heel raises x 10  Standing high march   x 10  Sidestepping 4 x 10 feet with cues  Hip abduction  x 10  Therapeutic Activity: Retro walking in parallel bars  Gait with RW 40 feet x 2    [facility] Adult PT Treatment:                                                DATE: 07/13/23 Therapeutic Exercise: Seated red band hip abduction 2x5 cues for pacing and posture, breath control  Seated heel raises x10 cues for improved heel contact on LLE LAQ + DF 2x8 BIL cues for ankle DF HEP update + education/handout, education on caregiver assist for standing activities  Therapeutic Activity: AP weight shifts w/ RW emphasis on foot contact x10 CGA and tactile  cues  Fwd step + weight shift 2x5 BIL w/ RW CGA  Lateral step + weight shift x6 BIL w RW CGA STS for each of above sets, does require cueing for UE sequencing and mechanics, tactile cues for improved fwd weight shift   PATIENT EDUCATION:  Education details: rationale for interventions, HEP  Person educated: Patient Education method: Explanation, Demonstration, Tactile cues, Verbal cues Education comprehension: verbalized understanding, returned demonstration, verbal cues required, tactile cues required, and needs further education     HOME EXERCISE PROGRAM: Access Code: [email or web address] URL: [email or web address] Date: 07/13/2023 Prepared by: Alm Jenny  Program Notes - with weight shifts, please use counter or rolling walker for upper body support, chair nearby for seated rest. would also recommend caregiver supervision/assist  Exercises - Seated Hip  Abduction  - 2-3 x daily - 1 sets - 8 reps - Seated Heel Toe Raises  - 2-3 x daily - 1 sets - 12 reps - Seated March  - 2-3 x daily - 1 sets - 5 reps - Seated Long Arc Quad  - 2-3 x daily - 1 sets - 8 reps - Side to Side Weight Shift with Counter Support  - 2-3 x daily - 1 sets - 5 reps  ASSESSMENT:  CLINICAL IMPRESSION: Patient was 15 min late today. She was at the doctor for R lower leg pain. She has swelling and a cut which the doctor bandaged Lower leg was wrapped with ACE bandage for swelling. Gait and sit to stand goals were assessed. Pt demonstrates difficulty with sit to stand and must use UE support to push off from chair. Additionally, she needs cueing to shift wt fwd in order to fully stand without falling bwd to chair. Gait with SPC required CGA and min A for avoiding obstacles. She was able to walk 71 ft before needing to sit. Transfer to chair was not safe after gait. She tried to just sit on the side of the chair rather than backing up to the chair and reaching back. With cueing she performed correctly which she had done previously in the session. Encouraged pt to work on sit to stands at home. Patient continues to demonstrate potential for improvement and would benefit from continued skilled therapy to address impairments.  .  PT monitored for safety and pain. Patient will benefit from skilled PT to address the below impairments and improve overall function.   OBJECTIVE IMPAIRMENTS: Abnormal gait, decreased activity tolerance, decreased balance, decreased cognition, decreased coordination, decreased endurance, decreased knowledge of condition, decreased knowledge of use of DME, decreased mobility, difficulty walking, decreased ROM, decreased strength, decreased safety awareness, increased edema, increased fascial restrictions, impaired perceived functional ability, impaired flexibility, impaired vision/preception, improper body mechanics, and postural dysfunction.   ACTIVITY LIMITATIONS:  carrying, lifting, bending, standing, squatting, stairs, transfers, bed mobility, and locomotion level  PARTICIPATION LIMITATIONS: meal prep, cleaning, shopping, and community activity  PERSONAL FACTORS: Age, Time since onset of injury/illness/exacerbation, and 3+ comorbidities: visual deficits, osteopenia, cognition  are also affecting patient's functional outcome.   REHAB POTENTIAL: Fair given severity of deficits, comorbidities  CLINICAL DECISION MAKING: Evolving/moderate complexity  EVALUATION COMPLEXITY: Moderate   GOALS: Goals reviewed with patient? No - on eval did discuss role of PT, PT POC  SHORT TERM GOALS: Target date: 07/20/2023   Pt will demonstrate appropriate understanding and performance of initially prescribed HEP in order to facilitate improved independence with management of symptoms.  Baseline: HEP provided on eval Goal status: ongoing   2. Pt will score  greater than or equal to NT on FOTO in order to demonstrate improved perception of function due to symptoms.  Baseline: NT   Goal status:DEFER   LONG TERM GOALS: Target date:08/17/2023   Pt will score NT on FOTO in order to demonstrate improved perception of functional status due to symptoms.  Baseline: NT  Goal status: INITIAL  2.  Pt will be able to perform sit to stand w/ safe mechanics and LRAD assistive device in order to facilitate improved safety w/ transfers. Baseline: needs min A  Goal status: INITIAL  3.  Pt will be able to ambulate 150 feet mod I  LRAD in order to facilitate improved safety w/ household navigation. Baseline: 100 feet min A quad cane 07/15/23 45-50 feet with close supervision and RW , needs A to avoid obstacles  Goal status:ongoing   4. Pt will demonstrate appropriate performance of final prescribed HEP in order to facilitate improved self-management of symptoms post-discharge.   Baseline: initial HEP prescribed  Goal status: INITIAL    5. Pt will demonstrate 5 x STS x 15 sec  in order to facilitate improved functional strength.  Baseline: 28 sec   Goal status: INITIAL   PLAN:  PT FREQUENCY: 1-2x/week  PT DURATION: 6 weeks  PLANNED INTERVENTIONS: Therapeutic exercises, Therapeutic activity, Neuromuscular re-education, Balance training, Gait training, Patient/Family education, Self Care, Joint mobilization, DME instructions, Aquatic Therapy, and Re-evaluation.  PLAN FOR NEXT SESSION:  Work on ROM/strength exercises as appropriate with emphasis on balance, ankle ROM general strength . Symptom modification strategies as indicated/appropriate.    Mliss Cummins, PT  08/09/23 4:29 PM

## 2023-08-09 ENCOUNTER — Ambulatory Visit: Payer: Medicare Other | Attending: Orthopaedic Surgery | Admitting: Physical Therapy

## 2023-08-09 ENCOUNTER — Encounter: Payer: Self-pay | Admitting: Physical Therapy

## 2023-08-09 DIAGNOSIS — R262 Difficulty in walking, not elsewhere classified: Secondary | ICD-10-CM | POA: Diagnosis not present

## 2023-08-09 DIAGNOSIS — R293 Abnormal posture: Secondary | ICD-10-CM | POA: Diagnosis not present

## 2023-08-09 DIAGNOSIS — R2681 Unsteadiness on feet: Secondary | ICD-10-CM | POA: Diagnosis not present

## 2023-08-09 DIAGNOSIS — J449 Chronic obstructive pulmonary disease, unspecified: Secondary | ICD-10-CM | POA: Diagnosis not present

## 2023-08-09 DIAGNOSIS — M6281 Muscle weakness (generalized): Secondary | ICD-10-CM | POA: Insufficient documentation

## 2023-08-09 DIAGNOSIS — R609 Edema, unspecified: Secondary | ICD-10-CM | POA: Diagnosis not present

## 2023-08-09 DIAGNOSIS — L97909 Non-pressure chronic ulcer of unspecified part of unspecified lower leg with unspecified severity: Secondary | ICD-10-CM | POA: Diagnosis not present

## 2023-08-09 DIAGNOSIS — M542 Cervicalgia: Secondary | ICD-10-CM | POA: Insufficient documentation

## 2023-08-09 DIAGNOSIS — I69354 Hemiplegia and hemiparesis following cerebral infarction affecting left non-dominant side: Secondary | ICD-10-CM | POA: Diagnosis not present

## 2023-08-09 DIAGNOSIS — N184 Chronic kidney disease, stage 4 (severe): Secondary | ICD-10-CM | POA: Diagnosis not present

## 2023-08-11 ENCOUNTER — Ambulatory Visit: Payer: Medicare Other

## 2023-08-11 DIAGNOSIS — M542 Cervicalgia: Secondary | ICD-10-CM

## 2023-08-11 DIAGNOSIS — R262 Difficulty in walking, not elsewhere classified: Secondary | ICD-10-CM | POA: Diagnosis not present

## 2023-08-11 DIAGNOSIS — M6281 Muscle weakness (generalized): Secondary | ICD-10-CM

## 2023-08-11 DIAGNOSIS — R2681 Unsteadiness on feet: Secondary | ICD-10-CM

## 2023-08-11 DIAGNOSIS — R293 Abnormal posture: Secondary | ICD-10-CM | POA: Diagnosis not present

## 2023-08-11 NOTE — Therapy (Signed)
 OUTPATIENT PHYSICAL THERAPY TREATMENT   Patient Name: Laurie [name] MRN: [id number] DOB:03-May-1932, 89 y.o., female Today's Date: 08/11/2023  END OF SESSION:  PT End of Session - 08/11/23 1309     Visit Number 9    Date for PT Re-Evaluation 08/17/23    Authorization Type [employer] MCR    Authorization Time Period 12 visits approved 07/06/2023 - 08/17/2023    Progress Note Due on Visit 10    PT Start Time 1221    PT Stop Time 1307    PT Time Calculation (min) 46 min    Activity Tolerance Patient tolerated treatment well    Behavior During Therapy WFL for tasks assessed/performed                    Past Medical History:  Diagnosis Date   CKD (chronic kidney disease), stage III (HCC)    Family history of breast cancer    mother   Family history of colon cancer    father   Glaucoma    Osteopenia    SBO (small bowel obstruction) (HCC)    Past Surgical History:  Procedure Laterality Date   APPENDECTOMY     CESAREAN SECTION     x6   CORONARY STENT INTERVENTION N/A 06/08/2022   Procedure: CORONARY STENT INTERVENTION;  Surgeon: Millicent Ally, MD;  Location: [facility];  Service: Cardiovascular;  Laterality: N/A;   ESOPHAGOGASTRODUODENOSCOPY  07/07/2012   Procedure: ESOPHAGOGASTRODUODENOSCOPY (EGD);  Surgeon: Evangeline Hilts, MD;  Location: [facility] ENDOSCOPY;  Service: Endoscopy;  Laterality: N/A;   GLAUCOMA SURGERY Bilateral 02/17/2022   HIP SURGERY     2 rods were put on left hip   LEFT COLECTOMY  04/30/2011   Sr Streck   LEFT HEART CATH AND CORONARY ANGIOGRAPHY N/A 06/08/2022   Procedure: LEFT HEART CATH AND CORONARY ANGIOGRAPHY;  Surgeon: Millicent Ally, MD;  Location: [facility];  Service: Cardiovascular;  Laterality: N/A;   tubal cyst  04/30/2011   L fallopian tube cyst incidentally found   Patient Active Problem List   Diagnosis Date Noted   Non-ST elevation (NSTEMI) myocardial infarction (HCC) 06/06/2022   Elevated troponin 06/05/2022    Hiatal hernia 06/05/2022   Elevated d-dimer 06/05/2022   Abnormal urinalysis 06/05/2022   HLD (hyperlipidemia) 06/05/2022   History of stroke 06/05/2022   Chronic kidney disease, stage 3b (HCC) 06/05/2022   Uncontrolled hypertension 12/30/2021   Glaucoma 12/29/2021   Acute ischemic stroke (HCC) 12/28/2021   Chronic pain of right knee 01/15/2020   Unilateral primary osteoarthritis, left knee 01/25/2018   Unilateral primary osteoarthritis, right knee 01/25/2018   Chronic pain of both knees 01/25/2018   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HCC) 12/22/2010    PCP: Jimmey Mould, MD  REFERRING PROVIDER: Wilhelmenia Harada, MD   REFERRING DIAG: W19.XXXD (ICD-10-CM) - Fall, subsequent encounter (Referral comment: "Gait training  Aquatic therapy")  Rationale for Evaluation and Treatment: Rehabilitation  THERAPY DIAG:  Unsteady gait  Difficulty in walking, not elsewhere classified  Abnormal posture  Muscle weakness (generalized)  Cervicalgia  ONSET DATE: 10/2022  SUBJECTIVE:  SUBJECTIVE STATEMENT: I was tired after last session but not too bad.  My Rt leg is feeling ok, I will unwrap it today.   PERTINENT HISTORY:  CKD, osteopenia, SBO, hx MI, HTN, hx CVA June 2023  L hip ORIF medullary nailing s/p fall DC home health 10/2021.   PAIN:  Are you having pain: no resting , moderate pain while standing Location/description: NA Best-worst over past week: NA   - aggravating factors: NA  - Easing factors: NA     PRECAUTIONS: Right eye blindness, left eye glaucoma with impairment, cardiac hx, fall risk, osteopenia  WEIGHT BEARING RESTRICTIONS: No  FALLS:  Has patient fallen in last 6 months? Yes. Number of falls 1  LIVING ENVIRONMENT: Lives with: lives with their son Lives in:  House/apartment Stairs: Yes: External: 2 steps; on right going up Has following equipment at home: Quad cane large base, Environmental consultant - 4 wheeled, and Wheelchair (manual)  OCCUPATION: Not  PLOF: Requires assistive device for independence, Needs assistance with ADLs, Needs assistance with homemaking, Needs assistance with gait, and Leisure: reads the newspaper, light cleaning, plants  Daughter reports the patient is independent with ambulation in her home using a quad cane but she is not without 24-hour supervision.   PATIENT GOALS: I want to be able to walk better and move like I used to be  NEXT MD VISIT: Multiple visits as needed  OBJECTIVE:   DIAGNOSTIC FINDINGS:     MRI brain (without) demonstrating: - Moderate atrophy and moderate chronic small vessel ischemic disease. - Few chronic cerebral microhemorrhages. - No acute findings. No change from 12/29/21.    COMPARISON:  01/29/2023   FINDINGS: Stable hardware is noted in the proximal left femur. No acute fracture or dislocation is noted. No soft tissue changes are seen. Significant degenerative changes about the left knee joint are noted.   IMPRESSION: Postsurgical changes in the left hip. Degenerative changes in the left knee. The overall appearance is stable from the prior exam.        SURVEYS:  NT on eval   SCREENING FOR RED FLAGS: Red flag questioning/screening reassuring NEG   COGNITION: Overall cognitive status: History of cognitive impairments - at baseline     SENSATION: WFL  POSTURE: Posterior leanrounded shoulders, forward head, increased thoracic kyphosis, flexed trunk , and posterior lean  PALPATION: N/A   LOWER EXTREMITY ROM:     Active  Right eval Left eval Rt./Lt.  06/22/23  Hip flexion     Hip extension     Ankle DF  Lacks 8 deg  Lacks 8 deg  Approx 10 deg bilateral   Hip external rotation     Knee extension   -15  Knee flexion   105  (Blank rows = not tested) (Key: WFL = within  functional limits not formally assessed, * = concordant pain, s = stiffness/stretching sensation, NT = not tested)  Comments:    LOWER EXTREMITY MMT:    MMT Right eval Left eval Rt./lt. 06/22/23  Hip flexion 4 4 4   Hip abduction (modified sitting)     Hip internal rotation      Hip external rotation     Knee flexion 4+ 4+ 4+  Knee extension 4+ 4+ 4+  Ankle dorsiflexion   3-/5   (Blank rows = not tested) (Key: WFL = within functional limits not formally assessed, * = concordant pain, s = stiffness/stretching sensation, NT = not tested)  Comments:     FUNCTIONAL TESTS:  5  times sit to stand: 28 sec  with min A and hands on the chair 2 minute walk test: 100 feet    06/22/23 2 min walk test 137 feet with rolling walker, min A     08/09/23 5xSTS with BUE 22.97 GAIT: Distance walked: 100  Assistive device utilized: Quad cane large base Level of assistance: Min A Comments: Posterior lean maximal cueing required to use the cane firmly on the ground, improved step length and heel strike  TODAY'S TREATMENT:      [facility] Adult PT Treatment:                                                DATE: 08/11/2023 Therapeutic Exercise: NuStep: level 1x 8 minutes -PT present to discuss progress Seated: cross punches with 1# weights, overhead press with 1# weights x10 each  Seated:  Ball squeeze with LAQ : 5" hold x15 Seated march 2x10 Hamstring curls with yellow loop 2x10 Hip abduction with yellow loop 2x10 Standing heel raises 2x5 Standing high march  x 10  Hip extension  x 10  Standing shoulder extension with yellow band 2x10- close CGA by PT for safety  [facility] Adult PT Treatment:                                                DATE: 08/09/2023 Therapeutic Exercise: NuStep: level 1x 6 minutes -PT present to discuss progress 5XSTS  - 22.97 sec - pt struggles with first few reps and then does not fully stand without cueing to let go of chair and lean more fwd. STS with BUE in front of [place] for  assist as needed 2x5 cues to shift wt forward Gait with SPC x 71 ft Seated:  Ball squeeze with LAQ : 5" hold x15,  Standing heel raises 2x5 Standing high march  x 10  Hip abduction  x 10   [facility] Adult PT Treatment:                                                DATE: 07/26/23 Therapeutic Exercise: NuStep: level 1x 6 minutes -PT present to discuss progress Seated:  Ball squeeze: 5" hold x15,  Clam: blue loop added 2x10 March 1.5# added to ankles 2x10   hamstring curl: blue loop 2x10  LAQ 1.5# 2x10  Standing heel raises 2x5 Standing high march  x 10  Hip abduction  x 10   PATIENT EDUCATION:  Education details: rationale for interventions, HEP  Person educated: Patient Education method: Explanation, Demonstration, Tactile cues, Verbal cues Education comprehension: verbalized understanding, returned demonstration, verbal cues required, tactile cues required, and needs further education     HOME EXERCISE PROGRAM: Access Code: [email or web address] URL: [email or web address] Date: 07/13/2023 Prepared by: Mayme Spearman  Program Notes - with weight shifts, please use counter or rolling walker for upper body support, chair nearby for seated rest. would also recommend caregiver supervision/assist  Exercises - Seated Hip Abduction  - 2-3 x daily - 1 sets - 8 reps - Seated Heel Toe Raises  - 2-3 x daily - 1 sets - 12  reps - Seated March  - 2-3 x daily - 1 sets - 5 reps - Seated Long Arc Quad  - 2-3 x daily - 1 sets - 8 reps - Side to Side Weight Shift with Counter Support  - 2-3 x daily - 1 sets - 5 reps  ASSESSMENT:  CLINICAL IMPRESSION: Pt did well with increased time on the NuStep today and PT monitored throughout. Pt fatigues quickly and requires rest breaks and continues to be challenged by current level of exercise.  She required supervision and close guard for safety.  Patient continues to demonstrate potential for improvement and would benefit from continued skilled therapy  to address impairments.  PT monitored for safety and pain. Patient will benefit from skilled PT to address the below impairments and improve overall function.   OBJECTIVE IMPAIRMENTS: Abnormal gait, decreased activity tolerance, decreased balance, decreased cognition, decreased coordination, decreased endurance, decreased knowledge of condition, decreased knowledge of use of DME, decreased mobility, difficulty walking, decreased ROM, decreased strength, decreased safety awareness, increased edema, increased fascial restrictions, impaired perceived functional ability, impaired flexibility, impaired vision/preception, improper body mechanics, and postural dysfunction.   ACTIVITY LIMITATIONS: carrying, lifting, bending, standing, squatting, stairs, transfers, bed mobility, and locomotion level  PARTICIPATION LIMITATIONS: meal prep, cleaning, shopping, and community activity  PERSONAL FACTORS: Age, Time since onset of injury/illness/exacerbation, and 3+ comorbidities: visual deficits, osteopenia, cognition  are also affecting patient's functional outcome.   REHAB POTENTIAL: Fair given severity of deficits, comorbidities  CLINICAL DECISION MAKING: Evolving/moderate complexity  EVALUATION COMPLEXITY: Moderate   GOALS: Goals reviewed with patient? No - on eval did discuss role of PT, PT POC  SHORT TERM GOALS: Target date: 07/20/2023   Pt will demonstrate appropriate understanding and performance of initially prescribed HEP in order to facilitate improved independence with management of symptoms.  Baseline: HEP provided on eval Goal status: ongoing   2. Pt will score greater than or equal to NT on FOTO in order to demonstrate improved perception of function due to symptoms.  Baseline: NT   Goal status:DEFER   LONG TERM GOALS: Target date:08/17/2023   Pt will score NT on FOTO in order to demonstrate improved perception of functional status due to symptoms.  Baseline: NT  Goal status:  INITIAL  2.  Pt will be able to perform sit to stand w/ safe mechanics and LRAD assistive device in order to facilitate improved safety w/ transfers. Baseline: needs min A  Goal status: INITIAL  3.  Pt will be able to ambulate 150 feet mod I  LRAD in order to facilitate improved safety w/ household navigation. Baseline: 100 feet min A quad cane 07/15/23 45-50 feet with close supervision and RW , needs A to avoid obstacles  Goal status:ongoing   4. Pt will demonstrate appropriate performance of final prescribed HEP in order to facilitate improved self-management of symptoms post-discharge.   Baseline: initial HEP prescribed  Goal status: INITIAL    5. Pt will demonstrate 5 x STS x 15 sec in order to facilitate improved functional strength.  Baseline: 28 sec   Goal status: INITIAL   PLAN:  PT FREQUENCY: 1-2x/week  PT DURATION: 6 weeks  PLANNED INTERVENTIONS: Therapeutic exercises, Therapeutic activity, Neuromuscular re-education, Balance training, Gait training, Patient/Family education, Self Care, Joint mobilization, DME instructions, Aquatic Therapy, and Re-evaluation.  PLAN FOR NEXT SESSION:  Strength, balance and endurance progression as able   Luella Sager, PT 08/11/23 1:10 PM

## 2023-08-13 ENCOUNTER — Ambulatory Visit (HOSPITAL_BASED_OUTPATIENT_CLINIC_OR_DEPARTMENT_OTHER): Payer: Medicare Other | Admitting: Orthopaedic Surgery

## 2023-08-13 DIAGNOSIS — M25562 Pain in left knee: Secondary | ICD-10-CM

## 2023-08-13 DIAGNOSIS — M170 Bilateral primary osteoarthritis of knee: Secondary | ICD-10-CM | POA: Diagnosis not present

## 2023-08-13 DIAGNOSIS — M25561 Pain in right knee: Secondary | ICD-10-CM | POA: Diagnosis not present

## 2023-08-13 MED ORDER — TRIAMCINOLONE ACETONIDE 40 MG/ML IJ SUSP
80.0000 mg | INTRAMUSCULAR | Status: AC | PRN
Start: 2023-08-13 — End: 2023-08-13
  Administered 2023-08-13: 80 mg via INTRA_ARTICULAR

## 2023-08-13 MED ORDER — LIDOCAINE HCL 1 % IJ SOLN
4.0000 mL | INTRAMUSCULAR | Status: AC | PRN
Start: 2023-08-13 — End: 2023-08-13
  Administered 2023-08-13: 4 mL

## 2023-08-13 NOTE — Progress Notes (Signed)
Post Operative Evaluation    Procedure/Date of Surgery: Status post left hip cephalomedullary nail 4/27  Interval History:   Presents today status post left hip cephalomedullary nailing.  She has been having a very hard time mobilizing out of her wheelchair.  She is having some residual weakness and left knee pain.  She get relief from a left knee injection at the last visit.  She is still having persistent bilateral knee pain and crepitus in the setting of known bilateral knee osteoarthritis.  Unfortunately she did recently have a fall and landed on both knees   PMH/PSH/Family History/Social History/Meds/Allergies:    Past Medical History:  Diagnosis Date  . CKD (chronic kidney disease), stage III (HCC)   . Family history of breast cancer    mother  . Family history of colon cancer    father  . Glaucoma   . Osteopenia   . SBO (small bowel obstruction) (HCC)    Past Surgical History:  Procedure Laterality Date  . APPENDECTOMY    . CESAREAN SECTION     x6  . CORONARY STENT INTERVENTION N/A 06/08/2022   Procedure: CORONARY STENT INTERVENTION;  Surgeon: Lennette Bihari, MD;  Location: [facility];  Service: Cardiovascular;  Laterality: N/A;  . ESOPHAGOGASTRODUODENOSCOPY  07/07/2012   Procedure: ESOPHAGOGASTRODUODENOSCOPY (EGD);  Surgeon: Willis Modena, MD;  Location: [facility] ENDOSCOPY;  Service: Endoscopy;  Laterality: N/A;  . GLAUCOMA SURGERY Bilateral 02/17/2022  . HIP SURGERY     2 rods were put on left hip  . LEFT COLECTOMY  04/30/2011   Sr Streck  . LEFT HEART CATH AND CORONARY ANGIOGRAPHY N/A 06/08/2022   Procedure: LEFT HEART CATH AND CORONARY ANGIOGRAPHY;  Surgeon: Lennette Bihari, MD;  Location: [facility];  Service: Cardiovascular;  Laterality: N/A;  . tubal cyst  04/30/2011   L fallopian tube cyst incidentally found   Social History   Socioeconomic History  . Marital status: Widowed    Spouse name: Not on file  .  Number of children: Not on file  . Years of education: Not on file  . Highest education level: Not on file  Occupational History  . Not on file  Tobacco Use  . Smoking status: Former    Current packs/day: 0.50    Types: Cigarettes  . Smokeless tobacco: Not on file  Vaping Use  . Vaping status: Not on file  Substance and Sexual Activity  . Alcohol use: No    Comment: rare  . Drug use: No  . Sexual activity: Not Currently  Other Topics Concern  . Not on file  Social History Narrative  . Not on file   Social Drivers of Health   Financial Resource Strain: Low Risk  (11/20/2022)   Received from [facility], [facility]   Overall Financial Resource Strain (CARDIA)   . Difficulty of Paying Living Expenses: Not hard at all  Food Insecurity: No Food Insecurity (11/20/2022)   Received from [facility], [facility]   Hunger Vital Sign   . Worried About Programme researcher, broadcasting/film/video in the Last Year: Never true   . Ran Out of Food in the Last Year: Never true  Transportation Needs: No Transportation Needs (11/20/2022)   Received from [facility],  [facility]   PRAPARE - Transportation   . Lack of Transportation (Medical): No   . Lack of Transportation (Non-Medical): No  Physical Activity: Not on file  Stress: Not on file  Social Connections: Unknown (01/07/2022)   Received from [facility], [facility]   Social Network   . Social Network: Not on file   Family History  Problem Relation Age of Onset  . Breast cancer Mother   . Colon cancer Father   . Diabetes Sister    Allergies  Allergen Reactions  . Actonel [Risedronate] Nausea And Vomiting  . Fosamax [Alendronate] Nausea And Vomiting  . Miacalcin Other (See Comments)    Unknown reaction  . Neomycin Other (See Comments)    Unknown reaction  . Neosporin [Neomycin-Polymyxin-Gramicidin] Swelling  . Neurontin [Gabapentin] Other (See  Comments)    Confusion  . Codeine Palpitations   Current Outpatient Medications  Medication Sig Dispense Refill  . acetaminophen (TYLENOL) 500 MG tablet Take 1,000 mg by mouth every 6 (six) hours as needed.    Marland Kitchen amLODipine (NORVASC) 2.5 MG tablet Take 2.5 mg by mouth daily.    Marland Kitchen aspirin EC 81 MG tablet Take 1 tablet (81 mg total) by mouth daily. Swallow whole. 30 tablet 12  . atorvastatin (LIPITOR) 40 MG tablet Take 1 tablet (40 mg total) by mouth daily. 30 tablet 11  . brimonidine (ALPHAGAN) 0.2 % ophthalmic solution Place 1 drop into both eyes 3 (three) times daily.    . dorzolamide-timolol (COSOPT) 22.3-6.8 MG/ML ophthalmic solution 1 drop 2 (two) times daily.      Marland Kitchen latanoprost (XALATAN) 0.005 % ophthalmic solution Place 1 drop into both eyes every evening. (Patient not taking: Reported on 04/05/2023)    . methocarbamol (ROBAXIN) 500 MG tablet Take 1 tablet (500 mg total) by mouth 3 (three) times daily. (Patient not taking: Reported on 04/05/2023) 30 tablet 3  . metoprolol tartrate (LOPRESSOR) 25 MG tablet Take 0.5 tablets (12.5 mg total) by mouth 2 (two) times daily. 60 tablet 2  . nitroGLYCERIN (NITROSTAT) 0.4 MG SL tablet Place 1 tablet (0.4 mg total) under the tongue every 5 (five) minutes as needed for chest pain. 30 tablet 12  . OPTIVE 0.5-0.9 % ophthalmic solution Place 1 drop into both eyes 4 (four) times daily as needed for dry eyes. (Patient not taking: Reported on 04/05/2023)    . pantoprazole (PROTONIX) 40 MG tablet Take 1 tablet (40 mg total) by mouth daily. (Patient not taking: Reported on 04/05/2023) 30 tablet 0  . RHOPRESSA 0.02 % SOLN Place 1 drop into both eyes every evening.    . ticagrelor (BRILINTA) 90 MG TABS tablet Take 1 tablet (90 mg total) by mouth 2 (two) times daily. 180 tablet 3   No current facility-administered medications for this visit.   No results found.  Review of Systems:   A ROS was performed including pertinent positives and negatives as documented in the  HPI.   Musculoskeletal Exam:    There were no vitals taken for this visit.  Left hip wounds are well-appearing without erythema or drainage.  Able to actively flex at the left hip.  Sitting in a wheelchair.  30 degrees internal/external rotation of the hip without pain  Left knee with crepitus and pain from 0 to 90 degrees of flexion.  There is significant pain tricompartmentally.  Small effusion  There is some bruising with abrasions about the right and left knee.  These do not appear to be red  or swollen  Imaging:    3 views left hip: No evidence of hardware failure  I personally reviewed and interpreted the radiographs.   Assessment:   status post left hip cephalomedullary nailing overall doing well.  At this time I recommended bilateral ultrasound-guided injection of the knee hopefully get her some relief from arthritis.  I will plan to see her back as needed Plan :    -Bilateral knee ultrasound-guided injection provided after verbal consent obtained    Procedure Note  Patient: Laurie [name]             Date of Birth: 01-Jul-1932           MRN: [id number]             Visit Date: 08/13/2023  Procedures: Visit Diagnoses: No diagnosis found.  Large Joint Inj: R knee on 08/13/2023 2:30 PM Indications: pain Details: 22 G 1.5 in needle, ultrasound-guided anterior approach  Arthrogram: No  Medications: 4 mL lidocaine 1 %; 80 mg triamcinolone acetonide 40 MG/ML Outcome: tolerated well, no immediate complications Procedure, treatment alternatives, risks and benefits explained, specific risks discussed. Consent was given by the patient. Immediately prior to procedure a time out was called to verify the correct patient, procedure, equipment, support staff and site/side marked as required. Patient was prepped and draped in the usual sterile fashion.    Large Joint Inj: L knee on 08/13/2023 2:30 PM Indications: pain Details: 22 G 1.5 in needle, ultrasound-guided anterior  approach  Arthrogram: No  Medications: 4 mL lidocaine 1 %; 80 mg triamcinolone acetonide 40 MG/ML Outcome: tolerated well, no immediate complications Procedure, treatment alternatives, risks and benefits explained, specific risks discussed. Consent was given by the patient. Immediately prior to procedure a time out was called to verify the correct patient, procedure, equipment, support staff and site/side marked as required. Patient was prepped and draped in the usual sterile fashion.        I personally saw and evaluated the patient, and participated in the management and treatment plan.  Huel Cote, MD Attending Physician, Orthopedic Surgery  This document was dictated using Dragon voice recognition software. A reasonable attempt at proof reading has been made to minimize errors.

## 2023-08-17 ENCOUNTER — Ambulatory Visit: Payer: Medicare Other

## 2023-08-17 DIAGNOSIS — R293 Abnormal posture: Secondary | ICD-10-CM | POA: Diagnosis not present

## 2023-08-17 DIAGNOSIS — R262 Difficulty in walking, not elsewhere classified: Secondary | ICD-10-CM | POA: Diagnosis not present

## 2023-08-17 DIAGNOSIS — M6281 Muscle weakness (generalized): Secondary | ICD-10-CM

## 2023-08-17 DIAGNOSIS — M542 Cervicalgia: Secondary | ICD-10-CM | POA: Diagnosis not present

## 2023-08-17 DIAGNOSIS — R2681 Unsteadiness on feet: Secondary | ICD-10-CM | POA: Diagnosis not present

## 2023-08-17 NOTE — Therapy (Signed)
OUTPATIENT PHYSICAL THERAPY TREATMENT   Patient Name: Laurie [name] MRN: [id number] DOB:05/07/1932, 89 y.o., female Today's Date: 08/17/2023 Progress Note Reporting Period 04/21/24 to 08/17/23  See note below for Objective Data and Assessment of Progress/Goals.     END OF SESSION:  PT End of Session - 08/17/23 1325     Visit Number 10    Date for PT Re-Evaluation 10/12/23    Authorization Type [employer] MCR    Authorization Time Period 12 visits approved 07/06/2023 - 08/17/2023- requested more visitis 08/17/23    Authorization - Visit Number 7    Authorization - Number of Visits 12    Progress Note Due on Visit 20    PT Start Time 1245   late   PT Stop Time 1315    PT Time Calculation (min) 30 min    Activity Tolerance Patient tolerated treatment well    Behavior During Therapy WFL for tasks assessed/performed                     Past Medical History:  Diagnosis Date   CKD (chronic kidney disease), stage III (HCC)    Family history of breast cancer    mother   Family history of colon cancer    father   Glaucoma    Osteopenia    SBO (small bowel obstruction) (HCC)    Past Surgical History:  Procedure Laterality Date   APPENDECTOMY     CESAREAN SECTION     x6   CORONARY STENT INTERVENTION N/A 06/08/2022   Procedure: CORONARY STENT INTERVENTION;  Surgeon: Lennette Bihari, MD;  Location: [facility];  Service: Cardiovascular;  Laterality: N/A;   ESOPHAGOGASTRODUODENOSCOPY  07/07/2012   Procedure: ESOPHAGOGASTRODUODENOSCOPY (EGD);  Surgeon: Willis Modena, MD;  Location: [facility];  Service: Endoscopy;  Laterality: N/A;   GLAUCOMA SURGERY Bilateral 02/17/2022   HIP SURGERY     2 rods were put on left hip   LEFT COLECTOMY  04/30/2011   Sr Streck   LEFT HEART CATH AND CORONARY ANGIOGRAPHY N/A 06/08/2022   Procedure: LEFT HEART CATH AND CORONARY ANGIOGRAPHY;  Surgeon: Lennette Bihari, MD;  Location: [facility];  Service: Cardiovascular;   Laterality: N/A;   tubal cyst  04/30/2011   L fallopian tube cyst incidentally found   Patient Active Problem List   Diagnosis Date Noted   Non-ST elevation (NSTEMI) myocardial infarction (HCC) 06/06/2022   Elevated troponin 06/05/2022   Hiatal hernia 06/05/2022   Elevated d-dimer 06/05/2022   Abnormal urinalysis 06/05/2022   HLD (hyperlipidemia) 06/05/2022   History of stroke 06/05/2022   Chronic kidney disease, stage 3b (HCC) 06/05/2022   Uncontrolled hypertension 12/30/2021   Glaucoma 12/29/2021   Acute ischemic stroke (HCC) 12/28/2021   Chronic pain of right knee 01/15/2020   Unilateral primary osteoarthritis, left knee 01/25/2018   Unilateral primary osteoarthritis, right knee 01/25/2018   Chronic pain of both knees 01/25/2018   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HCC) 12/22/2010    PCP: Daisy Floro, MD  REFERRING PROVIDER: Huel Cote, MD   REFERRING DIAG: W19.XXXD (ICD-10-CM) - Fall, subsequent encounter (Referral comment: "Gait training  Aquatic therapy")  Rationale for Evaluation and Treatment: Rehabilitation  THERAPY DIAG:  Unsteady gait - Plan: PT plan of care cert/re-cert  Difficulty in walking, not elsewhere classified - Plan: PT plan of care cert/re-cert  Abnormal posture - Plan: PT plan of care cert/re-cert  Muscle weakness (generalized) - Plan: PT plan of care cert/re-cert  ONSET DATE: 10/2022  SUBJECTIVE:                                                                                                                                                                                           SUBJECTIVE STATEMENT: I saw the MD and he did injections in both knees.  PT is helping me to walk better.  Pt 15 min late for appt.    PERTINENT HISTORY:  CKD, osteopenia, SBO, hx MI, HTN, hx CVA June 2023  L hip ORIF medullary nailing s/p fall DC home health 10/2021.   PAIN:  Are you having pain: no resting , moderate pain while standing Location/description:  NA Best-worst over past week: NA   - aggravating factors: NA  - Easing factors: NA     PRECAUTIONS: Right eye blindness, left eye glaucoma with impairment, cardiac hx, fall risk, osteopenia  WEIGHT BEARING RESTRICTIONS: No  FALLS:  Has patient fallen in last 6 months? Yes. Number of falls 1  LIVING ENVIRONMENT: Lives with: lives with their son Lives in: House/apartment Stairs: Yes: External: 2 steps; on right going up Has following equipment at home: Quad cane large base, Environmental consultant - 4 wheeled, and Wheelchair (manual)  OCCUPATION: Not  PLOF: Requires assistive device for independence, Needs assistance with ADLs, Needs assistance with homemaking, Needs assistance with gait, and Leisure: reads the newspaper, light cleaning, plants  Daughter reports the patient is independent with ambulation in her home using a quad cane but she is not without 24-hour supervision.   PATIENT GOALS: I want to be able to walk better and move like I used to be  NEXT MD VISIT: Multiple visits as needed  OBJECTIVE:   DIAGNOSTIC FINDINGS:     MRI brain (without) demonstrating: - Moderate atrophy and moderate chronic small vessel ischemic disease. - Few chronic cerebral microhemorrhages. - No acute findings. No change from 12/29/21.    COMPARISON:  01/29/2023   FINDINGS: Stable hardware is noted in the proximal left femur. No acute fracture or dislocation is noted. No soft tissue changes are seen. Significant degenerative changes about the left knee joint are noted.   IMPRESSION: Postsurgical changes in the left hip. Degenerative changes in the left knee. The overall appearance is stable from the prior exam.        SURVEYS:  NT on eval   SCREENING FOR RED FLAGS: Red flag questioning/screening reassuring NEG   COGNITION: Overall cognitive status: History of cognitive impairments - at baseline     SENSATION: WFL  POSTURE: Posterior leanrounded shoulders, forward head, increased thoracic  kyphosis, flexed trunk , and posterior lean  PALPATION: N/A  LOWER EXTREMITY ROM:     Active  Right eval Left eval Rt./Lt.  06/22/23  Hip flexion     Hip extension     Ankle DF  Lacks 8 deg  Lacks 8 deg  Approx 10 deg bilateral   Hip external rotation     Knee extension   -15  Knee flexion   105  (Blank rows = not tested) (Key: WFL = within functional limits not formally assessed, * = concordant pain, s = stiffness/stretching sensation, NT = not tested)  Comments:    LOWER EXTREMITY MMT:    MMT Right eval Left eval Rt./lt. 06/22/23  Hip flexion 4 4 4   Hip abduction (modified sitting)     Hip internal rotation      Hip external rotation     Knee flexion 4+ 4+ 4+  Knee extension 4+ 4+ 4+  Ankle dorsiflexion   3-/5   (Blank rows = not tested) (Key: WFL = within functional limits not formally assessed, * = concordant pain, s = stiffness/stretching sensation, NT = not tested)  Comments:     FUNCTIONAL TESTS:  5 times sit to stand: 28 sec  with min A and hands on the chair 2 minute walk test: 100 feet    06/22/23 2 min walk test 137 feet with rolling walker, min A     08/09/23 5xSTS with BUE 22.97   08/17/23: 5x sit to stand: 22.30 with bil UE support and legs on back of chair     TUG: 33.47 seconds with walker    2 min walk test: 138 feet with rolling walker  GAIT: Distance walked: 138 Assistive device utilized: Quad cane large base Level of assistance: Min A Comments: Posterior shortened step length/shuffling   TODAY'S TREATMENT:      [facility] Adult PT Treatment:                                                DATE: 08/17/2023 Therapeutic Exercise: NuStep: level 1x 8 minutes -PT present to discuss progress Reassessment complete- see above for objective measures  Ball squeeze with LAQ : 5" hold x15 Seated march 2x10 Ball squeeze 5" x 20   [facility] Adult PT Treatment:                                                DATE: 08/11/2023 Therapeutic Exercise: NuStep: level  1x 8 minutes -PT present to discuss progress Seated: cross punches with 1# weights, overhead press with 1# weights x10 each  Seated:  Ball squeeze with LAQ : 5" hold x15 Seated march 2x10 Hamstring curls with yellow loop 2x10 Hip abduction with yellow loop 2x10 Standing heel raises 2x5 Standing high march  x 10  Hip extension  x 10  Standing shoulder extension with yellow band 2x10- close CGA by PT for safety  [facility] Adult PT Treatment:                                                DATE: 08/09/2023 Therapeutic Exercise: NuStep: level 1x 6 minutes -PT present to discuss  progress 5XSTS  - 22.97 sec - pt struggles with first few reps and then does not fully stand without cueing to let go of chair and lean more fwd. STS with BUE in front of [place] for assist as needed 2x5 cues to shift wt forward Gait with SPC x 71 ft Seated:  Ball squeeze with LAQ : 5" hold x15,  Standing heel raises 2x5 Standing high march  x 10  Hip abduction  x 10   PATIENT EDUCATION:  Education details: rationale for interventions, HEP  Person educated: Patient Education method: Explanation, Demonstration, Tactile cues, Verbal cues Education comprehension: verbalized understanding, returned demonstration, verbal cues required, tactile cues required, and needs further education     HOME EXERCISE PROGRAM: Access Code: [email or web address] URL: [email or web address] Date: 07/13/2023 Prepared by: Fransisco Hertz  Program Notes - with weight shifts, please use counter or rolling walker for upper body support, chair nearby for seated rest. would also recommend caregiver supervision/assist  Exercises - Seated Hip Abduction  - 2-3 x daily - 1 sets - 8 reps - Seated Heel Toe Raises  - 2-3 x daily - 1 sets - 12 reps - Seated March  - 2-3 x daily - 1 sets - 5 reps - Seated Long Arc Quad  - 2-3 x daily - 1 sets - 8 reps - Side to Side Weight Shift with Counter Support  - 2-3 x daily - 1 sets - 5  reps  ASSESSMENT:  CLINICAL IMPRESSION: Pt is making slow gains.  She had a lapse in treatment and has had limited progress due to chronicity of condition, pain, and limited attendance.  Her attendance has been more consistent and she is making some progress toward goals.  Pt with shortened step length, moderate UE support with sit to stand and functional strength deficits.    PT monitored for safety and pain. Patient will benefit from skilled PT to address the below impairments and improve overall function.   OBJECTIVE IMPAIRMENTS: Abnormal gait, decreased activity tolerance, decreased balance, decreased cognition, decreased coordination, decreased endurance, decreased knowledge of condition, decreased knowledge of use of DME, decreased mobility, difficulty walking, decreased ROM, decreased strength, decreased safety awareness, increased edema, increased fascial restrictions, impaired perceived functional ability, impaired flexibility, impaired vision/preception, improper body mechanics, and postural dysfunction.   ACTIVITY LIMITATIONS: carrying, lifting, bending, standing, squatting, stairs, transfers, bed mobility, and locomotion level  PARTICIPATION LIMITATIONS: meal prep, cleaning, shopping, and community activity  PERSONAL FACTORS: Age, Time since onset of injury/illness/exacerbation, and 3+ comorbidities: visual deficits, osteopenia, cognition  are also affecting patient's functional outcome.   REHAB POTENTIAL: Fair given severity of deficits, comorbidities  CLINICAL DECISION MAKING: Evolving/moderate complexity  EVALUATION COMPLEXITY: Moderate   GOALS: Goals reviewed with patient? No - on eval did discuss role of PT, PT POC  SHORT TERM GOALS: Target date: 07/20/2023   Pt will demonstrate appropriate understanding and performance of initially prescribed HEP in order to facilitate improved independence with management of symptoms.  Baseline: HEP with moderate compliance  (08/17/23) Goal status: ongoing   2. Improve 5x sit to stand time to < or = to 17 seconds with bil UE support to reduce falls risk  Baseline:22.30 with bil UE support  (08/17/23)  Goal status:NEW  LONG TERM GOALS: Target date:10/12/23   Improve 5x sit to stand time to < or = to 17 seconds without UE support reduce falls risk  Baseline: 22.30 with bil UE support  (08/17/23) Goal status: INITIAL  2.  Pt will be  able to perform sit to stand w/ safe mechanics and LRAD assistive device in order to facilitate improved safety w/ transfers. Baseline: requires verbal cues for safety (08/17/23) Goal status: In progress   3.  Pt will be able to ambulate 150 feet mod I  LRAD in order to facilitate improved safety w/ household navigation. Baseline: 138 feet (08/17/23)  Goal status:ongoing   4. Pt will demonstrate appropriate performance of final prescribed HEP in order to facilitate improved self-management of symptoms post-discharge.   Baseline: initial HEP prescribed  Goal status: INITIAL    5. Perform TUG in < or = to 25 seconds to reduce falls risk   Baseline: 33.47 sec   Goal status: INITIAL   PLAN:  PT FREQUENCY: 1-2x/week  PT DURATION: 8 weeks  PLANNED INTERVENTIONS: Therapeutic exercises, Therapeutic activity, Neuromuscular re-education, Balance training, Gait training, Patient/Family education, Self Care, Joint mobilization, DME instructions, Aquatic Therapy, and Re-evaluation.  PLAN FOR NEXT SESSION:  work on longer step length, pregait activities, work on sit to stand   [employer], PT 08/17/23 1:28 PM

## 2023-08-18 DIAGNOSIS — H401133 Primary open-angle glaucoma, bilateral, severe stage: Secondary | ICD-10-CM | POA: Diagnosis not present

## 2023-08-18 DIAGNOSIS — H353131 Nonexudative age-related macular degeneration, bilateral, early dry stage: Secondary | ICD-10-CM | POA: Diagnosis not present

## 2023-08-18 NOTE — Therapy (Signed)
OUTPATIENT PHYSICAL THERAPY TREATMENT   Patient Name: Laurie [name] MRN: [id number] DOB:[birth year], 89 y.o., female Today's Date: 08/19/2023    END OF SESSION:  PT End of Session - 08/19/23 1239     Visit Number 11    Number of Visits 17    Date for PT Re-Evaluation 10/12/23    Authorization Type [employer] MCR    Authorization Time Period 12 visits approved 07/06/2023 - 08/17/2023- requested more visitis 08/17/23    Authorization - Visit Number 8    Authorization - Number of Visits 12    Progress Note Due on Visit 20    PT Start Time 1239    PT Stop Time 1318    PT Time Calculation (min) 39 min    Activity Tolerance Patient tolerated treatment well    Behavior During Therapy WFL for tasks assessed/performed              Past Medical History:  Diagnosis Date   CKD (chronic kidney disease), stage III (HCC)    Family history of breast cancer    mother   Family history of colon cancer    father   Glaucoma    Osteopenia    SBO (small bowel obstruction) (HCC)    Past Surgical History:  Procedure Laterality Date   APPENDECTOMY     CESAREAN SECTION     x6   CORONARY STENT INTERVENTION N/A 06/08/2022   Procedure: CORONARY STENT INTERVENTION;  Surgeon: Lennette Bihari, MD;  Location: [facility];  Service: Cardiovascular;  Laterality: N/A;   ESOPHAGOGASTRODUODENOSCOPY  07/07/2012   Procedure: ESOPHAGOGASTRODUODENOSCOPY (EGD);  Surgeon: Willis Modena, MD;  Location: [facility]OS[facility];  Service: Endoscopy;  Laterality: N/A;   GLAUCOMA SURGERY Bilateral 02/17/2022   HIP SURGERY     2 rods were put on left hip   LEFT COLECTOMY  04/30/2011   Sr Streck   LEFT HEART CATH AND CORONARY ANGIOGRAPHY N/A 06/08/2022   Procedure: LEFT HEART CATH AND CORONARY ANGIOGRAPHY;  Surgeon: Lennette Bihari, MD;  Location: [facility];  Service: Cardiovascular;  Laterality: N/A;   tubal cyst  04/30/2011   L fallopian tube cyst incidentally found   Patient Active Problem List    Diagnosis Date Noted   Non-ST elevation (NSTEMI) myocardial infarction (HCC) 06/06/2022   Elevated troponin 06/05/2022   Hiatal hernia 06/05/2022   Elevated d-dimer 06/05/2022   Abnormal urinalysis 06/05/2022   HLD (hyperlipidemia) 06/05/2022   History of stroke 06/05/2022   Chronic kidney disease, stage 3b (HCC) 06/05/2022   Uncontrolled hypertension 12/30/2021   Glaucoma 12/29/2021   Acute ischemic stroke (HCC) 12/28/2021   Chronic pain of right knee 01/15/2020   Unilateral primary osteoarthritis, left knee 01/25/2018   Unilateral primary osteoarthritis, right knee 01/25/2018   Chronic pain of both knees 01/25/2018   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HCC) 12/22/2010    PCP: Daisy Floro, MD  REFERRING PROVIDER: Huel Cote, MD   REFERRING DIAG: W19.XXXD (ICD-10-CM) - Fall, subsequent encounter (Referral comment: "Gait training  Aquatic therapy")  Rationale for Evaluation and Treatment: Rehabilitation  THERAPY DIAG:  Unsteady gait  Difficulty in walking, not elsewhere classified  Abnormal posture  Muscle weakness (generalized)  Cervicalgia  ONSET DATE: 10/2022  SUBJECTIVE:  SUBJECTIVE STATEMENT: Patient reports she does not do her exercises everyday. It depends on who is in the house and how late. I only use the walker if I need it, She has paths in her house that she makes sure are always clear.   PERTINENT HISTORY:  CKD, osteopenia, SBO, hx MI, HTN, hx CVA June 2023  L hip ORIF medullary nailing s/p fall DC home health 10/2021.   PAIN:  Are you having pain: no resting , moderate pain while standing Location/description: NA Best-worst over past week: NA   - aggravating factors: NA  - Easing factors: NA     PRECAUTIONS: Right eye blindness, left eye glaucoma with impairment,  cardiac hx, fall risk, osteopenia  WEIGHT BEARING RESTRICTIONS: No  FALLS:  Has patient fallen in last 6 months? Yes. Number of falls 1  LIVING ENVIRONMENT: Lives with: lives with their son Lives in: House/apartment Stairs: Yes: External: 2 steps; on right going up Has following equipment at home: Quad cane large base, Environmental consultant - 4 wheeled, and Wheelchair (manual)  OCCUPATION: Not  PLOF: Requires assistive device for independence, Needs assistance with ADLs, Needs assistance with homemaking, Needs assistance with gait, and Leisure: reads the newspaper, light cleaning, plants  Daughter reports the patient is independent with ambulation in her home using a quad cane but she is not without 24-hour supervision.   PATIENT GOALS: I want to be able to walk better and move like I used to be  NEXT MD VISIT: Multiple visits as needed  OBJECTIVE:   DIAGNOSTIC FINDINGS:     MRI brain (without) demonstrating: - Moderate atrophy and moderate chronic small vessel ischemic disease. - Few chronic cerebral microhemorrhages. - No acute findings. No change from 12/29/21.    COMPARISON:  01/29/2023   FINDINGS: Stable hardware is noted in the proximal left femur. No acute fracture or dislocation is noted. No soft tissue changes are seen. Significant degenerative changes about the left knee joint are noted.   IMPRESSION: Postsurgical changes in the left hip. Degenerative changes in the left knee. The overall appearance is stable from the prior exam.        SURVEYS:  NT on eval   SCREENING FOR RED FLAGS: Red flag questioning/screening reassuring NEG   COGNITION: Overall cognitive status: History of cognitive impairments - at baseline     SENSATION: WFL  POSTURE: Posterior leanrounded shoulders, forward head, increased thoracic kyphosis, flexed trunk , and posterior lean  PALPATION: N/A   LOWER EXTREMITY ROM:     Active  Right eval Left eval Rt./Lt.  06/22/23  Hip flexion      Hip extension     Ankle DF  Lacks 8 deg  Lacks 8 deg  Approx 10 deg bilateral   Hip external rotation     Knee extension   -15  Knee flexion   105  (Blank rows = not tested) (Key: WFL = within functional limits not formally assessed, * = concordant pain, s = stiffness/stretching sensation, NT = not tested)  Comments:    LOWER EXTREMITY MMT:    MMT Right eval Left eval Rt./lt. 06/22/23  Hip flexion 4 4 4   Hip abduction (modified sitting)     Hip internal rotation      Hip external rotation     Knee flexion 4+ 4+ 4+  Knee extension 4+ 4+ 4+  Ankle dorsiflexion   3-/5   (Blank rows = not tested) (Key: WFL = within functional limits not formally assessed, * =  concordant pain, s = stiffness/stretching sensation, NT = not tested)  Comments:     FUNCTIONAL TESTS:  5 times sit to stand: 28 sec  with min A and hands on the chair 2 minute walk test: 100 feet    06/22/23 2 min walk test 137 feet with rolling walker, min A     08/09/23 5xSTS with BUE 22.97   08/17/23: 5x sit to stand: 22.30 with bil UE support and legs on back of chair     TUG: 33.47 seconds with walker    2 min walk test: 138 feet with rolling walker  GAIT: Distance walked: 138 Assistive device utilized: Quad cane large base Level of assistance: Min A Comments: Posterior shortened step length/shuffling   TODAY'S TREATMENT:       [facility] Adult PT Treatment:                                                DATE: 08/19/2023 Therapeutic Exercise: NuStep: level 5x 8 minutes -PT present to discuss progress Seated: cross punches with 1# weights 2x10 with PT hands as targets, overhead press with 1# weights 2x10 eachSeated:  Ball squeeze with LAQ : x 10 unilaterally, then B x 10 Seated march 2x10 Hamstring curls with yellow loop 2x10 Hip abduction with yellow loop 2x10 B row with yellow loop 2x10 Yellow loop bow and arrow pulls x 10 B Hor ABD yellow band 2x10 Standing heel raises 2x10 Standing HS curl 2x10 B Standing  high march  x 10  Hip extension  x 10  Standing shoulder extension with yellow band 2x10- close CGA by PT for safety  [facility] Adult PT Treatment:                                                DATE: 08/17/2023 Therapeutic Exercise: NuStep: level 1x 8 minutes -PT present to discuss progress Reassessment complete- see above for objective measures  Ball squeeze with LAQ : 5" hold x15 Seated march 2x10 Ball squeeze 5" x 20   [facility] Adult PT Treatment:                                                DATE: 08/11/2023 Therapeutic Exercise: NuStep: level 1x 8 minutes -PT present to discuss progress Seated:  cross punches with 1# weights, overhead press with 1# weights x10 each  Seated:  Ball squeeze with LAQ : 5" hold x15 Seated march 2x10 Hamstring curls with yellow loop 2x10 Hip abduction with yellow loop 2x10 Standing heel raises 2x5 Standing high march  x 10  Hip extension  x 10  Standing shoulder extension with yellow band 2x10- close CGA by PT for safety  [facility] Adult PT Treatment:                                                DATE: 08/09/2023 Therapeutic Exercise: NuStep: level 1x 6 minutes -PT present to discuss progress 5XSTS  -  22.97 sec - pt struggles with first few reps and then does not fully stand without cueing to let go of chair and lean more fwd. STS with BUE in front of Barre for assist as needed 2x5 cues to shift wt forward Gait with SPC x 71 ft Seated:  Ball squeeze with LAQ : 5" hold x15,  Standing heel raises 2x5 Standing high march  x 10  Hip abduction  x 10   PATIENT EDUCATION:  Education details: rationale for interventions, HEP  Person educated: Patient Education method: Explanation, Demonstration, Tactile cues, Verbal cues Education comprehension: verbalized understanding, returned demonstration, verbal cues required, tactile cues required, and needs further education     HOME EXERCISE PROGRAM: Access Code: [email or web address] URL:  [email or web address] Date: 07/13/2023 Prepared by: Fransisco Hertz  Program Notes - with weight shifts, please use counter or rolling walker for upper body support, chair nearby for seated rest. would also recommend caregiver supervision/assist  Exercises - Seated Hip Abduction  - 2-3 x daily - 1 sets - 8 reps - Seated Heel Toe Raises  - 2-3 x daily - 1 sets - 12 reps - Seated March  - 2-3 x daily - 1 sets - 5 reps - Seated Long Arc Quad  - 2-3 x daily - 1 sets - 8 reps - Side to Side Weight Shift with Counter Support  - 2-3 x daily - 1 sets - 5 reps  ASSESSMENT:  CLINICAL IMPRESSION: Patient 9 min late. She likes the Nustep and states it feels good. She was able to do L5 today with no complaints. She also did well with her exercises and tolerated increase reps. She still requires close supervision with transfers from walker to Nustep and with gait secondary to visual deficits.   PT monitored patient for safety and pain throughout session. Patient will benefit from skilled PT to address the below impairments and improve overall function.   OBJECTIVE IMPAIRMENTS: Abnormal gait, decreased activity tolerance, decreased balance, decreased cognition, decreased coordination, decreased endurance, decreased knowledge of condition, decreased knowledge of use of DME, decreased mobility, difficulty walking, decreased ROM, decreased strength, decreased safety awareness, increased edema, increased fascial restrictions, impaired perceived functional ability, impaired flexibility, impaired vision/preception, improper body mechanics, and postural dysfunction.   ACTIVITY LIMITATIONS: carrying, lifting, bending, standing, squatting, stairs, transfers, bed mobility, and locomotion level  PARTICIPATION LIMITATIONS: meal prep, cleaning, shopping, and community activity  PERSONAL FACTORS: Age, Time since onset of injury/illness/exacerbation, and 3+ comorbidities: visual deficits, osteopenia,  cognition  are also affecting patient's functional outcome.   REHAB POTENTIAL: Fair given severity of deficits, comorbidities  CLINICAL DECISION MAKING: Evolving/moderate complexity  EVALUATION COMPLEXITY: Moderate   GOALS: Goals reviewed with patient? No - on eval did discuss role of PT, PT POC  SHORT TERM GOALS: Target date: 07/20/2023   Pt will demonstrate appropriate understanding and performance of initially prescribed HEP in order to facilitate improved independence with management of symptoms.  Baseline: HEP with moderate compliance (08/17/23) Goal status: ongoing   2. Improve 5x sit to stand time to < or = to 17 seconds with bil UE support to reduce falls risk  Baseline:22.30 with bil UE support  (08/17/23)  Goal status:NEW  LONG TERM GOALS: Target date:10/12/23   Improve 5x sit to stand time to < or = to 17 seconds without UE support reduce falls risk  Baseline: 22.30 with bil UE support  (08/17/23) Goal status: INITIAL  2.  Pt will be able to perform sit to stand w/ safe  mechanics and LRAD assistive device in order to facilitate improved safety w/ transfers. Baseline: requires verbal cues for safety (08/17/23) Goal status: In progress   3.  Pt will be able to ambulate 150 feet mod I  LRAD in order to facilitate improved safety w/ household navigation. Baseline: 138 feet (08/17/23)  Goal status:ongoing   4. Pt will demonstrate appropriate performance of final prescribed HEP in order to facilitate improved self-management of symptoms post-discharge.   Baseline: initial HEP prescribed  Goal status: INITIAL    5. Perform TUG in < or = to 25 seconds to reduce falls risk   Baseline: 33.47 sec   Goal status: INITIAL   PLAN:  PT FREQUENCY: 1-2x/week  PT DURATION: 8 weeks  PLANNED INTERVENTIONS: Therapeutic exercises, Therapeutic activity, Neuromuscular re-education, Balance training, Gait training, Patient/Family education, Self Care, Joint mobilization, DME  instructions, Aquatic Therapy, and Re-evaluation.  PLAN FOR NEXT SESSION:  work on longer step length, pregait activities, work on sit to [employer], PT 08/19/23 1:21 PM

## 2023-08-19 ENCOUNTER — Encounter: Payer: Self-pay | Admitting: Physical Therapy

## 2023-08-19 ENCOUNTER — Ambulatory Visit: Payer: Medicare Other | Admitting: Physical Therapy

## 2023-08-19 DIAGNOSIS — R293 Abnormal posture: Secondary | ICD-10-CM

## 2023-08-19 DIAGNOSIS — M542 Cervicalgia: Secondary | ICD-10-CM

## 2023-08-19 DIAGNOSIS — M6281 Muscle weakness (generalized): Secondary | ICD-10-CM | POA: Diagnosis not present

## 2023-08-19 DIAGNOSIS — R2681 Unsteadiness on feet: Secondary | ICD-10-CM | POA: Diagnosis not present

## 2023-08-19 DIAGNOSIS — R262 Difficulty in walking, not elsewhere classified: Secondary | ICD-10-CM

## 2023-08-23 ENCOUNTER — Ambulatory Visit: Payer: Medicare Other

## 2023-08-23 DIAGNOSIS — M542 Cervicalgia: Secondary | ICD-10-CM | POA: Diagnosis not present

## 2023-08-23 DIAGNOSIS — R293 Abnormal posture: Secondary | ICD-10-CM | POA: Diagnosis not present

## 2023-08-23 DIAGNOSIS — M6281 Muscle weakness (generalized): Secondary | ICD-10-CM

## 2023-08-23 DIAGNOSIS — R2681 Unsteadiness on feet: Secondary | ICD-10-CM | POA: Diagnosis not present

## 2023-08-23 DIAGNOSIS — R262 Difficulty in walking, not elsewhere classified: Secondary | ICD-10-CM

## 2023-08-23 NOTE — Therapy (Signed)
OUTPATIENT PHYSICAL THERAPY TREATMENT   Patient Name: Laurie [name] MRN: [id number] DOB:1931-12-07, 89 y.o., female Today's Date: 08/23/2023    END OF SESSION:  PT End of Session - 08/23/23 1311     Visit Number 12    Date for PT Re-Evaluation 10/12/23    Authorization Type [employer] MCR    Authorization Time Period 8 visits-08/17/2023-09/14/2023-auth#[id number]    Authorization - Visit Number 2    Authorization - Number of Visits 8    Progress Note Due on Visit 20    PT Start Time 1231    PT Stop Time 1313    PT Time Calculation (min) 42 min    Activity Tolerance Patient tolerated treatment well    Behavior During Therapy WFL for tasks assessed/performed               Past Medical History:  Diagnosis Date   CKD (chronic kidney disease), stage III (HCC)    Family history of breast cancer    mother   Family history of colon cancer    father   Glaucoma    Osteopenia    SBO (small bowel obstruction) (HCC)    Past Surgical History:  Procedure Laterality Date   APPENDECTOMY     CESAREAN SECTION     x6   CORONARY STENT INTERVENTION N/A 06/08/2022   Procedure: CORONARY STENT INTERVENTION;  Surgeon: Lennette Bihari, MD;  Location: [facility];  Service: Cardiovascular;  Laterality: N/A;   ESOPHAGOGASTRODUODENOSCOPY  07/07/2012   Procedure: ESOPHAGOGASTRODUODENOSCOPY (EGD);  Surgeon: Willis Modena, MD;  Location: [facility];  Service: Endoscopy;  Laterality: N/A;   GLAUCOMA SURGERY Bilateral 02/17/2022   HIP SURGERY     2 rods were put on left hip   LEFT COLECTOMY  04/30/2011   Sr Streck   LEFT HEART CATH AND CORONARY ANGIOGRAPHY N/A 06/08/2022   Procedure: LEFT HEART CATH AND CORONARY ANGIOGRAPHY;  Surgeon: Lennette Bihari, MD;  Location: [facility];  Service: Cardiovascular;  Laterality: N/A;   tubal cyst  04/30/2011   L fallopian tube cyst incidentally found   Patient Active Problem List   Diagnosis Date Noted   Non-ST elevation (NSTEMI)  myocardial infarction (HCC) 06/06/2022   Elevated troponin 06/05/2022   Hiatal hernia 06/05/2022   Elevated d-dimer 06/05/2022   Abnormal urinalysis 06/05/2022   HLD (hyperlipidemia) 06/05/2022   History of stroke 06/05/2022   Chronic kidney disease, stage 3b (HCC) 06/05/2022   Uncontrolled hypertension 12/30/2021   Glaucoma 12/29/2021   Acute ischemic stroke (HCC) 12/28/2021   Chronic pain of right knee 01/15/2020   Unilateral primary osteoarthritis, left knee 01/25/2018   Unilateral primary osteoarthritis, right knee 01/25/2018   Chronic pain of both knees 01/25/2018   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HCC) 12/22/2010    PCP: Daisy Floro, MD  REFERRING PROVIDER: Huel Cote, MD   REFERRING DIAG: W19.XXXD (ICD-10-CM) - Fall, subsequent encounter (Referral comment: "Gait training  Aquatic therapy")  Rationale for Evaluation and Treatment: Rehabilitation  THERAPY DIAG:  Unsteady gait  Abnormal posture  Difficulty in walking, not elsewhere classified  Muscle weakness (generalized)  ONSET DATE: 10/2022  SUBJECTIVE:  SUBJECTIVE STATEMENT: Pt arrived without her walker.  Her son reports that she wanted to walk in without it.    PERTINENT HISTORY:  CKD, osteopenia, SBO, hx MI, HTN, hx CVA June 2023  L hip ORIF medullary nailing s/p fall DC home health 10/2021.   PAIN:  Are you having pain: no resting , moderate pain while standing Location/description: NA Best-worst over past week: NA   - aggravating factors: NA  - Easing factors: NA     PRECAUTIONS: Right eye blindness, left eye glaucoma with impairment, cardiac hx, fall risk, osteopenia  WEIGHT BEARING RESTRICTIONS: No  FALLS:  Has patient fallen in last 6 months? Yes. Number of falls 1  LIVING ENVIRONMENT: Lives with:  lives with their son Lives in: House/apartment Stairs: Yes: External: 2 steps; on right going up Has following equipment at home: Quad cane large base, Environmental consultant - 4 wheeled, and Wheelchair (manual)  OCCUPATION: Not  PLOF: Requires assistive device for independence, Needs assistance with ADLs, Needs assistance with homemaking, Needs assistance with gait, and Leisure: reads the newspaper, light cleaning, plants  Daughter reports the patient is independent with ambulation in her home using a quad cane but she is not without 24-hour supervision.   PATIENT GOALS: I want to be able to walk better and move like I used to be  NEXT MD VISIT: Multiple visits as needed  OBJECTIVE:   DIAGNOSTIC FINDINGS:     MRI brain (without) demonstrating: - Moderate atrophy and moderate chronic small vessel ischemic disease. - Few chronic cerebral microhemorrhages. - No acute findings. No change from 12/29/21.    COMPARISON:  01/29/2023   FINDINGS: Stable hardware is noted in the proximal left femur. No acute fracture or dislocation is noted. No soft tissue changes are seen. Significant degenerative changes about the left knee joint are noted.   IMPRESSION: Postsurgical changes in the left hip. Degenerative changes in the left knee. The overall appearance is stable from the prior exam.        SURVEYS:  NT on eval   SCREENING FOR RED FLAGS: Red flag questioning/screening reassuring NEG   COGNITION: Overall cognitive status: History of cognitive impairments - at baseline     SENSATION: WFL  POSTURE: Posterior leanrounded shoulders, forward head, increased thoracic kyphosis, flexed trunk , and posterior lean  PALPATION: N/A   LOWER EXTREMITY ROM:     Active  Right eval Left eval Rt./Lt.  06/22/23  Hip flexion     Hip extension     Ankle DF  Lacks 8 deg  Lacks 8 deg  Approx 10 deg bilateral   Hip external rotation     Knee extension   -15  Knee flexion   105  (Blank rows = not  tested) (Key: WFL = within functional limits not formally assessed, * = concordant pain, s = stiffness/stretching sensation, NT = not tested)  Comments:    LOWER EXTREMITY MMT:    MMT Right eval Left eval Rt./lt. 06/22/23  Hip flexion 4 4 4   Hip abduction (modified sitting)     Hip internal rotation      Hip external rotation     Knee flexion 4+ 4+ 4+  Knee extension 4+ 4+ 4+  Ankle dorsiflexion   3-/5   (Blank rows = not tested) (Key: WFL = within functional limits not formally assessed, * = concordant pain, s = stiffness/stretching sensation, NT = not tested)  Comments:     FUNCTIONAL TESTS:  5 times sit to stand:  28 sec  with min A and hands on the chair 2 minute walk test: 100 feet    06/22/23 2 min walk test 137 feet with rolling walker, min A     08/09/23 5xSTS with BUE 22.97   08/17/23: 5x sit to stand: 22.30 with bil UE support and legs on back of chair     TUG: 33.47 seconds with walker    2 min walk test: 138 feet with rolling walker  GAIT: Distance walked: 138 Assistive device utilized: Quad cane large base Level of assistance: Min A Comments: Posterior shortened step length/shuffling   TODAY'S TREATMENT:      [facility] Adult PT Treatment:                                                DATE: 08/23/2023 Therapeutic Exercise: NuStep: level 5x 8 minutes -PT present to discuss progress Seated: cross punches with 1# weights 2x10 with PT hands as targets, overhead press with 1# weights 2x10 each Seated:  Ball squeeze 5" x20 Seated long arc quad 2# added 2x10 Seated march 2x10 Standing heel raises 2x10  Pre gait: weight shifting anterior/posterior, step forward to pod on floor: 1 UE on barre, hand held assistance with other UE Gait along barre with focus on step length  [facility] Adult PT Treatment:                                                DATE: 08/19/2023 Therapeutic Exercise: NuStep: level 5x 8 minutes -PT present to discuss progress Seated: cross punches with 1#  weights 2x10 with PT hands as targets, overhead press with 1# weights 2x10 eachSeated:  Seated march 2x10 Hamstring curls with yellow loop 2x10 Hip abduction with yellow loop 2x10 B row with yellow loop 2x10 Yellow loop bow and arrow pulls x 10 B Hor ABD yellow band 2x10 Standing heel raises 2x10 Standing HS curl 2x10 B Standing high march  x 10  Hip extension  x 10  Standing shoulder extension with yellow band 2x10- close CGA by PT for safety  [facility] Adult PT Treatment:                                                DATE: 08/17/2023 Therapeutic Exercise: NuStep: level 1x 8 minutes -PT present to discuss progress Reassessment complete- see above for objective measures  Ball squeeze with LAQ : 5" hold x15 Seated march 2x10 Ball squeeze 5" x 20    PATIENT EDUCATION:  Education details: rationale for interventions, HEP  Person educated: Patient Education method: Explanation, Demonstration, Tactile cues, Verbal cues Education comprehension: verbalized understanding, returned demonstration, verbal cues required, tactile cues required, and needs further education     HOME EXERCISE PROGRAM: Access Code: [email or web address] URL: [email or web address] Date: 07/13/2023 Prepared by: Fransisco Hertz  Program Notes - with weight shifts, please use counter or rolling walker for upper body support, chair nearby for seated rest. would also recommend caregiver supervision/assist  Exercises - Seated Hip Abduction  - 2-3 x daily - 1 sets - 8 reps - Seated Heel Toe Raises  -  2-3 x daily - 1 sets - 12 reps - Seated March  - 2-3 x daily - 1 sets - 5 reps - Seated Long Arc Quad  - 2-3 x daily - 1 sets - 8 reps - Side to Side Weight Shift with Counter Support  - 2-3 x daily - 1 sets - 5 reps  ASSESSMENT:  CLINICAL IMPRESSION: Pt arrived without her walker and required hand held assistance to enter the clinic.  She also did well with her exercises and tolerated increase reps. She still requires  close supervision with transfers, exercise with gait secondary to visual deficits.  She requires max cueing and tactile cues for increased step length and pre-gait activities. She was able to demonstrate carryover with this along the barre after weight shifting.  PT monitored patient for safety and pain throughout session. Patient will benefit from skilled PT to address the below impairments and improve overall function.   OBJECTIVE IMPAIRMENTS: Abnormal gait, decreased activity tolerance, decreased balance, decreased cognition, decreased coordination, decreased endurance, decreased knowledge of condition, decreased knowledge of use of DME, decreased mobility, difficulty walking, decreased ROM, decreased strength, decreased safety awareness, increased edema, increased fascial restrictions, impaired perceived functional ability, impaired flexibility, impaired vision/preception, improper body mechanics, and postural dysfunction.   ACTIVITY LIMITATIONS: carrying, lifting, bending, standing, squatting, stairs, transfers, bed mobility, and locomotion level  PARTICIPATION LIMITATIONS: meal prep, cleaning, shopping, and community activity  PERSONAL FACTORS: Age, Time since onset of injury/illness/exacerbation, and 3+ comorbidities: visual deficits, osteopenia, cognition  are also affecting patient's functional outcome.   REHAB POTENTIAL: Fair given severity of deficits, comorbidities  CLINICAL DECISION MAKING: Evolving/moderate complexity  EVALUATION COMPLEXITY: Moderate   GOALS: Goals reviewed with patient? No - on eval did discuss role of PT, PT POC  SHORT TERM GOALS: Target date: 07/20/2023   Pt will demonstrate appropriate understanding and performance of initially prescribed HEP in order to facilitate improved independence with management of symptoms.  Baseline: HEP with moderate compliance (08/17/23) Goal status: ongoing   2. Improve 5x sit to stand time to < or = to 17 seconds with bil UE  support to reduce falls risk  Baseline:22.30 with bil UE support  (08/17/23)  Goal status:NEW  LONG TERM GOALS: Target date:10/12/23   Improve 5x sit to stand time to < or = to 17 seconds without UE support reduce falls risk  Baseline: 22.30 with bil UE support  (08/17/23) Goal status: INITIAL  2.  Pt will be able to perform sit to stand w/ safe mechanics and LRAD assistive device in order to facilitate improved safety w/ transfers. Baseline: requires verbal cues for safety (08/17/23) Goal status: In progress   3.  Pt will be able to ambulate 150 feet mod I  LRAD in order to facilitate improved safety w/ household navigation. Baseline: 138 feet (08/17/23)  Goal status:ongoing   4. Pt will demonstrate appropriate performance of final prescribed HEP in order to facilitate improved self-management of symptoms post-discharge.   Baseline: initial HEP prescribed  Goal status: INITIAL    5. Perform TUG in < or = to 25 seconds to reduce falls risk   Baseline: 33.47 sec   Goal status: INITIAL   PLAN:  PT FREQUENCY: 1-2x/week  PT DURATION: 8 weeks  PLANNED INTERVENTIONS: Therapeutic exercises, Therapeutic activity, Neuromuscular re-education, Balance training, Gait training, Patient/Family education, Self Care, Joint mobilization, DME instructions, Aquatic Therapy, and Re-evaluation.  PLAN FOR NEXT SESSION:  work on longer step length, pregait activities, work on sit to  stand   [employer], PT 08/23/23 1:16 PM

## 2023-08-25 ENCOUNTER — Ambulatory Visit: Payer: Medicare Other

## 2023-08-25 DIAGNOSIS — R293 Abnormal posture: Secondary | ICD-10-CM | POA: Diagnosis not present

## 2023-08-25 DIAGNOSIS — R262 Difficulty in walking, not elsewhere classified: Secondary | ICD-10-CM | POA: Diagnosis not present

## 2023-08-25 DIAGNOSIS — M542 Cervicalgia: Secondary | ICD-10-CM | POA: Diagnosis not present

## 2023-08-25 DIAGNOSIS — M6281 Muscle weakness (generalized): Secondary | ICD-10-CM | POA: Diagnosis not present

## 2023-08-25 DIAGNOSIS — R2681 Unsteadiness on feet: Secondary | ICD-10-CM | POA: Diagnosis not present

## 2023-08-25 NOTE — Therapy (Signed)
OUTPATIENT PHYSICAL THERAPY TREATMENT   Patient Name: Laurie [name] MRN: [id number] DOB:June 11, 1932, 89 y.o., female Today's Date: 08/25/2023    END OF SESSION:  PT End of Session - 08/25/23 1321     Visit Number 13    Date for PT Re-Evaluation 10/12/23    Authorization Type [employer] MCR    Authorization Time Period 8 visits-08/17/2023-09/14/2023-auth#[id number]    Authorization - Visit Number 3    Authorization - Number of Visits 8    Progress Note Due on Visit 20    PT Start Time 1235    PT Stop Time 1316    PT Time Calculation (min) 41 min    Behavior During Therapy WFL for tasks assessed/performed                Past Medical History:  Diagnosis Date   CKD (chronic kidney disease), stage III (HCC)    Family history of breast cancer    mother   Family history of colon cancer    father   Glaucoma    Osteopenia    SBO (small bowel obstruction) (HCC)    Past Surgical History:  Procedure Laterality Date   APPENDECTOMY     CESAREAN SECTION     x6   CORONARY STENT INTERVENTION N/A 06/08/2022   Procedure: CORONARY STENT INTERVENTION;  Surgeon: Lennette Bihari, MD;  Location: [facility];  Service: Cardiovascular;  Laterality: N/A;   ESOPHAGOGASTRODUODENOSCOPY  07/07/2012   Procedure: ESOPHAGOGASTRODUODENOSCOPY (EGD);  Surgeon: Willis Modena, MD;  Location: [facility] ENDOSCOPY;  Service: Endoscopy;  Laterality: N/A;   GLAUCOMA SURGERY Bilateral 02/17/2022   HIP SURGERY     2 rods were put on left hip   LEFT COLECTOMY  04/30/2011   Sr Streck   LEFT HEART CATH AND CORONARY ANGIOGRAPHY N/A 06/08/2022   Procedure: LEFT HEART CATH AND CORONARY ANGIOGRAPHY;  Surgeon: Lennette Bihari, MD;  Location: [facility];  Service: Cardiovascular;  Laterality: N/A;   tubal cyst  04/30/2011   L fallopian tube cyst incidentally found   Patient Active Problem List   Diagnosis Date Noted   Non-ST elevation (NSTEMI) myocardial infarction (HCC) 06/06/2022   Elevated troponin  06/05/2022   Hiatal hernia 06/05/2022   Elevated d-dimer 06/05/2022   Abnormal urinalysis 06/05/2022   HLD (hyperlipidemia) 06/05/2022   History of stroke 06/05/2022   Chronic kidney disease, stage 3b (HCC) 06/05/2022   Uncontrolled hypertension 12/30/2021   Glaucoma 12/29/2021   Acute ischemic stroke (HCC) 12/28/2021   Chronic pain of right knee 01/15/2020   Unilateral primary osteoarthritis, left knee 01/25/2018   Unilateral primary osteoarthritis, right knee 01/25/2018   Chronic pain of both knees 01/25/2018   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HCC) 12/22/2010    PCP: Daisy Floro, MD  REFERRING PROVIDER: Huel Cote, MD   REFERRING DIAG: W19.XXXD (ICD-10-CM) - Fall, subsequent encounter (Referral comment: "Gait training  Aquatic therapy")  Rationale for Evaluation and Treatment: Rehabilitation  THERAPY DIAG:  Unsteady gait  Abnormal posture  Difficulty in walking, not elsewhere classified  Muscle weakness (generalized)  ONSET DATE: 10/2022  SUBJECTIVE:  SUBJECTIVE STATEMENT: I'm in a bad mood today.  I didn't want to come.     PERTINENT HISTORY:  CKD, osteopenia, SBO, hx MI, HTN, hx CVA June 2023  L hip ORIF medullary nailing s/p fall DC home health 10/2021.   PAIN:  Are you having pain: no resting , moderate pain while standing Location/description: NA Best-worst over past week: NA   - aggravating factors: NA  - Easing factors: NA     PRECAUTIONS: Right eye blindness, left eye glaucoma with impairment, cardiac hx, fall risk, osteopenia  WEIGHT BEARING RESTRICTIONS: No  FALLS:  Has patient fallen in last 6 months? Yes. Number of falls 1  LIVING ENVIRONMENT: Lives with: lives with their son Lives in: House/apartment Stairs: Yes: External: 2 steps; on right going  up Has following equipment at home: Quad cane large base, Environmental consultant - 4 wheeled, and Wheelchair (manual)  OCCUPATION: Not  PLOF: Requires assistive device for independence, Needs assistance with ADLs, Needs assistance with homemaking, Needs assistance with gait, and Leisure: reads the newspaper, light cleaning, plants  Daughter reports the patient is independent with ambulation in her home using a quad cane but she is not without 24-hour supervision.   PATIENT GOALS: I want to be able to walk better and move like I used to be  NEXT MD VISIT: Multiple visits as needed  OBJECTIVE:   DIAGNOSTIC FINDINGS:     MRI brain (without) demonstrating: - Moderate atrophy and moderate chronic small vessel ischemic disease. - Few chronic cerebral microhemorrhages. - No acute findings. No change from 12/29/21.    COMPARISON:  01/29/2023   FINDINGS: Stable hardware is noted in the proximal left femur. No acute fracture or dislocation is noted. No soft tissue changes are seen. Significant degenerative changes about the left knee joint are noted.   IMPRESSION: Postsurgical changes in the left hip. Degenerative changes in the left knee. The overall appearance is stable from the prior exam.        SURVEYS:  NT on eval   SCREENING FOR RED FLAGS: Red flag questioning/screening reassuring NEG   COGNITION: Overall cognitive status: History of cognitive impairments - at baseline     SENSATION: WFL  POSTURE: Posterior leanrounded shoulders, forward head, increased thoracic kyphosis, flexed trunk , and posterior lean  PALPATION: N/A   LOWER EXTREMITY ROM:     Active  Right eval Left eval Rt./Lt.  06/22/23  Hip flexion     Hip extension     Ankle DF  Lacks 8 deg  Lacks 8 deg  Approx 10 deg bilateral   Hip external rotation     Knee extension   -15  Knee flexion   105  (Blank rows = not tested) (Key: WFL = within functional limits not formally assessed, * = concordant pain, s =  stiffness/stretching sensation, NT = not tested)  Comments:    LOWER EXTREMITY MMT:    MMT Right eval Left eval Rt./lt. 06/22/23  Hip flexion 4 4 4   Hip abduction (modified sitting)     Hip internal rotation      Hip external rotation     Knee flexion 4+ 4+ 4+  Knee extension 4+ 4+ 4+  Ankle dorsiflexion   3-/5   (Blank rows = not tested) (Key: WFL = within functional limits not formally assessed, * = concordant pain, s = stiffness/stretching sensation, NT = not tested)  Comments:     FUNCTIONAL TESTS:  5 times sit to stand: 28 sec  with  min A and hands on the chair 2 minute walk test: 100 feet    06/22/23 2 min walk test 137 feet with rolling walker, min A     08/09/23 5xSTS with BUE 22.97   08/17/23: 5x sit to stand: 22.30 with bil UE support and legs on back of chair     TUG: 33.47 seconds with walker    2 min walk test: 138 feet with rolling walker  GAIT: Distance walked: 138 Assistive device utilized: Quad cane large base Level of assistance: Min A Comments: Posterior shortened step length/shuffling   TODAY'S TREATMENT:      [facility] Adult PT Treatment:                                                DATE: 08/25/2023 Therapeutic Exercise: NuStep: level 5x 8 minutes -PT present to discuss progress and provide cues for speed Seated: cross punches with 1# weights 2x10 with PT hands as targets, overhead press with 1# weights 2x10 each Seated:  Ball squeeze 5" x20 Seated long arc quad 3# added 2x10 Seated march 2x10 3# added  Hip abduction with blue band x20  Pre gait: weight shifting anterior/posterior, step forward to pod on floor: 1 UE on barre Gait with walker 2x25 feet   [facility] Adult PT Treatment:                                                DATE: 08/23/2023 Therapeutic Exercise: NuStep: level 5x 8 minutes -PT present to discuss progress Seated: cross punches with 1# weights 2x10 with PT hands as targets, overhead press with 1# weights 2x10 each Seated:  Ball  squeeze 5" x20 Seated long arc quad 2# added 2x10 Seated march 2x10 Standing heel raises 2x10  Pre gait: weight shifting anterior/posterior, step forward to pod on floor: 1 UE on barre, hand held assistance with other UE Gait along barre with focus on step length  [facility] Adult PT Treatment:                                                DATE: 08/19/2023 Therapeutic Exercise: NuStep: level 5x 8 minutes -PT present to discuss progress Seated: cross punches with 1# weights 2x10 with PT hands as targets, overhead press with 1# weights 2x10 eachSeated:  Seated march 2x10 Hamstring curls with yellow loop 2x10 Hip abduction with yellow loop 2x10 B row with yellow loop 2x10 Yellow loop bow and arrow pulls x 10 B Hor ABD yellow band 2x10 Standing heel raises 2x10 Standing HS curl 2x10 B Standing high march  x 10  Hip extension  x 10  Standing shoulder extension with yellow band 2x10- close CGA by PT for safety    PATIENT EDUCATION:  Education details: rationale for interventions, HEP  Person educated: Patient Education method: Explanation, Demonstration, Tactile cues, Verbal cues Education comprehension: verbalized understanding, returned demonstration, verbal cues required, tactile cues required, and needs further education     HOME EXERCISE PROGRAM: Access Code: [email or web address] URL: [email or web address] Date: 07/13/2023 Prepared by: Fransisco Hertz  Program Notes - with weight shifts,  please use counter or rolling walker for upper body support, chair nearby for seated rest. would also recommend caregiver supervision/assist  Exercises - Seated Hip Abduction  - 2-3 x daily - 1 sets - 8 reps - Seated Heel Toe Raises  - 2-3 x daily - 1 sets - 12 reps - Seated March  - 2-3 x daily - 1 sets - 5 reps - Seated Long Arc Quad  - 2-3 x daily - 1 sets - 8 reps - Side to Side Weight Shift with Counter Support  - 2-3 x daily - 1 sets - 5 reps  ASSESSMENT:  CLINICAL IMPRESSION: Pt  arrived and reports that she didn't want to come today and was agreeable to exercise.  She requires moderate cueing and tactile cues for increased step length and pre-gait activities today. She used less UE support and had good follow through today with improved step length.  She tolerated increased weight with long arc quads today.  PT monitored patient for safety and pain throughout session. Patient will benefit from skilled PT to address the below impairments and improve overall function.   OBJECTIVE IMPAIRMENTS: Abnormal gait, decreased activity tolerance, decreased balance, decreased cognition, decreased coordination, decreased endurance, decreased knowledge of condition, decreased knowledge of use of DME, decreased mobility, difficulty walking, decreased ROM, decreased strength, decreased safety awareness, increased edema, increased fascial restrictions, impaired perceived functional ability, impaired flexibility, impaired vision/preception, improper body mechanics, and postural dysfunction.   ACTIVITY LIMITATIONS: carrying, lifting, bending, standing, squatting, stairs, transfers, bed mobility, and locomotion level  PARTICIPATION LIMITATIONS: meal prep, cleaning, shopping, and community activity  PERSONAL FACTORS: Age, Time since onset of injury/illness/exacerbation, and 3+ comorbidities: visual deficits, osteopenia, cognition  are also affecting patient's functional outcome.   REHAB POTENTIAL: Fair given severity of deficits, comorbidities  CLINICAL DECISION MAKING: Evolving/moderate complexity  EVALUATION COMPLEXITY: Moderate   GOALS: Goals reviewed with patient? No - on eval did discuss role of PT, PT POC  SHORT TERM GOALS: Target date: 07/20/2023   Pt will demonstrate appropriate understanding and performance of initially prescribed HEP in order to facilitate improved independence with management of symptoms.  Baseline: HEP with moderate compliance (08/17/23) Goal status: ongoing    2. Improve 5x sit to stand time to < or = to 17 seconds with bil UE support to reduce falls risk  Baseline:22.30 with bil UE support  (08/17/23)  Goal status:NEW  LONG TERM GOALS: Target date:10/12/23   Improve 5x sit to stand time to < or = to 17 seconds without UE support reduce falls risk  Baseline: 22.30 with bil UE support  (08/17/23) Goal status: INITIAL  2.  Pt will be able to perform sit to stand w/ safe mechanics and LRAD assistive device in order to facilitate improved safety w/ transfers. Baseline: requires verbal cues for safety (08/17/23) Goal status: In progress   3.  Pt will be able to ambulate 150 feet mod I  LRAD in order to facilitate improved safety w/ household navigation. Baseline: 138 feet (08/17/23)  Goal status:ongoing   4. Pt will demonstrate appropriate performance of final prescribed HEP in order to facilitate improved self-management of symptoms post-discharge.   Baseline: initial HEP prescribed  Goal status: INITIAL    5. Perform TUG in < or = to 25 seconds to reduce falls risk   Baseline: 33.47 sec   Goal status: INITIAL   PLAN:  PT FREQUENCY: 1-2x/week  PT DURATION: 8 weeks  PLANNED INTERVENTIONS: Therapeutic exercises, Therapeutic activity, Neuromuscular re-education, Balance  training, Gait training, Patient/Family education, Self Care, Joint mobilization, DME instructions, Aquatic Therapy, and Re-evaluation.  PLAN FOR NEXT SESSION:  work on longer step length, pregait activities, work on sit to stand   [employer], PT 08/25/23 1:24 PM

## 2023-08-30 ENCOUNTER — Ambulatory Visit: Payer: Medicare Other | Attending: Orthopaedic Surgery

## 2023-08-30 DIAGNOSIS — M6281 Muscle weakness (generalized): Secondary | ICD-10-CM | POA: Diagnosis not present

## 2023-08-30 DIAGNOSIS — R2681 Unsteadiness on feet: Secondary | ICD-10-CM

## 2023-08-30 DIAGNOSIS — M542 Cervicalgia: Secondary | ICD-10-CM | POA: Diagnosis not present

## 2023-08-30 DIAGNOSIS — R262 Difficulty in walking, not elsewhere classified: Secondary | ICD-10-CM

## 2023-08-30 DIAGNOSIS — R293 Abnormal posture: Secondary | ICD-10-CM | POA: Diagnosis not present

## 2023-08-30 NOTE — Therapy (Signed)
OUTPATIENT PHYSICAL THERAPY TREATMENT   Patient Name: Laurie [name] MRN: [id number] DOB:05-17-1932, 89 y.o., female Today's Date: 08/30/2023    END OF SESSION:  PT End of Session - 08/30/23 1443     Visit Number 14    Date for PT Re-Evaluation 10/12/23    Authorization Type [employer] MCR    Authorization Time Period 8 visits-08/17/2023-09/14/2023-auth#[id number]    Authorization - Visit Number 4    Authorization - Number of Visits 8    Progress Note Due on Visit 20    PT Start Time 1400    PT Stop Time 1443    PT Time Calculation (min) 43 min    Activity Tolerance Patient tolerated treatment well    Behavior During Therapy WFL for tasks assessed/performed                 Past Medical History:  Diagnosis Date   CKD (chronic kidney disease), stage III (HCC)    Family history of breast cancer    mother   Family history of colon cancer    father   Glaucoma    Osteopenia    SBO (small bowel obstruction) (HCC)    Past Surgical History:  Procedure Laterality Date   APPENDECTOMY     CESAREAN SECTION     x6   CORONARY STENT INTERVENTION N/A 06/08/2022   Procedure: CORONARY STENT INTERVENTION;  Surgeon: Lennette Bihari, MD;  Location: [facility];  Service: Cardiovascular;  Laterality: N/A;   ESOPHAGOGASTRODUODENOSCOPY  07/07/2012   Procedure: ESOPHAGOGASTRODUODENOSCOPY (EGD);  Surgeon: Willis Modena, MD;  Location: [facility];  Service: Endoscopy;  Laterality: N/A;   GLAUCOMA SURGERY Bilateral 02/17/2022   HIP SURGERY     2 rods were put on left hip   LEFT COLECTOMY  04/30/2011   Sr Streck   LEFT HEART CATH AND CORONARY ANGIOGRAPHY N/A 06/08/2022   Procedure: LEFT HEART CATH AND CORONARY ANGIOGRAPHY;  Surgeon: Lennette Bihari, MD;  Location: [facility];  Service: Cardiovascular;  Laterality: N/A;   tubal cyst  04/30/2011   L fallopian tube cyst incidentally found   Patient Active Problem List   Diagnosis Date Noted   Non-ST elevation (NSTEMI)  myocardial infarction (HCC) 06/06/2022   Elevated troponin 06/05/2022   Hiatal hernia 06/05/2022   Elevated d-dimer 06/05/2022   Abnormal urinalysis 06/05/2022   HLD (hyperlipidemia) 06/05/2022   History of stroke 06/05/2022   Chronic kidney disease, stage 3b (HCC) 06/05/2022   Uncontrolled hypertension 12/30/2021   Glaucoma 12/29/2021   Acute ischemic stroke (HCC) 12/28/2021   Chronic pain of right knee 01/15/2020   Unilateral primary osteoarthritis, left knee 01/25/2018   Unilateral primary osteoarthritis, right knee 01/25/2018   Chronic pain of both knees 01/25/2018   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HCC) 12/22/2010    PCP: Daisy Floro, MD  REFERRING PROVIDER: Huel Cote, MD   REFERRING DIAG: W19.XXXD (ICD-10-CM) - Fall, subsequent encounter (Referral comment: "Gait training  Aquatic therapy")  Rationale for Evaluation and Treatment: Rehabilitation  THERAPY DIAG:  Unsteady gait  Abnormal posture  Difficulty in walking, not elsewhere classified  Muscle weakness (generalized)  ONSET DATE: 10/2022  SUBJECTIVE:  SUBJECTIVE STATEMENT: I am doing my exercises at home.     PERTINENT HISTORY:  CKD, osteopenia, SBO, hx MI, HTN, hx CVA June 2023  L hip ORIF medullary nailing s/p fall DC home health 10/2021.   PAIN:  Are you having pain: no resting , moderate pain while standing Location/description: NA Best-worst over past week: NA   - aggravating factors: NA  - Easing factors: NA     PRECAUTIONS: Right eye blindness, left eye glaucoma with impairment, cardiac hx, fall risk, osteopenia  WEIGHT BEARING RESTRICTIONS: No  FALLS:  Has patient fallen in last 6 months? Yes. Number of falls 1  LIVING ENVIRONMENT: Lives with: lives with their son Lives in: House/apartment Stairs:  Yes: External: 2 steps; on right going up Has following equipment at home: Quad cane large base, Environmental consultant - 4 wheeled, and Wheelchair (manual)  OCCUPATION: Not  PLOF: Requires assistive device for independence, Needs assistance with ADLs, Needs assistance with homemaking, Needs assistance with gait, and Leisure: reads the newspaper, light cleaning, plants  Daughter reports the patient is independent with ambulation in her home using a quad cane but she is not without 24-hour supervision.   PATIENT GOALS: I want to be able to walk better and move like I used to be  NEXT MD VISIT: Multiple visits as needed  OBJECTIVE:   DIAGNOSTIC FINDINGS:     MRI brain (without) demonstrating: - Moderate atrophy and moderate chronic small vessel ischemic disease. - Few chronic cerebral microhemorrhages. - No acute findings. No change from 12/29/21.    COMPARISON:  01/29/2023   FINDINGS: Stable hardware is noted in the proximal left femur. No acute fracture or dislocation is noted. No soft tissue changes are seen. Significant degenerative changes about the left knee joint are noted.   IMPRESSION: Postsurgical changes in the left hip. Degenerative changes in the left knee. The overall appearance is stable from the prior exam.        SURVEYS:  NT on eval   SCREENING FOR RED FLAGS: Red flag questioning/screening reassuring NEG   COGNITION: Overall cognitive status: History of cognitive impairments - at baseline     SENSATION: WFL  POSTURE: Posterior leanrounded shoulders, forward head, increased thoracic kyphosis, flexed trunk , and posterior lean  PALPATION: N/A   LOWER EXTREMITY ROM:     Active  Right eval Left eval Rt./Lt.  06/22/23  Hip flexion     Hip extension     Ankle DF  Lacks 8 deg  Lacks 8 deg  Approx 10 deg bilateral   Hip external rotation     Knee extension   -15  Knee flexion   105  (Blank rows = not tested) (Key: WFL = within functional limits not formally  assessed, * = concordant pain, s = stiffness/stretching sensation, NT = not tested)  Comments:    LOWER EXTREMITY MMT:    MMT Right eval Left eval Rt./lt. 06/22/23  Hip flexion 4 4 4   Hip abduction (modified sitting)     Hip internal rotation      Hip external rotation     Knee flexion 4+ 4+ 4+  Knee extension 4+ 4+ 4+  Ankle dorsiflexion   3-/5   (Blank rows = not tested) (Key: WFL = within functional limits not formally assessed, * = concordant pain, s = stiffness/stretching sensation, NT = not tested)  Comments:     FUNCTIONAL TESTS:  5 times sit to stand: 28 sec  with min A and hands on  the chair 2 minute walk test: 100 feet    06/22/23 2 min walk test 137 feet with rolling walker, min A     08/09/23 5xSTS with BUE 22.97   08/17/23: 5x sit to stand: 22.30 with bil UE support and legs on back of chair     TUG: 33.47 seconds with walker    2 min walk test: 138 feet with rolling walker  GAIT: Distance walked: 138 Assistive device utilized: Quad cane large base Level of assistance: Min A Comments: Posterior shortened step length/shuffling   TODAY'S TREATMENT:       [facility] Adult PT Treatment:                                                DATE: 08/30/2023 Therapeutic Exercise: NuStep: level 5x 8 minutes -PT present to discuss progress and provide cues for speed Standing shoulder extension: yellow band 2x10 Seated:  Ball squeeze 5" x20 Seated long arc quad 3# added 2x10 Seated march 2x10 3# added  Hip abduction with blue band x20 Seated hamstring curls with blue loop 2x10 bil each  Alternating cross punches: 1# x20  Therapeutic activity/neuro re-ed :  Gait with walker 2x25 feet  Weight shifting on foam balance pad-3 ways  Sit to stand: UE support 2x5 with max cues for technique Step up on to foam pad x10 Rt and Lt  [facility] Adult PT Treatment:                                                DATE: 08/25/2023 Therapeutic Exercise: NuStep: level 5x 8 minutes -PT present to  discuss progress and provide cues for speed Seated: cross punches with 1# weights 2x10 with PT hands as targets, overhead press with 1# weights 2x10 each Seated:  Ball squeeze 5" x20 Seated long arc quad 3# added 2x10 Seated march 2x10 3# added  Hip abduction with blue band x20  Pre gait: weight shifting anterior/posterior, step forward to pod on floor: 1 UE on barre Gait with walker 2x25 feet   [facility] Adult PT Treatment:                                                DATE: 08/23/2023 Therapeutic Exercise: NuStep: level 5x 8 minutes -PT present to discuss progress Seated: cross punches with 1# weights 2x10 with PT hands as targets, overhead press with 1# weights 2x10 each Seated:  Ball squeeze 5" x20 Seated long arc quad 2# added 2x10 Seated march 2x10 Standing heel raises 2x10  Pre gait: weight shifting anterior/posterior, step forward to pod on floor: 1 UE on barre, hand held assistance with other UE Gait along barre with focus on step length  PATIENT EDUCATION:  Education details: rationale for interventions, HEP  Person educated: Patient Education method: Explanation, Demonstration, Tactile cues, Verbal cues Education comprehension: verbalized understanding, returned demonstration, verbal cues required, tactile cues required, and needs further education     HOME EXERCISE PROGRAM: Access Code: [email or web address] URL: [email or web address] Date: 07/13/2023 Prepared by: Fransisco Hertz  Program Notes - with weight shifts, please use counter or  rolling walker for upper body support, chair nearby for seated rest. would also recommend caregiver supervision/assist  Exercises - Seated Hip Abduction  - 2-3 x daily - 1 sets - 8 reps - Seated Heel Toe Raises  - 2-3 x daily - 1 sets - 12 reps - Seated March  - 2-3 x daily - 1 sets - 5 reps - Seated Long Arc Quad  - 2-3 x daily - 1 sets - 8 reps - Side to Side Weight Shift with Counter Support  - 2-3 x daily - 1 sets - 5  reps  ASSESSMENT:  CLINICAL IMPRESSION: Pt continues to require moderate cueing throughout session due to vision deficits and PT provided supervision to monitor for fatigue and technique. She tolerated more exercises in standing today and was appropriately fatigued.  Pt requires max cueing for sit to stand transition.  She uses the backs of her legs on the chair and demonstrates uncontrolled descent with stand to sit.  PT monitored patient for safety and pain throughout session. Patient will benefit from skilled PT to address the below impairments and improve overall function.   OBJECTIVE IMPAIRMENTS: Abnormal gait, decreased activity tolerance, decreased balance, decreased cognition, decreased coordination, decreased endurance, decreased knowledge of condition, decreased knowledge of use of DME, decreased mobility, difficulty walking, decreased ROM, decreased strength, decreased safety awareness, increased edema, increased fascial restrictions, impaired perceived functional ability, impaired flexibility, impaired vision/preception, improper body mechanics, and postural dysfunction.   ACTIVITY LIMITATIONS: carrying, lifting, bending, standing, squatting, stairs, transfers, bed mobility, and locomotion level  PARTICIPATION LIMITATIONS: meal prep, cleaning, shopping, and community activity  PERSONAL FACTORS: Age, Time since onset of injury/illness/exacerbation, and 3+ comorbidities: visual deficits, osteopenia, cognition  are also affecting patient's functional outcome.   REHAB POTENTIAL: Fair given severity of deficits, comorbidities  CLINICAL DECISION MAKING: Evolving/moderate complexity  EVALUATION COMPLEXITY: Moderate   GOALS: Goals reviewed with patient? No - on eval did discuss role of PT, PT POC  SHORT TERM GOALS: Target date: 07/20/2023   Pt will demonstrate appropriate understanding and performance of initially prescribed HEP in order to facilitate improved independence with  management of symptoms.  Baseline: HEP with moderate compliance (08/17/23) Goal status: ongoing   2. Improve 5x sit to stand time to < or = to 17 seconds with bil UE support to reduce falls risk  Baseline:22.30 with bil UE support  (08/17/23)  Goal status:NEW  LONG TERM GOALS: Target date:10/12/23   Improve 5x sit to stand time to < or = to 17 seconds without UE support reduce falls risk  Baseline: 22.30 with bil UE support  (08/17/23) Goal status: INITIAL  2.  Pt will be able to perform sit to stand w/ safe mechanics and LRAD assistive device in order to facilitate improved safety w/ transfers. Baseline: requires verbal cues for safety (08/17/23) Goal status: In progress   3.  Pt will be able to ambulate 150 feet mod I  LRAD in order to facilitate improved safety w/ household navigation. Baseline: 138 feet (08/17/23)  Goal status:ongoing   4. Pt will demonstrate appropriate performance of final prescribed HEP in order to facilitate improved self-management of symptoms post-discharge.   Baseline: initial HEP prescribed  Goal status: INITIAL    5. Perform TUG in < or = to 25 seconds to reduce falls risk   Baseline: 33.47 sec   Goal status: In progress   PLAN:  PT FREQUENCY: 1-2x/week  PT DURATION: 8 weeks  PLANNED INTERVENTIONS: Therapeutic exercises, Therapeutic activity, Neuromuscular  re-education, Balance training, Gait training, Patient/Family education, Self Care, Joint mobilization, DME instructions, Aquatic Therapy, and Re-evaluation.  PLAN FOR NEXT SESSION:  work on longer step length, pregait activities, work on sit to stand. Retest sit to stand and TUG   Lorrene Reid, PT 08/30/23 2:45 PM

## 2023-09-01 ENCOUNTER — Ambulatory Visit: Payer: Medicare Other

## 2023-09-01 ENCOUNTER — Telehealth: Payer: Self-pay | Admitting: Nurse Practitioner

## 2023-09-01 DIAGNOSIS — R262 Difficulty in walking, not elsewhere classified: Secondary | ICD-10-CM

## 2023-09-01 DIAGNOSIS — R2681 Unsteadiness on feet: Secondary | ICD-10-CM | POA: Diagnosis not present

## 2023-09-01 DIAGNOSIS — M6281 Muscle weakness (generalized): Secondary | ICD-10-CM | POA: Diagnosis not present

## 2023-09-01 DIAGNOSIS — R293 Abnormal posture: Secondary | ICD-10-CM

## 2023-09-01 DIAGNOSIS — M542 Cervicalgia: Secondary | ICD-10-CM | POA: Diagnosis not present

## 2023-09-01 MED ORDER — METOPROLOL TARTRATE 25 MG PO TABS: 12.5000 mg | ORAL_TABLET | Freq: Two times a day (BID) | ORAL | 2 refills | Status: DC

## 2023-09-01 NOTE — Telephone Encounter (Signed)
 Pt's medication was sent to pt's pharmacy as requested. Confirmation received.

## 2023-09-01 NOTE — Therapy (Signed)
 OUTPATIENT PHYSICAL THERAPY TREATMENT   Patient Name: Laurie [name] MRN: [id number] DOB:06/07/1932, 89 y.o., female Today's Date: 09/01/2023    END OF SESSION:  PT End of Session - 09/01/23 1445     Visit Number 15    Date for PT Re-Evaluation 10/12/23    Authorization Type [employer] MCR    Authorization Time Period 8 visits-08/17/2023-09/14/2023-auth#[id number]    Authorization - Visit Number 5    Authorization - Number of Visits 8    Progress Note Due on Visit 20    PT Start Time 1403    PT Stop Time 1447    PT Time Calculation (min) 44 min    Activity Tolerance Patient tolerated treatment well    Behavior During Therapy WFL for tasks assessed/performed                  Past Medical History:  Diagnosis Date   CKD (chronic kidney disease), stage III (HCC)    Family history of breast cancer    mother   Family history of colon cancer    father   Glaucoma    Osteopenia    SBO (small bowel obstruction) (HCC)    Past Surgical History:  Procedure Laterality Date   APPENDECTOMY     CESAREAN SECTION     x6   CORONARY STENT INTERVENTION N/A 06/08/2022   Procedure: CORONARY STENT INTERVENTION;  Surgeon: Burnard Debby LABOR, MD;  Location: [facility];  Service: Cardiovascular;  Laterality: N/A;   ESOPHAGOGASTRODUODENOSCOPY  07/07/2012   Procedure: ESOPHAGOGASTRODUODENOSCOPY (EGD);  Surgeon: Elsie Cree, MD;  Location: [facility] ENDOSCOPY;  Service: Endoscopy;  Laterality: N/A;   GLAUCOMA SURGERY Bilateral 02/17/2022   HIP SURGERY     2 rods were put on left hip   LEFT COLECTOMY  04/30/2011   Sr Streck   LEFT HEART CATH AND CORONARY ANGIOGRAPHY N/A 06/08/2022   Procedure: LEFT HEART CATH AND CORONARY ANGIOGRAPHY;  Surgeon: Burnard Debby LABOR, MD;  Location: [facility];  Service: Cardiovascular;  Laterality: N/A;   tubal cyst  04/30/2011   L fallopian tube cyst incidentally found   Patient Active Problem List   Diagnosis Date Noted   Non-ST elevation (NSTEMI)  myocardial infarction (HCC) 06/06/2022   Elevated troponin 06/05/2022   Hiatal hernia 06/05/2022   Elevated d-dimer 06/05/2022   Abnormal urinalysis 06/05/2022   HLD (hyperlipidemia) 06/05/2022   History of stroke 06/05/2022   Chronic kidney disease, stage 3b (HCC) 06/05/2022   Uncontrolled hypertension 12/30/2021   Glaucoma 12/29/2021   Acute ischemic stroke (HCC) 12/28/2021   Chronic pain of right knee 01/15/2020   Unilateral primary osteoarthritis, left knee 01/25/2018   Unilateral primary osteoarthritis, right knee 01/25/2018   Chronic pain of both knees 01/25/2018   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HCC) 12/22/2010    PCP: Okey Carlin Redbird, MD  REFERRING PROVIDER: Genelle Standing, MD   REFERRING DIAG: W19.XXXD (ICD-10-CM) - Fall, subsequent encounter (Referral comment: Gait training  Aquatic therapy)  Rationale for Evaluation and Treatment: Rehabilitation  THERAPY DIAG:  Unsteady gait  Abnormal posture  Difficulty in walking, not elsewhere classified  Muscle weakness (generalized)  Cervicalgia  ONSET DATE: 10/2022  SUBJECTIVE:  SUBJECTIVE STATEMENT: I am doing my exercises at home.     PERTINENT HISTORY:  CKD, osteopenia, SBO, hx MI, HTN, hx CVA June 2023  L hip ORIF medullary nailing s/p fall DC home health 10/2021.   PAIN:  Are you having pain: no resting , moderate pain while standing Location/description: NA Best-worst over past week: NA   - aggravating factors: NA  - Easing factors: NA     PRECAUTIONS: Right eye blindness, left eye glaucoma with impairment, cardiac hx, fall risk, osteopenia  WEIGHT BEARING RESTRICTIONS: No  FALLS:  Has patient fallen in last 6 months? Yes. Number of falls 1  LIVING ENVIRONMENT: Lives with: lives with their son Lives in:  House/apartment Stairs: Yes: External: 2 steps; on right going up Has following equipment at home: Quad cane large base, Environmental Consultant - 4 wheeled, and Wheelchair (manual)  OCCUPATION: Not  PLOF: Requires assistive device for independence, Needs assistance with ADLs, Needs assistance with homemaking, Needs assistance with gait, and Leisure: reads the newspaper, light cleaning, plants  Daughter reports the patient is independent with ambulation in her home using a quad cane but she is not without 24-hour supervision.   PATIENT GOALS: I want to be able to walk better and move like I used to be  NEXT MD VISIT: Multiple visits as needed  OBJECTIVE:   DIAGNOSTIC FINDINGS:     MRI brain (without) demonstrating: - Moderate atrophy and moderate chronic small vessel ischemic disease. - Few chronic cerebral microhemorrhages. - No acute findings. No change from 12/29/21.    COMPARISON:  01/29/2023   FINDINGS: Stable hardware is noted in the proximal left femur. No acute fracture or dislocation is noted. No soft tissue changes are seen. Significant degenerative changes about the left knee joint are noted.   IMPRESSION: Postsurgical changes in the left hip. Degenerative changes in the left knee. The overall appearance is stable from the prior exam.        SURVEYS:  NT on eval   SCREENING FOR RED FLAGS: Red flag questioning/screening reassuring NEG   COGNITION: Overall cognitive status: History of cognitive impairments - at baseline     SENSATION: WFL  POSTURE: Posterior leanrounded shoulders, forward head, increased thoracic kyphosis, flexed trunk , and posterior lean  PALPATION: N/A   LOWER EXTREMITY ROM:     Active  Right eval Left eval Rt./Lt.  06/22/23  Hip flexion     Hip extension     Ankle DF  Lacks 8 deg  Lacks 8 deg  Approx 10 deg bilateral   Hip external rotation     Knee extension   -15  Knee flexion   105  (Blank rows = not tested) (Key: WFL = within  functional limits not formally assessed, * = concordant pain, s = stiffness/stretching sensation, NT = not tested)  Comments:    LOWER EXTREMITY MMT:    MMT Right eval Left eval Rt./lt. 06/22/23  Hip flexion 4 4 4   Hip abduction (modified sitting)     Hip internal rotation      Hip external rotation     Knee flexion 4+ 4+ 4+  Knee extension 4+ 4+ 4+  Ankle dorsiflexion   3-/5   (Blank rows = not tested) (Key: WFL = within functional limits not formally assessed, * = concordant pain, s = stiffness/stretching sensation, NT = not tested)  Comments:     FUNCTIONAL TESTS:  5 times sit to stand: 28 sec  with min A and hands on  the chair 2 minute walk test: 100 feet    06/22/23 2 min walk test 137 feet with rolling walker, min A     08/09/23 5xSTS with BUE 22.97   08/17/23: 5x sit to stand: 22.30 with bil UE support and legs on back of chair     TUG: 33.47 seconds with walker    2 min walk test: 138 feet with rolling walker   09/01/23: TUG: 28.74 seconds  GAIT: Distance walked: 138 Assistive device utilized: Quad cane large base Level of assistance: Min A Comments: Posterior shortened step length/shuffling   TODAY'S TREATMENT:      [facility] Adult PT Treatment:                                                DATE: 09/01/2023 Therapeutic Exercise: NuStep: level 5x 6 minutes -PT present to discuss progress and provide cues for speed Standing shoulder extension: yellow band 2x10 Seated:  Ball squeeze 5 x20 Seated long arc quad 3# added 2x10 Seated march up and over line on the floor 2x10 3# added  Hip abduction with blue band x20 Seated hamstring curls with blue loop 2x10 bil each  Alternating cross punches: 1# x20   Therapeutic activity/neuro re-ed :  Gait with walker 2x25 feet  Weight shifting on foam balance pad-3 ways  Sit to stand: UE support 2x5 with max cues for technique Step up on to foam pad x10 Rt and Lt [facility] Adult PT Treatment:                                                 DATE: 08/30/2023 Therapeutic Exercise: NuStep: level 5x 8 minutes -PT present to discuss progress and provide cues for speed Standing shoulder extension: yellow band 2x10 Seated:  Ball squeeze 5 x20 Seated long arc quad 3# added 2x10 Seated march 2x10 3# added  Seated hamstring curls with blue loop 2x10 bil each  Alternating cross punches: 1# x20  Therapeutic activity/neuro re-ed :  Step forward to pod on floor x10 each leg to improve step length and heel strike Weight shifting on foam balance pad-3 ways  Sit to stand: UE support 2x5 with max cues for technique Step up on to foam pad x10 Rt and Lt  [facility] Adult PT Treatment:                                                DATE: 08/25/2023 Therapeutic Exercise: NuStep: level 5x 8 minutes -PT present to discuss progress and provide cues for speed Seated: cross punches with 1# weights 2x10 with PT hands as targets, overhead press with 1# weights 2x10 each Seated:  Ball squeeze 5 x20 Seated long arc quad 3# added 2x10 Seated march 2x10 3# added  Hip abduction with blue band x20  Pre gait: weight shifting anterior/posterior, step forward to pod on floor: 1 UE on barre Gait with walker 2x25 feet     PATIENT EDUCATION:  Education details: rationale for interventions, HEP  Person educated: Patient Education method: Explanation, Demonstration, Tactile cues, Verbal cues Education comprehension: verbalized understanding,  returned demonstration, verbal cues required, tactile cues required, and needs further education     HOME EXERCISE PROGRAM: Access Code: [email or web address] URL: [email or web address] Date: 07/13/2023 Prepared by: Alm Jenny  Program Notes - with weight shifts, please use counter or rolling walker for upper body support, chair nearby for seated rest. would also recommend caregiver supervision/assist  Exercises - Seated Hip Abduction  - 2-3 x daily - 1 sets - 8 reps - Seated Heel Toe Raises  - 2-3 x daily  - 1 sets - 12 reps - Seated March  - 2-3 x daily - 1 sets - 5 reps - Seated Long Arc Quad  - 2-3 x daily - 1 sets - 8 reps - Side to Side Weight Shift with Counter Support  - 2-3 x daily - 1 sets - 5 reps  ASSESSMENT:  CLINICAL IMPRESSION: Pt continues to require moderate cueing throughout session due to vision deficits and PT provided supervision to monitor for fatigue and technique. She tolerated more exercises in standing today and was appropriately fatigued.  Pt requires max cueing for sit to stand transition and PT had difficulty testing 5x sit to stand today due to max use of legs with this transition.  She does demonstrate longer step length with fewer cues and TUG time is improved.   PT monitored patient for safety and pain throughout session. Patient will benefit from skilled PT to address the below impairments and improve overall function.   OBJECTIVE IMPAIRMENTS: Abnormal gait, decreased activity tolerance, decreased balance, decreased cognition, decreased coordination, decreased endurance, decreased knowledge of condition, decreased knowledge of use of DME, decreased mobility, difficulty walking, decreased ROM, decreased strength, decreased safety awareness, increased edema, increased fascial restrictions, impaired perceived functional ability, impaired flexibility, impaired vision/preception, improper body mechanics, and postural dysfunction.   ACTIVITY LIMITATIONS: carrying, lifting, bending, standing, squatting, stairs, transfers, bed mobility, and locomotion level  PARTICIPATION LIMITATIONS: meal prep, cleaning, shopping, and community activity  PERSONAL FACTORS: Age, Time since onset of injury/illness/exacerbation, and 3+ comorbidities: visual deficits, osteopenia, cognition  are also affecting patient's functional outcome.   REHAB POTENTIAL: Fair given severity of deficits, comorbidities  CLINICAL DECISION MAKING: Evolving/moderate complexity  EVALUATION COMPLEXITY:  Moderate   GOALS: Goals reviewed with patient? No - on eval did discuss role of PT, PT POC  SHORT TERM GOALS: Target date: 07/20/2023   Pt will demonstrate appropriate understanding and performance of initially prescribed HEP in order to facilitate improved independence with management of symptoms.  Baseline: HEP with moderate compliance (08/17/23) Goal status: ongoing   2. Improve 5x sit to stand time to < or = to 17 seconds with bil UE support to reduce falls risk  Baseline:22.30 with bil UE support  (08/17/23)  Goal status:NEW  LONG TERM GOALS: Target date:10/12/23   Improve 5x sit to stand time to < or = to 17 seconds without UE support reduce falls risk  Baseline: 22.30 with bil UE support  (08/17/23) Goal status: INITIAL  2.  Pt will be able to perform sit to stand w/ safe mechanics and LRAD assistive device in order to facilitate improved safety w/ transfers. Baseline: requires verbal cues for safety (08/17/23) Goal status: In progress   3.  Pt will be able to ambulate 150 feet mod I  LRAD in order to facilitate improved safety w/ household navigation. Baseline: 138 feet (08/17/23)  Goal status:ongoing   4. Pt will demonstrate appropriate performance of final prescribed HEP in order to facilitate improved self-management of symptoms post-discharge.  Baseline: initial HEP prescribed  Goal status: INITIAL    5. Perform TUG in < or = to 25 seconds to reduce falls risk   Baseline: 28.74 (09/01/23)  Goal status: In progress   PLAN:  PT FREQUENCY: 1-2x/week  PT DURATION: 8 weeks  PLANNED INTERVENTIONS: Therapeutic exercises, Therapeutic activity, Neuromuscular re-education, Balance training, Gait training, Patient/Family education, Self Care, Joint mobilization, DME instructions, Aquatic Therapy, and Re-evaluation.  PLAN FOR NEXT SESSION:  work on longer step length, pregait activities, work on sit to stand.   Burnard Joy, PT 09/01/23 2:47 PM

## 2023-09-01 NOTE — Telephone Encounter (Signed)
*  STAT* If patient is at the pharmacy, call can be transferred to refill team.   1. Which medications need to be refilled? (please list name of each medication and dose if known)  metoprolol  tartrate (LOPRESSOR ) 25 MG tablet  2. Which pharmacy/location (including street and city if local pharmacy) is medication to be sent to? [facility] [id number] - , [place] - [address]  3. Do they need a 30 day or 90 day supply?   90 day supply  Son states patient is almost out of medication.

## 2023-09-06 ENCOUNTER — Ambulatory Visit: Payer: Medicare Other

## 2023-09-06 DIAGNOSIS — R293 Abnormal posture: Secondary | ICD-10-CM | POA: Diagnosis not present

## 2023-09-06 DIAGNOSIS — R262 Difficulty in walking, not elsewhere classified: Secondary | ICD-10-CM | POA: Diagnosis not present

## 2023-09-06 DIAGNOSIS — R2681 Unsteadiness on feet: Secondary | ICD-10-CM

## 2023-09-06 DIAGNOSIS — M6281 Muscle weakness (generalized): Secondary | ICD-10-CM | POA: Diagnosis not present

## 2023-09-06 DIAGNOSIS — M542 Cervicalgia: Secondary | ICD-10-CM | POA: Diagnosis not present

## 2023-09-06 NOTE — Therapy (Signed)
 OUTPATIENT PHYSICAL THERAPY TREATMENT   Patient Name: Laurie [name] MRN: [id number] DOB:1931-12-17, 89 y.o., female Today's Date: 09/06/2023    END OF SESSION:  PT End of Session - 09/06/23 1410     Visit Number 16    Date for PT Re-Evaluation 10/12/23    Authorization Type [employer] MCR    Authorization Time Period 8 visits-08/17/2023-09/14/2023-auth#[id number]    Authorization - Visit Number 6    Authorization - Number of Visits 8    Progress Note Due on Visit 20    PT Start Time 1401    PT Stop Time 1446    PT Time Calculation (min) 45 min    Activity Tolerance Patient tolerated treatment well    Behavior During Therapy WFL for tasks assessed/performed                   Past Medical History:  Diagnosis Date   CKD (chronic kidney disease), stage III (HCC)    Family history of breast cancer    mother   Family history of colon cancer    father   Glaucoma    Osteopenia    SBO (small bowel obstruction) (HCC)    Past Surgical History:  Procedure Laterality Date   APPENDECTOMY     CESAREAN SECTION     x6   CORONARY STENT INTERVENTION N/A 06/08/2022   Procedure: CORONARY STENT INTERVENTION;  Surgeon: Millicent Ally, MD;  Location: [facility];  Service: Cardiovascular;  Laterality: N/A;   ESOPHAGOGASTRODUODENOSCOPY  07/07/2012   Procedure: ESOPHAGOGASTRODUODENOSCOPY (EGD);  Surgeon: Evangeline Hilts, MD;  Location: [facility] ENDOSCOPY;  Service: Endoscopy;  Laterality: N/A;   GLAUCOMA SURGERY Bilateral 02/17/2022   HIP SURGERY     2 rods were put on left hip   LEFT COLECTOMY  04/30/2011   Sr Streck   LEFT HEART CATH AND CORONARY ANGIOGRAPHY N/A 06/08/2022   Procedure: LEFT HEART CATH AND CORONARY ANGIOGRAPHY;  Surgeon: Millicent Ally, MD;  Location: [facility];  Service: Cardiovascular;  Laterality: N/A;   tubal cyst  04/30/2011   L fallopian tube cyst incidentally found   Patient Active Problem List   Diagnosis Date Noted   Non-ST elevation (NSTEMI)  myocardial infarction (HCC) 06/06/2022   Elevated troponin 06/05/2022   Hiatal hernia 06/05/2022   Elevated d-dimer 06/05/2022   Abnormal urinalysis 06/05/2022   HLD (hyperlipidemia) 06/05/2022   History of stroke 06/05/2022   Chronic kidney disease, stage 3b (HCC) 06/05/2022   Uncontrolled hypertension 12/30/2021   Glaucoma 12/29/2021   Acute ischemic stroke (HCC) 12/28/2021   Chronic pain of right knee 01/15/2020   Unilateral primary osteoarthritis, left knee 01/25/2018   Unilateral primary osteoarthritis, right knee 01/25/2018   Chronic pain of both knees 01/25/2018   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HCC) 12/22/2010    PCP: Jimmey Mould, MD  REFERRING PROVIDER: Wilhelmenia Harada, MD   REFERRING DIAG: W19.XXXD (ICD-10-CM) - Fall, subsequent encounter (Referral comment: "Gait training  Aquatic therapy")  Rationale for Evaluation and Treatment: Rehabilitation  THERAPY DIAG:  Unsteady gait  Abnormal posture  Difficulty in walking, not elsewhere classified  Muscle weakness (generalized)  Cervicalgia  ONSET DATE: 10/2022  SUBJECTIVE:  SUBJECTIVE STATEMENT: Sit to stand is getting easier according her pt's son.   PERTINENT HISTORY:  CKD, osteopenia, SBO, hx MI, HTN, hx CVA June 2023  L hip ORIF medullary nailing s/p fall DC home health 10/2021.   PAIN:  Are you having pain: no resting , moderate pain while standing Location/description: NA Best-worst over past week: NA   - aggravating factors: NA  - Easing factors: NA     PRECAUTIONS: Right eye blindness, left eye glaucoma with impairment, cardiac hx, fall risk, osteopenia  WEIGHT BEARING RESTRICTIONS: No  FALLS:  Has patient fallen in last 6 months? Yes. Number of falls 1  LIVING ENVIRONMENT: Lives with: lives with their  son Lives in: House/apartment Stairs: Yes: External: 2 steps; on right going up Has following equipment at home: Quad cane large base, Environmental consultant - 4 wheeled, and Wheelchair (manual)  OCCUPATION: Not  PLOF: Requires assistive device for independence, Needs assistance with ADLs, Needs assistance with homemaking, Needs assistance with gait, and Leisure: reads the newspaper, light cleaning, plants  Daughter reports the patient is independent with ambulation in her home using a quad cane but she is not without 24-hour supervision.   PATIENT GOALS: I want to be able to walk better and move like I used to be  NEXT MD VISIT: Multiple visits as needed  OBJECTIVE:   DIAGNOSTIC FINDINGS:     MRI brain (without) demonstrating: - Moderate atrophy and moderate chronic small vessel ischemic disease. - Few chronic cerebral microhemorrhages. - No acute findings. No change from 12/29/21.    COMPARISON:  01/29/2023   FINDINGS: Stable hardware is noted in the proximal left femur. No acute fracture or dislocation is noted. No soft tissue changes are seen. Significant degenerative changes about the left knee joint are noted.   IMPRESSION: Postsurgical changes in the left hip. Degenerative changes in the left knee. The overall appearance is stable from the prior exam.        SURVEYS:  NT on eval   SCREENING FOR RED FLAGS: Red flag questioning/screening reassuring NEG   COGNITION: Overall cognitive status: History of cognitive impairments - at baseline     SENSATION: WFL  POSTURE: Posterior leanrounded shoulders, forward head, increased thoracic kyphosis, flexed trunk , and posterior lean  PALPATION: N/A   LOWER EXTREMITY ROM:     Active  Right eval Left eval Rt./Lt.  06/22/23  Hip flexion     Hip extension     Ankle DF  Lacks 8 deg  Lacks 8 deg  Approx 10 deg bilateral   Hip external rotation     Knee extension   -15  Knee flexion   105  (Blank rows = not tested) (Key: WFL =  within functional limits not formally assessed, * = concordant pain, s = stiffness/stretching sensation, NT = not tested)  Comments:    LOWER EXTREMITY MMT:    MMT Right eval Left eval Rt./lt. 06/22/23  Hip flexion 4 4 4   Hip abduction (modified sitting)     Hip internal rotation      Hip external rotation     Knee flexion 4+ 4+ 4+  Knee extension 4+ 4+ 4+  Ankle dorsiflexion   3-/5   (Blank rows = not tested) (Key: WFL = within functional limits not formally assessed, * = concordant pain, s = stiffness/stretching sensation, NT = not tested)  Comments:     FUNCTIONAL TESTS:  5 times sit to stand: 28 sec  with min A and hands  on the chair 2 minute walk test: 100 feet    06/22/23 2 min walk test 137 feet with rolling walker, min A     08/09/23 5xSTS with BUE 22.97   08/17/23: 5x sit to stand: 22.30 with bil UE support and legs on back of chair     TUG: 33.47 seconds with walker    2 min walk test: 138 feet with rolling walker   09/01/23: TUG: 28.74 seconds  GAIT: Distance walked: 138 Assistive device utilized: Quad cane large base Level of assistance: Min A Comments: Posterior shortened step length/shuffling   TODAY'S TREATMENT:      [facility] Adult PT Treatment:                                                DATE: 09/06/2023 Therapeutic Exercise: NuStep: level 5x 6 minutes -PT present to discuss progress and provide cues for speed Standing shoulder extension: yellow band 2x10 Seated:  Seated hamstring curls with blue loop 2x10 bil each  Alternating cross punches: 1# x20   Therapeutic activity/neuro re-ed :  Gait with walker 2x50 feet-increased distance today Step over hurdles with step to and use of rail 3 hurdles, 2 laps  Weight shifting on foam balance pad-3 ways  Sit to stand: UE support 2x5 with moderate cueing and reduced use of legs on chair Step up on to foam pad x10 Rt and Lt Standing march to improve step height 2x10  [facility] Adult PT Treatment:                                                 DATE: 09/01/2023 Therapeutic Exercise: NuStep: level 5x 6 minutes -PT present to discuss progress and provide cues for speed Standing shoulder extension: yellow band 2x10 Seated:  Ball squeeze 5" x20 Seated long arc quad 3# added 2x10 Seated march up and over line on the floor 2x10 3# added  Hip abduction with blue band x20 Seated hamstring curls with blue loop 2x10 bil each  Alternating cross punches: 1# x20   Therapeutic activity/neuro re-ed :  Gait with walker 2x25 feet  Weight shifting on foam balance pad-3 ways  Sit to stand: UE support 2x5 with max cues for technique Step up on to foam pad x10 Rt and Lt [facility] Adult PT Treatment:                                                DATE: 08/30/2023 Therapeutic Exercise: NuStep: level 5x 8 minutes -PT present to discuss progress and provide cues for speed Standing shoulder extension: yellow band 2x10 Seated:  Ball squeeze 5" x20 Seated long arc quad 3# added 2x10 Seated march 2x10 3# added  Seated hamstring curls with blue loop 2x10 bil each  Alternating cross punches: 1# x20  Therapeutic activity/neuro re-ed :  Step forward to pod on floor x10 each leg to improve step length and heel strike Weight shifting on foam balance pad-3 ways  Sit to stand: UE support 2x5 with max cues for technique Step up on to foam pad x10 Rt and Lt  PATIENT EDUCATION:  Education details: rationale for interventions, HEP  Person educated: Patient Education method: Explanation, Demonstration, Tactile cues, Verbal cues Education comprehension: verbalized understanding, returned demonstration, verbal cues required, tactile cues required, and needs further education     HOME EXERCISE PROGRAM: Access Code: [email or web address] URL: [email or web address] Date: 07/13/2023 Prepared by: Mayme Spearman  Program Notes - with weight shifts, please use counter or rolling walker for upper body support, chair nearby for seated rest.  would also recommend caregiver supervision/assist  Exercises - Seated Hip Abduction  - 2-3 x daily - 1 sets - 8 reps - Seated Heel Toe Raises  - 2-3 x daily - 1 sets - 12 reps - Seated March  - 2-3 x daily - 1 sets - 5 reps - Seated Long Arc Quad  - 2-3 x daily - 1 sets - 8 reps - Side to Side Weight Shift with Counter Support  - 2-3 x daily - 1 sets - 5 reps  ASSESSMENT:  CLINICAL IMPRESSION: Pt with improved sit to stand and is consistently taking longer step length.  Pt tolerated more exercise in standing.   PT monitored patient for safety and pain throughout session. She is making good progress with mobility gains.  Patient will benefit from skilled PT to address the below impairments and improve overall function.   OBJECTIVE IMPAIRMENTS: Abnormal gait, decreased activity tolerance, decreased balance, decreased cognition, decreased coordination, decreased endurance, decreased knowledge of condition, decreased knowledge of use of DME, decreased mobility, difficulty walking, decreased ROM, decreased strength, decreased safety awareness, increased edema, increased fascial restrictions, impaired perceived functional ability, impaired flexibility, impaired vision/preception, improper body mechanics, and postural dysfunction.   ACTIVITY LIMITATIONS: carrying, lifting, bending, standing, squatting, stairs, transfers, bed mobility, and locomotion level  PARTICIPATION LIMITATIONS: meal prep, cleaning, shopping, and community activity  PERSONAL FACTORS: Age, Time since onset of injury/illness/exacerbation, and 3+ comorbidities: visual deficits, osteopenia, cognition  are also affecting patient's functional outcome.   REHAB POTENTIAL: Fair given severity of deficits, comorbidities  CLINICAL DECISION MAKING: Evolving/moderate complexity  EVALUATION COMPLEXITY: Moderate   GOALS: Goals reviewed with patient? No - on eval did discuss role of PT, PT POC  SHORT TERM GOALS: Target date:  07/20/2023   Pt will demonstrate appropriate understanding and performance of initially prescribed HEP in order to facilitate improved independence with management of symptoms.  Baseline: HEP with moderate compliance (08/17/23) Goal status: ongoing   2. Improve 5x sit to stand time to < or = to 17 seconds with bil UE support to reduce falls risk  Baseline:22.30 with bil UE support  (08/17/23)  Goal status:NEW  LONG TERM GOALS: Target date:10/12/23   Improve 5x sit to stand time to < or = to 17 seconds without UE support reduce falls risk  Baseline: 22.30 with bil UE support  (08/17/23) Goal status: INITIAL  2.  Pt will be able to perform sit to stand w/ safe mechanics and LRAD assistive device in order to facilitate improved safety w/ transfers. Baseline: requires verbal cues for safety (08/17/23) Goal status: In progress   3.  Pt will be able to ambulate 150 feet mod I  LRAD in order to facilitate improved safety w/ household navigation. Baseline: 138 feet (08/17/23)  Goal status:ongoing   4. Pt will demonstrate appropriate performance of final prescribed HEP in order to facilitate improved self-management of symptoms post-discharge.   Baseline: initial HEP prescribed  Goal status: INITIAL    5. Perform TUG in < or = to 25 seconds to  reduce falls risk   Baseline: 28.74 (09/01/23)  Goal status: In progress   PLAN:  PT FREQUENCY: 1-2x/week  PT DURATION: 8 weeks  PLANNED INTERVENTIONS: Therapeutic exercises, Therapeutic activity, Neuromuscular re-education, Balance training, Gait training, Patient/Family education, Self Care, Joint mobilization, DME instructions, Aquatic Therapy, and Re-evaluation.  PLAN FOR NEXT SESSION:  work on longer step length, pregait activities, work on sit to stand.   Luella Sager, PT 09/06/23 2:53 PM

## 2023-09-09 ENCOUNTER — Other Ambulatory Visit: Payer: Self-pay

## 2023-09-09 ENCOUNTER — Emergency Department (HOSPITAL_BASED_OUTPATIENT_CLINIC_OR_DEPARTMENT_OTHER)
Admission: EM | Admit: 2023-09-09 | Discharge: 2023-09-10 | Disposition: A | Discharge status: Home / Self Care | Payer: Medicare Other | Attending: Emergency Medicine | Admitting: Emergency Medicine

## 2023-09-09 ENCOUNTER — Emergency Department (HOSPITAL_BASED_OUTPATIENT_CLINIC_OR_DEPARTMENT_OTHER): Payer: Medicare Other | Admitting: Radiology

## 2023-09-09 ENCOUNTER — Encounter (HOSPITAL_BASED_OUTPATIENT_CLINIC_OR_DEPARTMENT_OTHER): Payer: Self-pay | Admitting: Emergency Medicine

## 2023-09-09 DIAGNOSIS — W228XXA Striking against or struck by other objects, initial encounter: Secondary | ICD-10-CM | POA: Insufficient documentation

## 2023-09-09 DIAGNOSIS — Z79899 Other long term (current) drug therapy: Secondary | ICD-10-CM | POA: Insufficient documentation

## 2023-09-09 DIAGNOSIS — I129 Hypertensive chronic kidney disease with stage 1 through stage 4 chronic kidney disease, or unspecified chronic kidney disease: Secondary | ICD-10-CM | POA: Diagnosis not present

## 2023-09-09 DIAGNOSIS — N183 Chronic kidney disease, stage 3 unspecified: Secondary | ICD-10-CM | POA: Insufficient documentation

## 2023-09-09 DIAGNOSIS — S8991XA Unspecified injury of right lower leg, initial encounter: Secondary | ICD-10-CM | POA: Diagnosis present

## 2023-09-09 DIAGNOSIS — M7989 Other specified soft tissue disorders: Secondary | ICD-10-CM | POA: Diagnosis not present

## 2023-09-09 DIAGNOSIS — Z8673 Personal history of transient ischemic attack (TIA), and cerebral infarction without residual deficits: Secondary | ICD-10-CM | POA: Diagnosis not present

## 2023-09-09 DIAGNOSIS — M1711 Unilateral primary osteoarthritis, right knee: Secondary | ICD-10-CM | POA: Diagnosis not present

## 2023-09-09 DIAGNOSIS — Z7982 Long term (current) use of aspirin: Secondary | ICD-10-CM | POA: Diagnosis not present

## 2023-09-09 DIAGNOSIS — S81811A Laceration without foreign body, right lower leg, initial encounter: Secondary | ICD-10-CM | POA: Diagnosis not present

## 2023-09-09 MED ORDER — LIDOCAINE-EPINEPHRINE (PF) 2 %-1:200000 IJ SOLN
20.0000 mL | Freq: Once | INTRAMUSCULAR | Status: DC
  Filled 2023-09-09: qty 20

## 2023-09-09 MED ORDER — NAPROXEN 250 MG PO TABS: 375.0000 mg | ORAL_TABLET | Freq: Once | ORAL | Status: DC

## 2023-09-09 MED ORDER — ACETAMINOPHEN 325 MG PO TABS
650.0000 mg | ORAL_TABLET | Freq: Once | ORAL | Status: AC
  Administered 2023-09-10: 650 mg via ORAL
  Filled 2023-09-09: qty 2

## 2023-09-09 NOTE — ED Notes (Signed)
Pt's dressing applied at home bled through while pt was waiting in lobby. Pt's dressing removed and new non-adhesive pad and gauze wrap applied.

## 2023-09-09 NOTE — ED Provider Notes (Signed)
EMERGENCY DEPARTMENT AT [facility] Provider Note   CSN: [id number] Arrival date & time: 09/09/23  2042     History {Add pertinent medical, surgical, social history, OB history to HPI:1} Chief Complaint  Patient presents with   Extremity Laceration    Laurie [name] is a 89 y.o. female with past medical history of hypertension, HLD, CVA, CKD stage III, NSTEMI presents emerged department for evaluation of right lower foot laceration.  She was at church when her grandson excellently hit her lower leg.  She presented to urgent care but was told that the skin tear was too extensive for them to repair.  She denies any other injury, pain.  She is fully alert and oriented.  She is not on thinners.  Last Tdap was less than 5 years ago.  HPI     Home Medications Prior to Admission medications   Medication Sig Start Date End Date Taking? Authorizing Provider  acetaminophen (TYLENOL) 500 MG tablet Take 1,000 mg by mouth every 6 (six) hours as needed.    [provider]  amLODipine (NORVASC) 2.5 MG tablet Take 2.5 mg by mouth daily. 05/28/22   [provider]  aspirin EC 81 MG tablet Take 1 tablet (81 mg total) by mouth daily. Swallow whole. 12/31/21   Danford, Earl Lites, MD  atorvastatin (LIPITOR) 40 MG tablet Take 1 tablet (40 mg total) by mouth daily. 12/31/21   Danford, Earl Lites, MD  brimonidine (ALPHAGAN) 0.2 % ophthalmic solution Place 1 drop into both eyes 3 (three) times daily. 12/23/21   [provider]  dorzolamide-timolol (COSOPT) 22.3-6.8 MG/ML ophthalmic solution 1 drop 2 (two) times daily.      [provider]  latanoprost (XALATAN) 0.005 % ophthalmic solution Place 1 drop into both eyes every evening. Patient not taking: Reported on 04/05/2023 12/23/21   [provider]  methocarbamol (ROBAXIN) 500 MG tablet Take 1 tablet (500 mg total) by mouth 3 (three) times daily. Patient not taking: Reported on 04/05/2023  08/07/22   Huel Cote, MD  metoprolol tartrate (LOPRESSOR) 25 MG tablet Take 0.5 tablets (12.5 mg total) by mouth 2 (two) times daily. 09/01/23   Joylene Grapes, NP  nitroGLYCERIN (NITROSTAT) 0.4 MG SL tablet Place 1 tablet (0.4 mg total) under the tongue every 5 (five) minutes as needed for chest pain. 06/09/22   Kathlen Mody, MD  OPTIVE 0.5-0.9 % ophthalmic solution Place 1 drop into both eyes 4 (four) times daily as needed for dry eyes. Patient not taking: Reported on 04/05/2023 12/23/21   [provider]  pantoprazole (PROTONIX) 40 MG tablet Take 1 tablet (40 mg total) by mouth daily. Patient not taking: Reported on 04/05/2023 06/10/22   Kathlen Mody, MD  RHOPRESSA 0.02 % SOLN Place 1 drop into both eyes every evening. 12/23/21   [provider]  ticagrelor (BRILINTA) 90 MG TABS tablet Take 1 tablet (90 mg total) by mouth 2 (two) times daily. 06/01/23 05/31/24  Joylene Grapes, NP      Allergies    Actonel [risedronate], Fosamax [alendronate], Miacalcin, Neomycin, Neosporin [neomycin-polymyxin-gramicidin], Neurontin [gabapentin], and Codeine    Review of Systems   Review of Systems  Constitutional:  Negative for chills, fatigue and fever.  Respiratory:  Negative for cough, chest tightness, shortness of breath and wheezing.   Cardiovascular:  Negative for chest pain and palpitations.  Gastrointestinal:  Negative for abdominal pain, constipation, diarrhea, nausea and vomiting.  Skin:  Positive for wound.  Neurological:  Negative  for dizziness, seizures, weakness, light-headedness, numbness and headaches.    Physical Exam Updated Vital Signs BP (!) 131/56 (BP Location: Left Wrist)   Pulse 68   Temp 97.6 F (36.4 C)   Resp 18   Ht 4\' 11"  (1.499 m)   Wt 58.9 kg   SpO2 98%   BMI 26.23 kg/m  Physical Exam Vitals and nursing note reviewed.  Constitutional:      General: She is not in acute distress.    Appearance: Normal appearance.  HENT:     Head: Normocephalic  and atraumatic.  Eyes:     Conjunctiva/sclera: Conjunctivae normal.  Cardiovascular:     Rate and Rhythm: Normal rate.     Pulses: Normal pulses.  Pulmonary:     Effort: Pulmonary effort is normal. No respiratory distress.     Breath sounds: Normal breath sounds.  Musculoskeletal:     Right lower leg: No edema.     Left lower leg: No edema.  Skin:    Capillary Refill: Capillary refill takes less than 2 seconds.     Coloration: Skin is not jaundiced or pale.     Comments: Crescent-shaped superficial skin tear to right anterior calf.  No surrounding warmth, erythremia.  Hemorrhage is controlled. Overlying ecchymosis  Neurological:     Mental Status: She is alert and oriented to person, place, and time. Mental status is at baseline.     ED Results / Procedures / Treatments   Labs (all labs ordered are listed, but only abnormal results are displayed) Labs Reviewed - No data to display  EKG None  Radiology DG Tibia/Fibula Right Result Date: 09/09/2023 CLINICAL DATA:  Laceration EXAM: RIGHT TIBIA AND FIBULA - 2 VIEW COMPARISON:  None Available. FINDINGS: No fracture or malalignment. Diffuse soft tissue swelling. Advanced degenerative changes at the knee. IMPRESSION: No acute osseous abnormality. Diffuse soft tissue swelling. Electronically Signed   By: Jasmine Pang M.D.   On: 09/09/2023 22:30    Procedures Procedures  {Document cardiac monitor, telemetry assessment procedure when appropriate:1}  Medications Ordered in ED Medications  lidocaine-EPINEPHrine (XYLOCAINE W/EPI) 2 %-1:200000 (PF) injection 20 mL (has no administration in time range)  acetaminophen (TYLENOL) tablet 650 mg (has no administration in time range)    ED Course/ Medical Decision Making/ A&P   {   Click here for ABCD2, HEART and other calculatorsREFRESH Note before signing :1}                              Medical Decision Making  Patient presents to the ED for concern of lac from RLE, this involves an  extensive number of treatment options, and is a complaint that carries with it a high risk of complications and morbidity.    Co morbidities that complicate the patient evaluation  None   Additional history obtained:  Additional history obtained from Nursing   External records from outside source obtained and reviewed including triage RN note    Medicines ordered and prescription drug management:  I ordered medication including lidocaine/epi and tylenol for pain     Reevaluation of the patient after these medicines showed that the patient improved I have reviewed the patients home medicines and have made adjustments as needed    Problem List / ED Course:  Laceration right lower extremity Will numb area and extensively cleaned with.  However it is very superficial and patient's skin is very thin. X-ray negative for fracture Will  provide Steri-Strips and prophylactic antibiotics Discussed ED workup, disposition, return to emerged part precautions with patient expressed understanding agrees with plan.  All questions answered to her satisfaction.  She is discharged.   Reevaluation:  After the interventions noted above, I reevaluated the patient and found that they have :improved    Dispostion:  After consideration of the diagnostic results and the patients response to treatment, I feel that the patent would benefit from outpatient management.   Dr. Manus Gunning individually assessed pt, reviewed XR and agrees with plan Final Clinical Impression(s) / ED Diagnoses Final diagnoses:  Laceration of right lower extremity, initial encounter    Rx / DC Orders ED Discharge Orders     None

## 2023-09-09 NOTE — ED Notes (Signed)
Appears to be bleeding through bottom of dressing.

## 2023-09-09 NOTE — ED Notes (Signed)
Pt asked for some eye drops. Pt was told that was something the Dr would have to see her and order. Pt was given a wet wash cloth to cover her eye for comfort.

## 2023-09-09 NOTE — ED Triage Notes (Signed)
Pt via pov from home with laceration to her right lower leg. Pt was hit by her grandson's foot; they went to UC but were told the laceration and skin tear was too extensive for them to repair. Pt wearing soft cervical collar in triage - this is not related to this visit. Pt alert & oriented, nad noted.

## 2023-09-10 MED ORDER — CEPHALEXIN 500 MG PO CAPS: 500.0000 mg | ORAL_CAPSULE | Freq: Two times a day (BID) | ORAL | 0 refills | Status: DC

## 2023-09-10 MED ORDER — CEPHALEXIN 250 MG PO CAPS
500.0000 mg | ORAL_CAPSULE | Freq: Once | ORAL | Status: AC
  Administered 2023-09-10: 500 mg via ORAL
  Filled 2023-09-10: qty 2

## 2023-09-10 NOTE — Discharge Instructions (Signed)
Thank you for letting us evaluate you today.  Your right lower leg x-ray was negative for fractures.  Please keep Steri-Strips on until they fall off which should be in a couple days.  Please keep wrap from ED on at least 24 hours.  You may follow-up with PCP regarding wound care.  Have sent some antibiotics to your pharmacy for you to take to prevent infection

## 2023-09-13 DIAGNOSIS — S81801D Unspecified open wound, right lower leg, subsequent encounter: Secondary | ICD-10-CM | POA: Diagnosis not present

## 2023-09-17 DIAGNOSIS — L03115 Cellulitis of right lower limb: Secondary | ICD-10-CM | POA: Diagnosis not present

## 2023-09-20 ENCOUNTER — Encounter: Payer: Self-pay | Admitting: Cardiology

## 2023-09-22 DIAGNOSIS — L0390 Cellulitis, unspecified: Secondary | ICD-10-CM | POA: Diagnosis not present

## 2023-09-22 DIAGNOSIS — J029 Acute pharyngitis, unspecified: Secondary | ICD-10-CM | POA: Diagnosis not present

## 2023-09-22 DIAGNOSIS — S81811S Laceration without foreign body, right lower leg, sequela: Secondary | ICD-10-CM | POA: Diagnosis not present

## 2023-09-22 DIAGNOSIS — L989 Disorder of the skin and subcutaneous tissue, unspecified: Secondary | ICD-10-CM | POA: Diagnosis not present

## 2023-09-27 DIAGNOSIS — Z09 Encounter for follow-up examination after completed treatment for conditions other than malignant neoplasm: Secondary | ICD-10-CM | POA: Diagnosis not present

## 2023-09-27 DIAGNOSIS — R609 Edema, unspecified: Secondary | ICD-10-CM | POA: Diagnosis not present

## 2023-10-01 NOTE — Progress Notes (Signed)
 HPI: Follow-up coronary artery disease.  MRA of the neck June 2023 showed no carotid stenosis and no intracranial large vessel occlusion.  CTA November 2023 showed no aneurysm or dissection.  Had non-ST elevation myocardial infarction November 2023.  Echocardiogram November 2023 showed ejection fraction 65 to 70%, mild left ventricular hypertrophy, grade 1 diastolic dysfunction.  Cardiac catheterization November 2023 showed 99% distal RCA and patient had successful PCI at that time.  There was a 20% narrowing in the LAD.  Since last seen patient denies dyspnea, chest pain, palpitations or syncope.  She continues to have lower extremity edema.  Current Outpatient Medications  Medication Sig Dispense Refill   acetaminophen (TYLENOL) 500 MG tablet Take 1,000 mg by mouth every 6 (six) hours as needed.     amLODipine (NORVASC) 2.5 MG tablet Take 2.5 mg by mouth daily.     aspirin EC 81 MG tablet Take 1 tablet (81 mg total) by mouth daily. Swallow whole. 30 tablet 12   atorvastatin (LIPITOR) 40 MG tablet Take 1 tablet (40 mg total) by mouth daily. 30 tablet 11   brimonidine (ALPHAGAN) 0.2 % ophthalmic solution Place 1 drop into both eyes 3 (three) times daily.     cyclobenzaprine (FLEXERIL) 5 MG tablet Take 5 mg by mouth as needed.     dorzolamide-timolol (COSOPT) 22.3-6.8 MG/ML ophthalmic solution 1 drop 2 (two) times daily.       doxycycline (VIBRAMYCIN) 100 MG capsule Take 100 mg by mouth 2 (two) times daily.     furosemide (LASIX) 20 MG tablet Take 20 mg by mouth in the morning, at noon, and at bedtime.     latanoprost (XALATAN) 0.005 % ophthalmic solution Place 1 drop into both eyes every evening.     memantine (NAMENDA) 5 MG tablet Take 1.5 tablets by mouth daily.     metoprolol tartrate (LOPRESSOR) 25 MG tablet Take 0.5 tablets (12.5 mg total) by mouth 2 (two) times daily. 90 tablet 2   nitroGLYCERIN (NITROSTAT) 0.4 MG SL tablet Place 1 tablet (0.4 mg total) under the tongue every 5 (five)  minutes as needed for chest pain. 30 tablet 12   No current facility-administered medications for this visit.     Past Medical History:  Diagnosis Date   CKD (chronic kidney disease), stage III (HCC)    Family history of breast cancer    mother   Family history of colon cancer    father   Glaucoma    Osteopenia    SBO (small bowel obstruction) (HCC)     Past Surgical History:  Procedure Laterality Date   APPENDECTOMY     CESAREAN SECTION     x6   CORONARY STENT INTERVENTION N/A 06/08/2022   Procedure: CORONARY STENT INTERVENTION;  Surgeon: Lennette Bihari, MD;  Location: [facility];  Service: Cardiovascular;  Laterality: N/A;   ESOPHAGOGASTRODUODENOSCOPY  07/07/2012   Procedure: ESOPHAGOGASTRODUODENOSCOPY (EGD);  Surgeon: Willis Modena, MD;  Location: [facility] ENDOSCOPY;  Service: Endoscopy;  Laterality: N/A;   GLAUCOMA SURGERY Bilateral 02/17/2022   HIP SURGERY     2 rods were put on left hip   LEFT COLECTOMY  04/30/2011   [facility]   LEFT HEART CATH AND CORONARY ANGIOGRAPHY N/A 06/08/2022   Procedure: LEFT HEART CATH AND CORONARY ANGIOGRAPHY;  Surgeon: Lennette Bihari, MD;  Location: [facility] CV [facility];  Service: Cardiovascular;  Laterality: N/A;   tubal cyst  04/30/2011   L fallopian tube cyst incidentally found  Social History   Socioeconomic History   Marital status: Widowed    Spouse name: Not on file   Number of children: 6   Years of education: Not on file   Highest education level: Not on file  Occupational History   Not on file  Tobacco Use   Smoking status: Former    Average packs/day: 0.5 packs/day for 68.0 years (34.0 ttl pk-yrs)    Types: Cigarettes    Start date: 1955   Smokeless tobacco: Not on file  Vaping Use   Vaping status: Never Used  Substance and Sexual Activity   Alcohol use: No    Comment: rare   Drug use: No   Sexual activity: Not Currently  Other Topics Concern   Not on file  Social History Narrative   Not on file    Social Drivers of Health   Financial Resource Strain: Low Risk  (11/20/2022)   Received from [facility], [facility]   Overall Financial Resource Strain (CARDIA)    Difficulty of Paying Living Expenses: Not hard at all  Food Insecurity: No Food Insecurity (11/20/2022)   Received from [facility], [facility]   Hunger Vital Sign    Worried About Running Out of Food in the Last Year: Never true    Ran Out of Food in the Last Year: Never true  Transportation Needs: No Transportation Needs (11/20/2022)   Received from [facility], [facility]   PRAPARE - Transportation    Lack of Transportation (Medical): No    Lack of Transportation (Non-Medical): No  Physical Activity: Not on file  Stress: Not on file  Social Connections: Unknown (01/07/2022)   Received [facility], [facility]   Social Network    Social Network: Not on file  Intimate Partner Violence: Not At Risk (11/20/2022)   Received from [facility], [facility], [facility]   Humiliation, Afraid, Rape, and Kick questionnaire    Fear of Current or Ex-Partner: No    Emotionally Abused: No    Physically Abused: No    Sexually Abused: No    Family History  Problem Relation Age of Onset   Breast cancer Mother    Colon cancer Father    Diabetes Sister     ROS: no fevers or chills, productive cough, hemoptysis, dysphasia, odynophagia, melena, hematochezia, dysuria, hematuria, rash, seizure activity, orthopnea, PND, pedal edema, claudication. Remaining systems are negative.  Physical Exam: Well-developed well-nourished in no acute distress.  Skin is warm and dry.  HEENT is normal.  Neck is supple.  Chest is clear to auscultation with normal expansion.  Cardiovascular exam is regular rate and rhythm.  Abdominal exam nontender or distended. No masses  palpated. Extremities show 1+ edema. neuro grossly intact  A/P  1 coronary artery disease status post PCI of the right coronary artery-continue aspirin.  Brilinta recently discontinued.  Continue Lipitor.  2 hypertension-patient's blood pressure is controlled.  She is on a very small dose of amlodipine.  I will discontinue this to see if it is contributing to lower extremity edema.  Follow blood pressure and adjust regimen as needed.  3 hyperlipidemia-continue Lipitor.  Check lipids and liver.  4 lower extremity edema-continue Lasix at present dose.  Symptoms are reasonly well-controlled.  Check potassium and renal function.  5 history of CVA-continue aspirin and statin.  6 chronic stage IIIb kidney disease-check renal function.  Olga Millers, MD

## 2023-10-04 DIAGNOSIS — R609 Edema, unspecified: Secondary | ICD-10-CM | POA: Diagnosis not present

## 2023-10-13 DIAGNOSIS — L97909 Non-pressure chronic ulcer of unspecified part of unspecified lower leg with unspecified severity: Secondary | ICD-10-CM | POA: Diagnosis not present

## 2023-10-13 DIAGNOSIS — L0390 Cellulitis, unspecified: Secondary | ICD-10-CM | POA: Diagnosis not present

## 2023-10-13 DIAGNOSIS — R609 Edema, unspecified: Secondary | ICD-10-CM | POA: Diagnosis not present

## 2023-10-15 ENCOUNTER — Encounter: Payer: Self-pay | Admitting: Cardiology

## 2023-10-15 ENCOUNTER — Ambulatory Visit: Payer: Medicare Other | Attending: Cardiology | Admitting: Cardiology

## 2023-10-15 VITALS — BP 104/60 | HR 77 | Resp 16 | Ht 59.0 in | Wt 126.0 lb

## 2023-10-15 DIAGNOSIS — I10 Essential (primary) hypertension: Secondary | ICD-10-CM

## 2023-10-15 DIAGNOSIS — N1832 Chronic kidney disease, stage 3b: Secondary | ICD-10-CM

## 2023-10-15 DIAGNOSIS — I2510 Atherosclerotic heart disease of native coronary artery without angina pectoris: Secondary | ICD-10-CM | POA: Diagnosis not present

## 2023-10-15 DIAGNOSIS — E785 Hyperlipidemia, unspecified: Secondary | ICD-10-CM

## 2023-10-15 DIAGNOSIS — Z556 Problems related to health literacy: Secondary | ICD-10-CM | POA: Diagnosis not present

## 2023-10-15 DIAGNOSIS — I129 Hypertensive chronic kidney disease with stage 1 through stage 4 chronic kidney disease, or unspecified chronic kidney disease: Secondary | ICD-10-CM | POA: Diagnosis not present

## 2023-10-15 DIAGNOSIS — I252 Old myocardial infarction: Secondary | ICD-10-CM | POA: Diagnosis not present

## 2023-10-15 DIAGNOSIS — N184 Chronic kidney disease, stage 4 (severe): Secondary | ICD-10-CM | POA: Diagnosis not present

## 2023-10-15 DIAGNOSIS — Z87891 Personal history of nicotine dependence: Secondary | ICD-10-CM | POA: Diagnosis not present

## 2023-10-15 DIAGNOSIS — Z7982 Long term (current) use of aspirin: Secondary | ICD-10-CM | POA: Diagnosis not present

## 2023-10-15 DIAGNOSIS — L03115 Cellulitis of right lower limb: Secondary | ICD-10-CM | POA: Diagnosis not present

## 2023-10-15 DIAGNOSIS — M810 Age-related osteoporosis without current pathological fracture: Secondary | ICD-10-CM | POA: Diagnosis not present

## 2023-10-15 DIAGNOSIS — L97909 Non-pressure chronic ulcer of unspecified part of unspecified lower leg with unspecified severity: Secondary | ICD-10-CM | POA: Diagnosis not present

## 2023-10-15 LAB — COMPREHENSIVE METABOLIC PANEL
ALT: 14 IU/L (ref 0–32)
AST: 19 IU/L (ref 0–40)
Albumin: 3.6 g/dL (ref 3.6–4.6)
Alkaline Phosphatase: 82 IU/L (ref 44–121)
BUN/Creatinine Ratio: 17 (ref 12–28)
BUN: 41 mg/dL — ABNORMAL HIGH (ref 10–36)
Bilirubin Total: 0.4 mg/dL (ref 0.0–1.2)
CO2: 22 mmol/L (ref 20–29)
Calcium: 9.1 mg/dL (ref 8.7–10.3)
Chloride: 103 mmol/L (ref 96–106)
Creatinine, Ser: 2.35 mg/dL — ABNORMAL HIGH (ref 0.57–1.00)
Globulin, Total: 2.5 g/dL (ref 1.5–4.5)
Glucose: 92 mg/dL (ref 70–99)
Potassium: 3.9 mmol/L (ref 3.5–5.2)
Sodium: 141 mmol/L (ref 134–144)
Total Protein: 6.1 g/dL (ref 6.0–8.5)
eGFR: 19 mL/min/{1.73_m2} — ABNORMAL LOW (ref >59)

## 2023-10-15 LAB — LIPID PANEL
Chol/HDL Ratio: 2.2 ratio (ref 0.0–4.4)
Cholesterol, Total: 137 mg/dL (ref 100–199)
HDL: 63 mg/dL (ref >39)
LDL Chol Calc (NIH): 59 mg/dL (ref 0–99)
Triglycerides: 73 mg/dL (ref 0–149)
VLDL Cholesterol Cal: 15 mg/dL (ref 5–40)

## 2023-10-15 NOTE — Patient Instructions (Signed)
 Medication Instructions:   STOP AMLODIPINE  *If you need a refill on your cardiac medications before your next appointment, please call your pharmacy*   Follow-Up: At [facility] you and your health needs are our priority.  As part of our continuing mission to provide you with exceptional heart care, we have created designated Provider Care Teams.  These Care Teams include your primary Cardiologist (physician) and Advanced Practice Providers (APPs -  Physician Assistants and Nurse Practitioners) who all work together to provide you with the care you need, when you need it.    Your next appointment:   6 month(s)  Provider:   ANY APP

## 2023-10-18 ENCOUNTER — Telehealth: Payer: Self-pay | Admitting: Cardiology

## 2023-10-18 ENCOUNTER — Encounter: Payer: Self-pay | Admitting: Cardiology

## 2023-10-18 DIAGNOSIS — I10 Essential (primary) hypertension: Secondary | ICD-10-CM | POA: Diagnosis not present

## 2023-10-18 DIAGNOSIS — R1013 Epigastric pain: Secondary | ICD-10-CM | POA: Diagnosis not present

## 2023-10-18 DIAGNOSIS — R799 Abnormal finding of blood chemistry, unspecified: Secondary | ICD-10-CM

## 2023-10-18 DIAGNOSIS — R600 Localized edema: Secondary | ICD-10-CM

## 2023-10-18 DIAGNOSIS — R7989 Other specified abnormal findings of blood chemistry: Secondary | ICD-10-CM

## 2023-10-18 NOTE — Telephone Encounter (Signed)
 Patient identification verified by 2 forms. Shade Flood, RN     Call center agent forwarded call from patient's son Jomarie Longs. Jomarie Longs requested to speak to Colwyn, RN however she was unavailable at the time of the call.  This RN took the call, however once the agent went to connect the calls Jomarie Longs had disconnected from the other line.

## 2023-10-18 NOTE — Telephone Encounter (Signed)
 Attempted to call patient, no answer left message requesting a call back.

## 2023-10-18 NOTE — Telephone Encounter (Signed)
 Pt returning call, requesting cb

## 2023-10-18 NOTE — Telephone Encounter (Signed)
 Follow Up:       Patient's son is returning a call from today.

## 2023-10-19 ENCOUNTER — Encounter: Payer: Self-pay | Admitting: *Deleted

## 2023-10-19 NOTE — Telephone Encounter (Signed)
 Spoke with pt son, aware labs will be in 2 weeks from 10/15/23.

## 2023-10-19 NOTE — Addendum Note (Signed)
 Addended by: Jonah Blue on: 10/19/2023 02:41 PM   Modules accepted: Orders

## 2023-10-19 NOTE — Telephone Encounter (Signed)
 Attempted to call patient, no answer left message requesting a call back.

## 2023-10-19 NOTE — Telephone Encounter (Signed)
 Patient identification verified by 2 forms. Shade Flood, RN     Called and spoke to patient's son Jomarie Longs. Jomarie Longs made aware of the CMET lab results. Verbalized understanding.  Jomarie Longs states:  - would like to know what changes patient is to make between now and when she comes back for repeat labwork.  - patient was advised to stop amlodipine at her last OV but patient has not taken that medication in over a year.               Interventions/Plan: - Encounter forwarded to primary cardiologist for clarification.  - Order placed for BMET in 2wks    Reviewed ED warning signs/precautions  Patient agrees with plan, no questions at this time

## 2023-10-19 NOTE — Telephone Encounter (Signed)
 Son Jomarie Longs) returned RN Britany's call.

## 2023-10-20 DIAGNOSIS — I10 Essential (primary) hypertension: Secondary | ICD-10-CM | POA: Diagnosis not present

## 2023-10-22 DIAGNOSIS — I10 Essential (primary) hypertension: Secondary | ICD-10-CM | POA: Diagnosis not present

## 2023-10-25 ENCOUNTER — Other Ambulatory Visit: Payer: Self-pay | Admitting: *Deleted

## 2023-10-25 DIAGNOSIS — R799 Abnormal finding of blood chemistry, unspecified: Secondary | ICD-10-CM

## 2023-10-25 DIAGNOSIS — R7989 Other specified abnormal findings of blood chemistry: Secondary | ICD-10-CM

## 2023-10-25 DIAGNOSIS — R600 Localized edema: Secondary | ICD-10-CM

## 2023-10-25 DIAGNOSIS — I10 Essential (primary) hypertension: Secondary | ICD-10-CM | POA: Diagnosis not present

## 2023-10-27 DIAGNOSIS — I10 Essential (primary) hypertension: Secondary | ICD-10-CM | POA: Diagnosis not present

## 2023-10-29 DIAGNOSIS — I10 Essential (primary) hypertension: Secondary | ICD-10-CM | POA: Diagnosis not present

## 2023-11-01 DIAGNOSIS — I10 Essential (primary) hypertension: Secondary | ICD-10-CM | POA: Diagnosis not present

## 2023-11-02 DIAGNOSIS — H401133 Primary open-angle glaucoma, bilateral, severe stage: Secondary | ICD-10-CM | POA: Diagnosis not present

## 2023-11-03 DIAGNOSIS — I10 Essential (primary) hypertension: Secondary | ICD-10-CM | POA: Diagnosis not present

## 2023-11-04 ENCOUNTER — Encounter: Payer: Self-pay | Admitting: *Deleted

## 2023-11-05 DIAGNOSIS — I10 Essential (primary) hypertension: Secondary | ICD-10-CM | POA: Diagnosis not present

## 2023-11-08 DIAGNOSIS — I10 Essential (primary) hypertension: Secondary | ICD-10-CM | POA: Diagnosis not present

## 2023-11-10 DIAGNOSIS — I10 Essential (primary) hypertension: Secondary | ICD-10-CM | POA: Diagnosis not present

## 2023-11-12 DIAGNOSIS — Z87891 Personal history of nicotine dependence: Secondary | ICD-10-CM | POA: Diagnosis not present

## 2023-11-12 DIAGNOSIS — E785 Hyperlipidemia, unspecified: Secondary | ICD-10-CM | POA: Diagnosis not present

## 2023-11-12 DIAGNOSIS — L97909 Non-pressure chronic ulcer of unspecified part of unspecified lower leg with unspecified severity: Secondary | ICD-10-CM | POA: Diagnosis not present

## 2023-11-12 DIAGNOSIS — I129 Hypertensive chronic kidney disease with stage 1 through stage 4 chronic kidney disease, or unspecified chronic kidney disease: Secondary | ICD-10-CM | POA: Diagnosis not present

## 2023-11-12 DIAGNOSIS — I252 Old myocardial infarction: Secondary | ICD-10-CM | POA: Diagnosis not present

## 2023-11-12 DIAGNOSIS — Z556 Problems related to health literacy: Secondary | ICD-10-CM | POA: Diagnosis not present

## 2023-11-12 DIAGNOSIS — M810 Age-related osteoporosis without current pathological fracture: Secondary | ICD-10-CM | POA: Diagnosis not present

## 2023-11-12 DIAGNOSIS — L03115 Cellulitis of right lower limb: Secondary | ICD-10-CM | POA: Diagnosis not present

## 2023-11-12 DIAGNOSIS — Z7982 Long term (current) use of aspirin: Secondary | ICD-10-CM | POA: Diagnosis not present

## 2023-11-12 DIAGNOSIS — N184 Chronic kidney disease, stage 4 (severe): Secondary | ICD-10-CM | POA: Diagnosis not present

## 2023-11-15 DIAGNOSIS — Z87891 Personal history of nicotine dependence: Secondary | ICD-10-CM | POA: Diagnosis not present

## 2023-11-15 DIAGNOSIS — L03115 Cellulitis of right lower limb: Secondary | ICD-10-CM | POA: Diagnosis not present

## 2023-11-15 DIAGNOSIS — Z556 Problems related to health literacy: Secondary | ICD-10-CM | POA: Diagnosis not present

## 2023-11-15 DIAGNOSIS — I252 Old myocardial infarction: Secondary | ICD-10-CM | POA: Diagnosis not present

## 2023-11-15 DIAGNOSIS — I129 Hypertensive chronic kidney disease with stage 1 through stage 4 chronic kidney disease, or unspecified chronic kidney disease: Secondary | ICD-10-CM | POA: Diagnosis not present

## 2023-11-15 DIAGNOSIS — Z7982 Long term (current) use of aspirin: Secondary | ICD-10-CM | POA: Diagnosis not present

## 2023-11-15 DIAGNOSIS — M810 Age-related osteoporosis without current pathological fracture: Secondary | ICD-10-CM | POA: Diagnosis not present

## 2023-11-15 DIAGNOSIS — E785 Hyperlipidemia, unspecified: Secondary | ICD-10-CM | POA: Diagnosis not present

## 2023-11-15 DIAGNOSIS — L97909 Non-pressure chronic ulcer of unspecified part of unspecified lower leg with unspecified severity: Secondary | ICD-10-CM | POA: Diagnosis not present

## 2023-11-15 DIAGNOSIS — N184 Chronic kidney disease, stage 4 (severe): Secondary | ICD-10-CM | POA: Diagnosis not present

## 2023-11-16 DIAGNOSIS — R7989 Other specified abnormal findings of blood chemistry: Secondary | ICD-10-CM | POA: Diagnosis not present

## 2023-11-16 DIAGNOSIS — R600 Localized edema: Secondary | ICD-10-CM | POA: Diagnosis not present

## 2023-11-16 DIAGNOSIS — R799 Abnormal finding of blood chemistry, unspecified: Secondary | ICD-10-CM | POA: Diagnosis not present

## 2023-11-17 ENCOUNTER — Telehealth: Payer: Self-pay | Admitting: *Deleted

## 2023-11-17 ENCOUNTER — Encounter: Payer: Self-pay | Admitting: *Deleted

## 2023-11-17 DIAGNOSIS — I252 Old myocardial infarction: Secondary | ICD-10-CM | POA: Diagnosis not present

## 2023-11-17 DIAGNOSIS — R600 Localized edema: Secondary | ICD-10-CM

## 2023-11-17 DIAGNOSIS — Z556 Problems related to health literacy: Secondary | ICD-10-CM | POA: Diagnosis not present

## 2023-11-17 DIAGNOSIS — L03115 Cellulitis of right lower limb: Secondary | ICD-10-CM | POA: Diagnosis not present

## 2023-11-17 DIAGNOSIS — E785 Hyperlipidemia, unspecified: Secondary | ICD-10-CM | POA: Diagnosis not present

## 2023-11-17 DIAGNOSIS — Z7982 Long term (current) use of aspirin: Secondary | ICD-10-CM | POA: Diagnosis not present

## 2023-11-17 DIAGNOSIS — Z87891 Personal history of nicotine dependence: Secondary | ICD-10-CM | POA: Diagnosis not present

## 2023-11-17 DIAGNOSIS — L97909 Non-pressure chronic ulcer of unspecified part of unspecified lower leg with unspecified severity: Secondary | ICD-10-CM | POA: Diagnosis not present

## 2023-11-17 DIAGNOSIS — N1832 Chronic kidney disease, stage 3b: Secondary | ICD-10-CM

## 2023-11-17 DIAGNOSIS — I129 Hypertensive chronic kidney disease with stage 1 through stage 4 chronic kidney disease, or unspecified chronic kidney disease: Secondary | ICD-10-CM | POA: Diagnosis not present

## 2023-11-17 DIAGNOSIS — N184 Chronic kidney disease, stage 4 (severe): Secondary | ICD-10-CM | POA: Diagnosis not present

## 2023-11-17 DIAGNOSIS — M810 Age-related osteoporosis without current pathological fracture: Secondary | ICD-10-CM | POA: Diagnosis not present

## 2023-11-17 LAB — BASIC METABOLIC PANEL WITH GFR
BUN/Creatinine Ratio: 16 (ref 12–28)
BUN: 37 mg/dL — ABNORMAL HIGH (ref 10–36)
CO2: 24 mmol/L (ref 20–29)
Calcium: 9.2 mg/dL (ref 8.7–10.3)
Chloride: 105 mmol/L (ref 96–106)
Creatinine, Ser: 2.36 mg/dL — ABNORMAL HIGH (ref 0.57–1.00)
Glucose: 93 mg/dL (ref 70–99)
Potassium: 4.6 mmol/L (ref 3.5–5.2)
Sodium: 141 mmol/L (ref 134–144)
eGFR: 19 mL/min/{1.73_m2} — ABNORMAL LOW (ref >59)

## 2023-11-17 MED ORDER — FUROSEMIDE 20 MG PO TABS: 20.0000 mg | ORAL_TABLET | Freq: Two times a day (BID) | ORAL | Status: DC

## 2023-11-17 NOTE — Telephone Encounter (Signed)
-----   Message from Alexandria Angel sent at 11/17/2023  6:38 AM EDT ----- Decrease lasix  to 20 mg BID; bmet 2 weeks Alexandria Angel

## 2023-11-17 NOTE — Telephone Encounter (Signed)
 Left detailed message for patient of results and crenshaw recommendations. Message aslo sent to patient via my chart. Lab orders mailed to the pt.

## 2023-11-19 DIAGNOSIS — M810 Age-related osteoporosis without current pathological fracture: Secondary | ICD-10-CM | POA: Diagnosis not present

## 2023-11-19 DIAGNOSIS — L97909 Non-pressure chronic ulcer of unspecified part of unspecified lower leg with unspecified severity: Secondary | ICD-10-CM | POA: Diagnosis not present

## 2023-11-19 DIAGNOSIS — N184 Chronic kidney disease, stage 4 (severe): Secondary | ICD-10-CM | POA: Diagnosis not present

## 2023-11-19 DIAGNOSIS — Z87891 Personal history of nicotine dependence: Secondary | ICD-10-CM | POA: Diagnosis not present

## 2023-11-19 DIAGNOSIS — E785 Hyperlipidemia, unspecified: Secondary | ICD-10-CM | POA: Diagnosis not present

## 2023-11-19 DIAGNOSIS — I129 Hypertensive chronic kidney disease with stage 1 through stage 4 chronic kidney disease, or unspecified chronic kidney disease: Secondary | ICD-10-CM | POA: Diagnosis not present

## 2023-11-19 DIAGNOSIS — Z7982 Long term (current) use of aspirin: Secondary | ICD-10-CM | POA: Diagnosis not present

## 2023-11-19 DIAGNOSIS — L03115 Cellulitis of right lower limb: Secondary | ICD-10-CM | POA: Diagnosis not present

## 2023-11-19 DIAGNOSIS — I252 Old myocardial infarction: Secondary | ICD-10-CM | POA: Diagnosis not present

## 2023-11-19 DIAGNOSIS — Z556 Problems related to health literacy: Secondary | ICD-10-CM | POA: Diagnosis not present

## 2023-11-22 DIAGNOSIS — Z7982 Long term (current) use of aspirin: Secondary | ICD-10-CM | POA: Diagnosis not present

## 2023-11-22 DIAGNOSIS — L97909 Non-pressure chronic ulcer of unspecified part of unspecified lower leg with unspecified severity: Secondary | ICD-10-CM | POA: Diagnosis not present

## 2023-11-22 DIAGNOSIS — I129 Hypertensive chronic kidney disease with stage 1 through stage 4 chronic kidney disease, or unspecified chronic kidney disease: Secondary | ICD-10-CM | POA: Diagnosis not present

## 2023-11-22 DIAGNOSIS — I252 Old myocardial infarction: Secondary | ICD-10-CM | POA: Diagnosis not present

## 2023-11-22 DIAGNOSIS — Z87891 Personal history of nicotine dependence: Secondary | ICD-10-CM | POA: Diagnosis not present

## 2023-11-22 DIAGNOSIS — M810 Age-related osteoporosis without current pathological fracture: Secondary | ICD-10-CM | POA: Diagnosis not present

## 2023-11-22 DIAGNOSIS — N184 Chronic kidney disease, stage 4 (severe): Secondary | ICD-10-CM | POA: Diagnosis not present

## 2023-11-22 DIAGNOSIS — E785 Hyperlipidemia, unspecified: Secondary | ICD-10-CM | POA: Diagnosis not present

## 2023-11-22 DIAGNOSIS — Z556 Problems related to health literacy: Secondary | ICD-10-CM | POA: Diagnosis not present

## 2023-11-22 DIAGNOSIS — L03115 Cellulitis of right lower limb: Secondary | ICD-10-CM | POA: Diagnosis not present

## 2023-11-24 DIAGNOSIS — Z7982 Long term (current) use of aspirin: Secondary | ICD-10-CM | POA: Diagnosis not present

## 2023-11-24 DIAGNOSIS — M810 Age-related osteoporosis without current pathological fracture: Secondary | ICD-10-CM | POA: Diagnosis not present

## 2023-11-24 DIAGNOSIS — I129 Hypertensive chronic kidney disease with stage 1 through stage 4 chronic kidney disease, or unspecified chronic kidney disease: Secondary | ICD-10-CM | POA: Diagnosis not present

## 2023-11-24 DIAGNOSIS — E785 Hyperlipidemia, unspecified: Secondary | ICD-10-CM | POA: Diagnosis not present

## 2023-11-24 DIAGNOSIS — Z87891 Personal history of nicotine dependence: Secondary | ICD-10-CM | POA: Diagnosis not present

## 2023-11-24 DIAGNOSIS — I252 Old myocardial infarction: Secondary | ICD-10-CM | POA: Diagnosis not present

## 2023-11-24 DIAGNOSIS — N184 Chronic kidney disease, stage 4 (severe): Secondary | ICD-10-CM | POA: Diagnosis not present

## 2023-11-24 DIAGNOSIS — L03115 Cellulitis of right lower limb: Secondary | ICD-10-CM | POA: Diagnosis not present

## 2023-11-24 DIAGNOSIS — Z556 Problems related to health literacy: Secondary | ICD-10-CM | POA: Diagnosis not present

## 2023-11-24 DIAGNOSIS — L97909 Non-pressure chronic ulcer of unspecified part of unspecified lower leg with unspecified severity: Secondary | ICD-10-CM | POA: Diagnosis not present

## 2023-11-26 DIAGNOSIS — Z7982 Long term (current) use of aspirin: Secondary | ICD-10-CM | POA: Diagnosis not present

## 2023-11-26 DIAGNOSIS — Z87891 Personal history of nicotine dependence: Secondary | ICD-10-CM | POA: Diagnosis not present

## 2023-11-26 DIAGNOSIS — I252 Old myocardial infarction: Secondary | ICD-10-CM | POA: Diagnosis not present

## 2023-11-26 DIAGNOSIS — L03115 Cellulitis of right lower limb: Secondary | ICD-10-CM | POA: Diagnosis not present

## 2023-11-26 DIAGNOSIS — N184 Chronic kidney disease, stage 4 (severe): Secondary | ICD-10-CM | POA: Diagnosis not present

## 2023-11-26 DIAGNOSIS — L97909 Non-pressure chronic ulcer of unspecified part of unspecified lower leg with unspecified severity: Secondary | ICD-10-CM | POA: Diagnosis not present

## 2023-11-26 DIAGNOSIS — I129 Hypertensive chronic kidney disease with stage 1 through stage 4 chronic kidney disease, or unspecified chronic kidney disease: Secondary | ICD-10-CM | POA: Diagnosis not present

## 2023-11-26 DIAGNOSIS — Z556 Problems related to health literacy: Secondary | ICD-10-CM | POA: Diagnosis not present

## 2023-11-26 DIAGNOSIS — M810 Age-related osteoporosis without current pathological fracture: Secondary | ICD-10-CM | POA: Diagnosis not present

## 2023-11-26 DIAGNOSIS — E785 Hyperlipidemia, unspecified: Secondary | ICD-10-CM | POA: Diagnosis not present

## 2023-11-29 DIAGNOSIS — E785 Hyperlipidemia, unspecified: Secondary | ICD-10-CM | POA: Diagnosis not present

## 2023-11-29 DIAGNOSIS — L03115 Cellulitis of right lower limb: Secondary | ICD-10-CM | POA: Diagnosis not present

## 2023-11-29 DIAGNOSIS — I129 Hypertensive chronic kidney disease with stage 1 through stage 4 chronic kidney disease, or unspecified chronic kidney disease: Secondary | ICD-10-CM | POA: Diagnosis not present

## 2023-11-29 DIAGNOSIS — Z7982 Long term (current) use of aspirin: Secondary | ICD-10-CM | POA: Diagnosis not present

## 2023-11-29 DIAGNOSIS — N184 Chronic kidney disease, stage 4 (severe): Secondary | ICD-10-CM | POA: Diagnosis not present

## 2023-11-29 DIAGNOSIS — I252 Old myocardial infarction: Secondary | ICD-10-CM | POA: Diagnosis not present

## 2023-11-29 DIAGNOSIS — Z87891 Personal history of nicotine dependence: Secondary | ICD-10-CM | POA: Diagnosis not present

## 2023-11-29 DIAGNOSIS — Z556 Problems related to health literacy: Secondary | ICD-10-CM | POA: Diagnosis not present

## 2023-11-29 DIAGNOSIS — L97909 Non-pressure chronic ulcer of unspecified part of unspecified lower leg with unspecified severity: Secondary | ICD-10-CM | POA: Diagnosis not present

## 2023-11-29 DIAGNOSIS — M810 Age-related osteoporosis without current pathological fracture: Secondary | ICD-10-CM | POA: Diagnosis not present

## 2023-12-01 DIAGNOSIS — I129 Hypertensive chronic kidney disease with stage 1 through stage 4 chronic kidney disease, or unspecified chronic kidney disease: Secondary | ICD-10-CM | POA: Diagnosis not present

## 2023-12-01 DIAGNOSIS — I252 Old myocardial infarction: Secondary | ICD-10-CM | POA: Diagnosis not present

## 2023-12-01 DIAGNOSIS — Z87891 Personal history of nicotine dependence: Secondary | ICD-10-CM | POA: Diagnosis not present

## 2023-12-01 DIAGNOSIS — L03115 Cellulitis of right lower limb: Secondary | ICD-10-CM | POA: Diagnosis not present

## 2023-12-01 DIAGNOSIS — M810 Age-related osteoporosis without current pathological fracture: Secondary | ICD-10-CM | POA: Diagnosis not present

## 2023-12-01 DIAGNOSIS — L97909 Non-pressure chronic ulcer of unspecified part of unspecified lower leg with unspecified severity: Secondary | ICD-10-CM | POA: Diagnosis not present

## 2023-12-01 DIAGNOSIS — E785 Hyperlipidemia, unspecified: Secondary | ICD-10-CM | POA: Diagnosis not present

## 2023-12-01 DIAGNOSIS — Z556 Problems related to health literacy: Secondary | ICD-10-CM | POA: Diagnosis not present

## 2023-12-01 DIAGNOSIS — N184 Chronic kidney disease, stage 4 (severe): Secondary | ICD-10-CM | POA: Diagnosis not present

## 2023-12-01 DIAGNOSIS — Z7982 Long term (current) use of aspirin: Secondary | ICD-10-CM | POA: Diagnosis not present

## 2023-12-02 ENCOUNTER — Ambulatory Visit (HOSPITAL_BASED_OUTPATIENT_CLINIC_OR_DEPARTMENT_OTHER): Admitting: Internal Medicine

## 2023-12-03 DIAGNOSIS — M810 Age-related osteoporosis without current pathological fracture: Secondary | ICD-10-CM | POA: Diagnosis not present

## 2023-12-03 DIAGNOSIS — Z556 Problems related to health literacy: Secondary | ICD-10-CM | POA: Diagnosis not present

## 2023-12-03 DIAGNOSIS — L03115 Cellulitis of right lower limb: Secondary | ICD-10-CM | POA: Diagnosis not present

## 2023-12-03 DIAGNOSIS — E785 Hyperlipidemia, unspecified: Secondary | ICD-10-CM | POA: Diagnosis not present

## 2023-12-03 DIAGNOSIS — L97909 Non-pressure chronic ulcer of unspecified part of unspecified lower leg with unspecified severity: Secondary | ICD-10-CM | POA: Diagnosis not present

## 2023-12-03 DIAGNOSIS — Z87891 Personal history of nicotine dependence: Secondary | ICD-10-CM | POA: Diagnosis not present

## 2023-12-03 DIAGNOSIS — I252 Old myocardial infarction: Secondary | ICD-10-CM | POA: Diagnosis not present

## 2023-12-03 DIAGNOSIS — I129 Hypertensive chronic kidney disease with stage 1 through stage 4 chronic kidney disease, or unspecified chronic kidney disease: Secondary | ICD-10-CM | POA: Diagnosis not present

## 2023-12-03 DIAGNOSIS — Z7982 Long term (current) use of aspirin: Secondary | ICD-10-CM | POA: Diagnosis not present

## 2023-12-03 DIAGNOSIS — N184 Chronic kidney disease, stage 4 (severe): Secondary | ICD-10-CM | POA: Diagnosis not present

## 2023-12-06 DIAGNOSIS — N184 Chronic kidney disease, stage 4 (severe): Secondary | ICD-10-CM | POA: Diagnosis not present

## 2023-12-06 DIAGNOSIS — L03115 Cellulitis of right lower limb: Secondary | ICD-10-CM | POA: Diagnosis not present

## 2023-12-06 DIAGNOSIS — Z87891 Personal history of nicotine dependence: Secondary | ICD-10-CM | POA: Diagnosis not present

## 2023-12-06 DIAGNOSIS — E785 Hyperlipidemia, unspecified: Secondary | ICD-10-CM | POA: Diagnosis not present

## 2023-12-06 DIAGNOSIS — I252 Old myocardial infarction: Secondary | ICD-10-CM | POA: Diagnosis not present

## 2023-12-06 DIAGNOSIS — I129 Hypertensive chronic kidney disease with stage 1 through stage 4 chronic kidney disease, or unspecified chronic kidney disease: Secondary | ICD-10-CM | POA: Diagnosis not present

## 2023-12-06 DIAGNOSIS — L97909 Non-pressure chronic ulcer of unspecified part of unspecified lower leg with unspecified severity: Secondary | ICD-10-CM | POA: Diagnosis not present

## 2023-12-06 DIAGNOSIS — Z556 Problems related to health literacy: Secondary | ICD-10-CM | POA: Diagnosis not present

## 2023-12-06 DIAGNOSIS — M810 Age-related osteoporosis without current pathological fracture: Secondary | ICD-10-CM | POA: Diagnosis not present

## 2023-12-06 DIAGNOSIS — Z7982 Long term (current) use of aspirin: Secondary | ICD-10-CM | POA: Diagnosis not present

## 2023-12-08 DIAGNOSIS — L03115 Cellulitis of right lower limb: Secondary | ICD-10-CM | POA: Diagnosis not present

## 2023-12-08 DIAGNOSIS — N184 Chronic kidney disease, stage 4 (severe): Secondary | ICD-10-CM | POA: Diagnosis not present

## 2023-12-08 DIAGNOSIS — Z7982 Long term (current) use of aspirin: Secondary | ICD-10-CM | POA: Diagnosis not present

## 2023-12-08 DIAGNOSIS — L97909 Non-pressure chronic ulcer of unspecified part of unspecified lower leg with unspecified severity: Secondary | ICD-10-CM | POA: Diagnosis not present

## 2023-12-08 DIAGNOSIS — E785 Hyperlipidemia, unspecified: Secondary | ICD-10-CM | POA: Diagnosis not present

## 2023-12-08 DIAGNOSIS — Z556 Problems related to health literacy: Secondary | ICD-10-CM | POA: Diagnosis not present

## 2023-12-08 DIAGNOSIS — I129 Hypertensive chronic kidney disease with stage 1 through stage 4 chronic kidney disease, or unspecified chronic kidney disease: Secondary | ICD-10-CM | POA: Diagnosis not present

## 2023-12-08 DIAGNOSIS — Z87891 Personal history of nicotine dependence: Secondary | ICD-10-CM | POA: Diagnosis not present

## 2023-12-08 DIAGNOSIS — I252 Old myocardial infarction: Secondary | ICD-10-CM | POA: Diagnosis not present

## 2023-12-08 DIAGNOSIS — M810 Age-related osteoporosis without current pathological fracture: Secondary | ICD-10-CM | POA: Diagnosis not present

## 2023-12-10 DIAGNOSIS — E785 Hyperlipidemia, unspecified: Secondary | ICD-10-CM | POA: Diagnosis not present

## 2023-12-10 DIAGNOSIS — N184 Chronic kidney disease, stage 4 (severe): Secondary | ICD-10-CM | POA: Diagnosis not present

## 2023-12-10 DIAGNOSIS — Z7982 Long term (current) use of aspirin: Secondary | ICD-10-CM | POA: Diagnosis not present

## 2023-12-10 DIAGNOSIS — Z87891 Personal history of nicotine dependence: Secondary | ICD-10-CM | POA: Diagnosis not present

## 2023-12-10 DIAGNOSIS — L03115 Cellulitis of right lower limb: Secondary | ICD-10-CM | POA: Diagnosis not present

## 2023-12-10 DIAGNOSIS — I129 Hypertensive chronic kidney disease with stage 1 through stage 4 chronic kidney disease, or unspecified chronic kidney disease: Secondary | ICD-10-CM | POA: Diagnosis not present

## 2023-12-10 DIAGNOSIS — I252 Old myocardial infarction: Secondary | ICD-10-CM | POA: Diagnosis not present

## 2023-12-10 DIAGNOSIS — Z556 Problems related to health literacy: Secondary | ICD-10-CM | POA: Diagnosis not present

## 2023-12-10 DIAGNOSIS — M810 Age-related osteoporosis without current pathological fracture: Secondary | ICD-10-CM | POA: Diagnosis not present

## 2023-12-10 DIAGNOSIS — L97909 Non-pressure chronic ulcer of unspecified part of unspecified lower leg with unspecified severity: Secondary | ICD-10-CM | POA: Diagnosis not present

## 2023-12-13 DIAGNOSIS — Z556 Problems related to health literacy: Secondary | ICD-10-CM | POA: Diagnosis not present

## 2023-12-13 DIAGNOSIS — I252 Old myocardial infarction: Secondary | ICD-10-CM | POA: Diagnosis not present

## 2023-12-13 DIAGNOSIS — L03115 Cellulitis of right lower limb: Secondary | ICD-10-CM | POA: Diagnosis not present

## 2023-12-13 DIAGNOSIS — Z7982 Long term (current) use of aspirin: Secondary | ICD-10-CM | POA: Diagnosis not present

## 2023-12-13 DIAGNOSIS — M810 Age-related osteoporosis without current pathological fracture: Secondary | ICD-10-CM | POA: Diagnosis not present

## 2023-12-13 DIAGNOSIS — I129 Hypertensive chronic kidney disease with stage 1 through stage 4 chronic kidney disease, or unspecified chronic kidney disease: Secondary | ICD-10-CM | POA: Diagnosis not present

## 2023-12-13 DIAGNOSIS — L97909 Non-pressure chronic ulcer of unspecified part of unspecified lower leg with unspecified severity: Secondary | ICD-10-CM | POA: Diagnosis not present

## 2023-12-13 DIAGNOSIS — Z87891 Personal history of nicotine dependence: Secondary | ICD-10-CM | POA: Diagnosis not present

## 2023-12-13 DIAGNOSIS — E785 Hyperlipidemia, unspecified: Secondary | ICD-10-CM | POA: Diagnosis not present

## 2023-12-13 DIAGNOSIS — N184 Chronic kidney disease, stage 4 (severe): Secondary | ICD-10-CM | POA: Diagnosis not present

## 2023-12-14 DIAGNOSIS — E785 Hyperlipidemia, unspecified: Secondary | ICD-10-CM | POA: Diagnosis not present

## 2023-12-14 DIAGNOSIS — Z556 Problems related to health literacy: Secondary | ICD-10-CM | POA: Diagnosis not present

## 2023-12-14 DIAGNOSIS — I2510 Atherosclerotic heart disease of native coronary artery without angina pectoris: Secondary | ICD-10-CM | POA: Diagnosis not present

## 2023-12-14 DIAGNOSIS — I252 Old myocardial infarction: Secondary | ICD-10-CM | POA: Diagnosis not present

## 2023-12-14 DIAGNOSIS — I129 Hypertensive chronic kidney disease with stage 1 through stage 4 chronic kidney disease, or unspecified chronic kidney disease: Secondary | ICD-10-CM | POA: Diagnosis not present

## 2023-12-14 DIAGNOSIS — I872 Venous insufficiency (chronic) (peripheral): Secondary | ICD-10-CM | POA: Diagnosis not present

## 2023-12-14 DIAGNOSIS — Z87891 Personal history of nicotine dependence: Secondary | ICD-10-CM | POA: Diagnosis not present

## 2023-12-14 DIAGNOSIS — L97818 Non-pressure chronic ulcer of other part of right lower leg with other specified severity: Secondary | ICD-10-CM | POA: Diagnosis not present

## 2023-12-14 DIAGNOSIS — L03115 Cellulitis of right lower limb: Secondary | ICD-10-CM | POA: Diagnosis not present

## 2023-12-14 DIAGNOSIS — M810 Age-related osteoporosis without current pathological fracture: Secondary | ICD-10-CM | POA: Diagnosis not present

## 2023-12-14 DIAGNOSIS — N184 Chronic kidney disease, stage 4 (severe): Secondary | ICD-10-CM | POA: Diagnosis not present

## 2023-12-14 DIAGNOSIS — Z7982 Long term (current) use of aspirin: Secondary | ICD-10-CM | POA: Diagnosis not present

## 2023-12-15 DIAGNOSIS — L03115 Cellulitis of right lower limb: Secondary | ICD-10-CM | POA: Diagnosis not present

## 2023-12-15 DIAGNOSIS — Z87891 Personal history of nicotine dependence: Secondary | ICD-10-CM | POA: Diagnosis not present

## 2023-12-15 DIAGNOSIS — I872 Venous insufficiency (chronic) (peripheral): Secondary | ICD-10-CM | POA: Diagnosis not present

## 2023-12-15 DIAGNOSIS — Z556 Problems related to health literacy: Secondary | ICD-10-CM | POA: Diagnosis not present

## 2023-12-15 DIAGNOSIS — I2510 Atherosclerotic heart disease of native coronary artery without angina pectoris: Secondary | ICD-10-CM | POA: Diagnosis not present

## 2023-12-15 DIAGNOSIS — Z7982 Long term (current) use of aspirin: Secondary | ICD-10-CM | POA: Diagnosis not present

## 2023-12-15 DIAGNOSIS — L97818 Non-pressure chronic ulcer of other part of right lower leg with other specified severity: Secondary | ICD-10-CM | POA: Diagnosis not present

## 2023-12-15 DIAGNOSIS — E785 Hyperlipidemia, unspecified: Secondary | ICD-10-CM | POA: Diagnosis not present

## 2023-12-15 DIAGNOSIS — I252 Old myocardial infarction: Secondary | ICD-10-CM | POA: Diagnosis not present

## 2023-12-15 DIAGNOSIS — M810 Age-related osteoporosis without current pathological fracture: Secondary | ICD-10-CM | POA: Diagnosis not present

## 2023-12-15 DIAGNOSIS — I129 Hypertensive chronic kidney disease with stage 1 through stage 4 chronic kidney disease, or unspecified chronic kidney disease: Secondary | ICD-10-CM | POA: Diagnosis not present

## 2023-12-15 DIAGNOSIS — N184 Chronic kidney disease, stage 4 (severe): Secondary | ICD-10-CM | POA: Diagnosis not present

## 2023-12-17 DIAGNOSIS — Z7982 Long term (current) use of aspirin: Secondary | ICD-10-CM | POA: Diagnosis not present

## 2023-12-17 DIAGNOSIS — N390 Urinary tract infection, site not specified: Secondary | ICD-10-CM | POA: Diagnosis not present

## 2023-12-17 DIAGNOSIS — N184 Chronic kidney disease, stage 4 (severe): Secondary | ICD-10-CM | POA: Diagnosis not present

## 2023-12-17 DIAGNOSIS — M810 Age-related osteoporosis without current pathological fracture: Secondary | ICD-10-CM | POA: Diagnosis not present

## 2023-12-17 DIAGNOSIS — R600 Localized edema: Secondary | ICD-10-CM | POA: Diagnosis not present

## 2023-12-17 DIAGNOSIS — E785 Hyperlipidemia, unspecified: Secondary | ICD-10-CM | POA: Diagnosis not present

## 2023-12-17 DIAGNOSIS — I129 Hypertensive chronic kidney disease with stage 1 through stage 4 chronic kidney disease, or unspecified chronic kidney disease: Secondary | ICD-10-CM | POA: Diagnosis not present

## 2023-12-17 DIAGNOSIS — I2510 Atherosclerotic heart disease of native coronary artery without angina pectoris: Secondary | ICD-10-CM | POA: Diagnosis not present

## 2023-12-17 DIAGNOSIS — I252 Old myocardial infarction: Secondary | ICD-10-CM | POA: Diagnosis not present

## 2023-12-17 DIAGNOSIS — Z87891 Personal history of nicotine dependence: Secondary | ICD-10-CM | POA: Diagnosis not present

## 2023-12-17 DIAGNOSIS — L03115 Cellulitis of right lower limb: Secondary | ICD-10-CM | POA: Diagnosis not present

## 2023-12-17 DIAGNOSIS — L97818 Non-pressure chronic ulcer of other part of right lower leg with other specified severity: Secondary | ICD-10-CM | POA: Diagnosis not present

## 2023-12-17 DIAGNOSIS — N1832 Chronic kidney disease, stage 3b: Secondary | ICD-10-CM | POA: Diagnosis not present

## 2023-12-17 DIAGNOSIS — Z556 Problems related to health literacy: Secondary | ICD-10-CM | POA: Diagnosis not present

## 2023-12-17 DIAGNOSIS — I872 Venous insufficiency (chronic) (peripheral): Secondary | ICD-10-CM | POA: Diagnosis not present

## 2023-12-18 ENCOUNTER — Ambulatory Visit: Payer: Self-pay | Admitting: Cardiology

## 2023-12-18 LAB — BASIC METABOLIC PANEL WITH GFR
BUN/Creatinine Ratio: 27 (ref 12–28)
BUN: 52 mg/dL — ABNORMAL HIGH (ref 10–36)
CO2: 18 mmol/L — ABNORMAL LOW (ref 20–29)
Calcium: 9.3 mg/dL (ref 8.7–10.3)
Chloride: 105 mmol/L (ref 96–106)
Creatinine, Ser: 1.92 mg/dL — ABNORMAL HIGH (ref 0.57–1.00)
Glucose: 87 mg/dL (ref 70–99)
Potassium: 4.7 mmol/L (ref 3.5–5.2)
Sodium: 142 mmol/L (ref 134–144)
eGFR: 24 mL/min/{1.73_m2} — ABNORMAL LOW (ref >59)

## 2023-12-20 DIAGNOSIS — M810 Age-related osteoporosis without current pathological fracture: Secondary | ICD-10-CM | POA: Diagnosis not present

## 2023-12-20 DIAGNOSIS — Z7982 Long term (current) use of aspirin: Secondary | ICD-10-CM | POA: Diagnosis not present

## 2023-12-20 DIAGNOSIS — I252 Old myocardial infarction: Secondary | ICD-10-CM | POA: Diagnosis not present

## 2023-12-20 DIAGNOSIS — I129 Hypertensive chronic kidney disease with stage 1 through stage 4 chronic kidney disease, or unspecified chronic kidney disease: Secondary | ICD-10-CM | POA: Diagnosis not present

## 2023-12-20 DIAGNOSIS — L03115 Cellulitis of right lower limb: Secondary | ICD-10-CM | POA: Diagnosis not present

## 2023-12-20 DIAGNOSIS — I2510 Atherosclerotic heart disease of native coronary artery without angina pectoris: Secondary | ICD-10-CM | POA: Diagnosis not present

## 2023-12-20 DIAGNOSIS — Z556 Problems related to health literacy: Secondary | ICD-10-CM | POA: Diagnosis not present

## 2023-12-20 DIAGNOSIS — E785 Hyperlipidemia, unspecified: Secondary | ICD-10-CM | POA: Diagnosis not present

## 2023-12-20 DIAGNOSIS — Z87891 Personal history of nicotine dependence: Secondary | ICD-10-CM | POA: Diagnosis not present

## 2023-12-20 DIAGNOSIS — L97818 Non-pressure chronic ulcer of other part of right lower leg with other specified severity: Secondary | ICD-10-CM | POA: Diagnosis not present

## 2023-12-20 DIAGNOSIS — N184 Chronic kidney disease, stage 4 (severe): Secondary | ICD-10-CM | POA: Diagnosis not present

## 2023-12-20 DIAGNOSIS — I872 Venous insufficiency (chronic) (peripheral): Secondary | ICD-10-CM | POA: Diagnosis not present

## 2023-12-22 DIAGNOSIS — Z556 Problems related to health literacy: Secondary | ICD-10-CM | POA: Diagnosis not present

## 2023-12-22 DIAGNOSIS — L97818 Non-pressure chronic ulcer of other part of right lower leg with other specified severity: Secondary | ICD-10-CM | POA: Diagnosis not present

## 2023-12-22 DIAGNOSIS — I872 Venous insufficiency (chronic) (peripheral): Secondary | ICD-10-CM | POA: Diagnosis not present

## 2023-12-22 DIAGNOSIS — L03115 Cellulitis of right lower limb: Secondary | ICD-10-CM | POA: Diagnosis not present

## 2023-12-22 DIAGNOSIS — E785 Hyperlipidemia, unspecified: Secondary | ICD-10-CM | POA: Diagnosis not present

## 2023-12-22 DIAGNOSIS — N184 Chronic kidney disease, stage 4 (severe): Secondary | ICD-10-CM | POA: Diagnosis not present

## 2023-12-22 DIAGNOSIS — M810 Age-related osteoporosis without current pathological fracture: Secondary | ICD-10-CM | POA: Diagnosis not present

## 2023-12-22 DIAGNOSIS — I129 Hypertensive chronic kidney disease with stage 1 through stage 4 chronic kidney disease, or unspecified chronic kidney disease: Secondary | ICD-10-CM | POA: Diagnosis not present

## 2023-12-22 DIAGNOSIS — Z7982 Long term (current) use of aspirin: Secondary | ICD-10-CM | POA: Diagnosis not present

## 2023-12-22 DIAGNOSIS — I2510 Atherosclerotic heart disease of native coronary artery without angina pectoris: Secondary | ICD-10-CM | POA: Diagnosis not present

## 2023-12-22 DIAGNOSIS — Z87891 Personal history of nicotine dependence: Secondary | ICD-10-CM | POA: Diagnosis not present

## 2023-12-22 DIAGNOSIS — I252 Old myocardial infarction: Secondary | ICD-10-CM | POA: Diagnosis not present

## 2023-12-22 MED ORDER — FUROSEMIDE 20 MG PO TABS: 20.0000 mg | ORAL_TABLET | Freq: Every day | ORAL | Status: AC

## 2023-12-22 NOTE — Telephone Encounter (Signed)
 Spoke with pt son, he reports that she is now down to 20 mg of furosemide  once daily. Copy faxed to the medical doctor at his request. He reports the namenda she is taking will also effect the kidneys and he was asking about dose of that medication. He was referred back to the medical doctor for advise. He know to call if she has problems with swelling on the decreased dosage.   Decrease lasix  to 20 mg BID; bmet 2 weeks  Alexandria Angel

## 2023-12-22 NOTE — Telephone Encounter (Signed)
 Pt's son returning call, requesting cb to [phone]

## 2023-12-24 DIAGNOSIS — M810 Age-related osteoporosis without current pathological fracture: Secondary | ICD-10-CM | POA: Diagnosis not present

## 2023-12-24 DIAGNOSIS — Z7982 Long term (current) use of aspirin: Secondary | ICD-10-CM | POA: Diagnosis not present

## 2023-12-24 DIAGNOSIS — L03115 Cellulitis of right lower limb: Secondary | ICD-10-CM | POA: Diagnosis not present

## 2023-12-24 DIAGNOSIS — Z87891 Personal history of nicotine dependence: Secondary | ICD-10-CM | POA: Diagnosis not present

## 2023-12-24 DIAGNOSIS — I2510 Atherosclerotic heart disease of native coronary artery without angina pectoris: Secondary | ICD-10-CM | POA: Diagnosis not present

## 2023-12-24 DIAGNOSIS — N184 Chronic kidney disease, stage 4 (severe): Secondary | ICD-10-CM | POA: Diagnosis not present

## 2023-12-24 DIAGNOSIS — L97818 Non-pressure chronic ulcer of other part of right lower leg with other specified severity: Secondary | ICD-10-CM | POA: Diagnosis not present

## 2023-12-24 DIAGNOSIS — I252 Old myocardial infarction: Secondary | ICD-10-CM | POA: Diagnosis not present

## 2023-12-24 DIAGNOSIS — I129 Hypertensive chronic kidney disease with stage 1 through stage 4 chronic kidney disease, or unspecified chronic kidney disease: Secondary | ICD-10-CM | POA: Diagnosis not present

## 2023-12-24 DIAGNOSIS — I872 Venous insufficiency (chronic) (peripheral): Secondary | ICD-10-CM | POA: Diagnosis not present

## 2023-12-24 DIAGNOSIS — E785 Hyperlipidemia, unspecified: Secondary | ICD-10-CM | POA: Diagnosis not present

## 2023-12-24 DIAGNOSIS — Z556 Problems related to health literacy: Secondary | ICD-10-CM | POA: Diagnosis not present

## 2023-12-26 DIAGNOSIS — E785 Hyperlipidemia, unspecified: Secondary | ICD-10-CM | POA: Diagnosis not present

## 2023-12-26 DIAGNOSIS — Z556 Problems related to health literacy: Secondary | ICD-10-CM | POA: Diagnosis not present

## 2023-12-26 DIAGNOSIS — L97818 Non-pressure chronic ulcer of other part of right lower leg with other specified severity: Secondary | ICD-10-CM | POA: Diagnosis not present

## 2023-12-26 DIAGNOSIS — I2510 Atherosclerotic heart disease of native coronary artery without angina pectoris: Secondary | ICD-10-CM | POA: Diagnosis not present

## 2023-12-26 DIAGNOSIS — Z7982 Long term (current) use of aspirin: Secondary | ICD-10-CM | POA: Diagnosis not present

## 2023-12-26 DIAGNOSIS — Z87891 Personal history of nicotine dependence: Secondary | ICD-10-CM | POA: Diagnosis not present

## 2023-12-26 DIAGNOSIS — M810 Age-related osteoporosis without current pathological fracture: Secondary | ICD-10-CM | POA: Diagnosis not present

## 2023-12-26 DIAGNOSIS — N184 Chronic kidney disease, stage 4 (severe): Secondary | ICD-10-CM | POA: Diagnosis not present

## 2023-12-26 DIAGNOSIS — L03115 Cellulitis of right lower limb: Secondary | ICD-10-CM | POA: Diagnosis not present

## 2023-12-26 DIAGNOSIS — I129 Hypertensive chronic kidney disease with stage 1 through stage 4 chronic kidney disease, or unspecified chronic kidney disease: Secondary | ICD-10-CM | POA: Diagnosis not present

## 2023-12-26 DIAGNOSIS — I872 Venous insufficiency (chronic) (peripheral): Secondary | ICD-10-CM | POA: Diagnosis not present

## 2023-12-26 DIAGNOSIS — I252 Old myocardial infarction: Secondary | ICD-10-CM | POA: Diagnosis not present

## 2023-12-29 DIAGNOSIS — E785 Hyperlipidemia, unspecified: Secondary | ICD-10-CM | POA: Diagnosis not present

## 2023-12-29 DIAGNOSIS — L821 Other seborrheic keratosis: Secondary | ICD-10-CM | POA: Diagnosis not present

## 2023-12-29 DIAGNOSIS — Z85828 Personal history of other malignant neoplasm of skin: Secondary | ICD-10-CM | POA: Diagnosis not present

## 2023-12-29 DIAGNOSIS — Z556 Problems related to health literacy: Secondary | ICD-10-CM | POA: Diagnosis not present

## 2023-12-29 DIAGNOSIS — N184 Chronic kidney disease, stage 4 (severe): Secondary | ICD-10-CM | POA: Diagnosis not present

## 2023-12-29 DIAGNOSIS — I872 Venous insufficiency (chronic) (peripheral): Secondary | ICD-10-CM | POA: Diagnosis not present

## 2023-12-29 DIAGNOSIS — L570 Actinic keratosis: Secondary | ICD-10-CM | POA: Diagnosis not present

## 2023-12-29 DIAGNOSIS — M810 Age-related osteoporosis without current pathological fracture: Secondary | ICD-10-CM | POA: Diagnosis not present

## 2023-12-29 DIAGNOSIS — I252 Old myocardial infarction: Secondary | ICD-10-CM | POA: Diagnosis not present

## 2023-12-29 DIAGNOSIS — L0109 Other impetigo: Secondary | ICD-10-CM | POA: Diagnosis not present

## 2023-12-29 DIAGNOSIS — L03115 Cellulitis of right lower limb: Secondary | ICD-10-CM | POA: Diagnosis not present

## 2023-12-29 DIAGNOSIS — D224 Melanocytic nevi of scalp and neck: Secondary | ICD-10-CM | POA: Diagnosis not present

## 2023-12-29 DIAGNOSIS — L97818 Non-pressure chronic ulcer of other part of right lower leg with other specified severity: Secondary | ICD-10-CM | POA: Diagnosis not present

## 2023-12-29 DIAGNOSIS — D1801 Hemangioma of skin and subcutaneous tissue: Secondary | ICD-10-CM | POA: Diagnosis not present

## 2023-12-29 DIAGNOSIS — I129 Hypertensive chronic kidney disease with stage 1 through stage 4 chronic kidney disease, or unspecified chronic kidney disease: Secondary | ICD-10-CM | POA: Diagnosis not present

## 2023-12-29 DIAGNOSIS — Z87891 Personal history of nicotine dependence: Secondary | ICD-10-CM | POA: Diagnosis not present

## 2023-12-29 DIAGNOSIS — L578 Other skin changes due to chronic exposure to nonionizing radiation: Secondary | ICD-10-CM | POA: Diagnosis not present

## 2023-12-29 DIAGNOSIS — I2510 Atherosclerotic heart disease of native coronary artery without angina pectoris: Secondary | ICD-10-CM | POA: Diagnosis not present

## 2023-12-29 DIAGNOSIS — Z7982 Long term (current) use of aspirin: Secondary | ICD-10-CM | POA: Diagnosis not present

## 2023-12-30 DIAGNOSIS — N184 Chronic kidney disease, stage 4 (severe): Secondary | ICD-10-CM | POA: Diagnosis not present

## 2023-12-30 DIAGNOSIS — N390 Urinary tract infection, site not specified: Secondary | ICD-10-CM | POA: Diagnosis not present

## 2023-12-30 DIAGNOSIS — R609 Edema, unspecified: Secondary | ICD-10-CM | POA: Diagnosis not present

## 2023-12-30 DIAGNOSIS — I69354 Hemiplegia and hemiparesis following cerebral infarction affecting left non-dominant side: Secondary | ICD-10-CM | POA: Diagnosis not present

## 2023-12-30 DIAGNOSIS — J449 Chronic obstructive pulmonary disease, unspecified: Secondary | ICD-10-CM | POA: Diagnosis not present

## 2023-12-31 DIAGNOSIS — Z7982 Long term (current) use of aspirin: Secondary | ICD-10-CM | POA: Diagnosis not present

## 2023-12-31 DIAGNOSIS — I2510 Atherosclerotic heart disease of native coronary artery without angina pectoris: Secondary | ICD-10-CM | POA: Diagnosis not present

## 2023-12-31 DIAGNOSIS — L97818 Non-pressure chronic ulcer of other part of right lower leg with other specified severity: Secondary | ICD-10-CM | POA: Diagnosis not present

## 2023-12-31 DIAGNOSIS — E785 Hyperlipidemia, unspecified: Secondary | ICD-10-CM | POA: Diagnosis not present

## 2023-12-31 DIAGNOSIS — N184 Chronic kidney disease, stage 4 (severe): Secondary | ICD-10-CM | POA: Diagnosis not present

## 2023-12-31 DIAGNOSIS — I872 Venous insufficiency (chronic) (peripheral): Secondary | ICD-10-CM | POA: Diagnosis not present

## 2023-12-31 DIAGNOSIS — Z556 Problems related to health literacy: Secondary | ICD-10-CM | POA: Diagnosis not present

## 2023-12-31 DIAGNOSIS — M810 Age-related osteoporosis without current pathological fracture: Secondary | ICD-10-CM | POA: Diagnosis not present

## 2023-12-31 DIAGNOSIS — I252 Old myocardial infarction: Secondary | ICD-10-CM | POA: Diagnosis not present

## 2023-12-31 DIAGNOSIS — Z87891 Personal history of nicotine dependence: Secondary | ICD-10-CM | POA: Diagnosis not present

## 2023-12-31 DIAGNOSIS — I129 Hypertensive chronic kidney disease with stage 1 through stage 4 chronic kidney disease, or unspecified chronic kidney disease: Secondary | ICD-10-CM | POA: Diagnosis not present

## 2023-12-31 DIAGNOSIS — N390 Urinary tract infection, site not specified: Secondary | ICD-10-CM | POA: Diagnosis not present

## 2023-12-31 DIAGNOSIS — L03115 Cellulitis of right lower limb: Secondary | ICD-10-CM | POA: Diagnosis not present

## 2024-01-03 DIAGNOSIS — E785 Hyperlipidemia, unspecified: Secondary | ICD-10-CM | POA: Diagnosis not present

## 2024-01-03 DIAGNOSIS — Z87891 Personal history of nicotine dependence: Secondary | ICD-10-CM | POA: Diagnosis not present

## 2024-01-03 DIAGNOSIS — Z7982 Long term (current) use of aspirin: Secondary | ICD-10-CM | POA: Diagnosis not present

## 2024-01-03 DIAGNOSIS — Z556 Problems related to health literacy: Secondary | ICD-10-CM | POA: Diagnosis not present

## 2024-01-03 DIAGNOSIS — L03115 Cellulitis of right lower limb: Secondary | ICD-10-CM | POA: Diagnosis not present

## 2024-01-03 DIAGNOSIS — I872 Venous insufficiency (chronic) (peripheral): Secondary | ICD-10-CM | POA: Diagnosis not present

## 2024-01-03 DIAGNOSIS — I2510 Atherosclerotic heart disease of native coronary artery without angina pectoris: Secondary | ICD-10-CM | POA: Diagnosis not present

## 2024-01-03 DIAGNOSIS — L97818 Non-pressure chronic ulcer of other part of right lower leg with other specified severity: Secondary | ICD-10-CM | POA: Diagnosis not present

## 2024-01-03 DIAGNOSIS — N184 Chronic kidney disease, stage 4 (severe): Secondary | ICD-10-CM | POA: Diagnosis not present

## 2024-01-03 DIAGNOSIS — I252 Old myocardial infarction: Secondary | ICD-10-CM | POA: Diagnosis not present

## 2024-01-03 DIAGNOSIS — M810 Age-related osteoporosis without current pathological fracture: Secondary | ICD-10-CM | POA: Diagnosis not present

## 2024-01-03 DIAGNOSIS — I129 Hypertensive chronic kidney disease with stage 1 through stage 4 chronic kidney disease, or unspecified chronic kidney disease: Secondary | ICD-10-CM | POA: Diagnosis not present

## 2024-01-05 DIAGNOSIS — L03115 Cellulitis of right lower limb: Secondary | ICD-10-CM | POA: Diagnosis not present

## 2024-01-05 DIAGNOSIS — N184 Chronic kidney disease, stage 4 (severe): Secondary | ICD-10-CM | POA: Diagnosis not present

## 2024-01-05 DIAGNOSIS — Z556 Problems related to health literacy: Secondary | ICD-10-CM | POA: Diagnosis not present

## 2024-01-05 DIAGNOSIS — Z7982 Long term (current) use of aspirin: Secondary | ICD-10-CM | POA: Diagnosis not present

## 2024-01-05 DIAGNOSIS — I872 Venous insufficiency (chronic) (peripheral): Secondary | ICD-10-CM | POA: Diagnosis not present

## 2024-01-05 DIAGNOSIS — I129 Hypertensive chronic kidney disease with stage 1 through stage 4 chronic kidney disease, or unspecified chronic kidney disease: Secondary | ICD-10-CM | POA: Diagnosis not present

## 2024-01-05 DIAGNOSIS — L97818 Non-pressure chronic ulcer of other part of right lower leg with other specified severity: Secondary | ICD-10-CM | POA: Diagnosis not present

## 2024-01-05 DIAGNOSIS — E785 Hyperlipidemia, unspecified: Secondary | ICD-10-CM | POA: Diagnosis not present

## 2024-01-05 DIAGNOSIS — I252 Old myocardial infarction: Secondary | ICD-10-CM | POA: Diagnosis not present

## 2024-01-05 DIAGNOSIS — Z87891 Personal history of nicotine dependence: Secondary | ICD-10-CM | POA: Diagnosis not present

## 2024-01-05 DIAGNOSIS — M810 Age-related osteoporosis without current pathological fracture: Secondary | ICD-10-CM | POA: Diagnosis not present

## 2024-01-05 DIAGNOSIS — I2510 Atherosclerotic heart disease of native coronary artery without angina pectoris: Secondary | ICD-10-CM | POA: Diagnosis not present

## 2024-01-07 DIAGNOSIS — L97818 Non-pressure chronic ulcer of other part of right lower leg with other specified severity: Secondary | ICD-10-CM | POA: Diagnosis not present

## 2024-01-07 DIAGNOSIS — N184 Chronic kidney disease, stage 4 (severe): Secondary | ICD-10-CM | POA: Diagnosis not present

## 2024-01-07 DIAGNOSIS — I872 Venous insufficiency (chronic) (peripheral): Secondary | ICD-10-CM | POA: Diagnosis not present

## 2024-01-07 DIAGNOSIS — E785 Hyperlipidemia, unspecified: Secondary | ICD-10-CM | POA: Diagnosis not present

## 2024-01-07 DIAGNOSIS — I129 Hypertensive chronic kidney disease with stage 1 through stage 4 chronic kidney disease, or unspecified chronic kidney disease: Secondary | ICD-10-CM | POA: Diagnosis not present

## 2024-01-07 DIAGNOSIS — Z7982 Long term (current) use of aspirin: Secondary | ICD-10-CM | POA: Diagnosis not present

## 2024-01-07 DIAGNOSIS — Z87891 Personal history of nicotine dependence: Secondary | ICD-10-CM | POA: Diagnosis not present

## 2024-01-07 DIAGNOSIS — I2510 Atherosclerotic heart disease of native coronary artery without angina pectoris: Secondary | ICD-10-CM | POA: Diagnosis not present

## 2024-01-07 DIAGNOSIS — I252 Old myocardial infarction: Secondary | ICD-10-CM | POA: Diagnosis not present

## 2024-01-07 DIAGNOSIS — Z556 Problems related to health literacy: Secondary | ICD-10-CM | POA: Diagnosis not present

## 2024-01-07 DIAGNOSIS — M810 Age-related osteoporosis without current pathological fracture: Secondary | ICD-10-CM | POA: Diagnosis not present

## 2024-01-07 DIAGNOSIS — L03115 Cellulitis of right lower limb: Secondary | ICD-10-CM | POA: Diagnosis not present

## 2024-01-09 DIAGNOSIS — Z7982 Long term (current) use of aspirin: Secondary | ICD-10-CM | POA: Diagnosis not present

## 2024-01-09 DIAGNOSIS — N184 Chronic kidney disease, stage 4 (severe): Secondary | ICD-10-CM | POA: Diagnosis not present

## 2024-01-09 DIAGNOSIS — M810 Age-related osteoporosis without current pathological fracture: Secondary | ICD-10-CM | POA: Diagnosis not present

## 2024-01-09 DIAGNOSIS — Z556 Problems related to health literacy: Secondary | ICD-10-CM | POA: Diagnosis not present

## 2024-01-09 DIAGNOSIS — I129 Hypertensive chronic kidney disease with stage 1 through stage 4 chronic kidney disease, or unspecified chronic kidney disease: Secondary | ICD-10-CM | POA: Diagnosis not present

## 2024-01-09 DIAGNOSIS — I872 Venous insufficiency (chronic) (peripheral): Secondary | ICD-10-CM | POA: Diagnosis not present

## 2024-01-09 DIAGNOSIS — I2510 Atherosclerotic heart disease of native coronary artery without angina pectoris: Secondary | ICD-10-CM | POA: Diagnosis not present

## 2024-01-09 DIAGNOSIS — L03115 Cellulitis of right lower limb: Secondary | ICD-10-CM | POA: Diagnosis not present

## 2024-01-09 DIAGNOSIS — E785 Hyperlipidemia, unspecified: Secondary | ICD-10-CM | POA: Diagnosis not present

## 2024-01-09 DIAGNOSIS — L97818 Non-pressure chronic ulcer of other part of right lower leg with other specified severity: Secondary | ICD-10-CM | POA: Diagnosis not present

## 2024-01-09 DIAGNOSIS — I252 Old myocardial infarction: Secondary | ICD-10-CM | POA: Diagnosis not present

## 2024-01-09 DIAGNOSIS — Z87891 Personal history of nicotine dependence: Secondary | ICD-10-CM | POA: Diagnosis not present

## 2024-01-12 DIAGNOSIS — N184 Chronic kidney disease, stage 4 (severe): Secondary | ICD-10-CM | POA: Diagnosis not present

## 2024-01-12 DIAGNOSIS — I252 Old myocardial infarction: Secondary | ICD-10-CM | POA: Diagnosis not present

## 2024-01-12 DIAGNOSIS — M810 Age-related osteoporosis without current pathological fracture: Secondary | ICD-10-CM | POA: Diagnosis not present

## 2024-01-12 DIAGNOSIS — E785 Hyperlipidemia, unspecified: Secondary | ICD-10-CM | POA: Diagnosis not present

## 2024-01-12 DIAGNOSIS — I2510 Atherosclerotic heart disease of native coronary artery without angina pectoris: Secondary | ICD-10-CM | POA: Diagnosis not present

## 2024-01-12 DIAGNOSIS — L03115 Cellulitis of right lower limb: Secondary | ICD-10-CM | POA: Diagnosis not present

## 2024-01-12 DIAGNOSIS — Z7982 Long term (current) use of aspirin: Secondary | ICD-10-CM | POA: Diagnosis not present

## 2024-01-12 DIAGNOSIS — Z87891 Personal history of nicotine dependence: Secondary | ICD-10-CM | POA: Diagnosis not present

## 2024-01-12 DIAGNOSIS — I129 Hypertensive chronic kidney disease with stage 1 through stage 4 chronic kidney disease, or unspecified chronic kidney disease: Secondary | ICD-10-CM | POA: Diagnosis not present

## 2024-01-12 DIAGNOSIS — L97818 Non-pressure chronic ulcer of other part of right lower leg with other specified severity: Secondary | ICD-10-CM | POA: Diagnosis not present

## 2024-01-12 DIAGNOSIS — I872 Venous insufficiency (chronic) (peripheral): Secondary | ICD-10-CM | POA: Diagnosis not present

## 2024-01-12 DIAGNOSIS — Z556 Problems related to health literacy: Secondary | ICD-10-CM | POA: Diagnosis not present

## 2024-01-17 DIAGNOSIS — M810 Age-related osteoporosis without current pathological fracture: Secondary | ICD-10-CM | POA: Diagnosis not present

## 2024-01-17 DIAGNOSIS — Z7982 Long term (current) use of aspirin: Secondary | ICD-10-CM | POA: Diagnosis not present

## 2024-01-17 DIAGNOSIS — L03115 Cellulitis of right lower limb: Secondary | ICD-10-CM | POA: Diagnosis not present

## 2024-01-17 DIAGNOSIS — E785 Hyperlipidemia, unspecified: Secondary | ICD-10-CM | POA: Diagnosis not present

## 2024-01-17 DIAGNOSIS — I252 Old myocardial infarction: Secondary | ICD-10-CM | POA: Diagnosis not present

## 2024-01-17 DIAGNOSIS — Z556 Problems related to health literacy: Secondary | ICD-10-CM | POA: Diagnosis not present

## 2024-01-17 DIAGNOSIS — L97818 Non-pressure chronic ulcer of other part of right lower leg with other specified severity: Secondary | ICD-10-CM | POA: Diagnosis not present

## 2024-01-17 DIAGNOSIS — I129 Hypertensive chronic kidney disease with stage 1 through stage 4 chronic kidney disease, or unspecified chronic kidney disease: Secondary | ICD-10-CM | POA: Diagnosis not present

## 2024-01-17 DIAGNOSIS — N184 Chronic kidney disease, stage 4 (severe): Secondary | ICD-10-CM | POA: Diagnosis not present

## 2024-01-17 DIAGNOSIS — I872 Venous insufficiency (chronic) (peripheral): Secondary | ICD-10-CM | POA: Diagnosis not present

## 2024-01-17 DIAGNOSIS — I2510 Atherosclerotic heart disease of native coronary artery without angina pectoris: Secondary | ICD-10-CM | POA: Diagnosis not present

## 2024-01-17 DIAGNOSIS — Z87891 Personal history of nicotine dependence: Secondary | ICD-10-CM | POA: Diagnosis not present

## 2024-01-18 DIAGNOSIS — N390 Urinary tract infection, site not specified: Secondary | ICD-10-CM | POA: Diagnosis not present

## 2024-01-24 DIAGNOSIS — L97818 Non-pressure chronic ulcer of other part of right lower leg with other specified severity: Secondary | ICD-10-CM | POA: Diagnosis not present

## 2024-01-24 DIAGNOSIS — I129 Hypertensive chronic kidney disease with stage 1 through stage 4 chronic kidney disease, or unspecified chronic kidney disease: Secondary | ICD-10-CM | POA: Diagnosis not present

## 2024-01-24 DIAGNOSIS — I872 Venous insufficiency (chronic) (peripheral): Secondary | ICD-10-CM | POA: Diagnosis not present

## 2024-01-24 DIAGNOSIS — M810 Age-related osteoporosis without current pathological fracture: Secondary | ICD-10-CM | POA: Diagnosis not present

## 2024-01-24 DIAGNOSIS — Z87891 Personal history of nicotine dependence: Secondary | ICD-10-CM | POA: Diagnosis not present

## 2024-01-24 DIAGNOSIS — Z556 Problems related to health literacy: Secondary | ICD-10-CM | POA: Diagnosis not present

## 2024-01-24 DIAGNOSIS — E785 Hyperlipidemia, unspecified: Secondary | ICD-10-CM | POA: Diagnosis not present

## 2024-01-24 DIAGNOSIS — I2510 Atherosclerotic heart disease of native coronary artery without angina pectoris: Secondary | ICD-10-CM | POA: Diagnosis not present

## 2024-01-24 DIAGNOSIS — N184 Chronic kidney disease, stage 4 (severe): Secondary | ICD-10-CM | POA: Diagnosis not present

## 2024-01-24 DIAGNOSIS — I252 Old myocardial infarction: Secondary | ICD-10-CM | POA: Diagnosis not present

## 2024-01-24 DIAGNOSIS — L03115 Cellulitis of right lower limb: Secondary | ICD-10-CM | POA: Diagnosis not present

## 2024-01-24 DIAGNOSIS — Z7982 Long term (current) use of aspirin: Secondary | ICD-10-CM | POA: Diagnosis not present

## 2024-01-27 DIAGNOSIS — M542 Cervicalgia: Secondary | ICD-10-CM | POA: Diagnosis not present

## 2024-01-27 DIAGNOSIS — N390 Urinary tract infection, site not specified: Secondary | ICD-10-CM | POA: Diagnosis not present

## 2024-02-03 DIAGNOSIS — L03115 Cellulitis of right lower limb: Secondary | ICD-10-CM | POA: Diagnosis not present

## 2024-02-03 DIAGNOSIS — I129 Hypertensive chronic kidney disease with stage 1 through stage 4 chronic kidney disease, or unspecified chronic kidney disease: Secondary | ICD-10-CM | POA: Diagnosis not present

## 2024-02-03 DIAGNOSIS — I252 Old myocardial infarction: Secondary | ICD-10-CM | POA: Diagnosis not present

## 2024-02-03 DIAGNOSIS — E785 Hyperlipidemia, unspecified: Secondary | ICD-10-CM | POA: Diagnosis not present

## 2024-02-03 DIAGNOSIS — I2510 Atherosclerotic heart disease of native coronary artery without angina pectoris: Secondary | ICD-10-CM | POA: Diagnosis not present

## 2024-02-03 DIAGNOSIS — M810 Age-related osteoporosis without current pathological fracture: Secondary | ICD-10-CM | POA: Diagnosis not present

## 2024-02-03 DIAGNOSIS — L97818 Non-pressure chronic ulcer of other part of right lower leg with other specified severity: Secondary | ICD-10-CM | POA: Diagnosis not present

## 2024-02-03 DIAGNOSIS — Z87891 Personal history of nicotine dependence: Secondary | ICD-10-CM | POA: Diagnosis not present

## 2024-02-03 DIAGNOSIS — Z7982 Long term (current) use of aspirin: Secondary | ICD-10-CM | POA: Diagnosis not present

## 2024-02-03 DIAGNOSIS — Z556 Problems related to health literacy: Secondary | ICD-10-CM | POA: Diagnosis not present

## 2024-02-03 DIAGNOSIS — I872 Venous insufficiency (chronic) (peripheral): Secondary | ICD-10-CM | POA: Diagnosis not present

## 2024-02-03 DIAGNOSIS — N184 Chronic kidney disease, stage 4 (severe): Secondary | ICD-10-CM | POA: Diagnosis not present

## 2024-02-07 DIAGNOSIS — Z7982 Long term (current) use of aspirin: Secondary | ICD-10-CM | POA: Diagnosis not present

## 2024-02-07 DIAGNOSIS — E785 Hyperlipidemia, unspecified: Secondary | ICD-10-CM | POA: Diagnosis not present

## 2024-02-07 DIAGNOSIS — I129 Hypertensive chronic kidney disease with stage 1 through stage 4 chronic kidney disease, or unspecified chronic kidney disease: Secondary | ICD-10-CM | POA: Diagnosis not present

## 2024-02-07 DIAGNOSIS — Z556 Problems related to health literacy: Secondary | ICD-10-CM | POA: Diagnosis not present

## 2024-02-07 DIAGNOSIS — I252 Old myocardial infarction: Secondary | ICD-10-CM | POA: Diagnosis not present

## 2024-02-07 DIAGNOSIS — M810 Age-related osteoporosis without current pathological fracture: Secondary | ICD-10-CM | POA: Diagnosis not present

## 2024-02-07 DIAGNOSIS — I872 Venous insufficiency (chronic) (peripheral): Secondary | ICD-10-CM | POA: Diagnosis not present

## 2024-02-07 DIAGNOSIS — L97818 Non-pressure chronic ulcer of other part of right lower leg with other specified severity: Secondary | ICD-10-CM | POA: Diagnosis not present

## 2024-02-07 DIAGNOSIS — L03115 Cellulitis of right lower limb: Secondary | ICD-10-CM | POA: Diagnosis not present

## 2024-02-07 DIAGNOSIS — I2510 Atherosclerotic heart disease of native coronary artery without angina pectoris: Secondary | ICD-10-CM | POA: Diagnosis not present

## 2024-02-07 DIAGNOSIS — Z87891 Personal history of nicotine dependence: Secondary | ICD-10-CM | POA: Diagnosis not present

## 2024-02-07 DIAGNOSIS — N184 Chronic kidney disease, stage 4 (severe): Secondary | ICD-10-CM | POA: Diagnosis not present

## 2024-02-14 ENCOUNTER — Other Ambulatory Visit: Payer: Self-pay | Admitting: Family Medicine

## 2024-02-14 DIAGNOSIS — Z1231 Encounter for screening mammogram for malignant neoplasm of breast: Secondary | ICD-10-CM

## 2024-02-25 DIAGNOSIS — R21 Rash and other nonspecific skin eruption: Secondary | ICD-10-CM | POA: Diagnosis not present

## 2024-02-29 ENCOUNTER — Ambulatory Visit
Admission: RE | Admit: 2024-02-29 | Discharge: 2024-02-29 | Disposition: A | Discharge status: Home / Self Care | Source: Ambulatory Visit | Attending: Family Medicine | Admitting: Family Medicine

## 2024-02-29 DIAGNOSIS — Z1231 Encounter for screening mammogram for malignant neoplasm of breast: Secondary | ICD-10-CM

## 2024-03-03 DIAGNOSIS — H401133 Primary open-angle glaucoma, bilateral, severe stage: Secondary | ICD-10-CM | POA: Diagnosis not present

## 2024-03-20 DIAGNOSIS — J441 Chronic obstructive pulmonary disease with (acute) exacerbation: Secondary | ICD-10-CM | POA: Diagnosis not present

## 2024-04-27 DIAGNOSIS — J441 Chronic obstructive pulmonary disease with (acute) exacerbation: Secondary | ICD-10-CM | POA: Diagnosis not present

## 2024-04-27 DIAGNOSIS — S80811A Abrasion, right lower leg, initial encounter: Secondary | ICD-10-CM | POA: Diagnosis not present

## 2024-05-29 ENCOUNTER — Encounter: Payer: Self-pay | Admitting: Radiology

## 2024-05-30 ENCOUNTER — Other Ambulatory Visit: Payer: Self-pay | Admitting: Nurse Practitioner

## 2024-06-20 ENCOUNTER — Encounter: Payer: Self-pay | Admitting: Adult Health

## 2024-06-20 ENCOUNTER — Ambulatory Visit: Admitting: Adult Health

## 2024-06-20 VITALS — BP 135/60 | HR 71 | Ht 59.0 in | Wt 134.0 lb

## 2024-06-20 DIAGNOSIS — B029 Zoster without complications: Secondary | ICD-10-CM | POA: Diagnosis not present

## 2024-06-20 DIAGNOSIS — R4189 Other symptoms and signs involving cognitive functions and awareness: Secondary | ICD-10-CM | POA: Diagnosis not present

## 2024-06-20 DIAGNOSIS — Z8673 Personal history of transient ischemic attack (TIA), and cerebral infarction without residual deficits: Secondary | ICD-10-CM

## 2024-06-20 NOTE — Patient Instructions (Addendum)
 Your Plan:  Continue routine follow up with your PCP for stroke risk factor management   Beers criteria - this site can list medications that are not safe for the elderly   If there are any questions regarding starting a supplement, you can contact your pharmacists to discuss safety with your current medications  Ensuring good management of stroke risk factors, routine exercise, healthy diet, good sleep and routine socialization can all help slow memory decline       Follow up in 1 year or call earlier if needed       There are many other simply and effective ways to potentially reduce the changes that your condition will worsen over time. These generally include lifestyle changes, such as diet and exercise.   There is good quality evidence from at least one large scale study that a modified Mediterranean diet may help slow cognitive decline. This is known as the MIND diet. The Mind diet is not so much a specific diet as it is a set of recommendations for things that you should and should not eat.   Foods that are ENCOURAGED on the MIND Diet:  Green, leafy vegetables: Aim for six or more servings per week. This includes kale, spinach, cooked greens and salads.  All other vegetables: Try to eat another vegetable in addition to the green leafy vegetables at least once a day. It is best to choose non-starchy vegetables because they have a lot of nutrients with a low number of calories.  Berries: Eat berries at least twice a week. There is a plethora of research on strawberries, and other berries such as blueberries, raspberries and blackberries have also been found to have antioxidant and brain health benefits.  Nuts: Try to get five servings of nuts or more each week. The creators of the MIND diet don't specify what kind of nuts to consume, but it is probably best to vary the type of nuts you eat to obtain a variety of nutrients. Peanuts are a legume and do not fall into this category.   Olive oil: Use olive oil as your main cooking oil. There may be other heart-healthy alternatives such as algae oil, though there is not yet sufficient research upon which to base a formal recommendation.  Whole grains: Aim for at least three servings daily. Choose minimally processed grains like oatmeal, quinoa, brown rice, whole-wheat pasta and 100% whole-wheat bread.  Fish: Eat fish at least once a week. It is best to choose fatty fish like salmon, sardines, trout, tuna and mackerel for their high amounts of omega-3 fatty acids.  Beans: Include beans in at least four meals every week. This includes all beans, lentils and soybeans.  Poultry: Try to eat chicken or turkey at least twice a week. Note that fried chicken is not encouraged on the MIND diet.  Wine: Aim for no more than one glass of alcohol daily. Both red and white wine may benefit the brain. However, much research has focused on the red wine compound resveratrol, which may help protect against Alzheimer's disease.  Foods that are DISCOURAGED on the MIND Diet: Butter and margarine: Try to eat less than 1 tablespoon (about 14 grams) daily. Instead, try using olive oil as your primary cooking fat, and dipping your bread in olive oil with herbs.  Cheese: The MIND diet recommends limiting your cheese consumption to less than once per week.  Red meat: Aim for no more than three servings each week. This includes all beef, pork,  lamb and products made from these meats.  Brien food: The MIND diet highly discourages fried food, especially the kind from fast-food restaurants. Limit your consumption to less than once per week.  Pastries and sweets: This includes most of the processed junk food and desserts you can think of. Ice cream, cookies, brownies, snack cakes, donuts, candy and more. Try to limit these to no more than four times a week.  If you are interested in more information or explicit guidance about recipes consistent with the MIND diet  way of eating, I can recommend Diet for the Mind by [place] Estefana Blumenthal, Ph.D., the creator of the MIND diet.   Exercise is one of the best medicines for promoting health and maintaining cognitive fitness at all stages in life. Exercise probably has the largest documented effect on brain health and performance of any lifestyle intervention. Studies have shown that even previously sedentary individuals who start exercising as late as age 37 show a significant survival benefit as compared to their non-exercising peers. In the [place] , the current guidelines are for 30 minutes of moderate exercise per day, but increasing your activity level less than that may also be helpful. Of course, if you have an underlying medical condition or there is any question about whether it is safe for you to exercise, you should consult a medical treatment provider prior to beginning exercise.

## 2024-06-20 NOTE — Progress Notes (Signed)
 [facility] [address] [place]. [place] [address] [phone]       STROKE FOLLOW UP NOTE  [name] Date of Birth:  [birth year] Medical Record Number:  [id number]   Reason for visit: headache    SUBJECTIVE:   CHIEF COMPLAINT:  Chief Complaint  Patient presents with   RM 3     Patient is here with daughter for stroke follow-up - no concerns just wanted to check in with the doctor. She had shingles in her scalp and is clearing up. Thinks this was the cause of pressure on her brain per the daughter     HPI:   Update 06/20/2024 JM: Patient is being seen for acute visit due to onset of left sided headache and confusion upon awakening on 11/18. Was seen at [facility] on 11/20 with found to have lesions over left forehead and on scalp. Completed CT head which was negative for acute findings and was started on valacyclovir. Reports lesions gradually improving, using Elma ointment for pain relief with good benefit and improvement of head pain. No recurrent confusion episodes upon awakening, per daughter confusion only lasted for a few minutes.   Notes cognition has gradually declined over the past year. Was started on memantine by PCP but only able to tolerate 7.5 mg daily due to increased constipation on higher dose.  Daughter has multiple questions regarding use of supplements to help slow cognitive decline as well as other nonpharmacological measures that can be done at home to help slow decline.  Reports she sleeps well at night.  She has a good appetite.  Ambulates with rollator walker, no recent falls. Does take short walks on occasion.  Reports routine follow-up with PCP for stroke risk factor management.  Reports compliance on aspirin  and atorvastatin .  Blood pressure well-controlled.  Reports routine lab work by PCP.      History provided for reference purposes only Update 04/08/2023 JM: Patient previously seen back in January for stroke follow-up and  released at that time as stable from stroke standpoint.  Family requested follow-up visit today for routine stroke follow up and memory concerns, she is accompanied by her daughter.  Reports continued occasional word finding difficulty, present since prior stroke, daughter notes son has been concerned about occasional mood changes, more so in the evening. Daughter and patient deny memory loss. Mentions one instance where patient was asking son to turn off a light and when he wasn't able to immediately do this, she became very upset and was yelling, she remembers this occurring, no confusion present, reports being frustrated as she was unable to do this for herself. Does have chronic right eye vision loss, has had some left eye vision decline, being followed by [facility] Dr. Grisson for glaucoma and macular degeneration - looking at either laser or surgical correction. Mentions x2 episode of left hand numbness usually after hand use such as holding news paper, no numbness in face or leg. Did have a fall back in April resulting in left hip fracture requiring cephalomedullary nailing, ambulation limited since, ambulates short distance with rollator walker or cane otherwise transfers via w/c, denies any additional falls, followed by ortho, waiting to start aquatic therapy. Compliant on aspirin  and atorvastatin .   Blood pressure slightly elevated today but has not taken BP meds yet today, usually takes around lunch time. Occasionally monitors at home and has been stable. Routinely follows with PCP for stroke risk factor management.  Update 08/20/2022 JM: Patient returns  for 55-month stroke follow-up accompanied by her son.  Reports residual occasional word finding difficulty or delayed finding and occasional slurred speech but has improved since prior visit.  Denies new stroke/TIA symptoms.  Remains on aspirin  and atorvastatin  Blood pressure well-controlled Routinely follows with PCP.  Initial visit  02/17/2022 JM: Patient is being seen for initial hospital follow-up accompanied by her son.  Has been doing well since discharge.  Reports residual occasional word finding difficulty. Denies any weakness. Will have some gait difficulty if sitting too long.  Complains of left leg pain which she reports has been present since her hospitalization. Also notes increased fatigue since stroke.  Use of cane with long distance or uneven ground, no recent falls. Was seen by [facility] PT/SLP and was released. Plans on getting set up with [facility] OT soon. Lives with her son and daughter lives near by. Able to maintain ADLs independently. Denies new stroke/TIA symptoms.   Is wearing a neck brace for a pinched nerve in her neck, reports previously seen by Ortho and was told due to arthritis and nothing could be done.  Neck brace helps with the pain as it helps to limit/protect cervical ROM.  This is a chronic issue and denies any worsening.  Completed 3 weeks DAPT, remains on aspirin  alone as well as atorvastatin , denies side effects. Blood pressures today 135/65 - occasionally monitored at home which has been stable  Has since seen PCP since discharge, has f/u next month  No further concerns at this time  Stroke admission 12/28/2021 Ms. COZY [name] is a 89 y.o. female with history of CKD, osteoporosis, HLD, squamous cell carcinoma, glaucoma, colon cancer and HTN who presented on 12/28/2021 with acute onset slurred speech.  Personally reviewed hospitalization pertinent progress notes, lab work and imaging.  Evaluated by Dr. Rosemarie for small acute infarct in left corona radiata likely secondary to small vessel disease.  MRA head/neck negative LVO, no carotid stenosis or intracranial vessel stenosis.  EF 60 to 65%.  LDL 130.  A1c 5.5.  Recommended DAPT for 3 weeks and aspirin  alone as well as initiate atorvastatin  40 mg daily.  No prior stroke history.  Therapy eval's recommended home health PT/OT     PERTINENT  IMAGING  Per hospitalizations 02/17/2022 CT head No acute abnormality.  MRI  small acute infarct in left corona radiata, chronic microvascular ischemic changes MRA head and neck no LVO, no carotid stenosis, no intracranial vessel stenosis 2D Echo EF 60-65%, no atrial level shunt LDL 130 HgbA1c 5.5    ROS:   14 system review of systems performed and negative with exception of those listed in HPI  PMH:  Past Medical History:  Diagnosis Date   CKD (chronic kidney disease), stage III (HCC)    Family history of breast cancer    mother   Family history of colon cancer    father   Glaucoma    Osteopenia    SBO (small bowel obstruction) (HCC)     PSH:  Past Surgical History:  Procedure Laterality Date   APPENDECTOMY     CESAREAN SECTION     x6   CORONARY STENT INTERVENTION N/A 06/08/2022   Procedure: CORONARY STENT INTERVENTION;  Surgeon: Burnard Debby LABOR, MD;  Location: [facility] INVASIVE CV LAB;  Service: Cardiovascular;  Laterality: N/A;   ESOPHAGOGASTRODUODENOSCOPY  07/07/2012   Procedure: ESOPHAGOGASTRODUODENOSCOPY (EGD);  Surgeon: Elsie Cree, MD;  Location: [facility] ENDOSCOPY;  Service: Endoscopy;  Laterality: N/A;   GLAUCOMA SURGERY Bilateral 02/17/2022  HIP SURGERY     2 rods were put on left hip   LEFT COLECTOMY  04/30/2011   [facility]   LEFT HEART CATH AND CORONARY ANGIOGRAPHY N/A 06/08/2022   Procedure: LEFT HEART CATH AND CORONARY ANGIOGRAPHY;  Surgeon: Burnard Debby LABOR, MD;  Location: [facility] INVASIVE CV LAB;  Service: Cardiovascular;  Laterality: N/A;   tubal cyst  04/30/2011   L fallopian tube cyst incidentally found    Social History:  Social History   Socioeconomic History   Marital status: Widowed    Spouse name: Not on file   Number of children: 6   Years of education: Not on file   Highest education level: Not on file  Occupational History   Not on file  Tobacco Use   Smoking status: Former    Average packs/day: 0.5 packs/day for 68.0 years (34.0 ttl pk-yrs)     Types: Cigarettes    Start date: 89   Smokeless tobacco: Not on file  Vaping Use   Vaping status: Never Used  Substance and Sexual Activity   Alcohol use: No    Comment: rare   Drug use: No   Sexual activity: Not Currently  Other Topics Concern   Not on file  Social History Narrative   1-2 cup of black coffee daily    Social Drivers of Health   Financial Resource Strain: Low Risk (11/20/2022)   Received [facility]   Overall Financial Resource Strain (CARDIA)    Difficulty of Paying Living Expenses: Not hard at all  Food Insecurity: Low Risk  (06/18/2024)   Received from [facility]   Hunger Vital Sign    Within the past 12 months, you worried that your food would run out before you got money to buy more: Never true    Within the past 12 months, the food you bought just didn't last and you didn't have money to get more. : Never true  Transportation Needs: No Transportation Needs (06/18/2024)   Received from [facility]    In the past 12 months, has lack of reliable transportation kept you from medical appointments, meetings, work or from getting things needed for daily living? : No  Physical Activity: Not on file  Stress: Not on file  Social Connections: Not on file  Intimate Partner Violence: Not At Risk (11/20/2022)   Received from [facility]   Humiliation, Afraid, Rape, and Kick questionnaire    Within the last year, have you been raped or forced to have any kind of sexual activity by your partner or ex-partner?: No    Within the last year, have you been kicked, hit, slapped, or otherwise physically hurt by your partner or ex-partner?: No    Within the last year, have you been humiliated or emotionally abused in other ways by your partner or ex-partner?: No    Within the last year, have you been afraid of your partner or ex-partner?: No    Family History:  Family History  Problem Relation Age of Onset    Breast cancer Mother    Colon cancer Father    Diabetes Sister    Seizures Neg Hx    Stroke Neg Hx    Migraines Neg Hx     Medications:   Current Outpatient Medications on File Prior to Visit  Medication Sig Dispense Refill   acetaminophen  (TYLENOL ) 500 MG tablet Take 1,000 mg by mouth every 6 (six) hours as needed.  aspirin  EC 81 MG tablet Take 1 tablet (81 mg total) by mouth daily. Swallow whole. 30 tablet 12   atorvastatin  (LIPITOR) 40 MG tablet Take 1 tablet (40 mg total) by mouth daily. 30 tablet 11   brimonidine (ALPHAGAN) 0.2 % ophthalmic solution Place 1 drop into both eyes 3 (three) times daily.     dorzolamide -timolol  (COSOPT ) 22.3-6.8 MG/ML ophthalmic solution 1 drop 2 (two) times daily.       furosemide  (LASIX ) 20 MG tablet Take 1 tablet (20 mg total) by mouth daily.     ipratropium-albuterol (DUONEB) 0.5-2.5 (3) MG/3ML SOLN      latanoprost  (XALATAN ) 0.005 % ophthalmic solution Place 1 drop into both eyes every evening.     memantine (NAMENDA) 5 MG tablet Take 1.5 tablets by mouth daily.     metoprolol  tartrate (LOPRESSOR ) 25 MG tablet TAKE 1/2 TABLET BY MOUTH 2 TIMES A DAY 90 tablet 1   nitroGLYCERIN  (NITROSTAT ) 0.4 MG SL tablet Place 1 tablet (0.4 mg total) under the tongue every 5 (five) minutes as needed for chest pain. 30 tablet 12   cyclobenzaprine (FLEXERIL) 5 MG tablet Take 5 mg by mouth as needed. (Patient not taking: Reported on 06/20/2024)     doxycycline (VIBRAMYCIN) 100 MG capsule Take 100 mg by mouth 2 (two) times daily. (Patient not taking: Reported on 06/20/2024)     No current facility-administered medications on file prior to visit.    Allergies:   Allergies  Allergen Reactions   Actonel [Risedronate] Nausea And Vomiting   Fosamax [Alendronate] Nausea And Vomiting   Miacalcin Other (See Comments)    Unknown reaction   Neomycin Other (See Comments)    Unknown reaction   Neosporin [Neomycin-Polymyxin-Gramicidin] Swelling   Neurontin [Gabapentin]  Other (See Comments)    Confusion   Codeine Palpitations      OBJECTIVE:  Physical Exam  Vitals:   06/20/24 0800  BP: 135/60  Pulse: 71  Weight: 134 lb (60.8 kg)  Height: 4' 11 (1.499 m)    Body mass index is 27.06 kg/m. No results found.   General: well developed, well nourished, very pleasant elderly Caucasian female, seated, in no evident distress seated on rollator walker Head: several erythematous slightly raised lesions over left side of forehead and left side of scalp, crustered over, no drainage   Neurologic Exam Mental Status: Awake and fully alert.  Occasional word finding difficulty.  Able to follow commands without difficulty. Oriented to place and time. Recent memory mildly impaired and remote memory intact. Attention span, concentration and fund of knowledge appropriate during visit although daughter provided majority of history. Mood and affect appropriate.  Cranial Nerves: Pupils equal, briskly reactive to light. Extraocular movements full without nystagmus. OD vision loss. Hearing intact. Facial sensation intact. Face, tongue, palate moves normally and symmetrically.  Motor: Normal bulk and tone. Normal strength in all tested extremity muscles Sensory.: intact to touch , pinprick , position and vibratory sensation.  Coordination: Rapid alternating movements normal in all extremities. Finger-to-nose and heel-to-shin performed accurately bilaterally. Gait and Station: stands from seated position with mild difficulty. Slow cautious antalgic gait with use of rollator walker Reflexes: 1+ and symmetric. Toes downgoing.        ASSESSMENT: Laurie [name] is a 89 y.o. year old female with left corona radiata infarct on 12/28/2021 likely secondary to small vessel disease with residual speech deficit. Vascular risk factors include HTN, HLD, advanced age and former tobacco use.  Presents today with new onset left sided  head pain likely due to shingles (appointment  scheduled but due to wait time, was seen at [facility] clinic), CT head negative. Family notes mild cognitive decline over the past year.      PLAN:  Herpes zoster Complete valacyclovir on 11/28 Continue use of Elma ointment  Cognitive impairment Notes gradual decline over the past year Advised to monitor for any worsening of confusion upon awakening, not occurring frequently and resolves quickly.  Advised to call if this should become more frequent.  Unable to complete MMSE due to time constraints of visit, will plan on obtaining a follow-up visit Prolonged discussion regarding possible benefit with supplements and nonpharmacological measures to help slow decline with additional information provided in AVS Discussed importance of ensuring good management of vascular risk factors  Currently on memantine 7.5 mg daily per PCP, unable to tolerate higher dose  Hx of left CR stroke:  Residual deficit: occasional aphasia  Continue aspirin  81 mg daily  and atorvastatin  40 mg daily for secondary stroke prevention managed/prescribed by PCP Discussed secondary stroke prevention measures and importance of close PCP follow up for aggressive stroke risk factor management including BP goal<130/90 and HLD with LDL goal<70  I have gone over the pathophysiology of stroke, warning signs and symptoms, risk factors and their management in some detail with instructions to go to the closest emergency room for symptoms of concern.     Per daughter request, follow up in 1 year or call earlier if needed     CC:  PCP: Okey Carlin Redbird, MD    I personally spent a total of 65 minutes in the care of the patient today including preparing to see the patient, getting/reviewing separately obtained history, performing a medically appropriate exam/evaluation, counseling and educating, referring and communicating with other health care professionals, and documenting clinical information in the EHR.   Harlene Bogaert,  AGNP-BC  [facility] [address] [place], [place] [address]  Phone [phone] Fax [phone] Note: This document was prepared with digital dictation and possible smart phrase technology. Any transcriptional errors that result from this process are unintentional.

## 2024-08-22 ENCOUNTER — Encounter: Payer: Self-pay | Admitting: Nurse Practitioner

## 2024-08-22 ENCOUNTER — Ambulatory Visit: Attending: Nurse Practitioner | Admitting: Nurse Practitioner

## 2024-08-22 VITALS — BP 118/62 | HR 62 | Ht 59.0 in | Wt 136.0 lb

## 2024-08-22 DIAGNOSIS — I10 Essential (primary) hypertension: Secondary | ICD-10-CM | POA: Diagnosis not present

## 2024-08-22 DIAGNOSIS — E785 Hyperlipidemia, unspecified: Secondary | ICD-10-CM | POA: Diagnosis not present

## 2024-08-22 DIAGNOSIS — N1832 Chronic kidney disease, stage 3b: Secondary | ICD-10-CM

## 2024-08-22 DIAGNOSIS — I2510 Atherosclerotic heart disease of native coronary artery without angina pectoris: Secondary | ICD-10-CM | POA: Diagnosis not present

## 2024-08-22 DIAGNOSIS — R600 Localized edema: Secondary | ICD-10-CM

## 2024-08-22 DIAGNOSIS — Z8673 Personal history of transient ischemic attack (TIA), and cerebral infarction without residual deficits: Secondary | ICD-10-CM | POA: Diagnosis not present

## 2024-08-22 NOTE — Patient Instructions (Signed)
 Medication Instructions:  Your physician recommends that you continue on your current medications as directed. Please refer to the Current Medication list given to you today.  *If you need a refill on your cardiac medications before your next appointment, please call your pharmacy*  Lab Work: NONE  If you have labs (blood work) drawn today and your tests are completely normal, you will receive your results only by: MyChart Message (if you have MyChart) OR A paper copy in the mail If you have any lab test that is abnormal or we need to change your treatment, we will call you to review the results.  Testing/Procedures: Your physician has requested that you have an echocardiogram. Echocardiography is a painless test that uses sound waves to create images of your heart. It provides your doctor with information about the size and shape of your heart and how well your hearts chambers and valves are working. This procedure takes approximately one hour. There are no restrictions for this procedure. Please do NOT wear cologne, perfume, aftershave, or lotions (deodorant is allowed). Please arrive 15 minutes prior to your appointment time.  Please note: We ask at that you not bring children with you during ultrasound (echo/ vascular) testing. Due to room size and safety concerns, children are not allowed in the ultrasound rooms during exams. Our front office staff cannot provide observation of children in our lobby area while testing is being conducted. An adult accompanying a patient to their appointment will only be allowed in the ultrasound room at the discretion of the ultrasound technician under special circumstances. We apologize for any inconvenience.   Follow-Up: At [facility], you and your health needs are our priority.  As part of our continuing mission to provide you with exceptional heart care, our providers are all part of one team.  This team includes your primary Cardiologist  (physician) and Advanced Practice Providers or APPs (Physician Assistants and Nurse Practitioners) who all work together to provide you with the care you need, when you need it.  Your next appointment:   6 month(s)  Provider:   Redell Shallow, MD

## 2024-08-22 NOTE — Progress Notes (Signed)
 "  Office Visit    Patient Name: Laurie [name] Date of Encounter: 08/22/2024  Primary Care Provider:  Okey Carlin Redbird, MD Primary Cardiologist:  Redell Shallow, MD  Chief Complaint    89 year old female with a history of CAD s/p NSTEMI, DES-RCA in 05/2022, bilateral lower extremity edema, hypertension, hyperlipidemia, CVA, CKD stage IIIb, hiatal hernia and former tobacco use who presents for follow-up related to CAD.    Past Medical History    Past Medical History:  Diagnosis Date   CKD (chronic kidney disease), stage III (HCC)    Family history of breast cancer    mother   Family history of colon cancer    father   Glaucoma    Osteopenia    SBO (small bowel obstruction) (HCC)    Past Surgical History:  Procedure Laterality Date   APPENDECTOMY     CESAREAN SECTION     x6   CORONARY STENT INTERVENTION N/A 06/08/2022   Procedure: CORONARY STENT INTERVENTION;  Surgeon: Burnard Debby LABOR, MD;  Location: [facility];  Service: Cardiovascular;  Laterality: N/A;   ESOPHAGOGASTRODUODENOSCOPY  07/07/2012   Procedure: ESOPHAGOGASTRODUODENOSCOPY (EGD);  Surgeon: Elsie Cree, MD;  Location: [facility] ENDOSCOPY;  Service: Endoscopy;  Laterality: N/A;   GLAUCOMA SURGERY Bilateral 02/17/2022   HIP SURGERY     2 rods were put on left hip   LEFT COLECTOMY  04/30/2011   Sr Streck   LEFT HEART CATH AND CORONARY ANGIOGRAPHY N/A 06/08/2022   Procedure: LEFT HEART CATH AND CORONARY ANGIOGRAPHY;  Surgeon: Burnard Debby LABOR, MD;  Location: [facility] CV [facility];  Service: Cardiovascular;  Laterality: N/A;   tubal cyst  04/30/2011   L fallopian tube cyst incidentally found    Allergies  Allergies[1]   Labs/Other Studies Reviewed    The following studies were reviewed today:  Cardiac Studies & Procedures   ______________________________________________________________________________________________ CARDIAC CATHETERIZATION  CARDIAC CATHETERIZATION 06/08/2022  Conclusion   Mid  RCA to Dist RCA lesion is 40% stenosed.   Dist RCA lesion is 99% stenosed.   Prox RCA-1 lesion is 50% stenosed.   Prox RCA-2 lesion is 50% stenosed.   Prox LAD lesion is 20% stenosed.   A drug-eluting stent was successfully placed.   Post intervention, there is a 0% residual stenosis.   Post intervention, there is a 0% residual stenosis.  NSTEMI secondary to subtotal 99% stenosis of the RCA after the acute margin proximal to the PDA takeoff in a dominant RCA vessel.  There are segmental 50% proximal stenosis on proximal bend of the RCA vessel.  Mildly calcified proximal LAD with 20% narrowing.  Normal but tortuous left circumflex coronary artery  LVEDP 7 mmHg  Difficult but successful percutaneous coronary convention utilizing a telescope guide extension catheter with ultimate PCI and DES stenting insertion of a 3.5 x 22 mm Onyx frontier stent postdilated to 3.75 mm with the distal stenoses being reduced to 0% and brisk TIMI-3 flow.  RECOMMENDATION: DAPT for minimum of 12 months.  Initial medical therapy for concomitant CAD.  Aggressive lipid-lowering therapy with target LDL less than 70.  Findings Coronary Findings Diagnostic  Dominance: Right  Left Anterior Descending Prox LAD lesion is 20% stenosed. The lesion is calcified.  Right Coronary Artery Vessel is large. Prox RCA-1 lesion is 50% stenosed. Prox RCA-2 lesion is 50% stenosed. Mid RCA to Dist RCA lesion is 40% stenosed. Dist RCA lesion is 99% stenosed.  Intervention  Mid RCA to Dist RCA lesion Stent (Also treats  lesions: Dist RCA) Lesion crossed with guidewire. Pre-stent angioplasty was performed. A drug-eluting stent was successfully placed. Post-stent angioplasty was performed. Post-Intervention Lesion Assessment The intervention was successful. Pre-interventional TIMI flow is 3. Post-intervention TIMI flow is 3. There is a 0% residual stenosis post intervention.  Dist RCA lesion Stent (Also treats lesions: Mid  RCA to Dist RCA) See details in Mid RCA to Dist RCA lesion. Post-Intervention Lesion Assessment The intervention was successful. Pre-interventional TIMI flow is 3. Post-intervention TIMI flow is 3. No complications occurred at this lesion. There is a 0% residual stenosis post intervention.     ECHOCARDIOGRAM  ECHOCARDIOGRAM COMPLETE 06/06/2022  Narrative ECHOCARDIOGRAM REPORT    Patient Name:   Laurie [name] Date of Exam: 06/06/2022 Medical Rec #:  [id number]          Height:       59.0 in Accession #:    [id number]         Weight:       132.1 lb Date of Birth:  08-Jan-1932          BSA:          1.546 m Patient Age:    89 years           BP:           113/63 mmHg Patient Gender: F                  HR:           59 bpm. Exam Location:  Inpatient  Procedure: 2D Echo, Cardiac Doppler and Color Doppler  Indications:    Elevated troponin  History:        Patient has prior history of Echocardiogram examinations, most recent 12/29/2021. Risk Factors:Dyslipidemia, Hypertension and Former Smoker. Hx stroke. CKD.  Sonographer:    Rome Eans RDCS (AE) Referring Phys: EDITHA RATHORE  IMPRESSIONS   1. Left ventricular ejection fraction, by estimation, is 65 to 70%. The left ventricle has normal function. The left ventricle has no regional wall motion abnormalities. There is mild left ventricular hypertrophy. Left ventricular diastolic parameters are consistent with Grade I diastolic dysfunction (impaired relaxation). 2. Right ventricular systolic function is normal. The right ventricular size is normal. 3. The mitral valve is abnormal. No evidence of mitral valve regurgitation. No evidence of mitral stenosis. 4. The aortic valve is abnormal. Aortic valve regurgitation is not visualized. No aortic stenosis is present. 5. The inferior vena cava is normal in size with greater than 50% respiratory variability, suggesting right atrial pressure of 3 mmHg.  Comparison(s): No  significant change from prior study.  FINDINGS Left Ventricle: Left ventricular ejection fraction, by estimation, is 65 to 70%. The left ventricle has normal function. The left ventricle has no regional wall motion abnormalities. The left ventricular internal cavity size was normal in size. There is mild left ventricular hypertrophy. Left ventricular diastolic parameters are consistent with Grade I diastolic dysfunction (impaired relaxation).  Right Ventricle: The right ventricular size is normal. No increase in right ventricular wall thickness. Right ventricular systolic function is normal.  Left Atrium: Left atrial size was normal in size.  Right Atrium: Right atrial size was normal in size.  Pericardium: There is no evidence of pericardial effusion.  Mitral Valve: The mitral valve is abnormal. Normal mobility of the mitral valve leaflets. Mild to moderate mitral annular calcification. No evidence of mitral valve regurgitation. No evidence of mitral valve stenosis.  Tricuspid Valve: The tricuspid valve is not  well visualized. Tricuspid valve regurgitation is not demonstrated. No evidence of tricuspid stenosis.  Aortic Valve: The aortic valve is abnormal. There is moderate aortic valve annular calcification. Aortic valve regurgitation is not visualized. No aortic stenosis is present. Aortic valve mean gradient measures 2.0 mmHg. Aortic valve peak gradient measures 3.9 mmHg. Aortic valve area, by VTI measures 2.01 cm.  Pulmonic Valve: The pulmonic valve was not well visualized. Pulmonic valve regurgitation is not visualized. No evidence of pulmonic stenosis.  Aorta: The aortic root is normal in size and structure.  Venous: The inferior vena cava is normal in size with greater than 50% respiratory variability, suggesting right atrial pressure of 3 mmHg.  IAS/Shunts: No atrial level shunt detected by color flow Doppler.   LEFT VENTRICLE PLAX 2D LVIDd:         4.20 cm    Diastology LVIDs:         2.70 cm   LV e' medial:    4.90 cm/s LV PW:         1.20 cm   LV E/e' medial:  15.9 LV IVS:        1.30 cm   LV e' lateral:   6.96 cm/s LVOT diam:     1.80 cm   LV E/e' lateral: 11.2 LV SV:         48 LV SV Index:   31 LVOT Area:     2.54 cm   RIGHT VENTRICLE             IVC RV Basal diam:  2.50 cm     IVC diam: 2.00 cm RV S prime:     14.00 cm/s TAPSE (M-mode): 2.3 cm  LEFT ATRIUM           Index        RIGHT ATRIUM          Index LA diam:      2.30 cm 1.49 cm/m   RA Area:     6.93 cm LA Vol (A2C): 17.1 ml 11.06 ml/m  RA Volume:   12.10 ml 7.83 ml/m LA Vol (A4C): 15.1 ml 9.77 ml/m AORTIC VALVE AV Area (Vmax):    2.16 cm AV Area (Vmean):   2.00 cm AV Area (VTI):     2.01 cm AV Vmax:           98.20 cm/s AV Vmean:          69.300 cm/s AV VTI:            0.238 m AV Peak Grad:      3.9 mmHg AV Mean Grad:      2.0 mmHg LVOT Vmax:         83.30 cm/s LVOT Vmean:        54.500 cm/s LVOT VTI:          0.188 m LVOT/AV VTI ratio: 0.79  AORTA Ao Root diam: 2.80 cm Ao Asc diam:  3.20 cm  MITRAL VALVE MV Area (PHT): 3.54 cm    SHUNTS MV Decel Time: 214 msec    Systemic VTI:  0.19 m MV E velocity: 78.00 cm/s  Systemic Diam: 1.80 cm MV A velocity: 99.30 cm/s MV E/A ratio:  0.79  Vishnu Priya Mallipeddi Electronically signed by Diannah Late Mallipeddi Signature Date/Time: 06/06/2022/2:49:10 PM    Final          ______________________________________________________________________________________________     Recent Labs: 10/15/2023: ALT 14 12/17/2023: BUN 52; Creatinine, Ser 1.92; Potassium 4.7; Sodium  142  Recent Lipid Panel    Component Value Date/Time   CHOL 137 10/15/2023 1002   TRIG 73 10/15/2023 1002   HDL 63 10/15/2023 1002   CHOLHDL 2.2 10/15/2023 1002   CHOLHDL 3.1 12/29/2021 1455   VLDL 23 12/29/2021 1455   LDLCALC 59 10/15/2023 1002    History of Present Illness    89 year old female with the above past medical  history including CAD s/p NSTEMI, DES-RCA in 05/2022, bilateral lower extremity edema, hypertension, hyperlipidemia, CVA, CKD stage IIIb, hiatal hernia, and former tobacco use.    She was hospitalized in 05/2022 in the setting of NSTEMI.  Cardiac catheterization revealed 99% distal RCA occlusion s/p DES, otherwise nonobstructive CAD. Echocardiogram showed EF 65 to 70%, normal LV function, no RWMA, mild LVH, G1 DD, normal RV systolic function, mild to moderate MAC.  She was last seen in the office on 10/15/2023 and was stable from a cardiac standpoint.  She denied symptoms concerning for angina.  She reported ongoing bilateral lower extremity edema.  Amlodipine  was discontinued.    She presents today for follow-up accompanied by her grandson.  Since her last visit she has been stable from a cardiac standpoint.  She reports some mildly increased shortness of breath with exertion, stable nonpitting bilateral lower extremity edema, well-controlled with Lasix . She denies chest pain.  She is fairly sedentary.  Overall, she reports feeling well.  Home Medications    Current Outpatient Medications  Medication Sig Dispense Refill   acetaminophen  (TYLENOL ) 500 MG tablet Take 1,000 mg by mouth every 6 (six) hours as needed.     aspirin  EC 81 MG tablet Take 1 tablet (81 mg total) by mouth daily. Swallow whole. 30 tablet 12   atorvastatin  (LIPITOR) 40 MG tablet Take 1 tablet (40 mg total) by mouth daily. 30 tablet 11   brimonidine (ALPHAGAN) 0.2 % ophthalmic solution Place 1 drop into both eyes 3 (three) times daily.     dorzolamide -timolol  (COSOPT ) 22.3-6.8 MG/ML ophthalmic solution 1 drop 2 (two) times daily.       furosemide  (LASIX ) 20 MG tablet Take 1 tablet (20 mg total) by mouth daily.     latanoprost  (XALATAN ) 0.005 % ophthalmic solution Place 1 drop into both eyes every evening.     memantine (NAMENDA) 5 MG tablet Take 1.5 tablets by mouth daily.     metoprolol  tartrate (LOPRESSOR ) 25 MG tablet TAKE 1/2  TABLET BY MOUTH 2 TIMES A DAY 90 tablet 1   nitroGLYCERIN  (NITROSTAT ) 0.4 MG SL tablet Place 1 tablet (0.4 mg total) under the tongue every 5 (five) minutes as needed for chest pain. 30 tablet 12   doxycycline (VIBRAMYCIN) 100 MG capsule Take 100 mg by mouth 2 (two) times daily. (Patient not taking: Reported on 08/22/2024)     ipratropium-albuterol (DUONEB) 0.5-2.5 (3) MG/3ML SOLN      No current facility-administered medications for this visit.     Review of Systems    She denies chest pain, palpitations, pnd, orthopnea, n, v, dizziness, syncope, weight gain, or early satiety. All other systems reviewed and are otherwise negative except as noted above.   Physical Exam    VS:  BP 118/62 (Cuff Size: Small)   Pulse 62   Ht 4' 11 (1.499 m)   Wt 136 lb (61.7 kg)   SpO2 97%   BMI 27.47 kg/m   GEN: Well nourished, well developed, in no acute distress. HEENT: normal. Neck: Supple, no JVD, carotid bruits, or masses. Cardiac: RRR, no  murmurs, rubs, or gallops. No clubbing, cyanosis, trace nonpitting bilateral lower extremity edema.  Radials/DP/PT 2+ and equal bilaterally.  Respiratory:  Respirations regular and unlabored, clear to auscultation bilaterally. GI: Soft, nontender, nondistended, BS + x 4. MS: no deformity or atrophy. Skin: warm and dry, no rash. Neuro:  Strength and sensation are intact. Psych: Normal affect.  Accessory Clinical Findings    ECG personally reviewed by me today - EKG Interpretation Date/Time:  Tuesday August 22 2024 09:38:50 EST Ventricular Rate:  62 PR Interval:  158 QRS Duration:  72 QT Interval:  452 QTC Calculation: 458 R Axis:   -43  Text Interpretation: Normal sinus rhythm Left axis deviation Cannot rule out Anterior infarct (cited on or before 22-Aug-2024) When compared with ECG of 13-Apr-2023 11:36, Questionable change in initial forces of Anterior leads Confirmed by Daneen Perkins ([id number]) on 08/22/2024 9:52:05 AM  - no acute changes.   Lab Results   Component Value Date   WBC 10.4 06/09/2022   HGB 12.2 06/09/2022   HCT 36.0 06/09/2022   MCV 94.5 06/09/2022   PLT 223 06/09/2022   Lab Results  Component Value Date   CREATININE 1.92 (H) 12/17/2023   BUN 52 (H) 12/17/2023   NA 142 12/17/2023   K 4.7 12/17/2023   CL 105 12/17/2023   CO2 18 (L) 12/17/2023   Lab Results  Component Value Date   ALT 14 10/15/2023   AST 19 10/15/2023   ALKPHOS 82 10/15/2023   BILITOT 0.4 10/15/2023   Lab Results  Component Value Date   CHOL 137 10/15/2023   HDL 63 10/15/2023   LDLCALC 59 10/15/2023   TRIG 73 10/15/2023   CHOLHDL 2.2 10/15/2023    Lab Results  Component Value Date   HGBA1C 5.5 12/29/2021    Assessment & Plan    1. CAD: S/p NSTEMI, DES-RCA in 05/2022.  She reports stable mild dyspnea on exertion, she denies chest pain.  Will repeat echo as below.  Continue aspirin , metoprolol , and Lipitor.   2. Hypertension: BP well controlled. Continue current antihypertensive regimen.    3. Bilateral lower extremity edema: Echo in 05/2023 showed EF 65 to 70%, normal LV function, no RWMA, mild LVH, G1 DD, normal RV systolic function, mild to moderate MAC. She reports mild dyspnea on exertion, overall stable.  She notes intermittent nonpitting bilateral lower extremity edema, well-controlled with Lasix .  Generally euvolemic and well compensated on exam.  Will update echocardiogram.  Continue Lasix .  3. Hyperlipidemia: LDL was 61 in 06/2024. Continue Lipitor.   4. History of CVA: No documented recurrence.  Continue aspirin , Lipitor.   5. CKD stage IIIb: Creatinine was 1.920 in 11/2023.  She recently had labs drawn per nephrology.  Per patient, labs were stable.   6. Disposition: Follow-up in 6 months.      Perkins JAYSON Daneen, NP 08/22/2024, 12:44 PM       [1]  Allergies Allergen Reactions   Actonel [Risedronate] Nausea And Vomiting   Fosamax [Alendronate] Nausea And Vomiting   Miacalcin Other (See Comments)    Unknown reaction    Neomycin Other (See Comments)    Unknown reaction   Neosporin [Neomycin-Polymyxin-Gramicidin] Swelling   Neurontin [Gabapentin] Other (See Comments)    Confusion   Codeine Palpitations   "

## 2024-09-20 ENCOUNTER — Ambulatory Visit (HOSPITAL_COMMUNITY)

## 2025-06-19 ENCOUNTER — Ambulatory Visit: Admitting: Adult Health
